# Patient Record
Sex: Male | Born: 1950
Health system: Southern US, Community
[De-identification: ages and names within clinical notes are randomized; demographics above are authoritative.]

## PROBLEM LIST (undated history)

## (undated) DIAGNOSIS — F32A Depression, unspecified: Secondary | ICD-10-CM

## (undated) DIAGNOSIS — I714 Abdominal aortic aneurysm, without rupture, unspecified: Secondary | ICD-10-CM

## (undated) DIAGNOSIS — M25519 Pain in unspecified shoulder: Secondary | ICD-10-CM

## (undated) DIAGNOSIS — K222 Esophageal obstruction: Secondary | ICD-10-CM

## (undated) DIAGNOSIS — I10 Essential (primary) hypertension: Secondary | ICD-10-CM

## (undated) DIAGNOSIS — R413 Other amnesia: Secondary | ICD-10-CM

## (undated) DIAGNOSIS — I712 Thoracic aortic aneurysm, without rupture, unspecified: Secondary | ICD-10-CM

## (undated) DIAGNOSIS — F4310 Post-traumatic stress disorder, unspecified: Secondary | ICD-10-CM

## (undated) DIAGNOSIS — R42 Dizziness and giddiness: Secondary | ICD-10-CM

## (undated) DIAGNOSIS — F482 Pseudobulbar affect: Secondary | ICD-10-CM

## (undated) DIAGNOSIS — G8929 Other chronic pain: Secondary | ICD-10-CM

## (undated) DIAGNOSIS — F329 Major depressive disorder, single episode, unspecified: Secondary | ICD-10-CM

## (undated) DIAGNOSIS — R0602 Shortness of breath: Secondary | ICD-10-CM

## (undated) DIAGNOSIS — E041 Nontoxic single thyroid nodule: Secondary | ICD-10-CM

## (undated) DIAGNOSIS — R2689 Other abnormalities of gait and mobility: Secondary | ICD-10-CM

## (undated) DIAGNOSIS — G5600 Carpal tunnel syndrome, unspecified upper limb: Secondary | ICD-10-CM

## (undated) DIAGNOSIS — Z8679 Personal history of other diseases of the circulatory system: Secondary | ICD-10-CM

## (undated) DIAGNOSIS — F419 Anxiety disorder, unspecified: Secondary | ICD-10-CM

## (undated) DIAGNOSIS — R51 Headache: Secondary | ICD-10-CM

## (undated) DIAGNOSIS — K922 Gastrointestinal hemorrhage, unspecified: Secondary | ICD-10-CM

## (undated) DIAGNOSIS — G4730 Sleep apnea, unspecified: Secondary | ICD-10-CM

## (undated) DIAGNOSIS — F0781 Postconcussional syndrome: Secondary | ICD-10-CM

## (undated) DIAGNOSIS — R200 Anesthesia of skin: Secondary | ICD-10-CM

## (undated) DIAGNOSIS — J189 Pneumonia, unspecified organism: Secondary | ICD-10-CM

## (undated) DIAGNOSIS — M1990 Unspecified osteoarthritis, unspecified site: Secondary | ICD-10-CM

## (undated) DIAGNOSIS — J449 Chronic obstructive pulmonary disease, unspecified: Secondary | ICD-10-CM

## (undated) DIAGNOSIS — Q211 Atrial septal defect: Secondary | ICD-10-CM

## (undated) DIAGNOSIS — R32 Unspecified urinary incontinence: Secondary | ICD-10-CM

## (undated) DIAGNOSIS — I639 Cerebral infarction, unspecified: Secondary | ICD-10-CM

## (undated) DIAGNOSIS — R202 Paresthesia of skin: Secondary | ICD-10-CM

## (undated) HISTORY — PX: CARDIAC SURGERY: SHX584

## (undated) HISTORY — PX: CARDIAC CATHETERIZATION: SHX172

## (undated) HISTORY — PX: OTHER SURGICAL HISTORY: SHX169

## (undated) HISTORY — DX: Thoracic aortic aneurysm, without rupture, unspecified: I71.20

## (undated) HISTORY — DX: Thoracic aortic aneurysm, without rupture: I71.2

## (undated) HISTORY — PX: DG GALL BLADDER: HXRAD326

---

## 1976-02-18 HISTORY — PX: VASECTOMY: SHX75

## 2000-07-14 ENCOUNTER — Emergency Department (HOSPITAL_COMMUNITY): Admission: EM | Admit: 2000-07-14 | Discharge: 2000-07-14 | Payer: Self-pay | Admitting: Emergency Medicine

## 2001-06-11 ENCOUNTER — Ambulatory Visit (HOSPITAL_COMMUNITY): Admission: RE | Admit: 2001-06-11 | Discharge: 2001-06-11 | Payer: Self-pay | Admitting: Internal Medicine

## 2001-06-21 ENCOUNTER — Ambulatory Visit (HOSPITAL_COMMUNITY): Admission: RE | Admit: 2001-06-21 | Discharge: 2001-06-21 | Payer: Self-pay | Admitting: Pulmonary Disease

## 2001-08-05 ENCOUNTER — Emergency Department (HOSPITAL_COMMUNITY): Admission: EM | Admit: 2001-08-05 | Discharge: 2001-08-05 | Payer: Self-pay | Admitting: Emergency Medicine

## 2001-12-28 ENCOUNTER — Ambulatory Visit (HOSPITAL_COMMUNITY): Admission: RE | Admit: 2001-12-28 | Discharge: 2001-12-28 | Payer: Self-pay | Admitting: Pulmonary Disease

## 2002-05-27 ENCOUNTER — Emergency Department (HOSPITAL_COMMUNITY): Admission: EM | Admit: 2002-05-27 | Discharge: 2002-05-27 | Payer: Self-pay | Admitting: Emergency Medicine

## 2002-05-27 ENCOUNTER — Encounter: Payer: Self-pay | Admitting: Emergency Medicine

## 2003-04-04 ENCOUNTER — Ambulatory Visit (HOSPITAL_COMMUNITY): Admission: RE | Admit: 2003-04-04 | Discharge: 2003-04-04 | Payer: Self-pay | Admitting: Pulmonary Disease

## 2003-04-14 ENCOUNTER — Ambulatory Visit (HOSPITAL_COMMUNITY): Admission: RE | Admit: 2003-04-14 | Discharge: 2003-04-14 | Payer: Self-pay | Admitting: *Deleted

## 2003-10-18 ENCOUNTER — Ambulatory Visit (HOSPITAL_COMMUNITY): Admission: RE | Admit: 2003-10-18 | Discharge: 2003-10-18 | Payer: Self-pay | Admitting: Unknown Physician Specialty

## 2004-03-04 ENCOUNTER — Ambulatory Visit (HOSPITAL_COMMUNITY): Admission: RE | Admit: 2004-03-04 | Discharge: 2004-03-04 | Payer: Self-pay | Admitting: Pulmonary Disease

## 2004-10-01 ENCOUNTER — Encounter: Payer: Self-pay | Admitting: Cardiology

## 2004-10-01 ENCOUNTER — Ambulatory Visit: Payer: Self-pay | Admitting: Cardiology

## 2004-10-01 ENCOUNTER — Inpatient Hospital Stay (HOSPITAL_COMMUNITY): Admission: AD | Admit: 2004-10-01 | Discharge: 2004-10-02 | Payer: Self-pay | Admitting: Cardiovascular Disease

## 2005-03-21 ENCOUNTER — Emergency Department (HOSPITAL_COMMUNITY): Admission: EM | Admit: 2005-03-21 | Discharge: 2005-03-21 | Payer: Self-pay | Admitting: Emergency Medicine

## 2005-03-21 ENCOUNTER — Inpatient Hospital Stay (HOSPITAL_COMMUNITY): Admission: RE | Admit: 2005-03-21 | Discharge: 2005-03-25 | Payer: Self-pay | Admitting: Psychiatry

## 2005-03-22 ENCOUNTER — Ambulatory Visit: Payer: Self-pay | Admitting: Psychiatry

## 2005-05-02 ENCOUNTER — Ambulatory Visit (HOSPITAL_COMMUNITY): Payer: Self-pay | Admitting: Psychology

## 2005-05-22 ENCOUNTER — Ambulatory Visit (HOSPITAL_COMMUNITY): Payer: Self-pay | Admitting: Psychology

## 2005-05-29 ENCOUNTER — Ambulatory Visit (HOSPITAL_COMMUNITY): Payer: Self-pay | Admitting: Psychology

## 2005-06-05 ENCOUNTER — Ambulatory Visit (HOSPITAL_COMMUNITY): Payer: Self-pay | Admitting: Psychology

## 2005-06-06 ENCOUNTER — Ambulatory Visit (HOSPITAL_COMMUNITY): Payer: Self-pay | Admitting: Psychiatry

## 2005-06-12 ENCOUNTER — Ambulatory Visit (HOSPITAL_COMMUNITY): Payer: Self-pay | Admitting: Psychology

## 2005-06-26 ENCOUNTER — Ambulatory Visit (HOSPITAL_COMMUNITY): Payer: Self-pay | Admitting: Psychology

## 2005-07-09 ENCOUNTER — Emergency Department (HOSPITAL_COMMUNITY): Admission: EM | Admit: 2005-07-09 | Discharge: 2005-07-09 | Payer: Self-pay | Admitting: Emergency Medicine

## 2005-07-10 ENCOUNTER — Ambulatory Visit (HOSPITAL_COMMUNITY): Payer: Self-pay | Admitting: Psychology

## 2005-07-16 ENCOUNTER — Ambulatory Visit (HOSPITAL_COMMUNITY): Payer: Self-pay | Admitting: Psychology

## 2005-07-21 ENCOUNTER — Emergency Department (HOSPITAL_COMMUNITY): Admission: EM | Admit: 2005-07-21 | Discharge: 2005-07-21 | Payer: Self-pay | Admitting: Emergency Medicine

## 2005-07-22 ENCOUNTER — Ambulatory Visit (HOSPITAL_COMMUNITY): Payer: Self-pay | Admitting: Psychology

## 2005-08-01 ENCOUNTER — Ambulatory Visit (HOSPITAL_COMMUNITY): Payer: Self-pay | Admitting: Psychology

## 2005-08-07 ENCOUNTER — Ambulatory Visit (HOSPITAL_COMMUNITY): Payer: Self-pay | Admitting: Psychology

## 2005-08-29 ENCOUNTER — Ambulatory Visit (HOSPITAL_COMMUNITY): Payer: Self-pay | Admitting: Psychiatry

## 2005-09-03 ENCOUNTER — Ambulatory Visit (HOSPITAL_COMMUNITY): Payer: Self-pay | Admitting: Psychology

## 2005-09-24 ENCOUNTER — Ambulatory Visit (HOSPITAL_COMMUNITY): Payer: Self-pay | Admitting: Psychology

## 2005-10-14 ENCOUNTER — Ambulatory Visit (HOSPITAL_COMMUNITY): Payer: Self-pay | Admitting: Psychology

## 2005-10-23 ENCOUNTER — Ambulatory Visit (HOSPITAL_COMMUNITY): Payer: Self-pay | Admitting: Psychology

## 2005-11-24 ENCOUNTER — Ambulatory Visit (HOSPITAL_COMMUNITY): Payer: Self-pay | Admitting: Psychology

## 2005-12-10 ENCOUNTER — Ambulatory Visit (HOSPITAL_COMMUNITY): Payer: Self-pay | Admitting: Psychology

## 2006-01-06 ENCOUNTER — Ambulatory Visit (HOSPITAL_COMMUNITY): Payer: Self-pay | Admitting: Psychology

## 2006-03-05 ENCOUNTER — Ambulatory Visit (HOSPITAL_COMMUNITY): Payer: Self-pay | Admitting: Psychology

## 2006-03-25 ENCOUNTER — Ambulatory Visit: Payer: Self-pay | Admitting: Orthopedic Surgery

## 2006-03-26 ENCOUNTER — Ambulatory Visit (HOSPITAL_COMMUNITY): Admission: RE | Admit: 2006-03-26 | Discharge: 2006-03-26 | Payer: Self-pay | Admitting: Orthopedic Surgery

## 2006-03-27 ENCOUNTER — Ambulatory Visit (HOSPITAL_COMMUNITY): Payer: Self-pay | Admitting: Psychiatry

## 2006-07-24 ENCOUNTER — Ambulatory Visit: Payer: Self-pay | Admitting: Internal Medicine

## 2006-07-24 ENCOUNTER — Encounter (INDEPENDENT_AMBULATORY_CARE_PROVIDER_SITE_OTHER): Payer: Self-pay | Admitting: Internal Medicine

## 2006-07-24 ENCOUNTER — Ambulatory Visit (HOSPITAL_COMMUNITY): Admission: RE | Admit: 2006-07-24 | Discharge: 2006-07-24 | Payer: Self-pay | Admitting: Internal Medicine

## 2006-07-24 HISTORY — PX: COLONOSCOPY: SHX174

## 2007-01-04 ENCOUNTER — Inpatient Hospital Stay (HOSPITAL_COMMUNITY): Admission: EM | Admit: 2007-01-04 | Discharge: 2007-01-06 | Payer: Self-pay | Admitting: Emergency Medicine

## 2007-01-04 ENCOUNTER — Ambulatory Visit: Payer: Self-pay | Admitting: Cardiology

## 2007-01-05 ENCOUNTER — Encounter (INDEPENDENT_AMBULATORY_CARE_PROVIDER_SITE_OTHER): Payer: Self-pay | Admitting: Pulmonary Disease

## 2007-02-18 DIAGNOSIS — K922 Gastrointestinal hemorrhage, unspecified: Secondary | ICD-10-CM

## 2007-02-18 HISTORY — DX: Gastrointestinal hemorrhage, unspecified: K92.2

## 2007-03-08 ENCOUNTER — Ambulatory Visit (HOSPITAL_COMMUNITY): Admission: RE | Admit: 2007-03-08 | Discharge: 2007-03-08 | Payer: Self-pay | Admitting: Pulmonary Disease

## 2007-03-15 ENCOUNTER — Ambulatory Visit (HOSPITAL_COMMUNITY): Admission: RE | Admit: 2007-03-15 | Discharge: 2007-03-15 | Payer: Self-pay | Admitting: Pulmonary Disease

## 2007-03-15 ENCOUNTER — Encounter (INDEPENDENT_AMBULATORY_CARE_PROVIDER_SITE_OTHER): Payer: Self-pay | Admitting: Diagnostic Radiology

## 2007-05-16 ENCOUNTER — Emergency Department (HOSPITAL_COMMUNITY): Admission: EM | Admit: 2007-05-16 | Discharge: 2007-05-16 | Payer: Self-pay | Admitting: Emergency Medicine

## 2007-08-12 ENCOUNTER — Ambulatory Visit (HOSPITAL_COMMUNITY): Admission: RE | Admit: 2007-08-12 | Discharge: 2007-08-12 | Payer: Self-pay | Admitting: Endocrinology

## 2007-10-15 ENCOUNTER — Encounter: Payer: Self-pay | Admitting: Internal Medicine

## 2007-10-15 ENCOUNTER — Ambulatory Visit: Payer: Self-pay | Admitting: Internal Medicine

## 2007-10-15 ENCOUNTER — Ambulatory Visit (HOSPITAL_COMMUNITY): Admission: RE | Admit: 2007-10-15 | Discharge: 2007-10-15 | Payer: Self-pay | Admitting: Internal Medicine

## 2007-10-15 HISTORY — PX: COLONOSCOPY: SHX174

## 2007-11-09 ENCOUNTER — Ambulatory Visit: Payer: Self-pay | Admitting: Internal Medicine

## 2007-12-15 ENCOUNTER — Ambulatory Visit (HOSPITAL_COMMUNITY): Admission: RE | Admit: 2007-12-15 | Discharge: 2007-12-15 | Payer: Self-pay | Admitting: Orthopedic Surgery

## 2007-12-20 ENCOUNTER — Ambulatory Visit: Payer: Self-pay | Admitting: Orthopedic Surgery

## 2007-12-20 DIAGNOSIS — M4712 Other spondylosis with myelopathy, cervical region: Secondary | ICD-10-CM | POA: Insufficient documentation

## 2007-12-21 ENCOUNTER — Telehealth: Payer: Self-pay | Admitting: Orthopedic Surgery

## 2007-12-22 ENCOUNTER — Telehealth: Payer: Self-pay | Admitting: Orthopedic Surgery

## 2007-12-23 ENCOUNTER — Telehealth: Payer: Self-pay | Admitting: Orthopedic Surgery

## 2007-12-27 ENCOUNTER — Telehealth: Payer: Self-pay | Admitting: Orthopedic Surgery

## 2007-12-29 ENCOUNTER — Telehealth: Payer: Self-pay | Admitting: Orthopedic Surgery

## 2008-01-03 ENCOUNTER — Encounter: Admission: RE | Admit: 2008-01-03 | Discharge: 2008-01-03 | Payer: Self-pay | Admitting: Orthopedic Surgery

## 2008-01-28 ENCOUNTER — Encounter: Admission: RE | Admit: 2008-01-28 | Discharge: 2008-01-28 | Payer: Self-pay | Admitting: Orthopedic Surgery

## 2008-02-07 ENCOUNTER — Ambulatory Visit (HOSPITAL_COMMUNITY): Admission: RE | Admit: 2008-02-07 | Discharge: 2008-02-07 | Payer: Self-pay | Admitting: Endocrinology

## 2008-02-14 ENCOUNTER — Inpatient Hospital Stay (HOSPITAL_COMMUNITY): Admission: EM | Admit: 2008-02-14 | Discharge: 2008-02-16 | Payer: Self-pay | Admitting: Emergency Medicine

## 2008-02-14 ENCOUNTER — Ambulatory Visit: Payer: Self-pay | Admitting: Cardiology

## 2008-02-14 ENCOUNTER — Encounter: Payer: Self-pay | Admitting: Emergency Medicine

## 2008-02-15 ENCOUNTER — Encounter (INDEPENDENT_AMBULATORY_CARE_PROVIDER_SITE_OTHER): Payer: Self-pay | Admitting: Emergency Medicine

## 2008-02-18 DIAGNOSIS — Q211 Atrial septal defect: Secondary | ICD-10-CM

## 2008-02-18 DIAGNOSIS — Q2112 Patent foramen ovale: Secondary | ICD-10-CM

## 2008-02-18 HISTORY — DX: Atrial septal defect: Q21.1

## 2008-02-18 HISTORY — PX: CARDIAC CATHETERIZATION: SHX172

## 2008-02-18 HISTORY — PX: OTHER SURGICAL HISTORY: SHX169

## 2008-02-18 HISTORY — DX: Patent foramen ovale: Q21.12

## 2008-03-16 ENCOUNTER — Encounter (INDEPENDENT_AMBULATORY_CARE_PROVIDER_SITE_OTHER): Payer: Self-pay | Admitting: Pulmonary Disease

## 2008-03-16 ENCOUNTER — Ambulatory Visit: Payer: Self-pay | Admitting: Surgery

## 2008-03-16 ENCOUNTER — Ambulatory Visit: Admission: RE | Admit: 2008-03-16 | Discharge: 2008-03-16 | Payer: Self-pay | Admitting: Pulmonary Disease

## 2008-04-03 ENCOUNTER — Ambulatory Visit: Payer: Self-pay | Admitting: Cardiovascular Disease

## 2008-04-28 ENCOUNTER — Ambulatory Visit (HOSPITAL_COMMUNITY): Admission: RE | Admit: 2008-04-28 | Discharge: 2008-04-28 | Payer: Self-pay | Admitting: Cardiology

## 2008-04-28 ENCOUNTER — Encounter (INDEPENDENT_AMBULATORY_CARE_PROVIDER_SITE_OTHER): Payer: Self-pay | Admitting: Neurology

## 2008-06-07 DIAGNOSIS — G43909 Migraine, unspecified, not intractable, without status migrainosus: Secondary | ICD-10-CM | POA: Insufficient documentation

## 2008-06-07 DIAGNOSIS — F3289 Other specified depressive episodes: Secondary | ICD-10-CM | POA: Insufficient documentation

## 2008-06-07 DIAGNOSIS — F4310 Post-traumatic stress disorder, unspecified: Secondary | ICD-10-CM | POA: Insufficient documentation

## 2008-06-07 DIAGNOSIS — E669 Obesity, unspecified: Secondary | ICD-10-CM | POA: Insufficient documentation

## 2008-06-07 DIAGNOSIS — F329 Major depressive disorder, single episode, unspecified: Secondary | ICD-10-CM | POA: Insufficient documentation

## 2008-06-07 DIAGNOSIS — E785 Hyperlipidemia, unspecified: Secondary | ICD-10-CM | POA: Insufficient documentation

## 2008-06-07 DIAGNOSIS — G5600 Carpal tunnel syndrome, unspecified upper limb: Secondary | ICD-10-CM | POA: Insufficient documentation

## 2008-06-07 DIAGNOSIS — I6350 Cerebral infarction due to unspecified occlusion or stenosis of unspecified cerebral artery: Secondary | ICD-10-CM | POA: Insufficient documentation

## 2008-06-07 DIAGNOSIS — I10 Essential (primary) hypertension: Secondary | ICD-10-CM | POA: Insufficient documentation

## 2008-06-07 DIAGNOSIS — F429 Obsessive-compulsive disorder, unspecified: Secondary | ICD-10-CM | POA: Insufficient documentation

## 2008-06-07 DIAGNOSIS — F528 Other sexual dysfunction not due to a substance or known physiological condition: Secondary | ICD-10-CM | POA: Insufficient documentation

## 2008-06-12 ENCOUNTER — Ambulatory Visit: Payer: Self-pay | Admitting: Cardiovascular Disease

## 2008-06-12 DIAGNOSIS — Q2111 Secundum atrial septal defect: Secondary | ICD-10-CM | POA: Insufficient documentation

## 2008-06-12 DIAGNOSIS — Q211 Atrial septal defect: Secondary | ICD-10-CM | POA: Insufficient documentation

## 2008-06-13 ENCOUNTER — Encounter: Payer: Self-pay | Admitting: Cardiovascular Disease

## 2008-06-19 ENCOUNTER — Encounter: Payer: Self-pay | Admitting: Emergency Medicine

## 2008-06-19 ENCOUNTER — Inpatient Hospital Stay (HOSPITAL_COMMUNITY): Admission: EM | Admit: 2008-06-19 | Discharge: 2008-06-21 | Payer: Self-pay | Admitting: Pediatrics

## 2008-07-11 ENCOUNTER — Encounter: Payer: Self-pay | Admitting: Cardiovascular Disease

## 2008-07-24 ENCOUNTER — Encounter: Payer: Self-pay | Admitting: Cardiovascular Disease

## 2008-08-01 ENCOUNTER — Encounter (HOSPITAL_COMMUNITY): Admission: RE | Admit: 2008-08-01 | Discharge: 2008-08-31 | Payer: Self-pay | Admitting: Neurology

## 2008-08-23 ENCOUNTER — Encounter: Payer: Self-pay | Admitting: Cardiovascular Disease

## 2008-09-05 ENCOUNTER — Encounter (HOSPITAL_COMMUNITY): Admission: RE | Admit: 2008-09-05 | Discharge: 2008-10-05 | Payer: Self-pay | Admitting: Neurology

## 2008-09-12 ENCOUNTER — Encounter: Payer: Self-pay | Admitting: Cardiovascular Disease

## 2008-09-14 ENCOUNTER — Encounter: Payer: Self-pay | Admitting: Cardiovascular Disease

## 2008-10-20 ENCOUNTER — Ambulatory Visit: Admission: RE | Admit: 2008-10-20 | Discharge: 2008-10-20 | Payer: Self-pay | Admitting: Pulmonary Disease

## 2008-11-01 ENCOUNTER — Encounter (HOSPITAL_COMMUNITY): Admission: RE | Admit: 2008-11-01 | Discharge: 2008-11-16 | Payer: Self-pay | Admitting: Pulmonary Disease

## 2008-11-20 ENCOUNTER — Encounter (HOSPITAL_COMMUNITY): Admission: RE | Admit: 2008-11-20 | Discharge: 2008-12-20 | Payer: Self-pay | Admitting: Pulmonary Disease

## 2008-12-22 ENCOUNTER — Ambulatory Visit (HOSPITAL_COMMUNITY): Admission: RE | Admit: 2008-12-22 | Discharge: 2008-12-22 | Payer: Self-pay | Admitting: Endocrinology

## 2009-01-23 ENCOUNTER — Encounter: Payer: Self-pay | Admitting: Cardiovascular Disease

## 2009-01-30 ENCOUNTER — Encounter: Payer: Self-pay | Admitting: Cardiovascular Disease

## 2009-02-05 ENCOUNTER — Encounter: Payer: Self-pay | Admitting: Cardiovascular Disease

## 2009-02-07 ENCOUNTER — Encounter: Payer: Self-pay | Admitting: Cardiovascular Disease

## 2009-03-15 ENCOUNTER — Encounter: Payer: Self-pay | Admitting: Cardiovascular Disease

## 2009-04-23 ENCOUNTER — Emergency Department (HOSPITAL_COMMUNITY): Admission: EM | Admit: 2009-04-23 | Discharge: 2009-04-23 | Payer: Self-pay | Admitting: Emergency Medicine

## 2009-07-17 ENCOUNTER — Ambulatory Visit (HOSPITAL_COMMUNITY): Admission: RE | Admit: 2009-07-17 | Discharge: 2009-07-17 | Payer: Self-pay | Admitting: Endocrinology

## 2009-07-19 ENCOUNTER — Other Ambulatory Visit: Admission: RE | Admit: 2009-07-19 | Discharge: 2009-07-19 | Payer: Self-pay | Admitting: Interventional Radiology

## 2009-07-19 ENCOUNTER — Encounter: Admission: RE | Admit: 2009-07-19 | Discharge: 2009-07-19 | Payer: Self-pay | Admitting: Endocrinology

## 2009-08-21 ENCOUNTER — Encounter: Payer: Self-pay | Admitting: Cardiovascular Disease

## 2009-09-17 ENCOUNTER — Encounter: Payer: Self-pay | Admitting: Cardiovascular Disease

## 2009-10-18 ENCOUNTER — Observation Stay (HOSPITAL_COMMUNITY): Admission: EM | Admit: 2009-10-18 | Discharge: 2009-10-20 | Payer: Self-pay | Admitting: Emergency Medicine

## 2010-02-26 ENCOUNTER — Encounter: Payer: Self-pay | Admitting: Cardiovascular Disease

## 2010-03-10 ENCOUNTER — Encounter: Payer: Self-pay | Admitting: Orthopedic Surgery

## 2010-03-19 NOTE — Letter (Signed)
Summary: [facility] Office Note   [facility] Neurology Office Note   Imported By: Roderic Ovens 09/25/2009 12:48:14  _____________________________________________________________________  External Attachment:    Type:   Image     Comment:   External Document

## 2010-03-19 NOTE — Miscellaneous (Signed)
Summary: Clinic Note Cardiovascular  Clinic Note Cardiovascular   Imported By: Kassie Mends 11/07/2008 10:16:30  _____________________________________________________________________  External Attachment:    Type:   Image     Comment:   External Document

## 2010-03-19 NOTE — Letter (Signed)
Summary: [facility] Clinic Note  [facility] Note   Imported By: Roderic Ovens 04/03/2009 14:01:53  _____________________________________________________________________  External Attachment:    Type:   Image     Comment:   External Document

## 2010-03-19 NOTE — Progress Notes (Signed)
Summary: Neurolgy Interval Note  Neurolgy Interval Note   Imported By: Harlon Flor 01/30/2009 09:39:11  _____________________________________________________________________  External Attachment:    Type:   Image     Comment:   External Document

## 2010-03-19 NOTE — Progress Notes (Signed)
Summary: Patient called with several questions  Phone Note Call from Patient   Caller: Patient Call For: Dr. Romeo Apple Summary of Call: Patient called re: (1) ESI appt, which he cancelled for today, due to illness (flu); states has not rescheduled yet     (2)wanted to relay that he has not yet started taking steroid dose pack, due to illness     (3)asking if Dr Romeo Apple can write a letter excusing him from the H1N1 vaccine for [facility] employee health to consider exemption  DR Areebah Meinders Please advise on above.     (4)His [facility] appointment information which I had left him a msg about - he states he is scheduled to work 11/25/09PM. I will follow up with him on this matter Initial call taken by: Cammie Sickle,  December 27, 2007 10:29 AM  Follow-up for Phone Call        must ask primary care for that   (fyi: we do not handle any medical issues)  Follow-up by: Fuller Canada MD,  December 27, 2007 1:09 PM  Additional Follow-up for Phone Call Additional follow up Details #1::        I lft msg's as follows: Pt's home and pt's work# [phone] [facility] re: resched of pt's appt w/Dr Channing Mutters Additional Follow-up by: Cammie Sickle,  December 27, 2007 4:15 PM    Additional Follow-up for Phone Call Additional follow up Details #2::    Pt returned call. Advised re: fol up w/PCP re: medical issues.    RE:  [facility] neuro appt that pt states needs to re-sched:  Per Jasmine December, she will give to Dr Roy's nurse, who will get back w/us about another appointment.  Patient informed. Follow-up by: Cammie Sickle,  December 28, 2007 10:25 AM

## 2010-03-19 NOTE — Consult Note (Signed)
Summary: [facility] - NEUROLOGY  [facility]   Imported By: Marylou Mccoy 02/28/2009 18:39:09  _____________________________________________________________________  External Attachment:    Type:   Image     Comment:   External Document

## 2010-03-19 NOTE — Progress Notes (Signed)
Summary: question about starting the predisone  Phone Note Call from Patient   Summary of Call: Rocky Link Luft's (2051-01-24) wife, Clydie Braun called and said that Rocky Link started with a sore throat and very congested last night, not running a fever yet.  Rocky Link has not started the predisone yet and did not know if. he should start it now while he is so congested.  Also, schedueled for the injections at [facility] Monday 12/27/07.  Do you want him to start the predisone and do you think he should reschedule the first injection? His # is [phone] Initial call taken by: Jacklynn Ganong,  December 22, 2007 10:51 AM  Follow-up for Phone Call        take the prednisone and doesnt need to reschedule the injection  Follow-up by: Fuller Canada MD,  December 22, 2007 11:52 AM  Additional Follow-up for Phone Call Additional follow up Details #1::        Phone Call Completed Additional Follow-up by: Jacklynn Ganong,  December 22, 2007 2:02 PM

## 2010-03-19 NOTE — Letter (Signed)
Summary: [facility] Interval Note   [facility] Neurology Interval Note   Imported By: Roderic Ovens 08/31/2009 12:58:10  _____________________________________________________________________  External Attachment:    Type:   Image     Comment:   External Document

## 2010-03-19 NOTE — Letter (Signed)
Summary: [facility] Office Visit  [facility] Office Visit   Imported By: Roderic Ovens 03/13/2009 14:33:06  _____________________________________________________________________  External Attachment:    Type:   Image     Comment:   External Document

## 2010-03-19 NOTE — Letter (Signed)
Summary: [facility]  [facility]   Imported By: Marylou Mccoy 02/28/2009 18:41:12  _____________________________________________________________________  External Attachment:    Type:   Image     Comment:   External Document

## 2010-03-19 NOTE — Assessment & Plan Note (Signed)
Summary: pt has had some testing done and wants to go over all testing...   CC:   pt comlians of fatigue dizziness and pt recently had a stroke pt states dr Eden Emms did an cath in his past.  History of Present Illness: [name] is seen today as a second opinion regarding multiple cerebral infarcts and a patent foramen ovale.  It took me quite a while to review records from Dr. Pearlean Brownie he had a right occipital and bilateral cerebellar infarction February of 2009.  He had had a previous history in November of 2000 date of her lobe infarct.  He has hypertension hyperlipidemia.  Transthoracic echo showed a normal EF with no significant valvular heart disease or source of embolus.  Transcranial Doppler study performed 2 2610 was reviewed.  The TCD bubble study was positive with a large intracardiac right-to-left shunt. this was accentuated by Valsalva.  He subsequently was referred for transesophageal echocardiography by Dr. Jacinto Halim.  the patient was quite upset regarding this procedure.  He said there were conflicting views regarding it.  He also said the report had to be redone and that Dr. Karsten Fells actually perform the procedure.  He said that he was told that there was a hole in his heart that period between 6 mm and 1.5 cm.  Actually looked at the transesophageal echocardiogram that was performed on April 28, 2008.  The patient and his wife eat with me.  The patient has a relatively small patent foramen ovale.  It is clearly present by color flow.  The study is positive for right to left shunting.  There is no other cardiac pathology.  There is no other source of embolus.  I could not measure on-screen calipers but it appeared to be only 0.5-0.7 mm.  There is clearly no ASD.  However in the setting of multiple random cerebral infarction TIAs despite being on aspirin and Plavix, I would agree that the patient probably should have his patent foramen closed percutaneously.  The patient does not feel comfortable with Dr.  Jacinto Halim.  my understanding that he is leaving [facility] anyway. in the [employer] we usually referred these people to [facility].  Dr. Earma Reading and Romeo Apple have over 10 years experience with occluder devices.  I don't think this has to be done under a research protocol.  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 1 Tab By Mouth Once Daily 3)  Aggrenox 25-200 Mg Xr12h-Cap (Aspirin-Dipyridamole) .Marland Kitchen.. 1 Tab By Mouth Two Times A Day 4)  Centrum Silver .Marland Kitchen.. 1 Tab By Mouth Once Daily 5)  Xanax .5 Mg .... As Needed  Allergies (verified): No Known Drug Allergies  Past History:  Past Medical History:    Current Problems:     HYPERTENSION (ICD-401.9)    CVA (ICD-434.91)    MIGRAINE, CHRONIC (ICD-346.90)    DEPRESSION (ICD-311)    CARPAL TUNNEL SYNDROME (ICD-354.0)    ERECTILE DYSFUNCTION (ICD-302.72)    OBESITY (ICD-278.00)    DYSLIPIDEMIA (ICD-272.4)    CERVICAL SPONDYLOSIS WITH  MYELOPATHY (ICD-721.1)    PTSD (ICD-309.81)    OBSESSIVE-COMPULSIVE DISORDER (ICD-300.3)    History of previous stroke.     history of CVA with left-sided mild residual symptoms. No deformities. Started having still on Plavix.    Gastrointestinal bleed in summer of 2009     He has had precancerous lesions in his colon.      (06/07/2008)  Past Surgical History:    Neck injections for spondylosis    colonoscopy     Cerebral angiogram performed at [facility]    3 cardiac caths    2   colonoscopies         precancerous polyps removed.  (06/07/2008)  Family History:    Mother died in her 53s of congestive heart failure.      Father died in his 69s of cerebrovascular disease.  Brother died in his      71s of myocardial infarction.  Another brother died in 57s of lung      cancer.  He has two sisters with cardiac disease.        (06/07/2008)  Social History:    He is a Building control surveyor at [employer].     Tobacco Use - No.     Alcohol Use - no     (06/07/2008)  Social History:    He is a Building control surveyor at [employer].     Tobacco Use - No.     Alcohol Use - no    Currently on disability      Review of Systems       Denies fever, malais, weight loss, blurry vision, decreased visual acuity, cough, sputum, SOB, hemoptysis, pleuritic pain, palpitaitons, heartburn, abdominal pain, melena, lower extremity edema, claudication, or rash.   Vital Signs:  Patient profile:   60 year old male Height:      73 inches Weight:      206 pounds Pulse rate:   57 / minute Resp:     14 per minute BP sitting:   128 / 70  (left arm)  Vitals Entered By: Kem Parkinson (June 12, 2008 11:53 AM)  Physical Exam  General:  Affect appropriate Healthy:  appears stated age HEENT: normal Neck supple with no adenopathy JVP normal no bruits no thyromegaly Lungs clear with no wheezing and good diaphragmatic motion Heart:  S1/S2 no murmur,rub, gallop or click PMI normal Abdomen: benighn, BS positve, no tenderness, no AAA no bruit.  No HSM or HJR Distal pulses intact with no bruits No edema Neuro non-focal Skin warm and dry    Impression & Recommendations:  Problem # 1:  PATENT FORAMEN OVALE (ICD-745.5) Reviewed records and TEE with patient.  Personally discussed case with [facility] and Dr. Earma Reading.  Continue ASA and Plavix.  Refer for percutaneous closure.    Problem # 2:  HYPERTENSION (ICD-401.9) Well controlled low sodium diet  Problem # 3:  DEPRESSION (ICD-311) Previous  psych evaluation with suicidal ideation.  Seems improved.  F/U Psychiatry  Problem # 4:  ERECTILE DYSFUNCTION (ICD-302.72) Small vessel disease with depression.  Followed by Dr. Juanetta Gosling.  No contraindication to viagra at this time  Other Orders: Misc. Referral (Misc. Ref)  Patient Instructions: 1)  Refer to [facility] for PFO closure  Appended Document: pt has had some testing done and wants to go over all testing... ECG:  Sinus bradycardia 57  Possible LAE. Borderline ECG

## 2010-04-10 NOTE — Letter (Signed)
Summary: [facility] Interval Note  [facility] - Neurology Interval Note   Imported By: Marylou Mccoy 04/01/2010 12:49:18  _____________________________________________________________________  External Attachment:    Type:   Image     Comment:   External Document

## 2010-05-02 LAB — DIFFERENTIAL
Basophils Absolute: 0 10*3/uL (ref 0.0–0.1)
Basophils Relative: 1 % (ref 0–1)
Eosinophils Absolute: 0.2 10*3/uL (ref 0.0–0.7)
Eosinophils Relative: 3 % (ref 0–5)
Lymphocytes Relative: 23 % (ref 12–46)
Lymphs Abs: 1.8 10*3/uL (ref 0.7–4.0)
Monocytes Absolute: 0.6 10*3/uL (ref 0.1–1.0)
Monocytes Relative: 8 % (ref 3–12)
Neutro Abs: 5.3 10*3/uL (ref 1.7–7.7)
Neutrophils Relative %: 66 % (ref 43–77)

## 2010-05-02 LAB — CBC
HCT: 44.5 % (ref 39.0–52.0)
Hemoglobin: 14.8 g/dL (ref 13.0–17.0)
MCH: 32.4 pg (ref 26.0–34.0)
MCHC: 33.3 g/dL (ref 30.0–36.0)
MCV: 97.4 fL (ref 78.0–100.0)
Platelets: 201 10*3/uL (ref 150–400)
RBC: 4.56 MIL/uL (ref 4.22–5.81)
RDW: 13.3 % (ref 11.5–15.5)
WBC: 8 10*3/uL (ref 4.0–10.5)

## 2010-05-02 LAB — CARDIAC PANEL(CRET KIN+CKTOT+MB+TROPI)
CK, MB: 2.3 ng/mL (ref 0.3–4.0)
CK, MB: 2.8 ng/mL (ref 0.3–4.0)
Relative Index: 2.3 (ref 0.0–2.5)
Relative Index: 2.5 (ref 0.0–2.5)
Total CK: 100 U/L (ref 7–232)
Total CK: 111 U/L (ref 7–232)
Troponin I: 0.03 ng/mL (ref 0.00–0.06)
Troponin I: 0.06 ng/mL (ref 0.00–0.06)

## 2010-05-02 LAB — COMPREHENSIVE METABOLIC PANEL
ALT: 34 U/L (ref 0–53)
AST: 28 U/L (ref 0–37)
Albumin: 4 g/dL (ref 3.5–5.2)
Alkaline Phosphatase: 64 U/L (ref 39–117)
BUN: 18 mg/dL (ref 6–23)
CO2: 27 mEq/L (ref 19–32)
Calcium: 9.6 mg/dL (ref 8.4–10.5)
Chloride: 108 mEq/L (ref 96–112)
Creatinine, Ser: 0.84 mg/dL (ref 0.4–1.5)
GFR calc Af Amer: >60 mL/min (ref >60)
GFR calc non Af Amer: >60 mL/min (ref >60)
Glucose, Bld: 121 mg/dL — ABNORMAL HIGH (ref 70–99)
Potassium: 4 mEq/L (ref 3.5–5.1)
Sodium: 141 mEq/L (ref 135–145)
Total Bilirubin: 0.9 mg/dL (ref 0.3–1.2)
Total Protein: 6.6 g/dL (ref 6.0–8.3)

## 2010-05-02 LAB — POCT CARDIAC MARKERS
CKMB, poc: 1.9 ng/mL (ref 1.0–8.0)
Myoglobin, poc: 91.2 ng/mL (ref 12–200)
Troponin i, poc: <0.05 ng/mL (ref 0.00–0.09)

## 2010-05-10 LAB — BASIC METABOLIC PANEL
BUN: 19 mg/dL (ref 6–23)
CO2: 27 mEq/L (ref 19–32)
Calcium: 9.3 mg/dL (ref 8.4–10.5)
Chloride: 106 mEq/L (ref 96–112)
Creatinine, Ser: 0.79 mg/dL (ref 0.4–1.5)
GFR calc Af Amer: >60 mL/min (ref >60)
GFR calc non Af Amer: >60 mL/min (ref >60)
Glucose, Bld: 111 mg/dL — ABNORMAL HIGH (ref 70–99)
Potassium: 4 mEq/L (ref 3.5–5.1)
Sodium: 140 mEq/L (ref 135–145)

## 2010-05-10 LAB — URINALYSIS, ROUTINE W REFLEX MICROSCOPIC
Bilirubin Urine: NEGATIVE
Glucose, UA: NEGATIVE mg/dL
Hgb urine dipstick: NEGATIVE
Ketones, ur: NEGATIVE mg/dL
Nitrite: NEGATIVE
Protein, ur: NEGATIVE mg/dL
Specific Gravity, Urine: 1.01 (ref 1.005–1.030)
Urobilinogen, UA: 0.2 mg/dL (ref 0.0–1.0)
pH: 6.5 (ref 5.0–8.0)

## 2010-05-10 LAB — DIFFERENTIAL
Basophils Absolute: 0 10*3/uL (ref 0.0–0.1)
Basophils Relative: 1 % (ref 0–1)
Eosinophils Absolute: 0.1 10*3/uL (ref 0.0–0.7)
Eosinophils Relative: 2 % (ref 0–5)
Lymphocytes Relative: 19 % (ref 12–46)
Lymphs Abs: 1.2 10*3/uL (ref 0.7–4.0)
Monocytes Absolute: 0.6 10*3/uL (ref 0.1–1.0)
Monocytes Relative: 10 % (ref 3–12)
Neutro Abs: 4.3 10*3/uL (ref 1.7–7.7)
Neutrophils Relative %: 69 % (ref 43–77)

## 2010-05-10 LAB — CBC
HCT: 42.5 % (ref 39.0–52.0)
Hemoglobin: 14.8 g/dL (ref 13.0–17.0)
MCHC: 34.7 g/dL (ref 30.0–36.0)
MCV: 97.1 fL (ref 78.0–100.0)
Platelets: 176 10*3/uL (ref 150–400)
RBC: 4.38 MIL/uL (ref 4.22–5.81)
RDW: 12.3 % (ref 11.5–15.5)
WBC: 6.2 10*3/uL (ref 4.0–10.5)

## 2010-05-10 LAB — PROTIME-INR
INR: 1.03 (ref 0.00–1.49)
Prothrombin Time: 13.4 seconds (ref 11.6–15.2)

## 2010-05-10 LAB — POCT CARDIAC MARKERS
CKMB, poc: <1 ng/mL — ABNORMAL LOW (ref 1.0–8.0)
Myoglobin, poc: 106 ng/mL (ref 12–200)
Troponin i, poc: <0.05 ng/mL (ref 0.00–0.09)

## 2010-05-28 LAB — CBC
HCT: 43.5 % (ref 39.0–52.0)
HCT: 41.4 % (ref 39.0–52.0)
Hemoglobin: 15.2 g/dL (ref 13.0–17.0)
Hemoglobin: 14.3 g/dL (ref 13.0–17.0)
MCHC: 34.9 g/dL (ref 30.0–36.0)
MCHC: 34.6 g/dL (ref 30.0–36.0)
MCV: 98.1 fL (ref 78.0–100.0)
MCV: 98.3 fL (ref 78.0–100.0)
Platelets: 212 10*3/uL (ref 150–400)
Platelets: 194 10*3/uL (ref 150–400)
RBC: 4.44 MIL/uL (ref 4.22–5.81)
RBC: 4.21 MIL/uL — ABNORMAL LOW (ref 4.22–5.81)
RDW: 12.6 % (ref 11.5–15.5)
RDW: 12.3 % (ref 11.5–15.5)
WBC: 6.5 10*3/uL (ref 4.0–10.5)
WBC: 5.9 10*3/uL (ref 4.0–10.5)

## 2010-05-28 LAB — COMPREHENSIVE METABOLIC PANEL
ALT: 32 U/L (ref 0–53)
ALT: 32 U/L (ref 0–53)
AST: 27 U/L (ref 0–37)
AST: 25 U/L (ref 0–37)
Albumin: 4.3 g/dL (ref 3.5–5.2)
Albumin: 3.8 g/dL (ref 3.5–5.2)
Alkaline Phosphatase: 63 U/L (ref 39–117)
Alkaline Phosphatase: 62 U/L (ref 39–117)
BUN: 21 mg/dL (ref 6–23)
BUN: 19 mg/dL (ref 6–23)
CO2: 27 mEq/L (ref 19–32)
CO2: 28 mEq/L (ref 19–32)
Calcium: 9.3 mg/dL (ref 8.4–10.5)
Calcium: 9.1 mg/dL (ref 8.4–10.5)
Chloride: 102 mEq/L (ref 96–112)
Chloride: 107 mEq/L (ref 96–112)
Creatinine, Ser: 0.79 mg/dL (ref 0.4–1.5)
Creatinine, Ser: 0.83 mg/dL (ref 0.4–1.5)
GFR calc Af Amer: >60 mL/min (ref >60)
GFR calc Af Amer: >60 mL/min (ref >60)
GFR calc non Af Amer: >60 mL/min (ref >60)
GFR calc non Af Amer: >60 mL/min (ref >60)
Glucose, Bld: 89 mg/dL (ref 70–99)
Glucose, Bld: 94 mg/dL (ref 70–99)
Potassium: 4.2 mEq/L (ref 3.5–5.1)
Potassium: 4 mEq/L (ref 3.5–5.1)
Sodium: 138 mEq/L (ref 135–145)
Sodium: 142 mEq/L (ref 135–145)
Total Bilirubin: 1.4 mg/dL — ABNORMAL HIGH (ref 0.3–1.2)
Total Bilirubin: 1.6 mg/dL — ABNORMAL HIGH (ref 0.3–1.2)
Total Protein: 6.8 g/dL (ref 6.0–8.3)
Total Protein: 6.1 g/dL (ref 6.0–8.3)

## 2010-05-28 LAB — LIPID PANEL
Cholesterol: 159 mg/dL (ref 0–200)
HDL: 53 mg/dL (ref >39)
LDL Cholesterol: 87 mg/dL (ref 0–99)
Total CHOL/HDL Ratio: 3 RATIO
Triglycerides: 97 mg/dL (ref <150)
VLDL: 19 mg/dL (ref 0–40)

## 2010-05-28 LAB — URINALYSIS, ROUTINE W REFLEX MICROSCOPIC
Bilirubin Urine: NEGATIVE
Glucose, UA: NEGATIVE mg/dL
Hgb urine dipstick: NEGATIVE
Ketones, ur: 15 mg/dL — AB
Nitrite: NEGATIVE
Protein, ur: NEGATIVE mg/dL
Specific Gravity, Urine: 1.016 (ref 1.005–1.030)
Urobilinogen, UA: 1 mg/dL (ref 0.0–1.0)
pH: 6 (ref 5.0–8.0)

## 2010-05-28 LAB — APTT
aPTT: 26 seconds (ref 24–37)
aPTT: 28 seconds (ref 24–37)

## 2010-05-28 LAB — POCT CARDIAC MARKERS
CKMB, poc: 1.1 ng/mL (ref 1.0–8.0)
Myoglobin, poc: 62.7 ng/mL (ref 12–200)
Troponin i, poc: <0.05 ng/mL (ref 0.00–0.09)

## 2010-05-28 LAB — DIFFERENTIAL
Basophils Absolute: 0 10*3/uL (ref 0.0–0.1)
Basophils Relative: 1 % (ref 0–1)
Eosinophils Absolute: 0.1 10*3/uL (ref 0.0–0.7)
Eosinophils Relative: 2 % (ref 0–5)
Lymphocytes Relative: 25 % (ref 12–46)
Lymphs Abs: 1.6 10*3/uL (ref 0.7–4.0)
Monocytes Absolute: 0.7 10*3/uL (ref 0.1–1.0)
Monocytes Relative: 10 % (ref 3–12)
Neutro Abs: 4 10*3/uL (ref 1.7–7.7)
Neutrophils Relative %: 63 % (ref 43–77)

## 2010-05-28 LAB — CK TOTAL AND CKMB (NOT AT ARMC)
CK, MB: 1.5 ng/mL (ref 0.3–4.0)
Relative Index: 0.8 (ref 0.0–2.5)
Total CK: 178 U/L (ref 7–232)

## 2010-05-28 LAB — PROTIME-INR
INR: 1 (ref 0.00–1.49)
INR: 1.1 (ref 0.00–1.49)
Prothrombin Time: 13.4 seconds (ref 11.6–15.2)
Prothrombin Time: 14.5 seconds (ref 11.6–15.2)

## 2010-05-28 LAB — URINE MICROSCOPIC-ADD ON

## 2010-05-28 LAB — GLUCOSE, CAPILLARY: Glucose-Capillary: 88 mg/dL (ref 70–99)

## 2010-05-28 LAB — TROPONIN I: Troponin I: 0.03 ng/mL (ref 0.00–0.06)

## 2010-07-01 ENCOUNTER — Emergency Department (HOSPITAL_COMMUNITY): Payer: No Typology Code available for payment source

## 2010-07-01 ENCOUNTER — Emergency Department (HOSPITAL_COMMUNITY)
Admission: EM | Admit: 2010-07-01 | Discharge: 2010-07-01 | Disposition: A | Discharge status: Home / Self Care | Payer: No Typology Code available for payment source | Attending: Emergency Medicine | Admitting: Emergency Medicine

## 2010-07-01 DIAGNOSIS — R911 Solitary pulmonary nodule: Secondary | ICD-10-CM | POA: Insufficient documentation

## 2010-07-01 DIAGNOSIS — M25579 Pain in unspecified ankle and joints of unspecified foot: Secondary | ICD-10-CM | POA: Insufficient documentation

## 2010-07-01 DIAGNOSIS — M545 Low back pain, unspecified: Secondary | ICD-10-CM | POA: Insufficient documentation

## 2010-07-01 DIAGNOSIS — R071 Chest pain on breathing: Secondary | ICD-10-CM | POA: Insufficient documentation

## 2010-07-01 DIAGNOSIS — R10816 Epigastric abdominal tenderness: Secondary | ICD-10-CM | POA: Insufficient documentation

## 2010-07-01 DIAGNOSIS — R109 Unspecified abdominal pain: Secondary | ICD-10-CM | POA: Insufficient documentation

## 2010-07-01 DIAGNOSIS — IMO0001 Reserved for inherently not codable concepts without codable children: Secondary | ICD-10-CM | POA: Insufficient documentation

## 2010-07-01 DIAGNOSIS — T07XXXA Unspecified multiple injuries, initial encounter: Secondary | ICD-10-CM | POA: Insufficient documentation

## 2010-07-01 DIAGNOSIS — M79609 Pain in unspecified limb: Secondary | ICD-10-CM | POA: Insufficient documentation

## 2010-07-01 DIAGNOSIS — M542 Cervicalgia: Secondary | ICD-10-CM | POA: Insufficient documentation

## 2010-07-01 LAB — DIFFERENTIAL
Basophils Absolute: 0 10*3/uL (ref 0.0–0.1)
Basophils Relative: 1 % (ref 0–1)
Eosinophils Absolute: 0.1 10*3/uL (ref 0.0–0.7)
Eosinophils Relative: 1 % (ref 0–5)
Lymphocytes Relative: 13 % (ref 12–46)
Lymphs Abs: 1 10*3/uL (ref 0.7–4.0)
Monocytes Absolute: 0.6 10*3/uL (ref 0.1–1.0)
Monocytes Relative: 7 % (ref 3–12)
Neutro Abs: 6.4 10*3/uL (ref 1.7–7.7)
Neutrophils Relative %: 79 % — ABNORMAL HIGH (ref 43–77)

## 2010-07-01 LAB — BASIC METABOLIC PANEL
BUN: 24 mg/dL — ABNORMAL HIGH (ref 6–23)
CO2: 30 mEq/L (ref 19–32)
Calcium: 9.9 mg/dL (ref 8.4–10.5)
Chloride: 104 mEq/L (ref 96–112)
Creatinine, Ser: 0.78 mg/dL (ref 0.4–1.5)
GFR calc Af Amer: >60 mL/min (ref >60)
GFR calc non Af Amer: >60 mL/min (ref >60)
Glucose, Bld: 112 mg/dL — ABNORMAL HIGH (ref 70–99)
Potassium: 4.4 mEq/L (ref 3.5–5.1)
Sodium: 139 mEq/L (ref 135–145)

## 2010-07-01 LAB — CBC
HCT: 42.2 % (ref 39.0–52.0)
Hemoglobin: 14.2 g/dL (ref 13.0–17.0)
MCH: 32.1 pg (ref 26.0–34.0)
MCHC: 33.6 g/dL (ref 30.0–36.0)
MCV: 95.5 fL (ref 78.0–100.0)
Platelets: 162 10*3/uL (ref 150–400)
RBC: 4.42 MIL/uL (ref 4.22–5.81)
RDW: 12.3 % (ref 11.5–15.5)
WBC: 8.1 10*3/uL (ref 4.0–10.5)

## 2010-07-01 MED ORDER — IOHEXOL 300 MG/ML  SOLN
100.0000 mL | Freq: Once | INTRAMUSCULAR | Status: AC | PRN
  Administered 2010-07-01: 100 mL via INTRAVENOUS

## 2010-07-02 NOTE — Group Therapy Note (Signed)
NAME:  RUEL, DIMMICK           ACCOUNT NO.:  [id number]   MEDICAL RECORD NO.:  [id number]          PATIENT TYPE:  INP   LOCATION:  A206                          FACILITY:  [facility]   PHYSICIAN:  Edward L. Juanetta Gosling, M.D.DATE OF BIRTH:  05-Jul-1950   DATE OF PROCEDURE:  DATE OF DISCHARGE:                                 PROGRESS NOTE   PROBLEM:  Stroke.   SUBJECTIVE:  Mr. Palardy has been admitted with left arm weakness and  has had a stroke documented by MRI.  This morning he says he thinks his  strength is a bit better.  He is otherwise about the same.  He still has  decreased grip strength in his left hand.  He does not have any facial  asymmetry.  His speech is fluent.  Dr. Ronal Fear help is noted and  appreciated.  He has speech PT and OT consultations scheduled.  He has  echocardiograms scheduled.  I am going to discuss with radiology whether  he needs a carotid Doppler since he had the previous MRA.      Edward L. Juanetta Gosling, M.D.  Electronically Signed     ELH/MEDQ  D:  01/05/2007  T:  01/05/2007  Job:  [id number]

## 2010-07-02 NOTE — H&P (Signed)
NAME:  [name], [name]           ACCOUNT NO.:  [id number]   MEDICAL RECORD NO.:  [id number]          PATIENT TYPE:  INP   LOCATION:  A206                          FACILITY:  [facility]   PHYSICIAN:  Edward L. Juanetta Gosling, M.D.DATE OF BIRTH:  [birth year]   DATE OF ADMISSION:  01/04/2007  DATE OF DISCHARGE:  LH                              HISTORY & PHYSICAL   REASON FOR ADMISSION:  Stroke.   HISTORY:  [name] is a 60 year old who came to the emergency room  because he had left-sided weakness.  This was mostly in his left arm.  He had a sense of some odd sensation around his face.  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  He has  had left upper extremity weakness.  He has had some problem with his  mouth.  His speech has been okay.  He has not had any swallowing  difficulties, and he has not had any seizures, etc.  He has had some  very minimal left lower extremity weakness.   PAST MEDICAL HISTORY:  1. Cardiac catheterization which was normal in 2006.  2. He has dyslipidemia.  3. Carpal tunnel syndrome.  4. He has had precancerous lesions in his colon.  5. He has depression/obsessive-compulsive disorder.  He has recently      been having more stress and has recently started back on Prozac,      which he had stopped some time ago.   SOCIAL HISTORY:  He lives at home with his wife.  He has three children.  He has about a 60 pack-year smoking history.  He drinks about two  alcoholic beverages every night.  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  He stopped smoking at least 10, maybe 15 years  ago now.   FAMILY HISTORY:  Mother died in her 56s with CHF, father in his 13s with  a CVA.  He had a brother who had  a heart attack in his 66s and died.  Another had lung cancer in his 42s and died.  Two sisters both have  cardiac disease.   PHYSICAL EXAMINATION:  GENERAL:  Showed a well-developed, well-nourished  male, who is in no acute distress now.  VITAL SIGNS:  Blood pressure 120/70, pulse of 76, respirations 16.  HEENT:  He does not have any carotid bruises.  His pupils do react.  He  has no facial asymmetry.  CHEST:  Clear.  HEART:  Regular.  ABDOMEN: Soft.  EXTREMITIES:  Showed no edema.  CENTRAL NERVOUS SYSTEM:  Showed that he has left hand weakness.  His  grip strength is definitely decreased, and much milder left lower  extremity weakness.   MRI shows a stroke.   ASSESSMENT:  He has a stroke.   PLAN:  To have him start Plavix.  He  is going to have PT, OT, and speech  consultation.  Continue with his other treatments, and follow.      Edward L. Juanetta Gosling, M.D.  Electronically Signed     ELH/MEDQ  D:  01/04/2007  T:  01/05/2007  Job:  [id number]

## 2010-07-02 NOTE — Discharge Summary (Signed)
NAME:  [name], [name]           ACCOUNT NO.:  [id number]   MEDICAL RECORD NO.:  [id number]          PATIENT TYPE:  INP   LOCATION:  A206                          FACILITY:  [facility]   PHYSICIAN:  Edward L. Juanetta Gosling, M.D.DATE OF BIRTH:  January 07, 1951   DATE OF ADMISSION:  01/04/2007  DATE OF DISCHARGE:  11/19/2008LH                               DISCHARGE SUMMARY   FINAL DISCHARGE DIAGNOSES:  1. Stroke.  2. Dyslipidemia.  3. History precancerous lesions of the colon.  4. Depression, obsessive-compulsive disorder.   HISTORY:  [name] is a 60 year old who came to the emergency room  because of left-sided weakness.  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 He came to work ready to start his day, noticed that he had  left arm weakness and an odd sensation around his face.  He came to the  emergency room, had a CT done which was negative.  Had an MRI which did  show a stroke, and he had clear evidence of weakness of his left arm.  His speech had been okay.  No swallowing difficulty.  No seizures.   His physical examination showed a well-developed, well-nourished male in  no acute distress.  Blood pressure 120/70, pulse 76, respirations 16.  He did not have carotid bruits.  Pupils were reactive.  No facial  asymmetry. His left hand was weak with decreased grip strength and much  milder left lower extremity weakness.  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   HOSPITAL COURSE:  He was started on Plavix and aspirin, had consultation  with Dr. Gerilyn Pilgrim, and improved.  By the time of discharge, his grip  strength was almost back to normal.  He felt much better.  He was  discharged home in good condition.  He is going to be on:  1. Plavix 75 mg daily.  2. Prozac 20 mg daily.  3. Aspirin 81 mg daily.  4. Xanax 0.5 mg as needed,  5. Centrum Silver daily.  6. Omega-3 supplement daily.  7. Lutin supplement daily.  8. B complex  vitamins daily.  9. Calcium citrate with D daily.  10.He is going to be on Zocor 40 mg daily.   He will follow up in my office in about 2 weeks.  I would like him to  follow up with Dr. Gerilyn Pilgrim as well.      Edward L. Juanetta Gosling, M.D.  Electronically Signed     ELH/MEDQ  D:  01/06/2007  T:  01/06/2007  Job:  [id number]

## 2010-07-02 NOTE — Op Note (Signed)
NAME:  [name], [name]           ACCOUNT NO.:  [id number]   MEDICAL RECORD NO.:  [id number]          PATIENT TYPE:  AMB   LOCATION:  DAY                           FACILITY:  [facility]   PHYSICIAN:  Lionel December, M.D.    DATE OF BIRTH:  23-Nov-1950   DATE OF PROCEDURE:  07/24/2006  DATE OF DISCHARGE:                               OPERATIVE REPORT   PROCEDURE:  Colonoscopy.   INDICATIONS:  The patient is 60 year old Caucasian male who had a  tubular adenoma removed from his colon.  Another polyp was coagulated.  He is returning for surveillance colonoscopy.  Tells me last week he had  tarry stools lasting for day and half. He did not experience any pain or  nausea, vomiting or postural symptoms.  He is on low-dose ASA.  He does  give history of peptic ulcer disease when he was in his 31s.  H pylori  status unknown.  We discussed about EGD but decided to hold off.   Procedure risks were reviewed the patient, informed consent was  obtained.  We did discuss about possibility of doing EGD in addition to  colonoscopy but decided to hold off on EGD.   Procedure risks were reviewed the patient, informed consent was  obtained.   MEDS FOR CONSCIOUS SEDATION:  Demerol 50 mg IV, Versed 6 mg IV.   FINDINGS:  Procedure performed in endoscopy suite.  The patient's vital  signs and O2 sat were monitored during procedure and remained stable.  The patient was placed in left lateral position and rectal examination  performed.  No abnormality noted external or digital exam.  Pentax  videoscope was placed rectum and advanced under vision into sigmoid  colon beyond.  He had lot of liquid stool which had to be suctioned out.  Scope was passed into cecum which was identified by appendiceal orifice  and ileocecal valve.  Pictures taken for the record.  As the scope was  withdrawn colonic mucosa was carefully examined.  There was a 3-4 mL  polyp at descending colon which was ablated via cold biopsy.  This  point  there was poor optics, lens could not be washed.  Therefore scope was  withdrawn.  Lens was cleaned and the scope was passed again into  descending colon.  There was another small polyp at sigmoid colon which  was ablated by cold biopsy.  Rectal mucosa was normal.  Scope was  retroflexed to examine anorectal junction and hemorrhoids noted below  the dentate line.  Endoscope was then withdrawn.  The patient tolerated  the procedure well.   FINAL DIAGNOSIS:  1. Two small polyps ablated via cold biopsy.  One from descending      colon and other one from sigmoid colon.  2. External hemorrhoids.   RECOMMENDATIONS:  Stent instructions given.  Next we will check his H&H  and H pylori serology today.  I have asked him to hold off his ASA for  two more weeks.  His hemoglobin is low, would consider EGD at some  point.      Lionel December, M.D.  Electronically Signed  NR/MEDQ  D:  07/24/2006  T:  07/24/2006  Job:  [id number]   cc:   Ramon Dredge L. Juanetta Gosling, M.D.  Fax: [phone]

## 2010-07-02 NOTE — Consult Note (Signed)
NAME:  [name], [name]           ACCOUNT NO.:  [id number]   MEDICAL RECORD NO.:  [id number]          PATIENT TYPE:  INP   LOCATION:  A206                          FACILITY:  [facility]   PHYSICIAN:  Kofi A. Gerilyn Pilgrim, M.D. DATE OF BIRTH:  10/29/1950   DATE OF CONSULTATION:  01/04/2007  DATE OF DISCHARGE:                                 CONSULTATION   REASON FOR CONSULTATION:  Acute stroke, possible TIA candidate.   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  The patient was at work when he developed the  acute onset of left upper extremity paresthesia and heaviness.  He also  developed similar symptoms involving the left facial region and also the  left leg.  The most symptomatic area, however, was the left upper  extremity.  No dysarthria is reported.  No diplopia.  The patient was  initially reported to have only left upper extremity symptoms, but after  further evaluation, did have symptoms involving the face and left leg.  Initial CT scan was negative, but the MRI confirms an infarct.  The  patient has been on aspirin and was compliant with this.  He apparently  had chest pain about 15 years ago and had a cardiac catheterization,  which shows a 30% stenosis of the LAD.  He apparently had a recent  catheterization done in 2006, which shows normal arteries.  He has been  on lipid-lowering agents before but apparently caused muscle symptoms.  He has had some lifestyle changes with learning of his blood pressure  and reduction of the risk factors.   PAST MEDICAL HISTORY:  1. Carpal tunnel syndrome.  2. Dyslipidemia, controlled with lifestyle changes and diet.  3. Depression/obsessive-compulsive disorder.  4. Precancerous lesion of the colon.   FAMILY HISTORY:  He has had several siblings who have had coronary  disease or other atherosclerotic events in the late 10s and early 69s.  His father died from complications of a stroke at 6  and his mother from  complications of CHF in her 33s.   SOCIAL HISTORY:  He has three children.  He had a 60-pack-year history  of smoking.  Quit smoking about 10 years ago.  The patient drinks  alcoholic beverages at nighttime.   PHYSICAL EXAMINATION:  This is a pleasant gentleman in no acute  distress.  Temperature 97.1, pulse 59, respirations 18, blood pressure  122/64.  HEENT:  Neck is supple.  Head is normocephalic and atraumatic.  ABDOMEN:  Soft.  EXTREMITIES:  No significant edema.  MENTATION:  The patient is awake and alert.  He converses well.  Speech,  language, and cognition are intact.  Cranial nerves evaluation:  Pupils  are equal, round and reactive to light and accommodation.  Visual fields  are intact.  Facial strength is symmetric.  Tongue is midline.  Uvula is  midline.  Shoulder shrugs are normal.  Motor examination shows drift  involving the left upper extremity and drift involving the left leg.  He  has some impairment of fine finger movements involving the left side.  On direct strength testing, distal strength is +4 proximally.  That is  pretty good.  Right lower extremity +4/5.  The left side shows normal  tone, bulk, and strength.  Coordination:  There is no dysmetria or past  pointing.  No tremors are noted.  Sensation shows slightly impaired  sensation to temperature, light touch, and pinprick involving the lower  facial region on the left and the left upper extremity.   CT scan was reviewed in person and shows nothing acute.   MRI was also reviewed in person and shows a small cortical infarct  involving the frontal area at about the prefrontal gyri on the right,  noted on diffusion imaging.   Blood vessels on T2 imaging are normal.   ASSESSMENT:  1. Acute cortical infarct involving branch distribution of the right      middle cerebral artery.  The patient's NIH stroke scale is 3;      therefore, he is milder than the recommended criteria of 4 and       above.  2. Hyperlipidemia.   RECOMMENDATIONS:  1. Add Plavix to his current aspirin therapy.  This combination should      be continued for about 2-3 months, then he may continue with Plavix      monotherapy.  2. Physical therapy and occupational therapy.  3. Carotid and echocardiography along with MRA of the brain.  4. Lipid profile, homocysteine level, and vitamin B12 level.   Thanks for this consultation.      Kofi A. Gerilyn Pilgrim, M.D.  Electronically Signed     KAD/MEDQ  D:  01/05/2007  T:  01/05/2007  Job:  [id number]

## 2010-07-02 NOTE — Discharge Summary (Signed)
NAME:  [name], [name]           ACCOUNT NO.:  [id number]   MEDICAL RECORD NO.:  [id number]          PATIENT TYPE:  INP   LOCATION:  3027                         FACILITY:  [facility]   PHYSICIAN:  Pramod P. Pearlean Brownie, MD    DATE OF BIRTH:  19-May-1950   DATE OF ADMISSION:  02/14/2008  DATE OF DISCHARGE:  02/16/2008                               DISCHARGE SUMMARY   DIAGNOSES AT THE TIME OF DISCHARGE:  1. Small infarct, not seen on MRI, status post two-third of      intravenous tPA.  2. Hypertension.  3. Dyslipidemia.  4. Obesity.  5. History of previous stroke.  6. Neck injections for spondylosis, 2 weeks ago.  7. Gastrointestinal bleed in summer of 2009 with negative colonoscopy      and no source of bleeding.  8. History of stroke in November 2008 in the right frontoparietal      region with resultant numbness in his hand.  9. Erectile dysfunction.  10.Cardiac catheterization in 2006, normal.  11.Carpal tunnel syndrome.  12.Depression.  13.Obsessive-compulsive disorder.  14.Migraines, chronic.   MEDICINES AT THE TIME OF DISCHARGE:  1. Simvastatin 40 mg a day.  2. Aggrenox 1 p.o. nightly x2 weeks, then increase to b.i.d.  3. Fluoxetine 40 mg a day.  4. Alprazolam 0.5 mg t.i.d. p.r.n.  5. Aspirin 81 mg a day x2 weeks, then discontinue.  6. Tylenol 2 tablets 30 minutes before Aggrenox dose x1 week then      discontinue.   STUDIES PERFORMED:  1. CT at [facility] Emergency Room showed no acute abnormalities.  2. Cerebral angiogram performed at [facility] showed no      intraluminal filling defects, dissections, occlusions, or severe      stenosis.  Showed some mild atherosclerotic changes in the anterior      circulation and venous outflow was normal.  3. MRI of the brain shows small area of cortical infarct in the right      occipital lobe, minimal white matter disease and scattered      cerebellar infarcts, which are old, small pars intermedius cyst,      which is  incidental finding.  4. Followup CT 24 hours after tPA shows no acute hemorrhage.  No other      acute abnormality noted.  5. A 2-D echocardiogram shows EF of 60% with no obvious source of      embolus.  Carotid Doppler not performed.  EKG shows normal sinus      rhythm, sinus bradycardia.   LABORATORY STUDIES:  Cholesterol 157, triglycerides 124, HDL 44, LDL 88.  CBC with red blood cells 4.19, otherwise, normal.  Coagulation studies  normal.  Chemistry with glucose 118, total bilirubin 1.3, total protein  5.8, otherwise, normal.  Cardiac enzymes negative.   HISTORY OF PRESENT ILLNESS:  [name] is a 60 year old Caucasian  male who presented to [facility] Emergency Room with acute right facial  and left body weakness that began at 11:45 a.m.  The patient was seen by  Dr. Rubin Payor at [facility], who believed the patient was a code stroke,  and contacted Neurology at [facility].  Of note, the patient  had recent neck injections for spondylosis about 2 weeks ago.  He has a  history of GI bleed earlier in the summer.  Colonoscopy was negative,  although past history suggests he may have had precancerous lesions in  his colon.  There were no polyps seen.  No source of bleeding was seen.  He has not had further bleeding since that time.  Neurology recommended  two-thirds dose tPA with intent to perform cranial angiogram to rule out  vertebral dissection or occlusion.  Two-thirds tPA was given en route to  [facility] via [facility].  He had some minimal neurologic improvement en  route.  Upon arrival, he was sent to angiogram suite where he found no  significant stenosis.  He was admitted to the neuro ICU post tPA for  further evaluation.   HOSPITAL COURSE:  CT 24 hours post tPA was unremarkable.  The patient  was evaluated by PT and OT and felt to have no outpatient needs as he  only has some mild clumsiness in his hand for which occupational therapy  followup can be provided  if the patient desires though he is not  inclined to at this time.  In hospital, he was changed Plavix to  Aggrenox for secondary stroke prevention.  We will consider IRS at the  time of discharge as he is not a diabetic and research will contact him,  and he is to follow up with his primary care physician in 1 month and  follow up with the Neurology in 2-3 months in his hometown.   CONDITION AT DISCHARGE:  The patient is alert and oriented x3.  No  aphasia.  No dysarthria.  All movements are full.  Face is symmetric.  Tongue is midline.  He has no drift in his upper extremities, though he  does have decreased fine motor movement on his left hand.  He has  decreased grip in his left hand, but no lower extremity weakness at all.   DISCHARGE/PLAN:  1. Discharge home with family.  2. Aggrenox for secondary stroke prevention.  3. Follow up with primary physician in 1 month.  4. Follow up with neurologist in 2-3 months.  5. We may arrange outpatient occupational therapy if patient's family      desires after discharge.      Annie Main, N.P.    ______________________________  Sunny Schlein. Pearlean Brownie, MD    SB/MEDQ  D:  02/16/2008  T:  02/16/2008  Job:  [id number]   cc:   Ramon Dredge L. Juanetta Gosling, M.D.  Kofi A. Gerilyn Pilgrim, M.D.

## 2010-07-02 NOTE — Assessment & Plan Note (Signed)
NAMEMarland Kitchen  PAGE, LANCON            CHART#:  [id number]   DATE:  11/09/2007                       DOB:  November 02, 1950   FOLLOWUP:  Rectal bleeding.   The patient has had some painless hematochezia since early August 2009.  He stopped me in the hallway at [facility].  We discussed his symptoms.  He has a tendency towards diarrhea at baseline having 2-5 nonbloody  stools daily, noticed some clots and fresh blood intermittently with his  BM since early August 2009.  Note his H and H from back in May 2009 was  16.1 and 48.7.  From October 15, 2007, his CBC was 16.1, 48.7, MCV 96.9,  platelet count 215,000.  He underwent a colonoscopy on October 15, 2007,  which demonstrated minimal anal canal/internal hemorrhoids, very subtle  submucosa petechiae and granularity in the rectosigmoid junction,  biopsied.  He also has sigmoid diverticula.  The remainder of colonic  mucosa and terminal mucosa appeared entirely normal.  We treated him  with a course of canal suppositories and overall he states his rectal  bleeding has persisted at intermittent low-volume rate.  We have  extended out his canal suppositories to the current time without overall  much improvement in his symptoms.  Again we repeated labs on November 05, 2007, his H and H 15.6, 44.0, sed rate came back 2.  He does feel  that he has some vague bilateral lower abdominal cramps.  He is not  using any nonsteroidals.  He does take Plavix 75 mg daily for PAD.  Path  on the rectosigmoid biopsies demonstrated ischemic colitis, although  endoscopically this did not appear to be anything at all resembling  ischemic colitis.  The patient has a personal history of colonic  adenomas.  Last colonoscopy prior to the most recent was June 2008 by  Dr. Loreta Ave.  He was found to have single hyperplastic polyp and one  adenoma in his left colon.   It is also notable that he is not using any nonsteroidals, but does  drink three to five glasses of wine when  he gets off at work daily and  basically he gets a bottle of wine daily.  He rarely consumes beer.   CURRENT MEDICATIONS:  See updated list, not mentioned above.  He has  been on probiotic in way of Align and more recently assessed __________  without any improvement in symptoms.   ALLERGIES:  Bee stings.   PHYSICAL EXAMINATION:  GENERAL:  Accompanied by his wife.  He appears in  no acute distress.  VITAL SIGNS:  Weight 212, height 6 feet, temperature 97.8, BP 122/80,  and pulse 60.  SKIN:  Warm and dry.  CHEST:  Lungs are clear to auscultation.  CARDIAC:  Regular rate and rhythm without murmur, gallop, or rub.  ABDOMEN:  Nondistended.  Positive bowel sounds.  Very minimal left lower  quadrant pain to deep palpation.  No appreciable mass or  hepatosplenomegaly.  RECTAL:  No external lesions.  Digital exam nontender.  No mass  appreciated in the vault.  There is no stool in rectal vault.   Mucus is recovered which is Hemoccult negative.   IMPRESSION:  Stuttering intermittent low volume hematochezia.   He had very subtle inflammatory changes and his rectosigmoid biopsies  consistent with ischemic colitis, minimal anal canal/internal  hemorrhoids.  He has had a repeatedly normal H and H.  He is Hemoccult  negative today.  He is quite concerned by his persisting symptoms and  vague lower abdominal pain he has been experiencing.   I did reassure he and his wife (who accompanies him today) that findings  of the colonoscopy were subtle, but I would like to get more information  as Dr. Nedra Hai __________  report of ischemia as again endoscopic findings  were atypical.  He does have history of peripheral artery disease.  I  wonder if he is not bleeding from hemorrhoids.   The other possibility would be were just simply on the front and new  onset inflammatory bowel disease and his disease process is simply  worsening.   Topical mesalamine in the way of suppositories really made a  difference  in the symptoms nor has probiotic therapy.   RECOMMENDATIONS:  1. Stop her probiotics.  2. Stop Canasa.  3. A 7-day course of hydrocortisone 100 mg enemas one per rectum at      bedtime.  4. Curtail wine consumption to one glass nightly or better none for      the foreseeable future.  5. We will touch base with him in 1 week and see how he is doing      unless he has dramatically improved.  We will consider a      sigmoidoscopy to further evaluate his ongoing persisting symptoms.  6. I have also put a call in to discuss the previous path with Dr.      Nedra Hai.  She is to come back tomorrow.       Jonathon Bellows, M.D.  Electronically Signed     RMR/MEDQ  D:  11/09/2007  T:  11/10/2007  Job:  [id number]   cc:   Ramon Dredge L. Juanetta Gosling, M.D.

## 2010-07-02 NOTE — H&P (Signed)
NAME:  [name], [name] NO.:  [id number]   MEDICAL RECORD NO.:  [id number]          PATIENT TYPE:  INP   LOCATION:  3111                         FACILITY:  [facility]   PHYSICIAN:  Deanna Artis. Hickling, M.D.DATE OF BIRTH:  Apr 23, 1950   DATE OF ADMISSION:  06/19/2008  DATE OF DISCHARGE:                              HISTORY & PHYSICAL   CHIEF COMPLAINT:  Blurred vision, left eye, left visual field cut, left-  sided weakness.   HISTORY OF PRESENT CONDITION:  The patient had onset of lightheadedness  around 1515 hours.  He had blurred vision in both eyes around 1530, a  dull occipital headache at 1645.  His blurred vision cleared in the  right eye leaving the left eye blurred.  He had a heavy feeling in his  left arm and leg.  NIH stroke scale was initially estimated at 1.  After  calling me and discussing the case, I asked Dr. Colon Branch to check his  visual field and it turned out that he had a left homonymous field cut.  NIH stroke scale was 3.  It was at that time that I recommended  treatment with total dose IV t-PA.   The patient had stroke in November 2008 that was right frontoparietal,  second stroke in December 2009 that was right parietal.  A cluster of  lesions on the right side.  An MRI scan evaluated in March 2009.  He was  placed in the PFO closure study and received randomization of the  medical arm.  His wife is concerned about the frequency of these  episodes, believes that PFO is the reason for it, and I think may want  to pull him from the study and send him to [facility].  I told her that she  would need to discuss this with Dr. Pearlean Brownie who is the principal  investigator for that study.   The patient's current risk factors are hypertension, dyslipidemia, mild  obesity, and prior stroke.   PAST SURGICAL HISTORY:  None.   MEDICATIONS:  1. Aggrenox 25/200 one twice daily.  2. Multivitamin daily.  3. Alprazolam 0.5 mg as needed.  4. Prozac 60 mg daily.  5.  Simvastatin 40 mg in the evening.   ALLERGIES:  BEESTING, otherwise no allergies to drugs.   FAMILY HISTORY:  Mother died in her 47s of congestive heart failure,  father died in his 62s of stroke, brother died in his 64s of myocardial  infarction, and another brother died in his 29s of lung cancer.  He has  2 sisters with cardiac disease.   SOCIAL HISTORY:  The patient has been married.  He has an adopted son.  He is a Building services engineer at [employer].  He has a 60 pack-year  history of smoking, but quit 10-15 years ago.  He drinks 2 mixed drinks  at nighttime.  He does not use drugs.   REVIEW OF SYSTEMS:  Untreated carpal tunnel syndrome, right hand.  CARDIOVASCULAR:  Negative cardiac cath in 2006.  PULMONARY:  Negative GI  bleed in 2009.  GU:  No complaints.  MUSCULOSKELETAL:  Cervical  spondylosis.  ENDOCRINE:  No complaints.  REPRODUCTIVE:  Erectile  dysfunction.  HEMATOLOGIC/LYMPHATIC:  Negative.  ALLERGY/IMMUNOLOGY:  Negative.  NEUROLOGIC:  Migraines.  PSYCHIATRIC:  Periodic mood  disorder, depression, obsessive-compulsive disorder.   PHYSICAL EXAMINATION:  VITAL SIGNS:  Temperature 98.5, blood pressure  142/66, resting pulse 90, respirations 13, oxygen saturation 99%.  EAR, NOSE, AND THROAT:  No infections.  No bruits.  NECK:  Supple.  LUNGS:  Clear.  HEART:  No murmurs.  Pulses normal.  ABDOMEN:  Soft.  Bowel sounds normal.  No hepatosplenomegaly.  SKIN:  Negative.  NEUROLOGIC:  Awake, alert without dysphasia.  Cranial nerves round and  reactive pupils.  No afferent pupillary defect.  Visual fields full to  double simultaneous stimuli.  Symmetric facial strength.  Midline  tongue.  Air conduction greater than bone conduction bilaterally.  Motor  examination, the patient had normal strength in arms and legs with the  exception of mild left leg proximal weakness 4/5.  Fine motor movements  were normal.  There was no pronator drift.  The patient has left  hemihypesthesia  involving the face, arm, and leg with leg probably more  involved than the other 2 regions.  He had good stereoagnosis  bilaterally.   NIH stroke scale at [facility] was 2 at 2110 hours.  Modified  Rankin is 2.  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  I reviewed the CT scan of the head, it was negative.  PT  13.4, INR 1.0, PTT 26.  Sodium 138, potassium 4.2, chloride 102, CO2 of  27, BUN 21, creatinine 0.79, glucose 89.  Liver functions were normal.  Creatinine kinase was normal.   CBC, white count 6500, hemoglobin 14.2, hematocrit 43.5, platelet count  312,000, normal diff.   IMPRESSION:  The patient appears to have had a right anterior  circulation, posterior division middle cerebral artery infarction with  numbness and visual field cut.  It could be a posterior cerebral artery  infarct as well.  This appears to have been a completed event and he has  recovered nicely from it after t-PA.  This has now marked his third  stroke and perhaps his fourth based on MRI criteria.  He has a patent  foramen ovale, which his wife believes is the source of his stroke.  He  has risk factors that have been treated.  His dysphagia is a result of  this current stroke.(434.11)   The patient will need a modified barium swallow study tomorrow.  He will  be n.p.o. tonight for all medications.  I am not certain that he needs  thorough workup that he had back in December 2009 and I will leave that  to Dr. Pearlean Brownie to pursue.   I appreciate the opportunity to participate in his care.  He will be  placed on our Stroke Service and will be seen more by Dr. Pearlean Brownie.      Deanna Artis. Sharene Skeans, M.D.  Electronically Signed     WHH/MEDQ  D:  06/19/2008  T:  06/20/2008  Job:  [id number]   cc:   Ramon Dredge L. Juanetta Gosling, M.D.  Pramod P. Pearlean Brownie, MD

## 2010-07-02 NOTE — Op Note (Signed)
NAME:  [name], [name]           ACCOUNT NO.:  [id number]   MEDICAL RECORD NO.:  [id number]          PATIENT TYPE:  AMB   LOCATION:  DAY                           FACILITY:  [facility]   PHYSICIAN:  R. Roetta Sessions, M.D. DATE OF BIRTH:  06-Mar-1950   DATE OF PROCEDURE:  10/15/2007  DATE OF DISCHARGE:                               OPERATIVE REPORT   PROCEDURE:  Ileocolonoscopy with biopsy.   INDICATION FOR PROCEDURE:  A 60 year old gentleman with a several-week  history of intermittent painless low-volume hematochezia.  He has a  tendency towards diarrhea, has typically a couple of bowel movements  daily; however, has been passing a small amount of blood clot per  rectum.  He has history of colonic adenomas.  He last had a colonoscopy  in June 2008 by Dr. Karilyn Cota, found a single hyperplastic and 1 adenoma in  his left colon.  He has not had any abdominal pain.  No upper GI tract  symptoms such as odynophagia, dysphagia, acid reflux symptoms, nausea or  vomiting.  A CBC of Jul 12, 2007, demonstrated a white count of 7.98,  H&H 16.1 and 48.7, MCV 96.9, and platelet count 215.  Colonoscopy is now  being done.  Risks, benefits, alternatives, and limitations have been  reviewed, questions answered.  Please see documentation in the medical  record.   PROCEDURE NOTE:  O2 saturation, blood pressure, pulse, and respirations  monitored throughout the entire procedure.   CONSCIOUS SEDATION:  Versed 6 mg IV and Demerol 125 mg IV in divided  doses.  Phenergan 12.5 mg diluted slow IV push to augment conscious  sedation.   INSTRUMENTATION:  Pentax video chip system.   FINDINGS:  Digital rectal exam revealed no abnormalities.  Endoscopic  findings:  The prep was good colon.  Colon:  Colonic mucosa was surveyed  from the rectosigmoid junction through the left transverse, right colon,  appendiceal orifice, ileocecal valve, and cecum.  These structures were  well seen and photographed for the record.   Terminal ileum was intubated  to good 15 cm from this level and scope was slowly cautiously withdrawn.  All previously mentioned mucosal surfaces were again seen.  The patient  had scattered sigmoid diverticula.  There are a couple of fine  submucosal petechiae in the rectosigmoid junction, and normal  granularity in the segment.  There was no erosion, ulcer crater, polyp,  or evidence of neoplasm.  The terminal ileal mucosa appeared normal.  The scope was pulled down the rectum with examination of the rectal  mucosa including retroflex view of the anal verge.  En face view of the  anal canal demonstrated minimal internal canal/anal canal hemorrhoids.  The patient tolerated the procedure very well and was reacted to  endoscopy.   IMPRESSION:  1. Minimal anal canal/internal hemorrhoids, otherwise normal rectum.      Subtle submucosal petechiae and granularity in the rectosigmoid      junction and nonspecific finding, status post biopsy (not mentioned      above).  2. Scattered sigmoid diverticulum.  Colonic mucosa, and terminal ileal      mucosa appeared  normal.   No evidence of polyp or neoplasm.   RECOMMENDATIONS:  1. Diverticulosis hemorrhoid literature provided to Mr. Uhde.  2. A 14-day course of mesalamine suppositories, i.e. Canasa 1 g      suppositories one per rectum at bedtime.  Mr. Fidalgo should go      to the office for free samples.  Also, I would like to put him on a      probiotic in a way of 1 capsule daily  3. Followup on path.  4. Further recommendations to follow.      Jonathon Bellows, M.D.  Electronically Signed     RMR/MEDQ  D:  10/15/2007  T:  10/15/2007  Job:  [id number]   cc:   Ramon Dredge L. Juanetta Gosling, M.D.  Fax: [phone]

## 2010-07-02 NOTE — H&P (Signed)
NAME:  [name], [name] NO.:  [id number]   MEDICAL RECORD NO.:  [id number]          PATIENT TYPE:  INP   LOCATION:  3104                         FACILITY:  [facility]   PHYSICIAN:  Deanna Artis. Hickling, M.D.DATE OF BIRTH:  [birth year]   DATE OF ADMISSION:  02/14/2008  DATE OF DISCHARGE:                              HISTORY & PHYSICAL   CHIEF COMPLAINT:  Headache followed by right facial and left body  weakness beginning at 11:45 a.m..  The patient presented to the [facility] Emergency Room with these symptoms and was seen by Dr. Rubin Payor  who contacted me.  He believed the patient had a code stroke and the  symptoms suggested that this is a brain stem infarction.   The patient has had recent neck injections for spondylosis last 2 weeks  ago.  He had a GI bleed earlier this summer.  Colonoscopy was negative  although past history suggests that he may have had precancerous  lesions in his colon.  Note, no polyps were seen.  No source of  bleeding was seen and the patient had diverticulosis.  He has not had  bleeding since that time.   I recommended the administration of two-thirds t-PA with the intent of  performing a cranial angiogram to rule out a vertebrobasilar dissection  or occlusion.   The patient had cleared to some degree en route after been given a bolus  and was still receiving two-thirds of t-PA drip (0.6 mg/kg).   Past medical history is remarkable for a stroke that took place in  November 2008.  This involved a small lesion in the right frontoparietal  area and left him with some numbness in his hands.  He had some numbness  and weakness on presentation at that time.   The patient has erectile dysfunction.  He had a cardiac catheterization  in 2006 that was normal.  He has carpal tunnel syndrome that has not  been treated.  He has a history of depression, obsessive compulsive  disorder and migraines.   He had an MRI scan for other suspected stroke was  associated with  headache in March 2009 that showed multiple subcentimeter lesions within  the cerebellar hemispheres and small lesion in the right frontoparietal  area, small vessel white matter disease.   His risk factors for stroke include hypertension, dyslipidemia, obesity  and prior stroke.   CURRENT MEDICATIONS:  1. Simvastatin 40 mg daily.  2. Plavix 75 mg daily.  3. Fluoxetine 40 mg daily.  4. Alprazolam 0.5 mg three times daily as needed.   ALLERGIES TO MEDICINES:  None.  He has allergies to BEE STING.   FAMILY HISTORY:  Mother died in her 2s of congestive heart failure.  Father died in his 72s of cerebrovascular disease.  Brother died in his  69s of myocardial infarction.  Another brother died in 61s of lung  cancer.  He has two sisters with cardiac disease.   SOCIAL HISTORY:  The patient is married.  He is a Building control surveyor at  [employer].  He has a 60 pack-year history of smoking and quit 10-15  years ago.  He drinks two mixed drinks at night.  He does not use drugs.   REVIEW OF SYSTEMS:  His 12 system review is remarkable for a negative  cardiac cath.  He has not had significant lung disease.  He had cervical  spondylosis with some numbness in his right hand and some mild weakness  on the right side that is not apparent at this time.  He has erectile  dysfunction.  12 system review is otherwise negative except as noted  above.   PHYSICAL EXAMINATION:  VITAL SIGNS:  Temperature 97.6, blood pressure  149/90, resting pulse 66, respirations 18, oxygen saturation 97%.  EARS, NOSE AND THROAT:  No infections.  No bruits.  NECK:  Supple.  LUNGS:  Clear.  HEART: No murmurs.  Pulse is normal.  ABDOMEN:  Soft.  Bowel sounds normal.  No hepatosplenomegaly.  SKIN:  No lesions.  NEUROLOGIC:  The patient was awake, alert, has dysarthria without  dysphasia.  Visual fields are full to double simultaneous stimuli.  The  patient shows symmetric facial strength.  I do not see  right facial  weakness at this time.  He is able to protrude his tongue and elevate  his uvula midline.  Motor examination; the patient has definite drift on  the left side which is not present on the right.  He has clumsy fine  motor movements on the left side.  He has 4+/5 strength proximally and  5/5 strength distally despite his clumsiness.  I did not get him up to  have him walk.  There is a decreased sensation over the left face and  arm and he seems to extinguish to double simultaneous stimuli there.  He  has good stereoagnosis bilaterally.  Cerebellar examination; good finger-  to-nose and heel-knee-shin.  Deep tendon reflexes were symmetrically  diminished.  The patient had bilateral flexor plantar responses.   LABORATORY STUDIES:  CT scan of the brain shows no acute infarction.  Prothrombin time 13.0, INR 1.0, PTT 25.  White blood cell count 7600,  hemoglobin 14.9, hematocrit 43.8, platelet count 191,000.  Differential  was normal.  Sodium 139, potassium 4.2, chloride 110, CO2 24, BUN 21,  creatinine 0.76, glucose 113.  His GFR is greater than 60.  His alkaline  phosphatase is 47, ALT 42, total protein low at 5.8, albumin 3.5,  calcium 8.6.  His cardiac screen was negative.   IMPRESSION:  His history suggests a right brain stem stroke.  His  examination at this point could be a small right lacune anywhere within  the subcortical white matter.  This is not a cortical stroke.  The  patient has had prior right frontoparietal lacunar infarction in  November 2008 and hypertension, dyslipidemia and obesity as his risk  factors.   He was given two third t-PA with the expectation that he would get an  angiogram.  He is there at the time of this dictation.  I suspect that  we will not find significant occlusion.  He is going to have an MRI  scan, 2-D echocardiogram and other noninvasive workup.  He will be  observed overnight in the intensive care unit following t-PA to monitor  his  blood pressure and monitor for change in his neuro status and/or  bleeding.  I spent an hour of face-to-face time with him providing  critical care, making a decision to give t-PA monitoring when he arrived  at  [facility] and steering him through the evaluation to  catheterization.  Also spoke at length with the patient and his wife talking about  prognosis.  I did not have time to speak with them about risk factors  for stroke other than dimension those that he had.      Deanna Artis. Sharene Skeans, M.D.  Electronically Signed     WHH/MEDQ  D:  02/14/2008  T:  02/15/2008  Job:  [id number]   cc:   Ramon Dredge L. Juanetta Gosling, M.D.  Kofi A. Gerilyn Pilgrim, M.D.

## 2010-07-02 NOTE — Procedures (Signed)
NAME:  JACQUEZ, SHEETZ NO.:  [id number]   MEDICAL RECORD NO.:  [id number]          PATIENT TYPE:  INP   LOCATION:  A206                          FACILITY:  [facility]   PHYSICIAN:  [name]. Dietrich Pates, MD, FACCDATE OF BIRTH:  04-Sep-1950   DATE OF PROCEDURE:  01/05/2007  DATE OF DISCHARGE:  01/06/2007                                ECHOCARDIOGRAM   CLINICAL DATA:  A 60 year old gentleman with CVA.   M-MODE:  Aorta 3.9, left atrium 3.1, septum 1.2, posterior wall 1.1, LV  diastole 5.1, LV systole 3.7.   1. Technically adequate echocardiographic study.  2. Normal left atrium, right atrium and right ventricle.  3. Trileaflet aortic valve with mild sclerosis.  Normal proximal      ascending aorta except for mild calcification of the wall and      annulus.  4. Normal tricuspid and mitral valve; physiologic tricuspid      regurgitation.  5. Normal proximal pulmonary artery; pulmonic valve suboptimally      imaged, but appears normal.  6. Normal left ventricular size; very mild concentric hypertrophy;      normal regional and global function.  7. IVC poorly imaged - no abnormalities detected.      [name]. Dietrich Pates, MD, [facility]  Electronically Signed     RMR/MEDQ  D:  01/07/2007  T:  01/07/2007  Job:  [id number]

## 2010-07-02 NOTE — Group Therapy Note (Signed)
NAME:  [name], [name]           ACCOUNT NO.:  [id number]   MEDICAL RECORD NO.:  [id number]          PATIENT TYPE:  INP   LOCATION:  A206                          [facility]AC[facility]   PHYSICIAN:  Edward L. Juanetta Gosling, M.D.DATE OF BIRTH:  [birth year]   DATE OF PROCEDURE:  DATE OF DISCHARGE:  01/06/2007                                 PROGRESS NOTE   PROBLEM:  Stroke.   SUBJECTIVE:  [name] is much improved.  He has almost normal grip  strength in his left and right hand now.  He has less weakness of his  left arm.  He has less weakness of his left leg.   Otherwise his temperature is 97.2, pulse 75, respirations 20, blood  pressure 126/77, O2 sats 96% on room air.  And he says he feels well.   His lipid profile showed that his LDL cholesterol was greater than 100,  so I think he is going to need to be on something for his cholesterol,  also for stroke prevention.  His carotid study did not show any  significant abnormality.   ASSESSMENT:  He is better.   PLAN:  Discharge home.  Please see discharge summary for details.      Edward L. Juanetta Gosling, M.D.  Electronically Signed     ELH/MEDQ  D:  01/06/2007  T:  01/06/2007  Job:  [id number]

## 2010-07-05 NOTE — Procedures (Signed)
NAME:  TEOFIL, MANIACI NO.:  [id number]   MEDICAL RECORD NO.:  [id number]                   PATIENT TYPE:  OUT   LOCATION:  RAD                                  FACILITY:  [facility]   PHYSICIAN:  Pricilla Riffle, M.D.                 DATE OF BIRTH:  12-Jun-1950   DATE OF PROCEDURE:  DATE OF DISCHARGE:                                    STRESS TEST   PROCEDURE:  Exercise Cardiolite.   INDICATIONS:  Mr. Chandley is a 60 year old male with a past medical  history significant for nonobstructive coronary disease by catheterization  in 2000, now with progressive dyspnea on exertion and occasional chest  discomfort.   BASELINE DATA:  EKG reveals sinus bradycardia at 54 beats per minute  nonspecific ST abnormalities.  Blood pressure is 138/82.   The patient exercised for a total of 9 minutes and 12 seconds to Bruce  protocol stage IV and 10.1 METS.  The patient complained of chest pressure  which radiated to his jaw and shortness of breath with exercise.  These  symptoms resolved in recovery.  Maximum heart rate was 144 beats per minute  which is 87% of predicted maximum.  Maximum blood pressure is 190/92.  EKG  revealed no arrhythmias and no ischemic changes.  The test was stopped  secondary to fatigue.   Final images and results are pending M.D. review.     ________________________________________  ___________________________________________  Jae Dire, P.A. [facility]                      Pricilla Riffle, M.D.   AB/MEDQ  D:  04/14/2003  T:  04/15/2003  Job:  [id number]

## 2010-07-05 NOTE — Procedures (Signed)
NAME:  [name], [name]                     ACCOUNT NO.:  [id number]   MEDICAL RECORD NO.:  [id number]                   PATIENT TYPE:  OUT   LOCATION:  RAD                                  FACILITY:  [facility]   PHYSICIAN:  Edward L. Juanetta Gosling, M.D.             DATE OF BIRTH:  18-Sep-1950   DATE OF PROCEDURE:  DATE OF DISCHARGE:                              PULMONARY FUNCTION TEST   FINDINGS:  1. Spirometry shows airflow obstruction of the area of the small airways,     but is otherwise normal.  2. Lung volumes are normal.  3. DLCO is normal.  4. Blood gas is normal.      ___________________________________________                                            Oneal Deputy. Juanetta Gosling, M.D.   ELH/MEDQ  D:  04/04/2003  T:  04/04/2003  Job:  [id number]
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
## 2010-07-05 NOTE — Cardiovascular Report (Signed)
NAME:  [name], [name] NO.:  [id number]   MEDICAL RECORD NO.:  [id number]          PATIENT TYPE:  INP   LOCATION:  2930                         FACILITY:  [facility]   PHYSICIAN:  Charlton Haws, M.D.     DATE OF BIRTH:  1950/10/31   DATE OF PROCEDURE:  10/01/2004  DATE OF DISCHARGE:                              CARDIAC CATHETERIZATION   INDICATIONS:  Significant chest pain, coronary risk factors.   Catheterization is done from the right femoral artery using 6-French  catheters.   Left main coronary was normal.   Left anterior descending artery was normal.   First and second diagonal branches normal.   Circumflex coronary artery was nondominant and normal.   Right coronary artery was dominant and normal.   Because of the patient's significant chest pain and no coronary disease, LAO  ascending aortography was performed. There was no evidence of dissection or  aneurysm.   Distal abdominal aortography revealed widely patent renal arteries with no  abdominal aortic aneurysm.   Aortic pressure was 100/65, LV pressure was 100/8.   IMPRESSION:  The patient's pain would appear to be noncardiac in etiology.   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 I think it is reasonable to keep him overnight and do a CT  scan of his chest tomorrow to check on other noncardiac etiologies to his  pain.   He tolerated the procedure well; 4 milligrams of Versed was given during the  case for relaxation.           ______________________________  Charlton Haws, M.D.     PN/MEDQ  D:  10/01/2004  T:  10/01/2004  Job:  [id number]   cc:   Vida Roller, M.D.  Fax: [phone]   Oneal Deputy. Juanetta Gosling, M.D.  [address]  [place]  [place] [address]  Fax: [phone]

## 2010-07-05 NOTE — Discharge Summary (Signed)
NAME:  [name], [name] NO.:  [id number]   MEDICAL RECORD NO.:  [id number]          PATIENT TYPE:  INP   LOCATION:  3111                         FACILITY:  [facility]   PHYSICIAN:  Levert Feinstein, MD          DATE OF BIRTH:  1950-04-25   DATE OF ADMISSION:  06/19/2008  DATE OF DISCHARGE:  06/21/2008                               DISCHARGE SUMMARY   DISCHARGE DIAGNOSES:  1. TIA, no acute lesions by MRI, status post t-PA.  2. Previous stroke in November 2008, at right parietal lobe.  3. History of patent foramen ovale.  4. Hypertension.  5. Hyperlipidemia.  6. Obesity.   DISCHARGE MEDICATIONS:  1. Aggrenox 25/200 twice a day.  2. Multivitamin.  3. Xanax 0.5 as needed.  4. Prozac 60 mg every day.  5. Zocor 40 mg in the morning.   CLINICAL HISTORY:  The patient is a 60 year old Caucasian male, had  acute onset of lightheadedness at Jun 19, 2008, 1515, he began to  experience blurry vision in both eye at1530, dull occipital area  headache at 1645, his blurry vision cleared in the right eye, leaving  the left eye continue blurred.  Heavy feeling in her left arm and leg.  He was reported to have left homonymous field cut upon initial  presentation at [facility], with NIH stroke scale of 3.   He has received IV t-PA treatment subsequently, this is the second time  receiving IV t-PA, the first was on December 2009, presenting with right  face, and body weakness.   The patient's hospital course went uneventfully, and he stayed in the  ICU for 24 hours, MRI of the brain: there was no evidence of acute  infarction, MRA of the head was negative.   During the hospital stay, he also received a transesophageal  echocardiogram, which has demonstrated PFO, tunnel measured 1.5 cm, with  a diameter of 1.5-2 mm.  There was 15-20 bubbles crossing the PFO from  right to left at rest, greater than 20 bubbles with Valsalva strain.  Septum was borderline, aneurysmal, with movement  into the left atrium by  10 mm.  The patient continued to complain some low back pain, left leg  difficulty, was evaluated by PT/OT, recommended outpatient PT/OT.   Upon discharge, he is awake, alert, oriented.  I do not see any visual  field deficit, there was no lateralize motor or sensory deficit, he  complained of low back pain, mild left leg clumsiness, does have mild  gait antalgic gait, but fairly steady, was able to perform tip-toe, and  heel walking.   He was discharged home with outpatient PT/OT, follow up with Dr. Pearlean Brownie  in 1-2 months.  He is going to follow up with Dr. Jacinto Halim for further  discussion about potential PFO closure.      Levert Feinstein, MD  Electronically Signed     YY/MEDQ  D:  06/22/2008  T:  06/23/2008  Job:  [id number]

## 2010-07-08 ENCOUNTER — Ambulatory Visit (HOSPITAL_COMMUNITY)
Admission: RE | Admit: 2010-07-08 | Discharge: 2010-07-08 | Disposition: A | Discharge status: Home / Self Care | Payer: 59 | Source: Ambulatory Visit | Attending: Pulmonary Disease | Admitting: Pulmonary Disease

## 2010-07-08 ENCOUNTER — Other Ambulatory Visit (HOSPITAL_COMMUNITY): Payer: Self-pay | Admitting: Pulmonary Disease

## 2010-07-08 DIAGNOSIS — R52 Pain, unspecified: Secondary | ICD-10-CM

## 2010-07-08 DIAGNOSIS — M533 Sacrococcygeal disorders, not elsewhere classified: Secondary | ICD-10-CM | POA: Insufficient documentation

## 2010-10-09 ENCOUNTER — Other Ambulatory Visit (HOSPITAL_COMMUNITY): Payer: Self-pay | Admitting: Pulmonary Disease

## 2010-10-09 DIAGNOSIS — R911 Solitary pulmonary nodule: Secondary | ICD-10-CM

## 2010-10-10 ENCOUNTER — Other Ambulatory Visit (HOSPITAL_COMMUNITY): Payer: No Typology Code available for payment source

## 2010-10-10 ENCOUNTER — Ambulatory Visit (HOSPITAL_COMMUNITY)
Admission: RE | Admit: 2010-10-10 | Discharge: 2010-10-10 | Disposition: A | Discharge status: Home / Self Care | Payer: Medicare Other | Source: Ambulatory Visit | Attending: Pulmonary Disease | Admitting: Pulmonary Disease

## 2010-10-10 DIAGNOSIS — Z09 Encounter for follow-up examination after completed treatment for conditions other than malignant neoplasm: Secondary | ICD-10-CM | POA: Insufficient documentation

## 2010-10-10 DIAGNOSIS — J984 Other disorders of lung: Secondary | ICD-10-CM | POA: Insufficient documentation

## 2010-10-10 DIAGNOSIS — R911 Solitary pulmonary nodule: Secondary | ICD-10-CM

## 2010-11-11 LAB — BASIC METABOLIC PANEL
BUN: 17
CO2: 26
Calcium: 9.1
Chloride: 107
Creatinine, Ser: 0.85
GFR calc Af Amer: >60
GFR calc non Af Amer: >60
Glucose, Bld: 125 — ABNORMAL HIGH
Potassium: 4.1
Sodium: 138

## 2010-11-11 LAB — POCT CARDIAC MARKERS
CKMB, poc: <1 — ABNORMAL LOW
Myoglobin, poc: 71
Operator id: 270681
Troponin i, poc: <0.05

## 2010-11-11 LAB — DIFFERENTIAL
Basophils Absolute: 0.1
Basophils Relative: 1
Eosinophils Absolute: 0.2
Eosinophils Relative: 3
Lymphocytes Relative: 22
Lymphs Abs: 1.3
Monocytes Absolute: 0.6
Monocytes Relative: 10
Neutro Abs: 3.7
Neutrophils Relative %: 64

## 2010-11-11 LAB — PROTIME-INR
INR: 1
Prothrombin Time: 13.4

## 2010-11-11 LAB — T4, FREE: Free T4: 1.26

## 2010-11-11 LAB — CBC
HCT: 43.9
Hemoglobin: 15.3
MCHC: 34.8
MCV: 94.7
Platelets: 204
RBC: 4.64
RDW: 12.2
WBC: 5.7

## 2010-11-11 LAB — TSH: TSH: 0.682

## 2010-11-11 LAB — APTT: aPTT: 25

## 2010-11-21 LAB — PROTIME-INR
INR: 1 (ref 0.00–1.49)
Prothrombin Time: 13 seconds (ref 11.6–15.2)

## 2010-11-21 LAB — CBC
HCT: 43.8 % (ref 39.0–52.0)
Hemoglobin: 14.9 g/dL (ref 13.0–17.0)
MCHC: 34 g/dL (ref 30.0–36.0)
MCV: 96.8 fL (ref 78.0–100.0)
Platelets: 191 10*3/uL (ref 150–400)
RBC: 4.52 MIL/uL (ref 4.22–5.81)
RDW: 13.1 % (ref 11.5–15.5)
WBC: 7.6 10*3/uL (ref 4.0–10.5)

## 2010-11-21 LAB — COMPREHENSIVE METABOLIC PANEL
ALT: 42 U/L (ref 0–53)
AST: 24 U/L (ref 0–37)
Albumin: 3.5 g/dL (ref 3.5–5.2)
Alkaline Phosphatase: 47 U/L (ref 39–117)
BUN: 21 mg/dL (ref 6–23)
CO2: 24 mEq/L (ref 19–32)
Calcium: 8.6 mg/dL (ref 8.4–10.5)
Chloride: 110 mEq/L (ref 96–112)
Creatinine, Ser: 0.76 mg/dL (ref 0.4–1.5)
GFR calc Af Amer: >60 mL/min (ref >60)
GFR calc non Af Amer: >60 mL/min (ref >60)
Glucose, Bld: 113 mg/dL — ABNORMAL HIGH (ref 70–99)
Potassium: 4.2 mEq/L (ref 3.5–5.1)
Sodium: 139 mEq/L (ref 135–145)
Total Bilirubin: 0.9 mg/dL (ref 0.3–1.2)
Total Protein: 5.8 g/dL — ABNORMAL LOW (ref 6.0–8.3)

## 2010-11-21 LAB — DIFFERENTIAL
Basophils Absolute: 0 10*3/uL (ref 0.0–0.1)
Basophils Relative: 0 % (ref 0–1)
Eosinophils Absolute: 0.1 10*3/uL (ref 0.0–0.7)
Eosinophils Relative: 1 % (ref 0–5)
Lymphocytes Relative: 13 % (ref 12–46)
Lymphs Abs: 1 10*3/uL (ref 0.7–4.0)
Monocytes Absolute: 0.7 10*3/uL (ref 0.1–1.0)
Monocytes Relative: 10 % (ref 3–12)
Neutro Abs: 5.8 10*3/uL (ref 1.7–7.7)
Neutrophils Relative %: 77 % (ref 43–77)

## 2010-11-21 LAB — POCT CARDIAC MARKERS
CKMB, poc: <1 ng/mL — ABNORMAL LOW (ref 1.0–8.0)
Myoglobin, poc: 49.2 ng/mL (ref 12–200)
Troponin i, poc: <0.05 ng/mL (ref 0.00–0.09)

## 2010-11-21 LAB — CK TOTAL AND CKMB (NOT AT ARMC)
CK, MB: 1 ng/mL (ref 0.3–4.0)
Relative Index: INVALID (ref 0.0–2.5)
Total CK: 42 U/L (ref 7–232)

## 2010-11-21 LAB — APTT: aPTT: 25 seconds (ref 24–37)

## 2010-11-21 LAB — TROPONIN I: Troponin I: 0.02 ng/mL (ref 0.00–0.06)

## 2010-11-22 LAB — CBC
HCT: 40.8 % (ref 39.0–52.0)
HCT: 45.9 % (ref 39.0–52.0)
Hemoglobin: 13.8 g/dL (ref 13.0–17.0)
Hemoglobin: 15.3 g/dL (ref 13.0–17.0)
MCHC: 33.3 g/dL (ref 30.0–36.0)
MCHC: 33.9 g/dL (ref 30.0–36.0)
MCV: 97.5 fL (ref 78.0–100.0)
MCV: 98.3 fL (ref 78.0–100.0)
Platelets: 183 10*3/uL (ref 150–400)
Platelets: 200 10*3/uL (ref 150–400)
RBC: 4.19 MIL/uL — ABNORMAL LOW (ref 4.22–5.81)
RBC: 4.67 MIL/uL (ref 4.22–5.81)
RDW: 13.1 % (ref 11.5–15.5)
RDW: 13.4 % (ref 11.5–15.5)
WBC: 10 10*3/uL (ref 4.0–10.5)
WBC: 6.4 10*3/uL (ref 4.0–10.5)

## 2010-11-22 LAB — CK TOTAL AND CKMB (NOT AT ARMC)
CK, MB: 1 ng/mL (ref 0.3–4.0)
Relative Index: INVALID (ref 0.0–2.5)
Total CK: 49 U/L (ref 7–232)

## 2010-11-22 LAB — COMPREHENSIVE METABOLIC PANEL
ALT: 45 U/L (ref 0–53)
AST: 22 U/L (ref 0–37)
Albumin: 3.6 g/dL (ref 3.5–5.2)
Alkaline Phosphatase: 48 U/L (ref 39–117)
BUN: 20 mg/dL (ref 6–23)
CO2: 23 mEq/L (ref 19–32)
Calcium: 8.5 mg/dL (ref 8.4–10.5)
Chloride: 107 mEq/L (ref 96–112)
Creatinine, Ser: 0.75 mg/dL (ref 0.4–1.5)
GFR calc Af Amer: >60 mL/min (ref >60)
GFR calc non Af Amer: >60 mL/min (ref >60)
Glucose, Bld: 118 mg/dL — ABNORMAL HIGH (ref 70–99)
Potassium: 4.4 mEq/L (ref 3.5–5.1)
Sodium: 137 mEq/L (ref 135–145)
Total Bilirubin: 1.3 mg/dL — ABNORMAL HIGH (ref 0.3–1.2)
Total Protein: 5.8 g/dL — ABNORMAL LOW (ref 6.0–8.3)

## 2010-11-22 LAB — LIPID PANEL
Cholesterol: 157 mg/dL (ref 0–200)
HDL: 44 mg/dL (ref >39)
LDL Cholesterol: 88 mg/dL (ref 0–99)
Total CHOL/HDL Ratio: 3.6 RATIO
Triglycerides: 124 mg/dL (ref <150)
VLDL: 25 mg/dL (ref 0–40)

## 2010-11-22 LAB — DIFFERENTIAL
Basophils Absolute: 0 10*3/uL (ref 0.0–0.1)
Basophils Absolute: 0 10*3/uL (ref 0.0–0.1)
Basophils Relative: 0 % (ref 0–1)
Basophils Relative: 0 % (ref 0–1)
Eosinophils Absolute: 0.1 10*3/uL (ref 0.0–0.7)
Eosinophils Absolute: 0.1 10*3/uL (ref 0.0–0.7)
Eosinophils Relative: 1 % (ref 0–5)
Eosinophils Relative: 1 % (ref 0–5)
Lymphocytes Relative: 11 % — ABNORMAL LOW (ref 12–46)
Lymphocytes Relative: 19 % (ref 12–46)
Lymphs Abs: 1.1 10*3/uL (ref 0.7–4.0)
Lymphs Abs: 1.2 10*3/uL (ref 0.7–4.0)
Monocytes Absolute: 0.6 10*3/uL (ref 0.1–1.0)
Monocytes Absolute: 0.6 10*3/uL (ref 0.1–1.0)
Monocytes Relative: 10 % (ref 3–12)
Monocytes Relative: 6 % (ref 3–12)
Neutro Abs: 4.5 10*3/uL (ref 1.7–7.7)
Neutro Abs: 8.2 10*3/uL — ABNORMAL HIGH (ref 1.7–7.7)
Neutrophils Relative %: 70 % (ref 43–77)
Neutrophils Relative %: 82 % — ABNORMAL HIGH (ref 43–77)

## 2010-11-22 LAB — TROPONIN I: Troponin I: 0.01 ng/mL (ref 0.00–0.06)

## 2010-11-22 LAB — APTT
aPTT: 24 seconds (ref 24–37)
aPTT: 25 seconds (ref 24–37)

## 2010-11-22 LAB — PROTIME-INR
INR: 1 (ref 0.00–1.49)
INR: 1.1 (ref 0.00–1.49)
Prothrombin Time: 13.3 seconds (ref 11.6–15.2)
Prothrombin Time: 14 seconds (ref 11.6–15.2)

## 2010-11-22 LAB — HEMOGLOBIN A1C
Hgb A1c MFr Bld: 5.6 % (ref 4.6–6.1)
Mean Plasma Glucose: 114 mg/dL

## 2010-11-22 LAB — HOMOCYSTEINE: Homocysteine: 6.5 umol/L (ref 4.0–15.4)

## 2010-11-26 LAB — DIFFERENTIAL
Basophils Absolute: 0
Basophils Relative: 1
Eosinophils Absolute: 0.1 — ABNORMAL LOW
Eosinophils Relative: 2
Lymphocytes Relative: 19
Lymphs Abs: 1.2
Monocytes Absolute: 0.6
Monocytes Relative: 10
Neutro Abs: 4.1
Neutrophils Relative %: 68

## 2010-11-26 LAB — POCT CARDIAC MARKERS
CKMB, poc: <1 — ABNORMAL LOW
Myoglobin, poc: 60.9
Operator id: 230251
Troponin i, poc: <0.05

## 2010-11-26 LAB — LIPID PANEL
Cholesterol: 177
HDL: 46
LDL Cholesterol: 115 — ABNORMAL HIGH
Total CHOL/HDL Ratio: 3.8
Triglycerides: 82
VLDL: 16

## 2010-11-26 LAB — CBC
HCT: 44
Hemoglobin: 15.2
MCHC: 34.5
MCV: 94.6
Platelets: 225
RBC: 4.65
RDW: 12.8
WBC: 6

## 2010-11-26 LAB — RPR: RPR Ser Ql: NONREACTIVE

## 2010-11-26 LAB — BASIC METABOLIC PANEL
BUN: 18
CO2: 24
Calcium: 8.9
Chloride: 106
Creatinine, Ser: 0.72
GFR calc Af Amer: >60
GFR calc non Af Amer: >60
Glucose, Bld: 147 — ABNORMAL HIGH
Potassium: 4
Sodium: 138

## 2010-11-26 LAB — VITAMIN B12: Vitamin B-12: 367 (ref 211–911)

## 2010-11-26 LAB — HOMOCYSTEINE: Homocysteine: 10.1

## 2010-12-05 LAB — H. PYLORI ANTIBODY, IGG: H Pylori IgG: 0.5

## 2010-12-05 LAB — HEMOGLOBIN AND HEMATOCRIT, BLOOD
HCT: 43.4
Hemoglobin: 15.4

## 2011-05-07 ENCOUNTER — Encounter (HOSPITAL_COMMUNITY): Payer: Self-pay

## 2011-05-07 ENCOUNTER — Emergency Department (HOSPITAL_COMMUNITY)
Admission: EM | Admit: 2011-05-07 | Discharge: 2011-05-07 | Disposition: A | Discharge status: Home / Self Care | Payer: Medicare Other | Attending: Emergency Medicine | Admitting: Emergency Medicine

## 2011-05-07 ENCOUNTER — Emergency Department (HOSPITAL_COMMUNITY): Payer: Medicare Other

## 2011-05-07 DIAGNOSIS — Z7982 Long term (current) use of aspirin: Secondary | ICD-10-CM | POA: Insufficient documentation

## 2011-05-07 DIAGNOSIS — W19XXXA Unspecified fall, initial encounter: Secondary | ICD-10-CM | POA: Insufficient documentation

## 2011-05-07 DIAGNOSIS — Z79899 Other long term (current) drug therapy: Secondary | ICD-10-CM | POA: Insufficient documentation

## 2011-05-07 DIAGNOSIS — R110 Nausea: Secondary | ICD-10-CM | POA: Insufficient documentation

## 2011-05-07 DIAGNOSIS — Z8673 Personal history of transient ischemic attack (TIA), and cerebral infarction without residual deficits: Secondary | ICD-10-CM | POA: Insufficient documentation

## 2011-05-07 DIAGNOSIS — S0990XA Unspecified injury of head, initial encounter: Secondary | ICD-10-CM

## 2011-05-07 DIAGNOSIS — S060X9A Concussion with loss of consciousness of unspecified duration, initial encounter: Secondary | ICD-10-CM | POA: Insufficient documentation

## 2011-05-07 DIAGNOSIS — H538 Other visual disturbances: Secondary | ICD-10-CM | POA: Insufficient documentation

## 2011-05-07 DIAGNOSIS — R51 Headache: Secondary | ICD-10-CM | POA: Insufficient documentation

## 2011-05-07 HISTORY — DX: Cerebral infarction, unspecified: I63.9
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
## 2011-05-07 NOTE — Discharge Instructions (Signed)
Concussion and Brain Injury A blow or jolt to the head can disrupt the normal function of the brain. This type of brain injury is often called a "concussion" or a "closed head injury." Concussions are usually not life-threatening. Even so, the effects of a concussion can be serious.  CAUSES  A concussion is caused by a blunt blow to the head. The blow might be direct or indirect as described below.  Direct blow (running into another player during a soccer game, being hit in a fight, or hitting your head on a hard surface).   Indirect blow (when your head moves rapidly and violently back and forth like in a car crash).  SYMPTOMS  The brain is very complex. Every head injury is different. Some symptoms may appear right away. Other symptoms may not show up for days or weeks after the concussion. The signs of concussion can be hard to notice. Early on, problems may be missed by patients, family members, and caregivers. You may look fine even though you are acting or feeling differently.  These symptoms are usually temporary, but may last for days, weeks, or even longer. Symptoms include:  Mild headaches that will not go away.   Having more trouble than usual with:   Remembering things.   Paying attention or concentrating.   Organizing daily tasks.   Making decisions and solving problems.   Slowness in thinking, acting, speaking, or reading.   Getting lost or easily confused.   Feeling tired all the time or lacking energy (fatigue).   Feeling drowsy.   Sleep disturbances.   Sleeping more than usual.   Sleeping less than usual.   Trouble falling asleep.   Trouble sleeping (insomnia).   Loss of balance or feeling lightheaded or dizzy.   Nausea or vomiting.   Numbness or tingling.   Increased sensitivity to:   Sounds.   Lights.   Distractions.  Other symptoms might include:  Vision problems or eyes that tire easily.   Diminished sense of taste or smell.   Ringing  in the ears.   Mood changes such as feeling sad, anxious, or listless.   Becoming easily irritated or angry for little or no reason.   Lack of motivation.  DIAGNOSIS  Your caregiver can usually diagnose a concussion or mild brain injury based on your description of your injury and your symptoms.  Your evaluation might include:  A brain scan to look for signs of injury to the brain. Even if the test shows no injury, you may still have a concussion.   Blood tests to be sure other problems are not present.  TREATMENT   People with a concussion need to be examined and evaluated. Most people with concussions are treated in an emergency department, urgent care, or clinic. Some people must stay in the hospital overnight for further treatment.   Your caregiver will send you home with important instructions to follow. Be sure to carefully follow them.   Tell your caregiver if you are already taking any medicines (prescription, over-the-counter, or natural remedies), or if you are drinking alcohol or taking illegal drugs. Also, talk with your caregiver if you are taking blood thinners (anticoagulants) or aspirin. These drugs may increase your chances of complications. All of this is important information that may affect treatment.   Only take over-the-counter or prescription medicines for pain, discomfort, or fever as directed by your caregiver.  PROGNOSIS  How fast people recover from brain injury varies from person to person.   Although most people have a good recovery, how quickly they improve depends on many factors. These factors include how severe their concussion was, what part of the brain was injured, their age, and how healthy they were before the concussion.  Because all head injuries are different, so is recovery. Most people with mild injuries recover fully. Recovery can take time. In general, recovery is slower in older persons. Also, persons who have had a concussion in the past or have  other medical problems may find that it takes longer to recover from their current injury. Anxiety and depression may also make it harder to adjust to the symptoms of brain injury. HOME CARE INSTRUCTIONS  Return to your normal activities slowly, not all at once. You must give your body and brain enough time for recovery.  Get plenty of sleep at night, and rest during the day. Rest helps the brain to heal.   Avoid staying up late at night.   Keep the same bedtime hours on weekends and weekdays.   Take daytime naps or rest breaks when you feel tired.   Limit activities that require a lot of thought or concentration (brain or cognitive rest). This includes:   Homework or job-related work.   Watching TV.   Computer work.   Avoid activities that could lead to a second brain injury, such as contact or recreational sports, until your caregiver says it is okay. Even after your brain injury has healed, you should protect yourself from having another concussion.   Ask your caregiver when you can return to your normal activities such as driving, bicycling, or operating heavy equipment. Your ability to react may be slower after a brain injury.   Talk with your caregiver about when you can return to work or school.   Inform your teachers, school nurse, school counselor, coach, Product/process development scientist, or work Freight forwarder about your injury, symptoms, and restrictions. They should be instructed to report:   Increased problems with attention or concentration.   Increased problems remembering or learning new information.   Increased time needed to complete tasks or assignments.   Increased irritability or decreased ability to cope with stress.   Increased symptoms.   Take only those medicines that your caregiver has approved.   Do not drink alcohol until your caregiver says you are well enough to do so. Alcohol and certain other drugs may slow your recovery and can put you at risk of further injury.    If it is harder than usual to remember things, write them down.   If you are easily distracted, try to do one thing at a time. For example, do not try to watch TV while fixing dinner.   Talk with family members or close friends when making important decisions.   Keep all follow-up appointments. Repeated evaluation of your symptoms is recommended for your recovery.  PREVENTION  Protect your head from future injury. It is very important to avoid another head or brain injury before you have recovered. In rare cases, another injury has lead to permanent brain damage, brain swelling, or death. Avoid injuries by using:  Seatbelts when riding in a car.   Alcohol only in moderation.   A helmet when biking, skiing, skateboarding, skating, or doing similar activities.   Safety measures in your home.   Remove clutter and tripping hazards from floors and stairways.   Use grab bars in bathrooms and handrails by stairs.   Place non-slip mats on floors and in bathtubs.  Improve lighting in dim areas.  SEEK MEDICAL CARE IF:  A head injury can cause lingering symptoms. You should seek medical care if you have any of the following symptoms for more than 3 weeks after your injury or are planning to return to sports:  Chronic headaches.   Dizziness or balance problems.   Nausea.   Vision problems.   Increased sensitivity to noise or light.   Depression or mood swings.   Anxiety or irritability.   Memory problems.   Difficulty concentrating or paying attention.   Sleep problems.   Feeling tired all the time.  SEEK IMMEDIATE MEDICAL CARE IF:  You have had a blow or jolt to the head and you (or your family or friends) notice:  Severe or worsening headaches.   Weakness (even if only in one hand or one leg or one part of the face), numbness, or decreased coordination.   Repeated vomiting.   Increased sleepiness or passing out.   One black center of the eye (pupil) is larger  than the other.   Convulsions (seizures).   Slurred speech.   Increasing confusion, restlessness, agitation, or irritability.   Lack of ability to recognize people or places.   Neck pain.   Difficulty being awakened.   Unusual behavior changes.   Loss of consciousness.  Older adults with a brain injury may have a higher risk of serious complications such as a blood clot on the brain. Headaches that get worse or an increase in confusion are signs of this complication. If these signs occur, see a caregiver right away. MAKE SURE YOU:   Understand these instructions.   Will watch your condition.   Will get help right away if you are not doing well or get worse.  FOR MORE INFORMATION  Several groups help people with brain injury and their families. They provide information and put people in touch with local resources. These include support groups, rehabilitation services, and a variety of health care professionals. Among these groups, the Brain Injury Association (BIA, www.biausa.org) has a Secretary/administrator that gathers scientific and educational information and works on a national level to help people with brain injury.  Document Released: 04/26/2003 Document Revised: 01/23/2011 Document Reviewed: 09/22/2007 [facility] Patient Information 2012 [place], [place].Head Injury, Adult A head injury happens when the head is hit really hard. A head injury may cause sleepiness, headache, throwing up (vomiting), and problems seeing. If the head injury is really bad, you may need to stay in the hospital. HOME CARE  Have someone with you for the first 24 hours. This person should wake you up every 1 hour to check on your condition.   Only drink water or clear fluid for the rest of the day. Then, go back to your regular diet.   Only take medicines as told by your doctor. Do not take aspirin.   Do not drink alcohol for 2 days.   Do not take medicines that help your relax (sedatives) for 2 days.   Side effects may happen for up to 7 to 10 days. Watch for new problems. GET HELP RIGHT AWAY IF:   You are confused or sleepy.   You cannot be woken up.   You feel sick to your stomach (nauseous) or keep throwing up.   Your dizziness or unsteadiness is get worse, or your cannot walk.   You start to shake (convulse) or pass out (faint).   You have very bad, lasting headaches that are not helped by medicine.  You cannot use your arms or legs like normal.   You have clear or bloody fluid coming from your nose or ears.  MAKE SURE YOU:   Understand these instructions.   Will watch your condition.   Will get help right away if you are not doing well or get worse.  Document Released: 01/17/2008 Document Revised: 01/23/2011 Document Reviewed: 12/20/2008 [facility] Patient Information 2012 [place], [place].

## 2011-07-18 ENCOUNTER — Encounter (HOSPITAL_COMMUNITY): Payer: Self-pay | Admitting: *Deleted

## 2011-07-18 ENCOUNTER — Emergency Department (HOSPITAL_COMMUNITY): Payer: Medicare Other

## 2011-07-18 ENCOUNTER — Inpatient Hospital Stay (HOSPITAL_COMMUNITY)
Admission: EM | Admit: 2011-07-18 | Discharge: 2011-07-23 | DRG: 948 | Disposition: A | Discharge status: Home / Self Care | Payer: Medicare Other | Attending: Pulmonary Disease | Admitting: Pulmonary Disease

## 2011-07-18 DIAGNOSIS — Z9181 History of falling: Secondary | ICD-10-CM

## 2011-07-18 DIAGNOSIS — F3289 Other specified depressive episodes: Secondary | ICD-10-CM | POA: Diagnosis present

## 2011-07-18 DIAGNOSIS — R29898 Other symptoms and signs involving the musculoskeletal system: Secondary | ICD-10-CM | POA: Diagnosis present

## 2011-07-18 DIAGNOSIS — R4182 Altered mental status, unspecified: Principal | ICD-10-CM | POA: Diagnosis present

## 2011-07-18 DIAGNOSIS — R32 Unspecified urinary incontinence: Secondary | ICD-10-CM | POA: Diagnosis present

## 2011-07-18 DIAGNOSIS — Z87891 Personal history of nicotine dependence: Secondary | ICD-10-CM

## 2011-07-18 DIAGNOSIS — R209 Unspecified disturbances of skin sensation: Secondary | ICD-10-CM | POA: Diagnosis present

## 2011-07-18 DIAGNOSIS — F0781 Postconcussional syndrome: Secondary | ICD-10-CM | POA: Diagnosis present

## 2011-07-18 DIAGNOSIS — R51 Headache: Secondary | ICD-10-CM | POA: Diagnosis present

## 2011-07-18 DIAGNOSIS — R200 Anesthesia of skin: Secondary | ICD-10-CM

## 2011-07-18 DIAGNOSIS — I10 Essential (primary) hypertension: Secondary | ICD-10-CM | POA: Diagnosis present

## 2011-07-18 DIAGNOSIS — I6350 Cerebral infarction due to unspecified occlusion or stenosis of unspecified cerebral artery: Secondary | ICD-10-CM | POA: Diagnosis present

## 2011-07-18 DIAGNOSIS — F4310 Post-traumatic stress disorder, unspecified: Secondary | ICD-10-CM | POA: Diagnosis present

## 2011-07-18 DIAGNOSIS — R42 Dizziness and giddiness: Secondary | ICD-10-CM

## 2011-07-18 DIAGNOSIS — I69959 Hemiplegia and hemiparesis following unspecified cerebrovascular disease affecting unspecified side: Secondary | ICD-10-CM

## 2011-07-18 DIAGNOSIS — E785 Hyperlipidemia, unspecified: Secondary | ICD-10-CM | POA: Diagnosis present

## 2011-07-18 DIAGNOSIS — F329 Major depressive disorder, single episode, unspecified: Secondary | ICD-10-CM | POA: Diagnosis present

## 2011-07-18 DIAGNOSIS — R413 Other amnesia: Secondary | ICD-10-CM | POA: Diagnosis present

## 2011-07-18 HISTORY — DX: Anxiety disorder, unspecified: F41.9

## 2011-07-18 HISTORY — DX: Headache: R51

## 2011-07-18 LAB — DIFFERENTIAL
Basophils Absolute: 0 10*3/uL (ref 0.0–0.1)
Basophils Relative: 1 % (ref 0–1)
Eosinophils Absolute: 0.2 10*3/uL (ref 0.0–0.7)
Eosinophils Relative: 3 % (ref 0–5)
Lymphocytes Relative: 26 % (ref 12–46)
Lymphs Abs: 1.5 10*3/uL (ref 0.7–4.0)
Monocytes Absolute: 0.6 10*3/uL (ref 0.1–1.0)
Monocytes Relative: 10 % (ref 3–12)
Neutro Abs: 3.7 10*3/uL (ref 1.7–7.7)
Neutrophils Relative %: 61 % (ref 43–77)

## 2011-07-18 LAB — URINALYSIS, ROUTINE W REFLEX MICROSCOPIC
Bilirubin Urine: NEGATIVE
Glucose, UA: NEGATIVE mg/dL
Hgb urine dipstick: NEGATIVE
Ketones, ur: NEGATIVE mg/dL
Leukocytes, UA: NEGATIVE
Nitrite: NEGATIVE
Protein, ur: NEGATIVE mg/dL
Specific Gravity, Urine: >1.03 — ABNORMAL HIGH (ref 1.005–1.030)
Urobilinogen, UA: 0.2 mg/dL (ref 0.0–1.0)
pH: 5.5 (ref 5.0–8.0)

## 2011-07-18 LAB — COMPREHENSIVE METABOLIC PANEL
ALT: 38 U/L (ref 0–53)
AST: 25 U/L (ref 0–37)
Albumin: 4.1 g/dL (ref 3.5–5.2)
Alkaline Phosphatase: 64 U/L (ref 39–117)
BUN: 29 mg/dL — ABNORMAL HIGH (ref 6–23)
CO2: 26 mEq/L (ref 19–32)
Calcium: 9.9 mg/dL (ref 8.4–10.5)
Chloride: 103 mEq/L (ref 96–112)
Creatinine, Ser: 0.89 mg/dL (ref 0.50–1.35)
GFR calc Af Amer: >90 mL/min (ref >90)
GFR calc non Af Amer: 89 mL/min — ABNORMAL LOW (ref >90)
Glucose, Bld: 93 mg/dL (ref 70–99)
Potassium: 4.2 mEq/L (ref 3.5–5.1)
Sodium: 137 mEq/L (ref 135–145)
Total Bilirubin: 0.9 mg/dL (ref 0.3–1.2)
Total Protein: 6.9 g/dL (ref 6.0–8.3)

## 2011-07-18 LAB — CBC
HCT: 41.2 % (ref 39.0–52.0)
Hemoglobin: 14.3 g/dL (ref 13.0–17.0)
MCH: 32.6 pg (ref 26.0–34.0)
MCHC: 34.7 g/dL (ref 30.0–36.0)
MCV: 94.1 fL (ref 78.0–100.0)
Platelets: 196 10*3/uL (ref 150–400)
RBC: 4.38 MIL/uL (ref 4.22–5.81)
RDW: 12.1 % (ref 11.5–15.5)
WBC: 6 10*3/uL (ref 4.0–10.5)

## 2011-07-18 LAB — APTT: aPTT: 26 seconds (ref 24–37)

## 2011-07-18 LAB — PROTIME-INR
INR: 1.05 (ref 0.00–1.49)
Prothrombin Time: 13.9 seconds (ref 11.6–15.2)

## 2011-07-18 MED ORDER — SODIUM CHLORIDE 0.9 % IV SOLN
INTRAVENOUS | Status: DC
  Administered 2011-07-18: 20:00:00 via INTRAVENOUS
  Administered 2011-07-20: 20 mL/h via INTRAVENOUS
  Administered 2011-07-20: 06:00:00 via INTRAVENOUS

## 2011-07-18 MED ORDER — SODIUM CHLORIDE 0.9 % IV SOLN
INTRAVENOUS | Status: DC
  Administered 2011-07-19 (×2): via INTRAVENOUS

## 2011-07-18 NOTE — ED Notes (Signed)
Onset 11 am, dizzy , fell when outside.,  Memory loss, Does not remember talking with son.  Headache.numb/pain lt side of body,  Fall again inside.
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
## 2011-07-18 NOTE — ED Notes (Signed)
Tele physch computer taken to room
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
## 2011-07-19 ENCOUNTER — Encounter (HOSPITAL_COMMUNITY): Payer: Self-pay | Admitting: *Deleted

## 2011-07-19 DIAGNOSIS — R4182 Altered mental status, unspecified: Principal | ICD-10-CM | POA: Diagnosis present

## 2011-07-19 LAB — CBC
HCT: 41.8 % (ref 39.0–52.0)
Hemoglobin: 14.2 g/dL (ref 13.0–17.0)
MCH: 32.3 pg (ref 26.0–34.0)
MCHC: 34 g/dL (ref 30.0–36.0)
MCV: 95.2 fL (ref 78.0–100.0)
Platelets: 184 10*3/uL (ref 150–400)
RBC: 4.39 MIL/uL (ref 4.22–5.81)
RDW: 12.2 % (ref 11.5–15.5)
WBC: 5 10*3/uL (ref 4.0–10.5)

## 2011-07-19 LAB — BASIC METABOLIC PANEL
BUN: 26 mg/dL — ABNORMAL HIGH (ref 6–23)
CO2: 28 mEq/L (ref 19–32)
Calcium: 9.2 mg/dL (ref 8.4–10.5)
Chloride: 105 mEq/L (ref 96–112)
Creatinine, Ser: 0.8 mg/dL (ref 0.50–1.35)
GFR calc Af Amer: >90 mL/min (ref >90)
GFR calc non Af Amer: >90 mL/min (ref >90)
Glucose, Bld: 97 mg/dL (ref 70–99)
Potassium: 3.8 mEq/L (ref 3.5–5.1)
Sodium: 138 mEq/L (ref 135–145)

## 2011-07-19 MED ORDER — SODIUM CHLORIDE 0.9 % IJ SOLN
3.0000 mL | Freq: Two times a day (BID) | INTRAMUSCULAR | Status: DC
  Administered 2011-07-19 – 2011-07-21 (×2): 3 mL via INTRAVENOUS
  Filled 2011-07-19 (×2): qty 3

## 2011-07-19 MED ORDER — SODIUM CHLORIDE 0.9 % IJ SOLN
INTRAMUSCULAR | Status: AC
  Filled 2011-07-19: qty 3

## 2011-07-19 MED ORDER — SERTRALINE HCL 50 MG PO TABS
100.0000 mg | ORAL_TABLET | Freq: Every day | ORAL | Status: AC
  Administered 2011-07-19 – 2011-07-22 (×4): 100 mg via ORAL
  Filled 2011-07-19 (×4): qty 2

## 2011-07-19 MED ORDER — FLUOXETINE HCL 20 MG PO CAPS
20.0000 mg | ORAL_CAPSULE | Freq: Every day | ORAL | Status: AC
  Administered 2011-07-19 – 2011-07-22 (×4): 20 mg via ORAL
  Filled 2011-07-19 (×4): qty 1

## 2011-07-19 MED ORDER — ATORVASTATIN CALCIUM 40 MG PO TABS
40.0000 mg | ORAL_TABLET | Freq: Every day | ORAL | Status: DC
  Filled 2011-07-19: qty 1

## 2011-07-19 MED ORDER — SODIUM CHLORIDE 0.9 % IJ SOLN: 3.0000 mL | INTRAMUSCULAR | Status: DC | PRN

## 2011-07-19 MED ORDER — ENOXAPARIN SODIUM 40 MG/0.4ML ~~LOC~~ SOLN
40.0000 mg | SUBCUTANEOUS | Status: DC
  Administered 2011-07-19 – 2011-07-23 (×5): 40 mg via SUBCUTANEOUS
  Filled 2011-07-19 (×5): qty 0.4

## 2011-07-19 MED ORDER — ASPIRIN EC 325 MG PO TBEC
325.0000 mg | DELAYED_RELEASE_TABLET | Freq: Every day | ORAL | Status: DC
  Administered 2011-07-19 – 2011-07-23 (×5): 325 mg via ORAL
  Filled 2011-07-19 (×5): qty 1

## 2011-07-19 MED ORDER — ALUM & MAG HYDROXIDE-SIMETH 200-200-20 MG/5ML PO SUSP: 30.0000 mL | Freq: Four times a day (QID) | ORAL | Status: DC | PRN

## 2011-07-19 MED ORDER — ONDANSETRON HCL 4 MG PO TABS: 4.0000 mg | ORAL_TABLET | Freq: Four times a day (QID) | ORAL | Status: DC | PRN

## 2011-07-19 MED ORDER — SODIUM CHLORIDE 0.9 % IV SOLN: 250.0000 mL | INTRAVENOUS | Status: DC | PRN

## 2011-07-19 MED ORDER — ONDANSETRON HCL 4 MG/2ML IJ SOLN: 4.0000 mg | Freq: Four times a day (QID) | INTRAMUSCULAR | Status: DC | PRN

## 2011-07-19 MED ORDER — ADULT MULTIVITAMIN W/MINERALS CH
1.0000 | ORAL_TABLET | Freq: Every day | ORAL | Status: DC
  Administered 2011-07-19 – 2011-07-20 (×2): 1 via ORAL
  Administered 2011-07-21: 10:00:00 via ORAL
  Administered 2011-07-22 – 2011-07-23 (×2): 1 via ORAL

## 2011-07-19 MED ORDER — CLONAZEPAM 0.5 MG PO TABS
1.0000 mg | ORAL_TABLET | Freq: Three times a day (TID) | ORAL | Status: DC | PRN
  Administered 2011-07-19: 1 mg via ORAL
  Filled 2011-07-19 (×2): qty 1

## 2011-07-19 MED ORDER — ROSUVASTATIN CALCIUM 20 MG PO TABS
20.0000 mg | ORAL_TABLET | Freq: Every day | ORAL | Status: DC
  Administered 2011-07-19 – 2011-07-22 (×4): 20 mg via ORAL
  Filled 2011-07-19 (×4): qty 1

## 2011-07-19 MED ORDER — OMEGA-3-ACID ETHYL ESTERS 1 G PO CAPS
1.0000 g | ORAL_CAPSULE | Freq: Every day | ORAL | Status: DC
  Administered 2011-07-19: 1 g via ORAL
  Filled 2011-07-19: qty 1

## 2011-07-19 MED ORDER — VITAMIN B-1 100 MG PO TABS
100.0000 mg | ORAL_TABLET | Freq: Every day | ORAL | Status: DC
  Administered 2011-07-19 – 2011-07-23 (×5): 100 mg via ORAL
  Filled 2011-07-19 (×5): qty 1

## 2011-07-19 MED ORDER — OMEGA-3-ACID ETHYL ESTERS 1 G PO CAPS
1.0000 g | ORAL_CAPSULE | Freq: Two times a day (BID) | ORAL | Status: DC
  Administered 2011-07-19 – 2011-07-23 (×8): 1 g via ORAL
  Filled 2011-07-19 (×8): qty 1

## 2011-07-19 MED ORDER — ZOLPIDEM TARTRATE 5 MG PO TABS: 5.0000 mg | ORAL_TABLET | Freq: Every evening | ORAL | Status: DC | PRN

## 2011-07-19 MED ORDER — HYDROCODONE-ACETAMINOPHEN 5-325 MG PO TABS
1.0000 | ORAL_TABLET | ORAL | Status: DC | PRN
  Administered 2011-07-19 (×2): 1 via ORAL
  Administered 2011-07-19 – 2011-07-21 (×6): 2 via ORAL
  Administered 2011-07-22 (×2): 1 via ORAL
  Administered 2011-07-22 – 2011-07-23 (×2): 2 via ORAL
  Filled 2011-07-19 (×2): qty 2
  Filled 2011-07-19: qty 1
  Filled 2011-07-19: qty 2
  Filled 2011-07-19: qty 1
  Filled 2011-07-19: qty 2
  Filled 2011-07-19: qty 1
  Filled 2011-07-19 (×2): qty 2
  Filled 2011-07-19: qty 1
  Filled 2011-07-19 (×2): qty 2

## 2011-07-19 MED ORDER — ACETAMINOPHEN 500 MG PO TABS: 1000.0000 mg | ORAL_TABLET | Freq: Four times a day (QID) | ORAL | Status: DC | PRN

## 2011-07-19 NOTE — Progress Notes (Signed)
Subjective: He was admitted last night with altered mental status. He had amnesia. He had patella neurology consultation it was felt that this might be some sort of a complex partial seizure. He is doing okay this morning. He will need further workup including EEG and MRI.  Objective: Vital signs in last 24 hours: Temp:  [97.5 F (36.4 C)-97.9 F (36.6 C)] 97.5 F (36.4 C) (06/01 0500) Pulse Rate:  [44-56] 44  (06/01 0500) Resp:  [13-21] 20  (06/01 0500) BP: (99-137)/(59-82) 137/78 mmHg (06/01 0500) SpO2:  [93 %-98 %] 98 % (06/01 0500) Weight:  [94.2 kg (207 lb 10.8 oz)-99.9 kg (220 lb 3.8 oz)] 99.9 kg (220 lb 3.8 oz) (06/01 0500) Weight change:     Intake/Output from previous day: 05/31 0701 - 06/01 0700 In: 240 [P.O.:240] Out: -   PHYSICAL EXAM General appearance: alert, cooperative and no distress Resp: clear to auscultation bilaterally Cardio: regular rate and rhythm, S1, S2 normal, no murmur, click, rub or gallop GI: soft, non-tender; bowel sounds normal; no masses,  no organomegaly Extremities: extremities normal, atraumatic, no cyanosis or edema  Lab Results:    Basic Metabolic Panel:  Basename 07/19/11 0630 07/18/11 1942  NA 138 137  K 3.8 4.2  CL 105 103  CO2 28 26  GLUCOSE 97 93  BUN 26* 29*  CREATININE 0.80 0.89  CALCIUM 9.2 9.9  MG -- --  PHOS -- --   Liver Function Tests:  [facility] 07/18/11 1942  AST 25  ALT 38  ALKPHOS 64  BILITOT 0.9  PROT 6.9  ALBUMIN 4.1   No results found for this basename: LIPASE:2,AMYLASE:2 in the last 72 hours No results found for this basename: AMMONIA:2 in the last 72 hours CBC:  Basename 07/19/11 0630 07/18/11 1942  WBC 5.0 6.0  NEUTROABS -- 3.7  HGB 14.2 14.3  HCT 41.8 41.2  MCV 95.2 94.1  PLT 184 196   Cardiac Enzymes: No results found for this basename: CKTOTAL:3,CKMB:3,CKMBINDEX:3,TROPONINI:3 in the last 72 hours BNP: No results found for this basename: PROBNP:3 in the last 72 hours D-Dimer: No  results found for this basename: DDIMER:2 in the last 72 hours CBG: No results found for this basename: GLUCAP:6 in the last 72 hours Hemoglobin A1C: No results found for this basename: HGBA1C in the last 72 hours Fasting Lipid Panel: No results found for this basename: CHOL,HDL,LDLCALC,TRIG,CHOLHDL,LDLDIRECT in the last 72 hours Thyroid Function Tests: No results found for this basename: TSH,T4TOTAL,FREET4,T3FREE,THYROIDAB in the last 72 hours Anemia Panel: No results found for this basename: VITAMINB12,FOLATE,FERRITIN,TIBC,IRON,RETICCTPCT in the last 72 hours Coagulation:  Basename 07/18/11 1942  LABPROT 13.9  INR 1.05   Urine Drug Screen: Drugs of Abuse  No results found for this basename: labopia, cocainscrnur, labbenz, amphetmu, thcu, labbarb    Alcohol Level: No results found for this basename: ETH:2 in the last 72 hours Urinalysis:  Basename 07/18/11 1948  COLORURINE YELLOW  LABSPEC >1.030*  PHURINE 5.5  GLUCOSEU NEGATIVE  HGBUR NEGATIVE  BILIRUBINUR NEGATIVE  KETONESUR NEGATIVE  PROTEINUR NEGATIVE  UROBILINOGEN 0.2  NITRITE NEGATIVE  LEUKOCYTESUR NEGATIVE   Misc. Labs:  ABGS No results found for this basename: PHART,PCO2,PO2ART,TCO2,HCO3 in the last 72 hours CULTURES No results found for this or any previous visit (from the past 240 hour(s)). Studies/Results: Ct Head Wo Contrast  07/18/2011  *RADIOLOGY REPORT*  Clinical Data: Left-sided weakness.  Altered mental status.  CT HEAD WITHOUT CONTRAST  Technique:  Contiguous axial images were obtained from the base of the  skull through the vertex without contrast.  Comparison: 05/07/2011.  Findings: The ventricles and subarachnoid spaces remain mildly prominent.  No intracranial hemorrhage, mass lesion or CT evidence of acute infarction.  Unremarkable bones and included paranasal sinuses.  IMPRESSION:  1.  No acute abnormality. 2.  Stable mild atrophy.  Original Report Authenticated By: Darrol Angel, M.D.     Medications:  Prior to Admission:  Prescriptions prior to admission  Medication Sig Dispense Refill  . acetaminophen (TYLENOL) 500 MG tablet Take 1,000 mg by mouth every 6 (six) hours as needed. For pain      . aspirin EC 325 MG tablet Take 325 mg by mouth daily.      . clonazePAM (KLONOPIN) 1 MG tablet Take 1 mg by mouth 3 (three) times daily as needed. For anxiety      . FLUoxetine (PROZAC) 20 MG capsule Take 20 mg by mouth daily.       . Multiple Vitamins-Minerals (CENTRUM SILVER ULTRA MENS PO) Take 1 tablet by mouth daily.      . Omega-3 Fatty Acids (FISH OIL) 1200 MG CAPS Take 1 capsule by mouth 2 (two) times daily.      . rosuvastatin (CRESTOR) 20 MG tablet Take 20 mg by mouth at bedtime.      . sertraline (ZOLOFT) 100 MG tablet Take 100 mg by mouth daily.      Marland Kitchen thiamine (VITAMIN B-1) 100 MG tablet Take 100 mg by mouth daily.       Scheduled:   . aspirin EC  325 mg Oral Daily  . atorvastatin  40 mg Oral q1800  . enoxaparin  40 mg Subcutaneous Q24H  . FLUoxetine  20 mg Oral Daily  . mulitivitamin with minerals  1 tablet Oral Daily  . omega-3 acid ethyl esters  1 g Oral Daily  . sertraline  100 mg Oral Daily  . sodium chloride  3 mL Intravenous Q12H  . thiamine  100 mg Oral Daily    Assesment: He had change in mental status. He has had a previous stroke. He has posttraumatic stress disorder. Active Problems:  * No active hospital problems. *     Plan: Continue with current fluids observation et Karie Soda. I did not start on medications for seizures yet. I don't think it's safe for him to go home    LOS: 1 day   Rayshard Schirtzinger L 07/19/2011, 10:18 AM

## 2011-07-20 LAB — LIPID PANEL
Cholesterol: 107 mg/dL (ref 0–200)
HDL: 44 mg/dL (ref >39)
LDL Cholesterol: 35 mg/dL (ref 0–99)
Total CHOL/HDL Ratio: 2.4 RATIO
Triglycerides: 142 mg/dL (ref <150)
VLDL: 28 mg/dL (ref 0–40)

## 2011-07-20 NOTE — Progress Notes (Signed)
Subjective: He has no new complaints except he didn't sleep well last night. He still has intermittent headaches. He has numbness of his left hand. He still has amnesia of some of the events of 2 days ago. He has not had any seizures.  Objective: Vital signs in last 24 hours: Temp:  [97 F (36.1 C)-98 F (36.7 C)] 97 F (36.1 C) (06/02 0500) Pulse Rate:  [52-67] 53  (06/02 0500) Resp:  [20] 20  (06/02 0500) BP: (110-180)/(45-74) 180/45 mmHg (06/02 0500) SpO2:  [93 %-97 %] 97 % (06/02 0500) Weight:  [102.9 kg (226 lb 13.7 oz)] 102.9 kg (226 lb 13.7 oz) (06/02 0500) Weight change: 5.377 kg (11 lb 13.7 oz) Last BM Date: 07/18/11  Intake/Output from previous day: 06/01 0701 - 06/02 0700 In: 5062.5 [P.O.:775; I.V.:4287.5] Out: 400 [Urine:400]  PHYSICAL EXAM General appearance: alert, cooperative, no distress and moderately obese Resp: clear to auscultation bilaterally Cardio: regular rate and rhythm, S1, S2 normal, no murmur, click, rub or gallop GI: soft, non-tender; bowel sounds normal; no masses,  no organomegaly Extremities: extremities normal, atraumatic, no cyanosis or edema  Lab Results:    Basic Metabolic Panel:  Basename 07/19/11 0630 07/18/11 1942  NA 138 137  K 3.8 4.2  CL 105 103  CO2 28 26  GLUCOSE 97 93  BUN 26* 29*  CREATININE 0.80 0.89  CALCIUM 9.2 9.9  MG -- --  PHOS -- --   Liver Function Tests:  [facility] 07/18/11 1942  AST 25  ALT 38  ALKPHOS 64  BILITOT 0.9  PROT 6.9  ALBUMIN 4.1   No results found for this basename: LIPASE:2,AMYLASE:2 in the last 72 hours No results found for this basename: AMMONIA:2 in the last 72 hours CBC:  Basename 07/19/11 0630 07/18/11 1942  WBC 5.0 6.0  NEUTROABS -- 3.7  HGB 14.2 14.3  HCT 41.8 41.2  MCV 95.2 94.1  PLT 184 196   Cardiac Enzymes: No results found for this basename: CKTOTAL:3,CKMB:3,CKMBINDEX:3,TROPONINI:3 in the last 72 hours BNP: No results found for this basename: PROBNP:3 in the last 72  hours D-Dimer: No results found for this basename: DDIMER:2 in the last 72 hours CBG: No results found for this basename: GLUCAP:6 in the last 72 hours Hemoglobin A1C: No results found for this basename: HGBA1C in the last 72 hours Fasting Lipid Panel:  Basename 07/20/11 0630  CHOL 107  HDL 44  LDLCALC 35  TRIG 142  CHOLHDL 2.4  LDLDIRECT --   Thyroid Function Tests: No results found for this basename: TSH,T4TOTAL,FREET4,T3FREE,THYROIDAB in the last 72 hours Anemia Panel: No results found for this basename: VITAMINB12,FOLATE,FERRITIN,TIBC,IRON,RETICCTPCT in the last 72 hours Coagulation:  Basename 07/18/11 1942  LABPROT 13.9  INR 1.05   Urine Drug Screen: Drugs of Abuse  No results found for this basename: labopia, cocainscrnur, labbenz, amphetmu, thcu, labbarb    Alcohol Level: No results found for this basename: ETH:2 in the last 72 hours Urinalysis:  Basename 07/18/11 1948  COLORURINE YELLOW  LABSPEC >1.030*  PHURINE 5.5  GLUCOSEU NEGATIVE  HGBUR NEGATIVE  BILIRUBINUR NEGATIVE  KETONESUR NEGATIVE  PROTEINUR NEGATIVE  UROBILINOGEN 0.2  NITRITE NEGATIVE  LEUKOCYTESUR NEGATIVE   Misc. Labs:  ABGS No results found for this basename: PHART,PCO2,PO2ART,TCO2,HCO3 in the last 72 hours CULTURES No results found for this or any previous visit (from the past 240 hour(s)). Studies/Results: Ct Head Wo Contrast  07/18/2011  *RADIOLOGY REPORT*  Clinical Data: Left-sided weakness.  Altered mental status.  CT HEAD WITHOUT  CONTRAST  Technique:  Contiguous axial images were obtained from the base of the skull through the vertex without contrast.  Comparison: 05/07/2011.  Findings: The ventricles and subarachnoid spaces remain mildly prominent.  No intracranial hemorrhage, mass lesion or CT evidence of acute infarction.  Unremarkable bones and included paranasal sinuses.  IMPRESSION:  1.  No acute abnormality. 2.  Stable mild atrophy.  Original Report Authenticated By: Darrol Angel, M.D.    Medications:  Prior to Admission:  Prescriptions prior to admission  Medication Sig Dispense Refill  . acetaminophen (TYLENOL) 500 MG tablet Take 1,000 mg by mouth every 6 (six) hours as needed. For pain      . aspirin EC 325 MG tablet Take 325 mg by mouth daily.      . clonazePAM (KLONOPIN) 1 MG tablet Take 1 mg by mouth 3 (three) times daily as needed. For anxiety      . FLUoxetine (PROZAC) 20 MG capsule Take 20 mg by mouth daily.       . Multiple Vitamins-Minerals (CENTRUM SILVER ULTRA MENS PO) Take 1 tablet by mouth daily.      . Omega-3 Fatty Acids (FISH OIL) 1200 MG CAPS Take 1 capsule by mouth 2 (two) times daily.      . rosuvastatin (CRESTOR) 20 MG tablet Take 20 mg by mouth at bedtime.      . sertraline (ZOLOFT) 100 MG tablet Take 100 mg by mouth daily.      Marland Kitchen thiamine (VITAMIN B-1) 100 MG tablet Take 100 mg by mouth daily.       Scheduled:   . aspirin EC  325 mg Oral Daily  . enoxaparin  40 mg Subcutaneous Q24H  . FLUoxetine  20 mg Oral Daily  . mulitivitamin with minerals  1 tablet Oral Daily  . omega-3 acid ethyl esters  1 g Oral BID  . rosuvastatin  20 mg Oral q1800  . sertraline  100 mg Oral Daily  . sodium chloride  3 mL Intravenous Q12H  . sodium chloride      . thiamine  100 mg Oral Daily  . DISCONTD: atorvastatin  40 mg Oral q1800  . DISCONTD: omega-3 acid ethyl esters  1 g Oral Daily   Continuous:   . sodium chloride 125 mL/hr at 07/20/11 0605  . DISCONTD: sodium chloride 160 mL/hr at 07/19/11 1342   ZOX:WRUEAV chloride, acetaminophen, alum & mag hydroxide-simeth, clonazePAM, HYDROcodone-acetaminophen, ondansetron (ZOFRAN) IV, ondansetron, sodium chloride, zolpidem  Assesment: He was admitted with altered mental status. He has hypertension and hyperlipidemia. He has posttraumatic stress disorder and pretty severe problems with anxiety and depression. He had a stroke and has residual effects from that. Principal Problem:  *Altered mental  status Active Problems:  PTSD  DEPRESSION  HYPERTENSION  CVA    Plan: He'll have MRI tomorrow EEG tomorrow neurology consultation tomorrow    LOS: 2 days   Sheena Simonis L 07/20/2011, 10:09 AM

## 2011-07-21 ENCOUNTER — Inpatient Hospital Stay (HOSPITAL_COMMUNITY)
Admit: 2011-07-21 | Discharge: 2011-07-21 | Disposition: A | Discharge status: Home / Self Care | Payer: Medicare Other | Attending: Pulmonary Disease | Admitting: Pulmonary Disease

## 2011-07-21 ENCOUNTER — Inpatient Hospital Stay (HOSPITAL_COMMUNITY): Payer: Medicare Other

## 2011-07-21 LAB — VITAMIN B12: Vitamin B-12: 459 pg/mL (ref 211–911)

## 2011-07-21 LAB — TSH: TSH: 0.831 u[IU]/mL (ref 0.350–4.500)

## 2011-07-21 MED ORDER — SERTRALINE HCL 50 MG PO TABS
150.0000 mg | ORAL_TABLET | Freq: Every day | ORAL | Status: DC
  Administered 2011-07-23: 150 mg via ORAL
  Filled 2011-07-21: qty 3

## 2011-07-21 MED ORDER — SODIUM CHLORIDE 0.9 % IJ SOLN
INTRAMUSCULAR | Status: AC
  Administered 2011-07-21: 3 mL
  Filled 2011-07-21: qty 3

## 2011-07-21 MED ORDER — GADOBENATE DIMEGLUMINE 529 MG/ML IV SOLN
20.0000 mL | Freq: Once | INTRAVENOUS | Status: AC | PRN
  Administered 2011-07-21: 20 mL via INTRAVENOUS

## 2011-07-21 NOTE — Progress Notes (Signed)
Portable EEG completed at [facility]. 

## 2011-07-21 NOTE — Progress Notes (Signed)
PHARMACIST - PHYSICIAN COMMUNICATION DR:   Juanetta Gosling CONCERNING:  Zoloft/Prozac orders  DESCRIPTION: Patient has been started titrating up Zoloft dose while tapering off Prozac.   He has communicated that on Wednesday, June 6th he is to  titrate up to Zoloft 150 mg po daily and stop taking the Prozac.  Orders have been updated to include this plan.  Junita Push, PharmD, BCPS
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
## 2011-07-21 NOTE — Progress Notes (Signed)
Subjective: He says he feels fairly well except he still has headaches. He has no new complaints. He is still having episodes of incontinence.  Objective: Vital signs in last 24 hours: Temp:  [97.8 F (36.6 C)-98.2 F (36.8 C)] 98 F (36.7 C) (06/03 0547) Pulse Rate:  [51-68] 51  (06/03 0547) Resp:  [18] 18  (06/03 0547) BP: (103-113)/(55-66) 113/66 mmHg (06/03 0547) SpO2:  [94 %-98 %] 96 % (06/03 0547) Weight:  [101.8 kg (224 lb 6.9 oz)-102 kg (224 lb 13.9 oz)] 101.8 kg (224 lb 6.9 oz) (06/03 0547) Weight change: -0.9 kg (-1 lb 15.7 oz) Last BM Date: 07/20/11  Intake/Output from previous day: 06/02 0701 - 06/03 0700 In: 1440 [P.O.:1440] Out: 2850 [Urine:2850]  PHYSICAL EXAM General appearance: alert, cooperative and no distress Resp: clear to auscultation bilaterally Cardio: regular rate and rhythm, S1, S2 normal, no murmur, click, rub or gallop GI: soft, non-tender; bowel sounds normal; no masses,  no organomegaly Extremities: extremities normal, atraumatic, no cyanosis or edema  Lab Results:    Basic Metabolic Panel:  Basename 07/19/11 0630 07/18/11 1942  NA 138 137  K 3.8 4.2  CL 105 103  CO2 28 26  GLUCOSE 97 93  BUN 26* 29*  CREATININE 0.80 0.89  CALCIUM 9.2 9.9  MG -- --  PHOS -- --   Liver Function Tests:  [facility] 07/18/11 1942  AST 25  ALT 38  ALKPHOS 64  BILITOT 0.9  PROT 6.9  ALBUMIN 4.1   No results found for this basename: LIPASE:2,AMYLASE:2 in the last 72 hours No results found for this basename: AMMONIA:2 in the last 72 hours CBC:  Basename 07/19/11 0630 07/18/11 1942  WBC 5.0 6.0  NEUTROABS -- 3.7  HGB 14.2 14.3  HCT 41.8 41.2  MCV 95.2 94.1  PLT 184 196   Cardiac Enzymes: No results found for this basename: CKTOTAL:3,CKMB:3,CKMBINDEX:3,TROPONINI:3 in the last 72 hours BNP: No results found for this basename: PROBNP:3 in the last 72 hours D-Dimer: No results found for this basename: DDIMER:2 in the last 72 hours CBG: No  results found for this basename: GLUCAP:6 in the last 72 hours Hemoglobin A1C: No results found for this basename: HGBA1C in the last 72 hours Fasting Lipid Panel:  Basename 07/20/11 0630  CHOL 107  HDL 44  LDLCALC 35  TRIG 142  CHOLHDL 2.4  LDLDIRECT --   Thyroid Function Tests: No results found for this basename: TSH,T4TOTAL,FREET4,T3FREE,THYROIDAB in the last 72 hours Anemia Panel: No results found for this basename: VITAMINB12,FOLATE,FERRITIN,TIBC,IRON,RETICCTPCT in the last 72 hours Coagulation:  Basename 07/18/11 1942  LABPROT 13.9  INR 1.05   Urine Drug Screen: Drugs of Abuse  No results found for this basename: labopia, cocainscrnur, labbenz, amphetmu, thcu, labbarb    Alcohol Level: No results found for this basename: ETH:2 in the last 72 hours Urinalysis:  Basename 07/18/11 1948  COLORURINE YELLOW  LABSPEC >1.030*  PHURINE 5.5  GLUCOSEU NEGATIVE  HGBUR NEGATIVE  BILIRUBINUR NEGATIVE  KETONESUR NEGATIVE  PROTEINUR NEGATIVE  UROBILINOGEN 0.2  NITRITE NEGATIVE  LEUKOCYTESUR NEGATIVE   Misc. Labs:  ABGS No results found for this basename: PHART,PCO2,PO2ART,TCO2,HCO3 in the last 72 hours CULTURES No results found for this or any previous visit (from the past 240 hour(s)). Studies/Results: No results found.  Medications:  Prior to Admission:  Prescriptions prior to admission  Medication Sig Dispense Refill  . acetaminophen (TYLENOL) 500 MG tablet Take 1,000 mg by mouth every 6 (six) hours as needed. For pain      .  aspirin EC 325 MG tablet Take 325 mg by mouth daily.      . clonazePAM (KLONOPIN) 1 MG tablet Take 1 mg by mouth 3 (three) times daily as needed. For anxiety      . FLUoxetine (PROZAC) 20 MG capsule Take 20 mg by mouth daily.       . Multiple Vitamins-Minerals (CENTRUM SILVER ULTRA MENS PO) Take 1 tablet by mouth daily.      . Omega-3 Fatty Acids (FISH OIL) 1200 MG CAPS Take 1 capsule by mouth 2 (two) times daily.      . rosuvastatin  (CRESTOR) 20 MG tablet Take 20 mg by mouth at bedtime.      . sertraline (ZOLOFT) 100 MG tablet Take 100 mg by mouth daily.      Marland Kitchen thiamine (VITAMIN B-1) 100 MG tablet Take 100 mg by mouth daily.       Scheduled:   . aspirin EC  325 mg Oral Daily  . enoxaparin  40 mg Subcutaneous Q24H  . FLUoxetine  20 mg Oral Daily  . mulitivitamin with minerals  1 tablet Oral Daily  . omega-3 acid ethyl esters  1 g Oral BID  . rosuvastatin  20 mg Oral q1800  . sertraline  100 mg Oral Daily  . sodium chloride  3 mL Intravenous Q12H  . sodium chloride      . sodium chloride      . thiamine  100 mg Oral Daily   Continuous:   . sodium chloride 20 mL/hr (07/20/11 1315)  . DISCONTD: sodium chloride 160 mL/hr at 07/19/11 1342   WUJ:WJXBJY chloride, acetaminophen, alum & mag hydroxide-simeth, clonazePAM, HYDROcodone-acetaminophen, ondansetron (ZOFRAN) IV, ondansetron, sodium chloride, zolpidem  Assesment: He came in with altered mental status perhaps some sort of complicated seizure disorder TIA etc. He is being worked up for this. He continues to have headache and incontinence Principal Problem:  *Altered mental status Active Problems:  PTSD  DEPRESSION  HYPERTENSION  CVA    Plan: He's had MRI of the brain today. He will have neurology consultation today.    LOS: 3 days   Briteny Fulghum L 07/21/2011, 8:35 AM
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
## 2011-07-21 NOTE — Care Management Note (Signed)
    Page 1 of 1   07/23/2011     1:38:45 PM   CARE MANAGEMENT NOTE 07/23/2011  Patient:  [facility]   Account Number:  [id number]  Date Initiated:  07/21/2011  Documentation initiated by:  Sharrie Rothman  Subjective/Objective Assessment:   Pt admitted from home with possible TIA. Pt lives with wife and is fairly indpendent with ADL's. Pt uses quad cane. Pt will return home at discharge.     Action/Plan:   NO CM or HH needs noted at this time.   Anticipated DC Date:  07/25/2011   Anticipated DC Plan:  HOME/SELF CARE      DC Planning Services  CM consult      Choice offered to / List presented to:             Status of service:  Completed, signed off Medicare Important Message given?   (If response is "NO", the following Medicare IM given date fields will be blank) Date Medicare IM given:   Date Additional Medicare IM given:    Discharge Disposition:  HOME/SELF CARE  Per UR Regulation:    If discussed at Long Length of Stay Meetings, dates discussed:    Comments:  07/23/11 1337 Coleman Kalas, RN BSN CM Pt discharged home today. No CM needs noted.  07/21/11 1454 Arlyss Queen, RN BSN CM

## 2011-07-21 NOTE — Progress Notes (Signed)
UR Chart Review Completed  

## 2011-07-22 LAB — RPR: RPR Ser Ql: NONREACTIVE

## 2011-07-22 LAB — HOMOCYSTEINE: Homocysteine: 7.1 umol/L (ref 4.0–15.4)

## 2011-07-22 NOTE — Procedures (Signed)
NAMEMarland Kitchen  MARCH, [name] NO.:  [id number]  MEDICAL RECORD NO.:  [id number]  LOCATION:  A307                          FACILITY:  [facility]  PHYSICIAN:  Tremar Wickens A. Gerilyn Pilgrim, M.D. DATE OF BIRTH:  [birth year]  DATE OF PROCEDURE: DATE OF DISCHARGE:                             EEG INTERPRETATION   INDICATIONS:  This is a 61 year old man who presents with a spell of confusion.  The study is being done to evaluate for possible seizures.  MEDICATIONS:  Aspirin, Lovenox, Crestor, Zoloft.  ANALYSIS:  A 16-channel recording using standard 10/20 measurement is conducted for 21 minutes.  There is a well-formed posterior dominant rhythm of 11.5 Hz which attenuates with eye opening.  Only awake and some drowsy activities are recorded.  Photic stimulation and hyperventilation of above carried out without abnormal changes in the background activity.  There is no focal or lateralized slowing.  There is no epileptiform activity observed.  IMPRESSION:  Normal recording of awake and drowsy states.     [name] A. Gerilyn Pilgrim, M.D.     KAD/MEDQ  D:  07/22/2011  T:  07/22/2011  Job:  [id number]
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
## 2011-07-22 NOTE — Progress Notes (Signed)
Subjective: Interval History:   Objective: Vital signs in last 24 hours: Temp:  [97 F (36.1 C)-98.1 F (36.7 C)] 98.1 F (36.7 C) (06/04 1500) Pulse Rate:  [40-63] 50  (06/04 1500) Resp:  [16-19] 16  (06/04 1500) BP: (95-134)/(59-76) 117/73 mmHg (06/04 1500) SpO2:  [95 %-97 %] 97 % (06/04 1500) Weight:  [99.2 kg (218 lb 11.1 oz)] 99.2 kg (218 lb 11.1 oz) (06/04 0551)  Intake/Output from previous day: 06/03 0701 - 06/04 0700 In: 1000 [P.O.:840; I.V.:160] Out: 1900 [Urine:1900] Intake/Output this shift:   Nutritional status: Cardiac    Lab Results: No results found for this basename: WBC:2,HGB:2,HCT:2,PLT:2,NA:2,K:2,CL:2,CO2:2,GLUCOSE:2,BUN:2,CREATININE:2,CALCIUM:2,LABA1C in the last 72 hours Lipid Panel  Basename 07/20/11 0630  CHOL 107  TRIG 142  HDL 44  CHOLHDL 2.4  VLDL 28  LDLCALC 35    Studies/Results: Mr Laqueta Jean Wo Contrast  07/21/2011  *RADIOLOGY REPORT*  Clinical Data: Altered mental status.  Hypertension.  PTSD.  MRI HEAD WITHOUT AND WITH CONTRAST  Technique:  Multiplanar, multiecho pulse sequences of the brain and surrounding structures were obtained according to standard protocol without and with intravenous contrast  Contrast: 20mL MULTIHANCE GADOBENATE DIMEGLUMINE 529 MG/ML IV SOLN  Comparison: CT head 07/18/2011.  MRI brain 06/20/2008.  Findings: There is no evidence for acute infarction, intracranial hemorrhage, mass lesion, hydrocephalus, or extra-axial fluid.  Mild atrophy is present.  There is slight chronic microvascular ischemic change primarily the periventricular white matter.  Normal pituitary, pineal, and cerebellar tonsils. Small bilateral remote cerebellar infarcts.  Slight pannus surrounds the odontoid.  No foci of chronic hemorrhage.  No midline shift.  Major intracranial vascular structures patent.  Post contrast, no abnormal intracranial enhancement.  No acute sinus or mastoid disease. Similar appearance to prior MR and CT.  IMPRESSION: Mild  atrophy and chronic microvascular ischemic change.  No acute stroke.  Original Report Authenticated By: Elsie Stain, M.D.    Medications:  Scheduled Meds:   . aspirin EC  325 mg Oral Daily  . enoxaparin  40 mg Subcutaneous Q24H  . FLUoxetine  20 mg Oral Daily  . mulitivitamin with minerals  1 tablet Oral Daily  . omega-3 acid ethyl esters  1 g Oral BID  . rosuvastatin  20 mg Oral q1800  . sertraline  100 mg Oral Daily  . sertraline  150 mg Oral Daily  . sodium chloride  3 mL Intravenous Q12H  . thiamine  100 mg Oral Daily   Continuous Infusions:  PRN Meds:.sodium chloride, acetaminophen, alum & mag hydroxide-simeth, clonazePAM, HYDROcodone-acetaminophen, ondansetron (ZOFRAN) IV, ondansetron, sodium chloride, zolpidem   Assessment/Plan: See dict   LOS: 4 days   [name]

## 2011-07-23 MED ORDER — DONEPEZIL HCL 5 MG PO TABS: 5.0000 mg | ORAL_TABLET | Freq: Every evening | ORAL | Status: DC | PRN

## 2011-07-23 NOTE — Progress Notes (Signed)
NAMEMarland Kitchen  BENTLIE, CATANZARO NO.:  [id number]  MEDICAL RECORD NO.:  [id number]  LOCATION:  A307                          FACILITY:  [facility]  PHYSICIAN:  Nazly Digilio L. Juanetta Gosling, M.D.DATE OF BIRTH:  09/12/1950  DATE OF PROCEDURE:  07/22/2011 DATE OF DISCHARGE:                                PROGRESS NOTE   Ms. Pursifull says he is about the same.  He had MRI scanning and he has had an EEG.  We are waiting for EEG interpretation and I have discussed his situation again with Dr. Gerilyn Pilgrim who will see him later.  He continues to complain of a headache and what he describes as cor pain.  PHYSICAL EXAMINATION:  GENERAL:  He is awake and alert.  He looks relatively comfortable.  He has some left-sided weakness which is chronic. HEART:  Regular. ABDOMEN:  Soft. VITAL SIGNS:  His temperature is 98.1, pulse 63, respirations 20, blood pressure 147/87, O2 sats in the 90s.  ASSESSMENT:  He is improving.  He may be able to be discharged home tomorrow depending on recommendations from Dr. Gerilyn Pilgrim.     Solae Norling L. Juanetta Gosling, M.D.     ELH/MEDQ  D:  07/23/2011  T:  07/23/2011  Job:  [id number]

## 2011-07-23 NOTE — Progress Notes (Signed)
NAMEMarland Kitchen  Jose, Johnson NO.:  [id number]  MEDICAL RECORD NO.:  [id number]  LOCATION:  A307                          FACILITY:  [facility]  PHYSICIAN:  Elmon Shader L. Juanetta Gosling, M.D.DATE OF BIRTH:  [birth year]  DATE OF PROCEDURE: DATE OF DISCHARGE:  07/23/2011                                PROGRESS NOTE   [name] says he feels better and wants to go home.  He has no new complaints.  PHYSICAL EXAMINATION:  VITAL SIGNS:  His temperature is 97.8, pulse 46, respirations 20, blood pressure 127/78, O2 sats 97%. CHEST:  Clear. HEART:  Regular. ABDOMEN:  Soft. NEUROLOGICAL:  He is unchanged.  ASSESSMENT:  Then is that, he is about the same.  He does not apparently have seizures.  He has some post concussion syndrome, but basically things are better, and I am going to plan to discharge him home.  Please see discharge summary for details.     [name] L. Juanetta Gosling, M.D.     ELH/MEDQ  D:  07/23/2011  T:  07/23/2011  Job:  [id number]

## 2011-07-23 NOTE — Progress Notes (Signed)
Patient received discharge instructions along with follow up appointments and prescriptions. Patient verbalized understanding of all instructions. Patient was escorted by staff via wheelchair to vehicle. Patient discharged to home in stable condition. 

## 2011-07-24 NOTE — Discharge Summary (Signed)
Physician Discharge Summary  Patient ID: [name] MRN: [id number] DOB/AGE: Sep 11, 1950 61 y.o. Primary Care Physician:Seymour Pavlak L, MD, MD Admit date: 07/18/2011 Discharge date: 07/24/2011    Discharge Diagnoses:   Principal Problem:  *Altered mental status Active Problems:  PTSD  DEPRESSION  HYPERTENSION  CVA  urinary incontinence Postconcussion syndrome  Medication List  As of 07/24/2011  7:55 AM   TAKE these medications         acetaminophen 500 MG tablet   Commonly known as: TYLENOL   Take 1,000 mg by mouth every 6 (six) hours as needed. For pain      aspirin EC 325 MG tablet   Take 325 mg by mouth daily.      CENTRUM SILVER ULTRA MENS PO   Take 1 tablet by mouth daily.      clonazePAM 1 MG tablet   Commonly known as: KLONOPIN   Take 1 mg by mouth 3 (three) times daily as needed. For anxiety      donepezil 5 MG tablet   Commonly known as: ARICEPT   Take 1 tablet (5 mg total) by mouth at bedtime as needed.      Fish Oil 1200 MG Caps   Take 1 capsule by mouth 2 (two) times daily.      FLUoxetine 20 MG capsule   Commonly known as: PROZAC   Take 20 mg by mouth daily.      rosuvastatin 20 MG tablet   Commonly known as: CRESTOR   Take 20 mg by mouth at bedtime.      sertraline 100 MG tablet   Commonly known as: ZOLOFT   Take 100 mg by mouth daily.      thiamine 100 MG tablet   Commonly known as: VITAMIN B-1   Take 100 mg by mouth daily.            Discharged Condition: Improved    Consults: Neurology  Significant Diagnostic Studies: Ct Head Wo Contrast  07/18/2011  *RADIOLOGY REPORT*  Clinical Data: Left-sided weakness.  Altered mental status.  CT HEAD WITHOUT CONTRAST  Technique:  Contiguous axial images were obtained from the base of the skull through the vertex without contrast.  Comparison: 05/07/2011.  Findings: The ventricles and subarachnoid spaces remain mildly prominent.  No intracranial hemorrhage, mass lesion or CT evidence  of acute infarction.  Unremarkable bones and included paranasal sinuses.  IMPRESSION:  1.  No acute abnormality. 2.  Stable mild atrophy.  Original Report Authenticated By: Darrol Angel, M.D.   Mr Laqueta Jean Wo Contrast  07/21/2011  *RADIOLOGY REPORT*  Clinical Data: Altered mental status.  Hypertension.  PTSD.  MRI HEAD WITHOUT AND WITH CONTRAST  Technique:  Multiplanar, multiecho pulse sequences of the brain and surrounding structures were obtained according to standard protocol without and with intravenous contrast  Contrast: 20mL MULTIHANCE GADOBENATE DIMEGLUMINE 529 MG/ML IV SOLN  Comparison: CT head 07/18/2011.  MRI brain 06/20/2008.  Findings: There is no evidence for acute infarction, intracranial hemorrhage, mass lesion, hydrocephalus, or extra-axial fluid.  Mild atrophy is present.  There is slight chronic microvascular ischemic change primarily the periventricular white matter.  Normal pituitary, pineal, and cerebellar tonsils. Small bilateral remote cerebellar infarcts.  Slight pannus surrounds the odontoid.  No foci of chronic hemorrhage.  No midline shift.  Major intracranial vascular structures patent.  Post contrast, no abnormal intracranial enhancement.  No acute sinus or mastoid disease. Similar appearance to prior MR and CT.  IMPRESSION: Mild atrophy and  chronic microvascular ischemic change.  No acute stroke.  Original Report Authenticated By: Elsie Stain, M.D.    Lab Results: Basic Metabolic Panel: No results found for this basename: NA:2,K:2,CL:2,CO2:2,GLUCOSE:2,BUN:2,CREATININE:2,CALCIUM:2,MG:2,PHOS:2 in the last 72 hours Liver Function Tests: No results found for this basename: AST:2,ALT:2,ALKPHOS:2,BILITOT:2,PROT:2,ALBUMIN:2 in the last 72 hours   CBC: No results found for this basename: WBC:2,NEUTROABS:2,HGB:2,HCT:2,MCV:2,PLT:2 in the last 72 hours  No results found for this or any previous visit (from the past 240 hour(s)).   Hospital Course: He was admitted with altered  mental status. He had 2 falls. He was amnesic for events of the day of admission. His CT did not show any definitive abnormalities. MRI also did not show any new neurological events. He continued to complain of cognitive issues, headache, incontinence and he had neurology consultation. He had an EEG that was normal. He returned approximately to baseline and was ready for discharge home  Discharge Exam: Blood pressure 127/78, pulse 46, temperature 97.8 F (36.6 C), temperature source Oral, resp. rate 20, height 6\' 1"  (1.854 m), weight 101 kg (222 lb 10.6 oz), SpO2 97.00%. He had some weakness in his left arm. His mental status was back to normal. He still had some left-sided weakness which is chronic.  Disposition: Home he will followup in my office and with the neurologist      Signed: Fredirick Maudlin Pager [phone]  07/24/2011, 7:55 AM

## 2011-09-05 ENCOUNTER — Ambulatory Visit (INDEPENDENT_AMBULATORY_CARE_PROVIDER_SITE_OTHER): Payer: No Typology Code available for payment source | Admitting: Urology

## 2011-09-05 DIAGNOSIS — N3942 Incontinence without sensory awareness: Secondary | ICD-10-CM

## 2011-10-17 ENCOUNTER — Ambulatory Visit (INDEPENDENT_AMBULATORY_CARE_PROVIDER_SITE_OTHER): Payer: Medicare Other | Admitting: Urology

## 2011-10-17 DIAGNOSIS — N3942 Incontinence without sensory awareness: Secondary | ICD-10-CM

## 2011-11-10 ENCOUNTER — Ambulatory Visit (HOSPITAL_COMMUNITY): Payer: Medicare Other | Admitting: Physical Therapy

## 2012-01-09 ENCOUNTER — Ambulatory Visit (INDEPENDENT_AMBULATORY_CARE_PROVIDER_SITE_OTHER): Payer: Medicare Other | Admitting: Urology

## 2012-01-09 DIAGNOSIS — N529 Male erectile dysfunction, unspecified: Secondary | ICD-10-CM

## 2012-01-09 DIAGNOSIS — N3942 Incontinence without sensory awareness: Secondary | ICD-10-CM

## 2012-01-09 DIAGNOSIS — R6882 Decreased libido: Secondary | ICD-10-CM

## 2012-03-08 DIAGNOSIS — Z87820 Personal history of traumatic brain injury: Secondary | ICD-10-CM | POA: Insufficient documentation

## 2012-03-26 ENCOUNTER — Ambulatory Visit (INDEPENDENT_AMBULATORY_CARE_PROVIDER_SITE_OTHER): Payer: Medicare Other | Admitting: Urology

## 2012-03-26 DIAGNOSIS — N3942 Incontinence without sensory awareness: Secondary | ICD-10-CM

## 2012-08-26 ENCOUNTER — Inpatient Hospital Stay (HOSPITAL_COMMUNITY)
Admission: EM | Admit: 2012-08-26 | Discharge: 2012-08-28 | DRG: 309 | Disposition: A | Discharge status: Home / Self Care | Payer: Medicare Other | Attending: Pulmonary Disease | Admitting: Pulmonary Disease

## 2012-08-26 ENCOUNTER — Encounter (HOSPITAL_COMMUNITY): Payer: Self-pay

## 2012-08-26 ENCOUNTER — Emergency Department (HOSPITAL_COMMUNITY): Payer: Medicare Other

## 2012-08-26 DIAGNOSIS — Z79899 Other long term (current) drug therapy: Secondary | ICD-10-CM

## 2012-08-26 DIAGNOSIS — I10 Essential (primary) hypertension: Secondary | ICD-10-CM | POA: Diagnosis present

## 2012-08-26 DIAGNOSIS — I959 Hypotension, unspecified: Secondary | ICD-10-CM

## 2012-08-26 DIAGNOSIS — M4712 Other spondylosis with myelopathy, cervical region: Secondary | ICD-10-CM | POA: Diagnosis present

## 2012-08-26 DIAGNOSIS — S4980XA Other specified injuries of shoulder and upper arm, unspecified arm, initial encounter: Secondary | ICD-10-CM | POA: Diagnosis present

## 2012-08-26 DIAGNOSIS — Z9181 History of falling: Secondary | ICD-10-CM

## 2012-08-26 DIAGNOSIS — W19XXXA Unspecified fall, initial encounter: Secondary | ICD-10-CM | POA: Diagnosis present

## 2012-08-26 DIAGNOSIS — S46909A Unspecified injury of unspecified muscle, fascia and tendon at shoulder and upper arm level, unspecified arm, initial encounter: Secondary | ICD-10-CM | POA: Diagnosis present

## 2012-08-26 DIAGNOSIS — F411 Generalized anxiety disorder: Secondary | ICD-10-CM | POA: Diagnosis present

## 2012-08-26 DIAGNOSIS — F4310 Post-traumatic stress disorder, unspecified: Secondary | ICD-10-CM | POA: Diagnosis present

## 2012-08-26 DIAGNOSIS — F3289 Other specified depressive episodes: Secondary | ICD-10-CM | POA: Diagnosis present

## 2012-08-26 DIAGNOSIS — I498 Other specified cardiac arrhythmias: Principal | ICD-10-CM | POA: Diagnosis present

## 2012-08-26 DIAGNOSIS — Z87891 Personal history of nicotine dependence: Secondary | ICD-10-CM

## 2012-08-26 DIAGNOSIS — I951 Orthostatic hypotension: Secondary | ICD-10-CM | POA: Diagnosis present

## 2012-08-26 DIAGNOSIS — R001 Bradycardia, unspecified: Secondary | ICD-10-CM

## 2012-08-26 DIAGNOSIS — G43909 Migraine, unspecified, not intractable, without status migrainosus: Secondary | ICD-10-CM | POA: Diagnosis present

## 2012-08-26 DIAGNOSIS — E785 Hyperlipidemia, unspecified: Secondary | ICD-10-CM | POA: Diagnosis present

## 2012-08-26 DIAGNOSIS — Z8249 Family history of ischemic heart disease and other diseases of the circulatory system: Secondary | ICD-10-CM

## 2012-08-26 DIAGNOSIS — Z8673 Personal history of transient ischemic attack (TIA), and cerebral infarction without residual deficits: Secondary | ICD-10-CM

## 2012-08-26 DIAGNOSIS — Z823 Family history of stroke: Secondary | ICD-10-CM

## 2012-08-26 DIAGNOSIS — F329 Major depressive disorder, single episode, unspecified: Secondary | ICD-10-CM

## 2012-08-26 LAB — CBC WITH DIFFERENTIAL/PLATELET
Basophils Absolute: 0 10*3/uL (ref 0.0–0.1)
Basophils Relative: 0 % (ref 0–1)
Eosinophils Absolute: 0.2 10*3/uL (ref 0.0–0.7)
Eosinophils Relative: 2 % (ref 0–5)
HCT: 37.9 % — ABNORMAL LOW (ref 39.0–52.0)
Hemoglobin: 13.1 g/dL (ref 13.0–17.0)
Lymphocytes Relative: 24 % (ref 12–46)
Lymphs Abs: 2.1 10*3/uL (ref 0.7–4.0)
MCH: 33.2 pg (ref 26.0–34.0)
MCHC: 34.6 g/dL (ref 30.0–36.0)
MCV: 95.9 fL (ref 78.0–100.0)
Monocytes Absolute: 0.9 10*3/uL (ref 0.1–1.0)
Monocytes Relative: 10 % (ref 3–12)
Neutro Abs: 5.6 10*3/uL (ref 1.7–7.7)
Neutrophils Relative %: 64 % (ref 43–77)
Platelets: 202 10*3/uL (ref 150–400)
RBC: 3.95 MIL/uL — ABNORMAL LOW (ref 4.22–5.81)
RDW: 12.8 % (ref 11.5–15.5)
WBC: 8.7 10*3/uL (ref 4.0–10.5)

## 2012-08-26 LAB — BASIC METABOLIC PANEL
BUN: 28 mg/dL — ABNORMAL HIGH (ref 6–23)
CO2: 29 mEq/L (ref 19–32)
Calcium: 9.8 mg/dL (ref 8.4–10.5)
Chloride: 103 mEq/L (ref 96–112)
Creatinine, Ser: 0.97 mg/dL (ref 0.50–1.35)
GFR calc Af Amer: >90 mL/min (ref >90)
GFR calc non Af Amer: 85 mL/min — ABNORMAL LOW (ref >90)
Glucose, Bld: 119 mg/dL — ABNORMAL HIGH (ref 70–99)
Potassium: 4.5 mEq/L (ref 3.5–5.1)
Sodium: 138 mEq/L (ref 135–145)

## 2012-08-26 LAB — MAGNESIUM: Magnesium: 2.1 mg/dL (ref 1.5–2.5)

## 2012-08-26 MED ORDER — POTASSIUM CHLORIDE IN NACL 20-0.9 MEQ/L-% IV SOLN
INTRAVENOUS | Status: DC
  Administered 2012-08-27: 1 mL via INTRAVENOUS
  Administered 2012-08-28 (×3): via INTRAVENOUS

## 2012-08-26 MED ORDER — ASPIRIN EC 325 MG PO TBEC
325.0000 mg | DELAYED_RELEASE_TABLET | Freq: Every day | ORAL | Status: DC
  Administered 2012-08-27 – 2012-08-28 (×2): 325 mg via ORAL
  Filled 2012-08-26 (×2): qty 1

## 2012-08-26 MED ORDER — SODIUM CHLORIDE 0.9 % IV BOLUS (SEPSIS)
2000.0000 mL | Freq: Once | INTRAVENOUS | Status: AC
  Administered 2012-08-26: 1000 mL via INTRAVENOUS

## 2012-08-26 MED ORDER — BISACODYL 5 MG PO TBEC: 5.0000 mg | DELAYED_RELEASE_TABLET | Freq: Every day | ORAL | Status: DC | PRN

## 2012-08-26 MED ORDER — TRAZODONE HCL 50 MG PO TABS
50.0000 mg | ORAL_TABLET | Freq: Every evening | ORAL | Status: DC | PRN
  Administered 2012-08-27 (×2): 50 mg via ORAL
  Filled 2012-08-26 (×2): qty 1

## 2012-08-26 MED ORDER — ACETAMINOPHEN 500 MG PO TABS
1000.0000 mg | ORAL_TABLET | Freq: Four times a day (QID) | ORAL | Status: DC | PRN
  Administered 2012-08-27 – 2012-08-28 (×4): 1000 mg via ORAL
  Filled 2012-08-26 (×5): qty 2

## 2012-08-26 MED ORDER — MEMANTINE HCL ER 28 MG PO CP24
1.0000 | ORAL_CAPSULE | Freq: Every day | ORAL | Status: DC
  Filled 2012-08-26: qty 28

## 2012-08-26 MED ORDER — OMEGA-3-ACID ETHYL ESTERS 1 G PO CAPS
1.0000 g | ORAL_CAPSULE | Freq: Two times a day (BID) | ORAL | Status: DC
  Administered 2012-08-27 – 2012-08-28 (×4): 1 g via ORAL
  Filled 2012-08-26 (×4): qty 1

## 2012-08-26 MED ORDER — IBUPROFEN 600 MG PO TABS: 600.0000 mg | ORAL_TABLET | Freq: Four times a day (QID) | ORAL | Status: DC | PRN

## 2012-08-26 MED ORDER — POLYETHYLENE GLYCOL 3350 17 G PO PACK: 17.0000 g | PACK | Freq: Every day | ORAL | Status: DC | PRN

## 2012-08-26 MED ORDER — SODIUM CHLORIDE 0.9 % IV SOLN
INTRAVENOUS | Status: AC
  Administered 2012-08-26: 1 mL via INTRAVENOUS

## 2012-08-26 MED ORDER — ATORVASTATIN CALCIUM 40 MG PO TABS
40.0000 mg | ORAL_TABLET | Freq: Every day | ORAL | Status: DC
  Administered 2012-08-27: 40 mg via ORAL
  Filled 2012-08-26: qty 1

## 2012-08-26 MED ORDER — SERTRALINE HCL 50 MG PO TABS
200.0000 mg | ORAL_TABLET | Freq: Every day | ORAL | Status: DC
  Administered 2012-08-27 – 2012-08-28 (×2): 200 mg via ORAL
  Filled 2012-08-26 (×2): qty 4

## 2012-08-26 MED ORDER — FLEET ENEMA 7-19 GM/118ML RE ENEM: 1.0000 | ENEMA | Freq: Once | RECTAL | Status: AC | PRN

## 2012-08-26 MED ORDER — ENOXAPARIN SODIUM 40 MG/0.4ML ~~LOC~~ SOLN
40.0000 mg | SUBCUTANEOUS | Status: DC
  Administered 2012-08-27 – 2012-08-28 (×2): 40 mg via SUBCUTANEOUS
  Filled 2012-08-26 (×2): qty 0.4

## 2012-08-26 NOTE — ED Notes (Signed)
Called to give report, nurse unavailable.  
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
## 2012-08-26 NOTE — ED Notes (Signed)
Pts HR dropped to 38 for a few seconds, pt states "wavy visual sensation" but denies dizziness. EDP made aware and stated, "OK" No new orders.

## 2012-08-26 NOTE — ED Notes (Signed)
Pt states he has been falling around. States he fell yesterday and twice last night. Complain of pain in right shoulder

## 2012-08-27 ENCOUNTER — Encounter (HOSPITAL_COMMUNITY): Payer: Self-pay | Admitting: Adult Health

## 2012-08-27 DIAGNOSIS — I959 Hypotension, unspecified: Secondary | ICD-10-CM

## 2012-08-27 DIAGNOSIS — I498 Other specified cardiac arrhythmias: Principal | ICD-10-CM

## 2012-08-27 LAB — COMPREHENSIVE METABOLIC PANEL
ALT: 23 U/L (ref 0–53)
AST: 17 U/L (ref 0–37)
Albumin: 3.2 g/dL — ABNORMAL LOW (ref 3.5–5.2)
Alkaline Phosphatase: 71 U/L (ref 39–117)
BUN: 16 mg/dL (ref 6–23)
CO2: 30 mEq/L (ref 19–32)
Calcium: 8.5 mg/dL (ref 8.4–10.5)
Chloride: 108 mEq/L (ref 96–112)
Creatinine, Ser: 0.74 mg/dL (ref 0.50–1.35)
GFR calc Af Amer: >90 mL/min (ref >90)
GFR calc non Af Amer: >90 mL/min (ref >90)
Glucose, Bld: 90 mg/dL (ref 70–99)
Potassium: 4.1 mEq/L (ref 3.5–5.1)
Sodium: 142 mEq/L (ref 135–145)
Total Bilirubin: 0.6 mg/dL (ref 0.3–1.2)
Total Protein: 5.6 g/dL — ABNORMAL LOW (ref 6.0–8.3)

## 2012-08-27 LAB — HEMOGLOBIN A1C
Hgb A1c MFr Bld: 5.6 % (ref <5.7)
Mean Plasma Glucose: 114 mg/dL (ref <117)

## 2012-08-27 LAB — CBC
HCT: 38.6 % — ABNORMAL LOW (ref 39.0–52.0)
Hemoglobin: 13.2 g/dL (ref 13.0–17.0)
MCH: 32.9 pg (ref 26.0–34.0)
MCHC: 34.2 g/dL (ref 30.0–36.0)
MCV: 96.3 fL (ref 78.0–100.0)
Platelets: 184 10*3/uL (ref 150–400)
RBC: 4.01 MIL/uL — ABNORMAL LOW (ref 4.22–5.81)
RDW: 12.7 % (ref 11.5–15.5)
WBC: 5.7 10*3/uL (ref 4.0–10.5)

## 2012-08-27 LAB — TSH: TSH: 1.984 u[IU]/mL (ref 0.350–4.500)

## 2012-08-27 LAB — CORTISOL: Cortisol, Plasma: 5.5 ug/dL

## 2012-08-27 MED ORDER — HYDROCODONE-ACETAMINOPHEN 5-325 MG PO TABS
1.0000 | ORAL_TABLET | Freq: Four times a day (QID) | ORAL | Status: DC | PRN
  Administered 2012-08-27 – 2012-08-28 (×5): 1 via ORAL
  Filled 2012-08-27 (×5): qty 1

## 2012-08-27 NOTE — Plan of Care (Signed)
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  Therefore, he knows how to operated that bed alarm and continues to turn off alarm to get out of bed by himself.  Pt has been educated several times by PCT and RN staff - but pt remains no-compliant.  Since pt is high fall risk (due to recent fall at home) staff has been informed and alarms remain active.

## 2012-08-27 NOTE — Plan of Care (Signed)
Problem: Consults Goal: General Medical Patient Education See Patient Education Module for specific education.  Explained to pt different consults asked for and why
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
## 2012-08-27 NOTE — Consult Note (Signed)
NAME:  [name], [name]           ACCOUNT NO.:  [id number]  MEDICAL RECORD NO.:  [id number]  LOCATION:  A321                          FACILITY:  [facility]  PHYSICIAN:  Jahdai Padovano A. Gerilyn Pilgrim, M.D. DATE OF BIRTH:  June 13, 1950  DATE OF CONSULTATION: DATE OF DISCHARGE:                                CONSULTATION   IMPRESSION: 1. Recurrent episodes of bradycardia, hypertension, and     syncope/presyncope of unclear etiology.  The patient has had these     spells before and medication effect seem to be an issue along with     dehydration.  The patient has had these spells before with     antispasmodic medication use to treat urine continence.  He has     recently been placed on Aricept probably closely a year ago and     responded very well to this medication.  Personally have not had a     syncopal/presyncopal episode with this medication in prescribing     for 20 years, but epocrates does list this as a potential side     effect along with bradycardia.  It is unclear where to go from here     with this medication given that he responded so well with the     medication.  We may consider lowering the dose to 5 mg. 2. Mild cognitive impairment, likely due to vascular events. 3. Chronic pain problems. 4. Chronic headaches.  HISTORY:  This is a 62 year old man who is well known to our service in an outpatient setting.  He has had some chronic neuropathic pain on the left side and chronic headaches.  The chronic left-sided pain is neuropathic related to his old stroke.  The patient has had multiple events of gait impairment and falling.  These has typically been related to various things, particularly medication usage to treat urinary incontinence and also related to dehydration.  The patient was started on Aricept several months ago for mild memory impairment/mild cognitive impairment.  He has responded well to this medication.  The patient has had some gait impairment, which we think is  multifactorial including probably effects from his old stroke.  He fell a few days ago, and did not lose consciousness.  He sustained injuries to the right shoulder and fell again and was sent to the emergency room where he was noted to be hypotensive and bradycardic.  These persisted despite vigorous hydration.  The patient therefore is admitted.  He is awake and alert.  He is lucid and coherent.  Speech, language, and cognition are intact.  Cranial evaluation for the pupils were equal, round, reactive to light and accommodation.  Extraocular movements are full.  Facial muscle strength symmetric.  Tongue is midline.  Uvula is midline.  Shoulder shrug, reduced on the right side.  Motor examination shows typical pronator drift on the left side, which is from his old stroke.  He also has mild hand grip weakness on the left. Otherwise, he has good strength throughout.  Reflexes are preserved. Sensation normal to light touch and temperature.     Avri Paiva A. Gerilyn Pilgrim, M.D.     KAD/MEDQ  D:  08/27/2012  T:  08/27/2012  Job:  [id number]
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
## 2012-08-27 NOTE — Progress Notes (Signed)
The patient's chart was reviewed, and I agree with the assessment and plan as documented above. The HR is now 65 bpm and BP 152/76 mmHg. Aricept has been known to cause bradycardia, and I agree with its discontinuation. Additionally, Memantine (particularly the long-acting form) is known to cause both hypotension and syncope. For this reason, I would consider discontinuing this as well.

## 2012-08-27 NOTE — Progress Notes (Signed)
Subjective: He came in with hypotension and bradycardia. His heart rate is been somewhat low in the past. His blood pressure has not been low like this. He has had multiple falls previously without hypotension. He says he is having more trouble swallowing. He has pain in his shoulder.  Objective: Vital signs in last 24 hours: Temp:  [97.8 F (36.6 C)-97.9 F (36.6 C)] 97.8 F (36.6 C) (07/11 0622) Pulse Rate:  [41-67] 50 (07/11 0622) Resp:  [16-24] 20 (07/11 0211) BP: (75-133)/(48-84) 122/72 mmHg (07/11 0630) SpO2:  [95 %-100 %] 96 % (07/11 0622) Weight:  [94.076 kg (207 lb 6.4 oz)-97.6 kg (215 lb 2.7 oz)] 96.2 kg (212 lb 1.3 oz) (07/11 0630) Weight change:  Last BM Date: 08/26/12  Intake/Output from previous day:    PHYSICAL EXAM General appearance: alert, cooperative and no distress Resp: clear to auscultation bilaterally Cardio: regular rate and rhythm, S1, S2 normal, no murmur, click, rub or gallop GI: soft, non-tender; bowel sounds normal; no masses,  no organomegaly Extremities: extremities normal, atraumatic, no cyanosis or edema  Lab Results:    Basic Metabolic Panel:  Recent Labs  62/95/28 1409 08/26/12 2314 08/27/12 0526  NA 138  --  142  K 4.5  --  4.1  CL 103  --  108  CO2 29  --  30  GLUCOSE 119*  --  90  BUN 28*  --  16  CREATININE 0.97  --  0.74  CALCIUM 9.8  --  8.5  MG  --  2.1  --    Liver Function Tests:  Recent Labs  08/27/12 0526  AST 17  ALT 23  ALKPHOS 71  BILITOT 0.6  PROT 5.6*  ALBUMIN 3.2*   No results found for this basename: LIPASE, AMYLASE,  in the last 72 hours No results found for this basename: AMMONIA,  in the last 72 hours CBC:  Recent Labs  08/26/12 1409 08/27/12 0526  WBC 8.7 5.7  NEUTROABS 5.6  --   HGB 13.1 13.2  HCT 37.9* 38.6*  MCV 95.9 96.3  PLT 202 184   Cardiac Enzymes: No results found for this basename: CKTOTAL, CKMB, CKMBINDEX, TROPONINI,  in the last 72 hours BNP: No results found for this  basename: PROBNP,  in the last 72 hours D-Dimer: No results found for this basename: DDIMER,  in the last 72 hours CBG: No results found for this basename: GLUCAP,  in the last 72 hours Hemoglobin A1C: No results found for this basename: HGBA1C,  in the last 72 hours Fasting Lipid Panel: No results found for this basename: CHOL, HDL, LDLCALC, TRIG, CHOLHDL, LDLDIRECT,  in the last 72 hours Thyroid Function Tests: No results found for this basename: TSH, T4TOTAL, FREET4, T3FREE, THYROIDAB,  in the last 72 hours Anemia Panel: No results found for this basename: VITAMINB12, FOLATE, FERRITIN, TIBC, IRON, RETICCTPCT,  in the last 72 hours Coagulation: No results found for this basename: LABPROT, INR,  in the last 72 hours Urine Drug Screen: Drugs of Abuse  No results found for this basename: labopia, cocainscrnur, labbenz, amphetmu, thcu, labbarb    Alcohol Level: No results found for this basename: ETH,  in the last 72 hours Urinalysis: No results found for this basename: COLORURINE, APPERANCEUR, LABSPEC, PHURINE, GLUCOSEU, HGBUR, BILIRUBINUR, KETONESUR, PROTEINUR, UROBILINOGEN, NITRITE, LEUKOCYTESUR,  in the last 72 hours Misc. Labs:  ABGS No results found for this basename: PHART, PCO2, PO2ART, TCO2, HCO3,  in the last 72 hours CULTURES No results  found for this or any previous visit (from the past 240 hour(s)). Studies/Results: Dg Shoulder Right  08/26/2012   *RADIOLOGY REPORT*  Clinical Data: Right shoulder pain after fall.  RIGHT SHOULDER - 2+ VIEW  Comparison: None.  Findings: Negative for acute fracture, glenohumeral dislocation, or definite AC joint separation ( oblique frontal limits measurement of the joint distance, but the narrowest point is likely less than 6mm).  AC joint degenerative changes with inferior spurring.  IMPRESSION:  1.  Negative for fracture or glenohumeral dislocation. 2.  Prominent AC joint distance, without definite separation.   Original Report Authenticated  By: Tiburcio Pea    Medications:  Prior to Admission:  Prescriptions prior to admission  Medication Sig Dispense Refill  . acetaminophen (TYLENOL) 500 MG tablet Take 1,000 mg by mouth every 6 (six) hours as needed. For pain      . aspirin EC 325 MG tablet Take 325 mg by mouth daily.      Marland Kitchen aspirin-acetaminophen-caffeine (EXCEDRIN MIGRAINE) 250-250-65 MG per tablet Take 2 tablets by mouth every 6 (six) hours as needed for pain.      . calcium citrate-vitamin D (CITRACAL+D) 315-200 MG-UNIT per tablet Take 2 tablets by mouth daily.      Marland Kitchen donepezil (ARICEPT) 10 MG tablet Take 10 mg by mouth daily.      . Memantine HCl ER (NAMENDA XR) 28 MG CP24 Take 1 capsule by mouth daily.      . Multiple Vitamins-Minerals (CENTRUM SILVER ULTRA MENS PO) Take 1 tablet by mouth daily.      . Omega-3 Fatty Acids (FISH OIL) 1200 MG CAPS Take 1 capsule by mouth 2 (two) times daily.      . rosuvastatin (CRESTOR) 20 MG tablet Take 20 mg by mouth at bedtime.      . sertraline (ZOLOFT) 100 MG tablet Take 200 mg by mouth daily.       Marland Kitchen thiamine (VITAMIN B-1) 100 MG tablet Take 100 mg by mouth daily.      . traMADol (ULTRAM) 50 MG tablet Take 50 mg by mouth every 6 (six) hours as needed for pain.       Scheduled: . sodium chloride   Intravenous STAT  . aspirin EC  325 mg Oral Daily  . atorvastatin  40 mg Oral q1800  . enoxaparin (LOVENOX) injection  40 mg Subcutaneous Q24H  . Memantine HCl ER  1 capsule Oral Daily  . omega-3 acid ethyl esters  1 g Oral BID  . sertraline  200 mg Oral Daily   Continuous: . 0.9 % NaCl with KCl 20 mEq / L 1 mL (08/27/12 0006)   WUJ:[id number], bisacodyl, HYDROcodone-acetaminophen, ibuprofen, polyethylene glycol, traZODone  Assesment: He has come in after having had several falls. His blood pressure and pulse are low. I believe that Aricept has been associated with bradycardia but he has been on this for a long period of time. He was started on Aricept because of cognitive  abnormalities associated with his previous stroke and head trauma. He complains of swallowing problems and says that he had trouble previously and did not pass any previous swallowing evaluation. He has a history of hypertension. He has postherpetic stress disorder. He has had a previous stroke. He has had episodes of transient global amnesia. He has shoulder pain after the fall. Active Problems:   DYSLIPIDEMIA   PTSD   HYPERTENSION   Cervical spondylosis with myelopathy   Hypotension   Bradycardia    Plan: I will asked  for cardiology consult. I will ask for neurology to see him as well. He'll have orthopedic consultation for his shoulder. He needs another swallowing evaluation. He's going to have cortisol levels done. He will continue IV fluids. He will have orthostatics measured every shift    LOS: 1 day   Zacariah Belue L 08/27/2012, 8:53 AM
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
## 2012-08-27 NOTE — Progress Notes (Signed)
Utilization Review Complete  

## 2012-08-28 LAB — CORTISOL-AM, BLOOD: Cortisol - AM: 12.8 ug/dL (ref 4.3–22.4)

## 2012-08-28 LAB — CORTISOL-PM, BLOOD: Cortisol - PM: 3.5 ug/dL (ref 3.1–16.7)

## 2012-08-28 LAB — LIPID PANEL
Cholesterol: 125 mg/dL (ref 0–200)
HDL: 46 mg/dL (ref >39)
LDL Cholesterol: 44 mg/dL (ref 0–99)
Total CHOL/HDL Ratio: 2.7 RATIO
Triglycerides: 175 mg/dL — ABNORMAL HIGH (ref <150)
VLDL: 35 mg/dL (ref 0–40)

## 2012-08-28 NOTE — Plan of Care (Signed)
Pt is extremely angry and not happy with the care he's getting from the Drs.  He said only a NP from Cardio has come to see him and Ortho has yet to come.  I called to check on status of Ortho and they have no coverage for this weekend. Pt said he will get the "right" care" or he's leaving.  Dr. Juanetta Gosling was on floor and informed.

## 2012-08-28 NOTE — Plan of Care (Signed)
Problem: Consults Goal: Nutrition Consult-if indicated Talked with pt about his current diet and if it's classified as Cardiac health.

## 2012-08-28 NOTE — Discharge Summary (Signed)
Pt stated he was leaving and Dr. Juanetta Gosling agreed to write the orders.  Pt's said his pain was under control.  His IV and tele were removed prior to DC and he's currently waiting on his ride to arrive.  Pt was also given info on CVA's at discharge and asked to verbalize known s/sx.  Pt will be wheeled to car by PCT and family

## 2012-08-28 NOTE — Progress Notes (Signed)
NAME:  [name], [name] NO.:  [id number]  MEDICAL RECORD NO.:  [id number]  LOCATION:  A321                          FACILITY:  [facility]  PHYSICIAN:  Williamson Cavanah L. Juanetta Gosling, M.D.DATE OF BIRTH:  June 16, 1950  DATE OF PROCEDURE: DATE OF DISCHARGE:                                PROGRESS NOTE   [name] was admitted with syncope, bradycardia, and hypotension. He had significant orthostasis, which has improved.  His blood pressure has returned to baseline in the 130s.  His heart rate is in the 60s.  He says his shoulder pain is controlled.  We discussed the fact that it is felt that his problem may be related to his medications for his cognitive impairment related to his previous strokes and he understands that.  Initially, I had planned on him staying another 24 hours for monitoring of blood pressure, etc., but he became very upset and agitated because he was not seen by Orthopedics and now there is no orthopedic coverage for the weekend, and he was not seen by the Cardiology attending.  He said that he did not see any reason to stay in the hospital, since he was admitted to the hospital basically for Orthopedic and Cardiology consultation.  I told him that his situation has been reviewed by the attending cardiologist and note was written, but he is quite frustrated and wants to be discharged.  He does have his blood pressure back to baseline and his heart rate is in the 60s, and he says his pain in his shoulder is controlled, so I think it is although not ideal safe for him to be discharged.  I will plan to discharge him home.     [name] L. Juanetta Gosling, M.D.     ELH/MEDQ  D:  08/28/2012  T:  08/28/2012  Job:  [id number]

## 2012-08-31 NOTE — Discharge Summary (Signed)
Physician Discharge Summary  Patient ID: [name] MRN: [id number] DOB/AGE: 1950/06/05 62 y.o. Primary Care Physician:Bryttany Tortorelli L, MD Admit date: 08/26/2012 Discharge date: 08/31/2012    Discharge Diagnoses:  Syncope Active Problems:   DYSLIPIDEMIA   PTSD   HYPERTENSION   Cervical spondylosis with myelopathy   Hypotension   Bradycardia     Medication List    STOP taking these medications       donepezil 10 MG tablet  Commonly known as:  ARICEPT     NAMENDA XR 28 MG Cp24  Generic drug:  Memantine HCl ER      TAKE these medications       acetaminophen 500 MG tablet  Commonly known as:  TYLENOL  Take 1,000 mg by mouth every 6 (six) hours as needed. For pain     aspirin EC 325 MG tablet  Take 325 mg by mouth daily.     aspirin-acetaminophen-caffeine 250-250-65 MG per tablet  Commonly known as:  EXCEDRIN MIGRAINE  Take 2 tablets by mouth every 6 (six) hours as needed for pain.     calcium citrate-vitamin D 315-200 MG-UNIT per tablet  Commonly known as:  CITRACAL+D  Take 2 tablets by mouth daily.     CENTRUM SILVER ULTRA MENS PO  Take 1 tablet by mouth daily.     Fish Oil 1200 MG Caps  Take 1 capsule by mouth 2 (two) times daily.     rosuvastatin 20 MG tablet  Commonly known as:  CRESTOR  Take 20 mg by mouth at bedtime.     sertraline 100 MG tablet  Commonly known as:  ZOLOFT  Take 200 mg by mouth daily.     thiamine 100 MG tablet  Commonly known as:  VITAMIN B-1  Take 100 mg by mouth daily.     traMADol 50 MG tablet  Commonly known as:  ULTRAM  Take 50 mg by mouth every 6 (six) hours as needed for pain.        Discharged Condition: Improved    Consults: Neurology and cardiology  Significant Diagnostic Studies: Dg Shoulder Right  08/26/2012   *RADIOLOGY REPORT*  Clinical Data: Right shoulder pain after fall.  RIGHT SHOULDER - 2+ VIEW  Comparison: None.  Findings: Negative for acute fracture, glenohumeral dislocation, or definite  AC joint separation ( oblique frontal limits measurement of the joint distance, but the narrowest point is likely less than 6mm).  AC joint degenerative changes with inferior spurring.  IMPRESSION:  1.  Negative for fracture or glenohumeral dislocation. 2.  Prominent AC joint distance, without definite separation.   Original Report Authenticated By: Tiburcio Pea    Lab Results: Basic Metabolic Panel: No results found for this basename: NA, K, CL, CO2, GLUCOSE, BUN, CREATININE, CALCIUM, MG, PHOS,  in the last 72 hours Liver Function Tests: No results found for this basename: AST, ALT, ALKPHOS, BILITOT, PROT, ALBUMIN,  in the last 72 hours   CBC: No results found for this basename: WBC, NEUTROABS, HGB, HCT, MCV, PLT,  in the last 72 hours  No results found for this or any previous visit (from the past 240 hour(s)).   Hospital Course: This is a 62 year old who has had multiple episodes of syncope in the past. He had 3 episodes on the day of admission and came to the emergency department. There is was found to be bradycardic and hypotensive. He was given IV fluids but did not recover. He was admitted for IV fluids. He also was found  to have a probable shoulder separation and was complaining of pain in his shoulder. He was given IV fluids his Aricept was held he had cardiology consult and it was felt that Aricept and Namenda could potentially cause his problem so these were discontinued. His heart rate was in the 60s blood pressure back up into the 120s. Orthopedic consultation was requested orthopedics was not available here at the time that he was in the hospital. He also had cardiology consultation with cardiology was not available over the weekend. He became very upset and felt that he should go home since he would not have orthopedic cardiology evaluations available. Since his blood pressure was back up and his heart rate was better and he said his pain was under control I would go  home.  Discharge Exam: Blood pressure 138/81, pulse 60, temperature 98.1 F (36.7 C), temperature source Oral, resp. rate 22, height 6\' 1"  (1.854 m), weight 96.1 kg (211 lb 13.8 oz), SpO2 97.00%. He's awake and alert. His chest is clear. His heart is regular.  Disposition: Home      Signed: Kendell Sagraves Elbert Ewings Pager [phone]  08/31/2012, 8:04 AM

## 2012-09-06 ENCOUNTER — Other Ambulatory Visit (HOSPITAL_COMMUNITY): Payer: Self-pay | Admitting: Pulmonary Disease

## 2012-09-06 DIAGNOSIS — R1310 Dysphagia, unspecified: Secondary | ICD-10-CM

## 2012-09-10 ENCOUNTER — Other Ambulatory Visit (HOSPITAL_COMMUNITY): Payer: Medicare Other

## 2012-09-13 ENCOUNTER — Other Ambulatory Visit (HOSPITAL_COMMUNITY): Payer: Self-pay | Admitting: Pulmonary Disease

## 2012-09-13 ENCOUNTER — Ambulatory Visit (HOSPITAL_COMMUNITY)
Admission: RE | Admit: 2012-09-13 | Discharge: 2012-09-13 | Disposition: A | Discharge status: Home / Self Care | Payer: Medicare Other | Source: Ambulatory Visit | Attending: Pulmonary Disease | Admitting: Pulmonary Disease

## 2012-09-13 DIAGNOSIS — R1310 Dysphagia, unspecified: Secondary | ICD-10-CM

## 2012-09-13 DIAGNOSIS — R1313 Dysphagia, pharyngeal phase: Secondary | ICD-10-CM | POA: Insufficient documentation

## 2012-09-13 DIAGNOSIS — IMO0001 Reserved for inherently not codable concepts without codable children: Secondary | ICD-10-CM | POA: Insufficient documentation
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
## 2012-09-20 ENCOUNTER — Encounter: Payer: Self-pay | Admitting: *Deleted

## 2012-09-20 DIAGNOSIS — F411 Generalized anxiety disorder: Secondary | ICD-10-CM

## 2012-09-20 DIAGNOSIS — J984 Other disorders of lung: Secondary | ICD-10-CM | POA: Insufficient documentation

## 2012-09-20 DIAGNOSIS — I6789 Other cerebrovascular disease: Secondary | ICD-10-CM | POA: Insufficient documentation

## 2012-09-20 DIAGNOSIS — G459 Transient cerebral ischemic attack, unspecified: Secondary | ICD-10-CM | POA: Insufficient documentation

## 2012-09-21 ENCOUNTER — Ambulatory Visit (INDEPENDENT_AMBULATORY_CARE_PROVIDER_SITE_OTHER): Payer: Medicare Other | Admitting: Cardiovascular Disease

## 2012-09-21 ENCOUNTER — Encounter: Payer: Self-pay | Admitting: Cardiovascular Disease

## 2012-09-21 VITALS — BP 141/82 | HR 67 | Ht 73.0 in | Wt 208.2 lb

## 2012-09-21 DIAGNOSIS — Q211 Atrial septal defect: Secondary | ICD-10-CM

## 2012-09-21 NOTE — Assessment & Plan Note (Signed)
Continue ASA  F/U neuro

## 2012-09-21 NOTE — Progress Notes (Signed)
Patient ID: [name], male   DOB: 10-31-1950, 62 y.o.   MRN: [id number] 62 yo last seen by me in 2010 History of TIAls and unusual neuro signs with CVA.  Sent to [facility] for occluder device for PFO.  Had normal cath in 2006  Recently admitted to [facility] with bradycardia and hypotension.  Thought due to aricept and namenda.  No history of intrinsic conduction disease. Feels much better off statins and aricept with no bradycardia and improved memory.  No recurrent episodes of dizzyness.  Home HR and BP reading ok with HR 65-80 at rest.  No chest pain dyspnea or syncope.    ROS: Denies fever, malais, weight loss, blurry vision, decreased visual acuity, cough, sputum, SOB, hemoptysis, pleuritic pain, palpitaitons, heartburn, abdominal pain, melena, lower extremity edema, claudication, or rash.  All other systems reviewed and negative  General: Affect appropriate Healthy:  appears stated age HEENT: normal Neck supple with no adenopathy JVP normal no bruits no thyromegaly Lungs clear with no wheezing and good diaphragmatic motion Heart:  S1/S2 no murmur, no rub, gallop or click PMI normal Abdomen: benighn, BS positve, no tenderness, no AAA no bruit.  No HSM or HJR Distal pulses intact with no bruits No edema Neuro non-focal Skin warm and dry No muscular weakness   Current Outpatient Prescriptions  Medication Sig Dispense Refill  . acetaminophen (TYLENOL) 500 MG tablet Take 1,000 mg by mouth every 6 (six) hours as needed. For pain      . aspirin EC 325 MG tablet Take 325 mg by mouth daily.      Marland Kitchen aspirin-acetaminophen-caffeine (EXCEDRIN MIGRAINE) 250-250-65 MG per tablet Take 2 tablets by mouth every 6 (six) hours as needed for pain.      . calcium citrate-vitamin D (CITRACAL+D) 315-200 MG-UNIT per tablet Take 2 tablets by mouth daily.      . Multiple Vitamins-Minerals (CENTRUM SILVER ULTRA MENS PO) Take 1 tablet by mouth daily.      . Omega-3 Fatty Acids (FISH OIL) 1200 MG CAPS Take 1  capsule by mouth 2 (two) times daily.      . sertraline (ZOLOFT) 100 MG tablet Take 150 mg by mouth daily.       Marland Kitchen thiamine (VITAMIN B-1) 100 MG tablet Take 100 mg by mouth daily.       No current facility-administered medications for this visit.    Allergies  Aricept; Bee venom; Namenda; Quinine derivatives; Divalproex sodium; and Statins  Electrocardiogram:  08/27/12  SB rate 46 PAC no conduction disease  Assessment and Plan

## 2012-09-21 NOTE — Patient Instructions (Addendum)
Your physician recommends that you schedule a follow-up appointment in:6 MONTHS  Your physician has requested that you have an echocardiogram. Echocardiography is a painless test that uses sound waves to create images of your heart. It provides your doctor with information about the size and shape of your heart and how well your heart's chambers and valves are working. This procedure takes approximately one hour. There are no restrictions for this procedure.

## 2012-09-21 NOTE — Assessment & Plan Note (Signed)
S/P occluder device  F/U echo to ensure no patency

## 2012-09-21 NOTE — Assessment & Plan Note (Signed)
Resolved off aricept.  Normla ECG and pulse now  He will monitor pulse with exercise to make sure there is no chronotropic incompetence in leu of ETT

## 2012-09-28 ENCOUNTER — Ambulatory Visit (HOSPITAL_COMMUNITY)
Admission: RE | Admit: 2012-09-28 | Discharge: 2012-09-28 | Disposition: A | Discharge status: Home / Self Care | Payer: Medicare Other | Source: Ambulatory Visit | Attending: Cardiovascular Disease | Admitting: Cardiovascular Disease

## 2012-09-28 DIAGNOSIS — Q211 Atrial septal defect: Secondary | ICD-10-CM | POA: Insufficient documentation

## 2012-09-28 DIAGNOSIS — Q2111 Secundum atrial septal defect: Secondary | ICD-10-CM | POA: Insufficient documentation

## 2012-09-28 DIAGNOSIS — I517 Cardiomegaly: Secondary | ICD-10-CM

## 2012-09-28 DIAGNOSIS — I10 Essential (primary) hypertension: Secondary | ICD-10-CM | POA: Insufficient documentation

## 2012-09-28 DIAGNOSIS — E785 Hyperlipidemia, unspecified: Secondary | ICD-10-CM | POA: Insufficient documentation

## 2012-09-28 DIAGNOSIS — R55 Syncope and collapse: Secondary | ICD-10-CM | POA: Insufficient documentation

## 2012-09-28 NOTE — Progress Notes (Signed)
*  PRELIMINARY RESULTS* Echocardiogram 2D Echocardiogram has been performed.  [name] 09/28/2012, 2:29 PM

## 2012-10-01 ENCOUNTER — Telehealth: Payer: Self-pay | Admitting: Cardiovascular Disease

## 2012-10-01 NOTE — Telephone Encounter (Signed)
Follow up  Pt returing cal

## 2012-10-01 NOTE — Telephone Encounter (Signed)
Follow up  Pt is returning your call regarding his test results.

## 2012-10-01 NOTE — Telephone Encounter (Signed)
PT AWARE OF ECHO RESULTS./CY 

## 2012-12-28 ENCOUNTER — Other Ambulatory Visit: Payer: Self-pay | Admitting: Urology

## 2013-01-14 ENCOUNTER — Encounter (HOSPITAL_COMMUNITY): Payer: Self-pay | Admitting: Pharmacy Technician

## 2013-01-18 ENCOUNTER — Encounter (HOSPITAL_COMMUNITY): Payer: Self-pay

## 2013-01-18 ENCOUNTER — Encounter (INDEPENDENT_AMBULATORY_CARE_PROVIDER_SITE_OTHER): Payer: Self-pay

## 2013-01-18 ENCOUNTER — Ambulatory Visit (HOSPITAL_COMMUNITY)
Admission: RE | Admit: 2013-01-18 | Discharge: 2013-01-18 | Disposition: A | Discharge status: Home / Self Care | Payer: Medicare Other | Source: Ambulatory Visit | Attending: Urology | Admitting: Urology

## 2013-01-18 ENCOUNTER — Encounter (HOSPITAL_COMMUNITY)
Admission: RE | Admit: 2013-01-18 | Discharge: 2013-01-18 | Disposition: A | Discharge status: Home / Self Care | Payer: Medicare Other | Source: Ambulatory Visit | Attending: Urology | Admitting: Urology

## 2013-01-18 DIAGNOSIS — Z01812 Encounter for preprocedural laboratory examination: Secondary | ICD-10-CM | POA: Insufficient documentation

## 2013-01-18 DIAGNOSIS — Z01818 Encounter for other preprocedural examination: Secondary | ICD-10-CM | POA: Insufficient documentation

## 2013-01-18 DIAGNOSIS — N529 Male erectile dysfunction, unspecified: Secondary | ICD-10-CM | POA: Insufficient documentation

## 2013-01-18 HISTORY — DX: Postconcussional syndrome: F07.81

## 2013-01-18 HISTORY — DX: Other abnormalities of gait and mobility: R26.89

## 2013-01-18 HISTORY — DX: Dizziness and giddiness: R42

## 2013-01-18 HISTORY — DX: Gastrointestinal hemorrhage, unspecified: K92.2

## 2013-01-18 HISTORY — DX: Anesthesia of skin: R20.2

## 2013-01-18 HISTORY — DX: Other amnesia: R41.3

## 2013-01-18 HISTORY — DX: Unspecified osteoarthritis, unspecified site: M19.90

## 2013-01-18 HISTORY — DX: Shortness of breath: R06.02

## 2013-01-18 HISTORY — DX: Nontoxic single thyroid nodule: E04.1

## 2013-01-18 HISTORY — DX: Depression, unspecified: F32.A

## 2013-01-18 HISTORY — DX: Anesthesia of skin: R20.0

## 2013-01-18 HISTORY — DX: Unspecified urinary incontinence: R32

## 2013-01-18 HISTORY — DX: Esophageal obstruction: K22.2

## 2013-01-18 HISTORY — DX: Personal history of other diseases of the circulatory system: Z86.79

## 2013-01-18 HISTORY — DX: Post-traumatic stress disorder, unspecified: F43.10

## 2013-01-18 HISTORY — DX: Atrial septal defect: Q21.1

## 2013-01-18 HISTORY — DX: Pneumonia, unspecified organism: J18.9

## 2013-01-18 HISTORY — DX: Sleep apnea, unspecified: G47.30

## 2013-01-18 HISTORY — DX: Major depressive disorder, single episode, unspecified: F32.9

## 2013-01-18 LAB — CBC
HCT: 46.3 % (ref 39.0–52.0)
Hemoglobin: 15.5 g/dL (ref 13.0–17.0)
MCH: 32 pg (ref 26.0–34.0)
MCHC: 33.5 g/dL (ref 30.0–36.0)
MCV: 95.7 fL (ref 78.0–100.0)
Platelets: 214 10*3/uL (ref 150–400)
RBC: 4.84 MIL/uL (ref 4.22–5.81)
RDW: 12.5 % (ref 11.5–15.5)
WBC: 6.2 10*3/uL (ref 4.0–10.5)

## 2013-01-18 LAB — BASIC METABOLIC PANEL
BUN: 27 mg/dL — ABNORMAL HIGH (ref 6–23)
CO2: 28 mEq/L (ref 19–32)
Calcium: 10.2 mg/dL (ref 8.4–10.5)
Chloride: 103 mEq/L (ref 96–112)
Creatinine, Ser: 0.81 mg/dL (ref 0.50–1.35)
GFR calc Af Amer: >90 mL/min (ref >90)
GFR calc non Af Amer: >90 mL/min (ref >90)
Glucose, Bld: 98 mg/dL (ref 70–99)
Potassium: 5.2 mEq/L — ABNORMAL HIGH (ref 3.5–5.1)
Sodium: 140 mEq/L (ref 135–145)

## 2013-01-18 NOTE — Patient Instructions (Signed)
20 FREDDRICK GLADSON  01/18/2013   Your procedure is scheduled on: 01/24/13  Report to [facility] at 6:30 AM.  Call this number if you have problems the morning of surgery [phone]: [phone]   Remember:   Do not eat food or drink liquids After Midnight.     Take these medicines the morning of surgery with A SIP OF WATER: sertraline   Do not wear jewelry, make-up or nail polish.  Do not wear lotions, powders, or perfumes. You may wear deodorant.  Do not shave 48 hours prior to surgery. Men may shave face and neck.  Do not bring valuables to the hospital.  Contacts, dentures or bridgework may not be worn into surgery.  Leave suitcase in the car. After surgery it may be brought to your room.  For patients admitted to the hospital, checkout time is 11:00 AM the day of discharge.    Birdie Sons, RN  pre op nurse call if needed [phone]    FAILURE TO FOLLOW THESE INSTRUCTIONS MAY RESULT IN CANCELLATION OF YOUR SURGERY   Patient Signature: ___________________________________________

## 2013-01-18 NOTE — Progress Notes (Addendum)
EKG 08/27/12 on EPIC, LOV note 06/30/12 Dr. Gerilyn Pilgrim on chart

## 2013-01-23 MED ORDER — GENTAMICIN SULFATE 40 MG/ML IJ SOLN
440.0000 mg | INTRAVENOUS | Status: AC
  Administered 2013-01-24: 440 mg via INTRAVENOUS
  Filled 2013-01-23: qty 11

## 2013-01-24 ENCOUNTER — Encounter (HOSPITAL_COMMUNITY): Payer: Self-pay | Admitting: *Deleted

## 2013-01-24 ENCOUNTER — Ambulatory Visit (HOSPITAL_COMMUNITY): Payer: Medicare Other | Admitting: Anesthesiology

## 2013-01-24 ENCOUNTER — Encounter (HOSPITAL_COMMUNITY): Payer: Medicare Other | Admitting: Anesthesiology

## 2013-01-24 ENCOUNTER — Ambulatory Visit (HOSPITAL_COMMUNITY)
Admission: RE | Admit: 2013-01-24 | Discharge: 2013-01-25 | Disposition: A | Discharge status: Home / Self Care | Payer: Medicare Other | Source: Ambulatory Visit | Attending: Urology | Admitting: Urology

## 2013-01-24 ENCOUNTER — Encounter (HOSPITAL_COMMUNITY)
Admission: RE | Disposition: A | Discharge status: Home / Self Care | Payer: Self-pay | Source: Ambulatory Visit | Attending: Urology

## 2013-01-24 DIAGNOSIS — L723 Sebaceous cyst: Secondary | ICD-10-CM | POA: Insufficient documentation

## 2013-01-24 DIAGNOSIS — E291 Testicular hypofunction: Secondary | ICD-10-CM | POA: Diagnosis not present

## 2013-01-24 DIAGNOSIS — Z9852 Vasectomy status: Secondary | ICD-10-CM | POA: Insufficient documentation

## 2013-01-24 DIAGNOSIS — Z79899 Other long term (current) drug therapy: Secondary | ICD-10-CM | POA: Insufficient documentation

## 2013-01-24 DIAGNOSIS — N3942 Incontinence without sensory awareness: Secondary | ICD-10-CM | POA: Insufficient documentation

## 2013-01-24 DIAGNOSIS — Z7982 Long term (current) use of aspirin: Secondary | ICD-10-CM | POA: Insufficient documentation

## 2013-01-24 DIAGNOSIS — N529 Male erectile dysfunction, unspecified: Secondary | ICD-10-CM | POA: Diagnosis not present

## 2013-01-24 DIAGNOSIS — G4730 Sleep apnea, unspecified: Secondary | ICD-10-CM | POA: Insufficient documentation

## 2013-01-24 HISTORY — PX: PENILE PROSTHESIS IMPLANT: SHX240

## 2013-01-24 SURGERY — INSERTION, PENILE PROSTHESIS, INFLATABLE
Anesthesia: General

## 2013-01-24 MED ORDER — CEFAZOLIN SODIUM-DEXTROSE 2-3 GM-% IV SOLR
2.0000 g | INTRAVENOUS | Status: AC
  Administered 2013-01-24: 2 g via INTRAVENOUS

## 2013-01-24 MED ORDER — LIDOCAINE HCL (CARDIAC) 20 MG/ML IV SOLN
INTRAVENOUS | Status: AC
  Filled 2013-01-24: qty 5

## 2013-01-24 MED ORDER — ZOLPIDEM TARTRATE 5 MG PO TABS
5.0000 mg | ORAL_TABLET | Freq: Every evening | ORAL | Status: DC | PRN
  Administered 2013-01-24: 5 mg via ORAL
  Filled 2013-01-24: qty 1

## 2013-01-24 MED ORDER — NEOSTIGMINE METHYLSULFATE 1 MG/ML IJ SOLN
INTRAMUSCULAR | Status: DC | PRN
  Administered 2013-01-24: 4 mg via INTRAVENOUS

## 2013-01-24 MED ORDER — OXYCODONE-ACETAMINOPHEN 5-325 MG PO TABS
1.0000 | ORAL_TABLET | ORAL | Status: DC | PRN
  Administered 2013-01-25 (×2): 2 via ORAL
  Filled 2013-01-24 (×2): qty 2

## 2013-01-24 MED ORDER — GLYCOPYRROLATE 0.2 MG/ML IJ SOLN
INTRAMUSCULAR | Status: AC
  Filled 2013-01-24: qty 3

## 2013-01-24 MED ORDER — SERTRALINE HCL 50 MG PO TABS
175.0000 mg | ORAL_TABLET | Freq: Every day | ORAL | Status: DC
  Administered 2013-01-25: 08:00:00 175 mg via ORAL
  Filled 2013-01-24 (×2): qty 1

## 2013-01-24 MED ORDER — STERILE WATER FOR IRRIGATION IR SOLN
Status: DC | PRN
  Administered 2013-01-24: 500 mL

## 2013-01-24 MED ORDER — BACITRACIN-NEOMYCIN-POLYMYXIN 400-5-5000 EX OINT
TOPICAL_OINTMENT | CUTANEOUS | Status: AC
  Filled 2013-01-24: qty 1

## 2013-01-24 MED ORDER — PROPOFOL 10 MG/ML IV BOLUS
INTRAVENOUS | Status: DC | PRN
  Administered 2013-01-24: 200 mg via INTRAVENOUS

## 2013-01-24 MED ORDER — MIDAZOLAM HCL 2 MG/2ML IJ SOLN
INTRAMUSCULAR | Status: AC
  Filled 2013-01-24: qty 2

## 2013-01-24 MED ORDER — KETOROLAC TROMETHAMINE 30 MG/ML IJ SOLN
INTRAMUSCULAR | Status: AC
  Filled 2013-01-24: qty 1

## 2013-01-24 MED ORDER — BUPIVACAINE LIPOSOME 1.3 % IJ SUSP
20.0000 mL | Freq: Once | INTRAMUSCULAR | Status: AC
  Administered 2013-01-24: 20 mL
  Filled 2013-01-24: qty 20

## 2013-01-24 MED ORDER — OXYCODONE-ACETAMINOPHEN 5-325 MG PO TABS: 1.0000 | ORAL_TABLET | ORAL | Status: DC | PRN

## 2013-01-24 MED ORDER — ROCURONIUM BROMIDE 100 MG/10ML IV SOLN
INTRAVENOUS | Status: AC
  Filled 2013-01-24: qty 1

## 2013-01-24 MED ORDER — CEFAZOLIN SODIUM-DEXTROSE 2-3 GM-% IV SOLR
INTRAVENOUS | Status: AC
  Filled 2013-01-24: qty 50

## 2013-01-24 MED ORDER — SENNA 8.6 MG PO TABS
1.0000 | ORAL_TABLET | Freq: Two times a day (BID) | ORAL | Status: DC
  Administered 2013-01-24 – 2013-01-25 (×2): 8.6 mg via ORAL
  Filled 2013-01-24 (×2): qty 1

## 2013-01-24 MED ORDER — KETOROLAC TROMETHAMINE 15 MG/ML IJ SOLN
15.0000 mg | Freq: Four times a day (QID) | INTRAMUSCULAR | Status: DC
  Administered 2013-01-24 – 2013-01-25 (×4): 15 mg via INTRAVENOUS
  Filled 2013-01-24 (×6): qty 1

## 2013-01-24 MED ORDER — FENTANYL CITRATE 0.05 MG/ML IJ SOLN
INTRAMUSCULAR | Status: AC
  Filled 2013-01-24: qty 5

## 2013-01-24 MED ORDER — 0.9 % SODIUM CHLORIDE (POUR BTL) OPTIME
TOPICAL | Status: DC | PRN
  Administered 2013-01-24: 1000 mL

## 2013-01-24 MED ORDER — GLYCOPYRROLATE 0.2 MG/ML IJ SOLN
INTRAMUSCULAR | Status: DC | PRN
  Administered 2013-01-24: 0.6 mg via INTRAVENOUS

## 2013-01-24 MED ORDER — SODIUM CHLORIDE 0.9 % IJ SOLN
INTRAMUSCULAR | Status: AC
  Filled 2013-01-24: qty 10

## 2013-01-24 MED ORDER — MIDAZOLAM HCL 5 MG/5ML IJ SOLN
INTRAMUSCULAR | Status: DC | PRN
  Administered 2013-01-24: 2 mg via INTRAVENOUS

## 2013-01-24 MED ORDER — ONDANSETRON HCL 4 MG/2ML IJ SOLN: 4.0000 mg | INTRAMUSCULAR | Status: DC | PRN

## 2013-01-24 MED ORDER — PROPOFOL 10 MG/ML IV BOLUS
INTRAVENOUS | Status: AC
  Filled 2013-01-24: qty 20

## 2013-01-24 MED ORDER — ACETAMINOPHEN 500 MG PO TABS: 1000.0000 mg | ORAL_TABLET | Freq: Four times a day (QID) | ORAL | Status: DC | PRN

## 2013-01-24 MED ORDER — BISACODYL 10 MG RE SUPP: 10.0000 mg | Freq: Every day | RECTAL | Status: DC | PRN

## 2013-01-24 MED ORDER — ONDANSETRON HCL 4 MG/2ML IJ SOLN
INTRAMUSCULAR | Status: AC
  Filled 2013-01-24: qty 2

## 2013-01-24 MED ORDER — PROMETHAZINE HCL 25 MG/ML IJ SOLN: 6.2500 mg | INTRAMUSCULAR | Status: DC | PRN

## 2013-01-24 MED ORDER — KETOROLAC TROMETHAMINE 15 MG/ML IJ SOLN
INTRAMUSCULAR | Status: DC | PRN
  Administered 2013-01-24: 15 mg via INTRAVENOUS

## 2013-01-24 MED ORDER — LIDOCAINE HCL (PF) 2 % IJ SOLN
INTRAMUSCULAR | Status: DC | PRN
  Administered 2013-01-24: 75 mg via INTRADERMAL

## 2013-01-24 MED ORDER — FENTANYL CITRATE 0.05 MG/ML IJ SOLN
INTRAMUSCULAR | Status: AC
  Filled 2013-01-24: qty 2

## 2013-01-24 MED ORDER — SODIUM CHLORIDE 0.45 % IV SOLN
INTRAVENOUS | Status: DC
  Administered 2013-01-24 – 2013-01-25 (×2): via INTRAVENOUS

## 2013-01-24 MED ORDER — SODIUM CHLORIDE 0.9 % IR SOLN
Status: DC | PRN
  Administered 2013-01-24 (×2)

## 2013-01-24 MED ORDER — ROCURONIUM BROMIDE 100 MG/10ML IV SOLN
INTRAVENOUS | Status: DC | PRN
  Administered 2013-01-24: 20 mg via INTRAVENOUS
  Administered 2013-01-24: 10 mg via INTRAVENOUS
  Administered 2013-01-24: 40 mg via INTRAVENOUS
  Administered 2013-01-24 (×2): 20 mg via INTRAVENOUS
  Administered 2013-01-24: 10 mg via INTRAVENOUS

## 2013-01-24 MED ORDER — ONDANSETRON HCL 4 MG/2ML IJ SOLN
INTRAMUSCULAR | Status: DC | PRN
  Administered 2013-01-24: 4 mg via INTRAVENOUS

## 2013-01-24 MED ORDER — CIPROFLOXACIN HCL 500 MG PO TABS: 500.0000 mg | ORAL_TABLET | Freq: Two times a day (BID) | ORAL | Status: DC

## 2013-01-24 MED ORDER — FENTANYL CITRATE 0.05 MG/ML IJ SOLN
INTRAMUSCULAR | Status: DC | PRN
  Administered 2013-01-24: 100 ug via INTRAVENOUS
  Administered 2013-01-24 (×2): 50 ug via INTRAVENOUS
  Administered 2013-01-24: 100 ug via INTRAVENOUS
  Administered 2013-01-24: 50 ug via INTRAVENOUS

## 2013-01-24 MED ORDER — LACTATED RINGERS IV SOLN
INTRAVENOUS | Status: DC
  Administered 2013-01-24: 11:00:00 via INTRAVENOUS
  Administered 2013-01-24: 1000 mL via INTRAVENOUS

## 2013-01-24 MED ORDER — NEOSTIGMINE METHYLSULFATE 1 MG/ML IJ SOLN
INTRAMUSCULAR | Status: AC
  Filled 2013-01-24: qty 10

## 2013-01-24 MED ORDER — BACITRACIN-NEOMYCIN-POLYMYXIN 400-5-5000 EX OINT: 1.0000 "application " | TOPICAL_OINTMENT | Freq: Three times a day (TID) | CUTANEOUS | Status: DC | PRN

## 2013-01-24 MED ORDER — HYDROMORPHONE HCL PF 1 MG/ML IJ SOLN
0.5000 mg | INTRAMUSCULAR | Status: DC | PRN
  Administered 2013-01-24 – 2013-01-25 (×3): 1 mg via INTRAVENOUS
  Filled 2013-01-24 (×3): qty 1

## 2013-01-24 MED ORDER — CIPROFLOXACIN HCL 500 MG PO TABS
500.0000 mg | ORAL_TABLET | Freq: Two times a day (BID) | ORAL | Status: DC
Start: 2013-01-24 — End: 2013-01-25
  Administered 2013-01-24 – 2013-01-25 (×2): 500 mg via ORAL
  Filled 2013-01-24 (×4): qty 1

## 2013-01-24 MED ORDER — FENTANYL CITRATE 0.05 MG/ML IJ SOLN: 25.0000 ug | INTRAMUSCULAR | Status: DC | PRN

## 2013-01-24 SURGICAL SUPPLY — 52 items
ADH SKN CLS APL DERMABOND .7 (GAUZE/BANDAGES/DRESSINGS) ×1
APL SKNCLS STERI-STRIP NONHPOA (GAUZE/BANDAGES/DRESSINGS)
BAG URINE DRAINAGE (UROLOGICAL SUPPLIES) ×2 IMPLANT
BANDAGE COBAN STERILE 2 (GAUZE/BANDAGES/DRESSINGS) IMPLANT
BENZOIN TINCTURE PRP APPL 2/3 (GAUZE/BANDAGES/DRESSINGS) ×1 IMPLANT
BLADE HEX COATED 2.75 (ELECTRODE) ×2 IMPLANT
BNDG GAUZE ELAST 4 BULKY (GAUZE/BANDAGES/DRESSINGS) ×1 IMPLANT
BRIEF STRETCH FOR OB PAD LRG (UNDERPADS AND DIAPERS) ×2 IMPLANT
CANISTER SUCTION 2500CC (MISCELLANEOUS) ×1 IMPLANT
CATH FOLEY 2WAY SLVR  5CC 16FR (CATHETERS) ×1
CATH FOLEY 2WAY SLVR 5CC 16FR (CATHETERS) ×1 IMPLANT
COVER MAYO STAND STRL (DRAPES) ×2 IMPLANT
DERMABOND ADVANCED (GAUZE/BANDAGES/DRESSINGS) ×1
DERMABOND ADVANCED .7 DNX12 (GAUZE/BANDAGES/DRESSINGS) IMPLANT
DISSECTOR ROUND CHERRY 3/8 STR (MISCELLANEOUS) ×2 IMPLANT
DRAPE LAPAROTOMY T 102X78X121 (DRAPES) ×2 IMPLANT
DRAPE UTILITY 15X26 (DRAPE) ×2 IMPLANT
DRSG TEGADERM 4X4.75 (GAUZE/BANDAGES/DRESSINGS) ×1 IMPLANT
GLOVE BIOGEL M STRL SZ7.5 (GLOVE) ×2 IMPLANT
GOWN STRL REIN XL XLG (GOWN DISPOSABLE) ×2 IMPLANT
KIT ASSEMBLY MENTOR (KITS) ×1 IMPLANT
KIT BASIN OR (CUSTOM PROCEDURE TRAY) ×2 IMPLANT
LUBRICANT JELLY ST 5GR 8946 (MISCELLANEOUS) ×5 IMPLANT
NDL HYPO 25X1 1.5 SAFETY (NEEDLE) IMPLANT
NEEDLE HYPO 25X1 1.5 SAFETY (NEEDLE) ×2 IMPLANT
NS IRRIG 1000ML POUR BTL (IV SOLUTION) ×1 IMPLANT
PACK GENERAL/GYN (CUSTOM PROCEDURE TRAY) ×2 IMPLANT
PLUG CATH AND CAP STER (CATHETERS) ×2 IMPLANT
PROS TITAN SCROT 0 ANG 16CM (ERECTILE RESTORATION) ×2
PROSTHESIS TTN SCRO 0 ANG 16CM (ERECTILE RESTORATION) IMPLANT
RESERVOIR 75CC LOCKOUT BIOFLEX (Erectile Restoration) ×1 IMPLANT
RETRACTOR WILSON SYSTEM (INSTRUMENTS) ×1 IMPLANT
SCRUB PCMX 4 OZ (MISCELLANEOUS) ×1 IMPLANT
SOL PREP POV-IOD 16OZ 10% (MISCELLANEOUS) ×2 IMPLANT
SOL PREP PROV IODINE SCRUB 4OZ (MISCELLANEOUS) ×1 IMPLANT
SPONGE GAUZE 4X4 12PLY (GAUZE/BANDAGES/DRESSINGS) IMPLANT
SPONGE LAP 4X18 X RAY DECT (DISPOSABLE) IMPLANT
STAPLER VISISTAT 35W (STAPLE) ×1 IMPLANT
STRIP CLOSURE SKIN 1/2X4 (GAUZE/BANDAGES/DRESSINGS) ×1 IMPLANT
SUT VIC AB 2-0 UR6 27 (SUTURE) ×11 IMPLANT
SUT VIC AB 3-0 54XBRD REEL (SUTURE) ×1 IMPLANT
SUT VIC AB 3-0 BRD 54 (SUTURE)
SUT VIC AB 3-0 PS2 18 (SUTURE) ×2
SUT VIC AB 3-0 PS2 18XBRD (SUTURE) IMPLANT
SUT VIC AB 3-0 SH 27 (SUTURE) ×4
SUT VIC AB 3-0 SH 27XBRD (SUTURE) IMPLANT
SYR 20CC LL (SYRINGE) ×1 IMPLANT
SYR 50ML LL SCALE MARK (SYRINGE) ×4 IMPLANT
SYR CONTROL 10ML LL (SYRINGE) ×1 IMPLANT
TOWEL OR 17X26 10 PK STRL BLUE (TOWEL DISPOSABLE) ×2 IMPLANT
TOWEL OR NON WOVEN STRL DISP B (DISPOSABLE) ×2 IMPLANT
WATER STERILE IRR 1500ML POUR (IV SOLUTION) ×1 IMPLANT

## 2013-01-24 NOTE — H&P (Signed)
cc: Dr. Bjorn Pippin cc: Dr. Shaune Pollack, [place]   Reason For Visit  Consult to discuss IPP   Active Problems Problems  1. Hypogonadism 257.2 2. Libido, Decreased 799.81 3. Organic Impotence 607.84 4. Urinary Incontinence Without Sensory Awareness 788.34  History of Present Illness            62 yo male referred by Dr. Annabell Howells for consult to discuss penile prosthesis. He is a retired Advice worker, who was initially evaluated by Dr. Earlene Plater in 2009 for ED, and treated with PD5 inhibitors. After failing medication, he was successfully treated wothprostaglandin injection throughout 2010, until this Rx failed. Duplex doppler evaluation showed good arterial response to prostaglandin with 47cc/sec peak systolic flow in  the Right corpora, and 31cc/sec. peak systolic flow in the Left corpora cavernosa. However, he was noted to havew Venous Leak Syndrome, with elevated end-diastolic flow. This was treated with a penile ring, and he has been successful until the last year when he has noted that, although he is using the VED successfully, it  causes pain and ejaculatory dysfunction Note his PMH of CVAs, hypogonadism, and ischemic heart and bowel disease.       His libido is down,  and he thought he had a normal testosterone on a recent hospital admission at [facility], but it appears he had a TSH and not a testosterone.  He did have hypogonadism in the past,  and was treated with Androgel by Dr. Talmage Nap with good response.      He has a hx of urge incontinence secondary to multiple strokes.  He was treated with Myrbetriq but it didn't help much and wasn't on his formulary.  He was also treated with Vesicare 10mg  daily, but, although his  symptoms  improved, he was treated by a neurologist for a syncopal episode, thought 2ndary Vesicare, and the medication was stopped.  He has had minimal nocturia.  He has had no recent accidents.  He is emptying well with minimal PVR.     He has had one prior stone.   He has had some intermittant moderate flank pain that has been greater on the left than the right.   He doesn't have that currently.  He has had no hematuria.   Past Medical History Problems  1. History of  Adult Sleep Apnea 780.57 2. History of  Anxiety (Symptom) 300.00 3. History of  Atherosclerosis 440.9 4. History of  Depression 311 5. History of  Head Injury 959.01 6. History of  Hypogonadism 257.2 7. History of  Stroke Syndrome 436  Surgical History Problems  1. History of  Patent Foramen Ovale Closure 2. History of  Surgery Of Male Genitalia Vasectomy V25.2  Current Meds 1. Aspirin 325 MG Oral Tablet; Therapy: (Recorded:19Jul2013) to 2. Centrum Silver TABS; Therapy: (Recorded:19Jul2013) to 3. CloNIDine HCl 0.1 MG Oral Tablet; Therapy: (Recorded:30Aug2013) to 4. Crestor 20 MG Oral Tablet; Therapy: (Recorded:19Jul2013) to 5. Donepezil HCl 10 MG Oral Tablet; Therapy: (Recorded:19Jul2013) to 6. Fish Oil CAPS; Therapy: (Recorded:19Jul2013) to 7. Sertraline HCl TABS; Therapy: (Recorded:19Jul2013) to 8. VESIcare 10 MG Oral Tablet; Take 1 tablet daily; Therapy: 20Aug2013 to  (Evaluate:04Oct2014)  Requested for: 10Oct2013; Last Rx:09Oct2013 9. Vitamin B-1 TABS; Therapy: (Recorded:19Jul2013) to  Allergies Medication  1. Divalproex Sodium TBEC 2. Nuedexta CAPS 3. OXcarbazepine TABS Non-Medication  4. Bee sting  Family History Problems  1. Maternal history of  Anxiety (Symptom) 2. Maternal history of  Congestive Heart Failure 3. Family history of  Death In The  Family Father 4. Family history of  Death In The Family Mother 5. Maternal history of  Depression 6. Family history of  Family Health Status Number Of Children 7. Maternal history of  Heart Disease V17.49 8. Family history of  Peripheral Vascular Disease 9. Paternal history of  Prostate Cancer V16.42 10. Family history of  Renal Failure 11. Paternal history of  Stroke Syndrome V17.1 12. Maternal history of  Stroke  Syndrome V17.1  Social History Problems    Alcohol Use   Caffeine Use   Former Smoker V15.82   Marital History - Currently Married   Occupation: Retired  Review of Systems Genitourinary, constitutional, skin, eye, otolaryngeal, hematologic/lymphatic, cardiovascular, pulmonary, endocrine, musculoskeletal, gastrointestinal, neurological and psychiatric system(s) were reviewed and pertinent findings if present are noted.  Genitourinary: nocturia, incontinence, decreased libido and erectile dysfunction.    Vitals Vital Signs [Data Includes: Last 1 Day]  15Jan2014 04:43PM  Blood Pressure: 149 / 80 Temperature: 97.5 F Heart Rate: 57  Physical Exam Constitutional: Well nourished and well developed . No acute distress.  ENT:. The ears and nose are normal in appearance.  Neck: The appearance of the neck is normal and no neck mass is present.  Pulmonary: No respiratory distress and normal respiratory rhythm and effort.  Cardiovascular:. No peripheral edema.  Abdomen: The abdomen is soft and nontender. No masses are palpated. No CVA tenderness. No hernias are palpable. No hepatosplenomegaly noted.  Genitourinary: Examination of the penis demonstrates no discharge, no masses, no lesions and a normal meatus. The penis is circumcised. The scrotum is normal in appearance and without lesions. Examination of the right scrotum demonstrates no hydrocele and no mass. Examination of the left scrotum demostrates no hydrocele and no mass. The right epididymis is palpably normal and non-tender. The left epididymis is palpably normal and non-tender. The right testis is palpably normal, non-tender and without masses. The left testis is normal, non-tender and without masses.  Lymphatics: The femoral and inguinal nodes are not enlarged or tender.  Skin: Normal skin turgor, no visible rash and no visible skin lesions.  Neuro/Psych:. Mood and affect are appropriate.    Results/Data Selected Results   TESTOSTERONE PANEL 05Dec2013 09:58AM Bjorn Pippin  CHART: [id number]   Test Name Result Flag Reference  TESTOSTERONE, TOTAL 326.42 ng/dL  [id number]  Tanner Stage       Male              Male               I              < 30 ng/dL        < 10 ng/dL               II             < 150 ng/dL       < 30 ng/dL               III            100-320 ng/dL     < 35 ng/dL               IV             200-970 ng/dL     24-40 ng/dL               V/Adult        300-890 ng/dL     10-27 ng/dL  SEX HORMONE BINDING GLOBULIN 48 nmol/L  13-71  TESTOSTERONE, FREE 50.8 pg/mL  47.0-244.0  The concentration of free testosterone is derived from a mathematical expression based on constants for the binding of testosterone to sex hormone-binding globulin and albumin.  TESTOSTERONE, %FREE 1.6 %  1.6-2.9   Assessment Assessed  1. Hypogonadism 257.2 2. Libido, Decreased 799.81 3. Organic Impotence 607.84 4. Urinary Incontinence Without Sensory Awareness 788.34    1. Organic sexual dysfunction: The patient, his wife, and I have had a 70 minute discussion with regard to his sexual dysfunction, as well as his urge incontinence. We discussed all lower treatments for his sexual dysfunction, including a history of medications, and surgery for his problem. Surgical therapy history includes history of semirigid penile prostheses, as well as inflatable penile prostheses. We've discussed the evolution of each of the prostheses, up in the patient information to read about both prostheses. I recommend he consider having implantation of 3 pieces that will penile prosthesis. We discussed approaches were inflatable prostheses, and noted that he could have the penoscrotal approach, or the infrapubic approach. Because of the patient's urinary continence, his wife is very concerned with the possibility of urinary incontinence causing infection. For this reason, we will plan infrapubic approach. Because of the patient's history of altered  neurologic status, I would advise surgical therapy was slow possible, with monitored overnight stay. We discussed how to avoid infection, and he has done research online with regard to infection the prostheses, and is quite interested in surgical technique of a physician at [facility] in [place]. We discussed surgical technique, changing of gloves during surgery, use of Betadine impregnated plastic drapes during surgery, and using the "no touch" technique. These are all standard technique. I have reassured the patient, and we'll cover him with antibiotics pre-and postoperatively. The patient we used Hibiclens the night before in the morning of surgery to washes belly button, and his pubis, scrotum, and penis.  Postoperative, he understands that infection is undergoing concern. He will not use ice on the scrotum, because I do not want the pump to contract in the scrotum. In addition, he understands that his recovery will take 6+ weeks. We will use long-acting local anesthetic(expariel), in order to avoid as much narcotic postoperatively as possible.  I have discussed a partial history and experienced the penile procedures with the patient. I believe he is comfortable with potential complications of infection. We have discussed problems with prostheses, with the silicone impregnated prostheses, with the different openings that make prostheses, and why I would choose Coloplast prostheses at this time for him. He is comfortable with our conversation, and will consider his options, he may call to schedule his prosthesis. 2. Urge incontinence: We have discussed his urge incontinence. I believe his urgency and urge incontinence probably secondary to his CVA. We discussed the physiologic principles with regard to urge and urge incontinence. He is not a good candidate for anticholinergic medication. He may do well with physical therapy, or possibly PT and is. However, he needs to have video urodynamics, and  then we will refer to Dr. Wilson Singer for further evaluation. Ambien, the patient could be a candidate for Botox therapy. We discussed the levels of treatment for the patient, and he would be interested in physical therapy evaluation, as well as PTNS ( wife very interested).   Plan   1. Schedule Coloplast IPP with new smaller scrotal pump. Expariel. 2. Schedule videourodynamics for urge incontinence. Reref to Dr. Annabell Howells for consideration  of PT, PTNS, and Botox.   Signatures  Pt notes resolution of his urinary incontience, and is now ready for his IPP. He desires scrotal approach.

## 2013-01-24 NOTE — Interval H&P Note (Signed)
History and Physical Interval Note:  01/24/2013 9:10 AM  [name]  has presented today for surgery, with the diagnosis of Organic Sexual Dysfunction  The various methods of treatment have been discussed with the patient and family. After consideration of risks, benefits and other options for treatment, the patient has consented to  Procedure(s): IMPLANTATION OF A COLOPLAST 3 PIECE PENILE PROTHESIS INFLATABLE/REMOVAL OF SCROTAL SEBACEOUS CYST (N/A) as a surgical intervention .  The patient's history has been reviewed, patient examined, no change in status, stable for surgery.  I have reviewed the patient's chart and labs.  Questions were answered to the patient's satisfaction.     Jethro Bolus I

## 2013-01-24 NOTE — Op Note (Signed)
Pre-operative diagnosis : Organic sexual dysfunction, multiple scrotal sebaceous cysts.   Postoperative diagnosis:   Same  Operation:  Implantation of Coloplast Titan 3-piece  Inflatable penile prosthesis, ( 16cm x 4cm RTE); excision of multiple scrotal sebaceous cysts.   Surgeon:  Kathie Rhodes. Patsi Sears, MD  First assistant:  none  Anesthesia:  General ET  Preparation:  After appropriate pre-anesthesia, the patient was brought to the operating room and placed upon the OR table in the dorsal supine position, where GET anesthesia was introduced. The history was reviewed, and the armband had been double-checked. The scrotum and the pubis were washed for 10 minutes with antibiotic soap scrub, then the operative area was prepped with Betadine and draped in the usual position.   Review history:  Hypogonadism 257.2  2. Libido, Decreased 799.81  3. 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 Urinary Incontinence Without Sensory Awareness 788.34  History of Present Illness  62 yo male referred by Dr. Annabell Howells for consult to discuss penile prosthesis. He is a retired Advice worker, who was initially evaluated by Dr. Earlene Plater in 2009 for ED, and treated with PD5 inhibitors. After failing medication, he was successfully treated wothprostaglandin injection throughout 2010, until this Rx failed. Duplex doppler evaluation showed good arterial response to prostaglandin with 47cc/sec peak systolic flow in the Right corpora, and 31cc/sec. peak systolic flow in the Left corpora cavernosa. However, he was noted to havew Venous Leak Syndrome, with elevated end-diastolic flow. This was treated with a penile ring, and he has been successful until the last year when he has noted that, although he is using the VED successfully, it causes pain and ejaculatory dysfunction Note his PMH of CVAs, hypogonadism, and ischemic heart and bowel disease.  His libido is down, and he thought he had a normal testosterone on a recent hospital admission at  [facility], but it appears he had a TSH and not a testosterone. He did have hypogonadism in the past, and was treated with Androgel by Dr. Talmage Nap with good response.    Statement of  Likelihood of Success: Excellent. TIME-OUT observed.:  Procedure:  Foley catheter was placed atraumatically, with 10cc in the balloon. The [facility] Figure of Eight retractor was then placed, and a 6 cm penoscrotal incision was made. The edges were cauterized, and retracted. The corpora cavernosa were identified bilaterally, and 2 separate 2-0 vicryl sutures were placed in the corpora bilaterally-medially and laterally-with care taken to avoid injury to the urethra. Once the corpora were defined, bilateral corporotomies were made,  And the corpora were dilated to 13 cm bilaterally, both proximally and distalward. Bleeding was noted. Measurements were taken, and were equal bilaterally, measuring 10cm distal, and proximally. A 16cm prosthesis was selected, with 4 cm of RTC ( 1cm + 3cm), and these were soaked in antibiotic solution. Trans pubic, inguinal dissection through the tranversalis fascia, and the prepared 60cc resevoir was placed. The resevoir was tested, and allowed to seat itself. The fascia was closed with 2  Separate 2-0 vicryl sutures.   Attention was paid to placement of the cylinders, and the cylinders were placed bolaterally with the use of the Furlough inserter. The cylinders were tested, and excellent equal bilateral inflation of the cylinders was noted. The corpora cavernosa were closed with running 2-0 vicryl sutures.    The pump was then placed in a scrotal location, prepared in the right scrotum. The pump was isolated in the scrotum, and then tested in vivo prior to connecting  to the resevoir. This was  accomplished in standard fashion, without tension. Antibiotic irrigation was accomplished, and  The wound was closed with 2-0 and 3-0 vicryl sutures.    Attention was then directed to the multiple scrotal  sebaceous cysts. 1 moderate cyst and 5 small cysts werwe located on the L hemiscrotum, and these were removed with a 15 blade, and sent to pathology. The wound was closed with 3-0 vicryl sutures, and then Neosporin was placed on the smallest excision sites, that didn't require suturing. 3 more small sebaceous cysts were identified on the L side, and removed. . Several more areas of potential cysts were also identified, but not removed.  Exparel was injected in to the wounds, and sterile dressing was applied, and the patient was awakened and taken to the PAR in good position.

## 2013-01-24 NOTE — Anesthesia Postprocedure Evaluation (Signed)
  Anesthesia Post-op Note  Patient: [name]  Procedure(s) Performed: Procedure(s) (LRB): IMPLANTATION OF A COLOPLAST 3 PIECE PENILE PROTHESIS INFLATABLE/REMOVAL OF SCROTAL SEBACEOUS CYST (N/A)  Patient Location: PACU  Anesthesia Type: General  Level of Consciousness: awake and alert   Airway and Oxygen Therapy: Patient Spontanous Breathing  Post-op Pain: mild  Post-op Assessment: Post-op Vital signs reviewed, Patient's Cardiovascular Status Stable, Respiratory Function Stable, Patent Airway and No signs of Nausea or vomiting  Last Vitals:  Filed Vitals:   01/24/13 1230  BP: 132/70  Pulse: 71  Temp:   Resp: 18    Post-op Vital Signs: stable   Complications: No apparent anesthesia complications

## 2013-01-24 NOTE — Transfer of Care (Signed)
Immediate Anesthesia Transfer of Care Note  Patient: [name]  Procedure(s) Performed: Procedure(s): IMPLANTATION OF A COLOPLAST 3 PIECE PENILE PROTHESIS INFLATABLE/REMOVAL OF SCROTAL SEBACEOUS CYST (N/A)  Patient Location: PACU  Anesthesia Type:General  Level of Consciousness: awake, alert  and oriented  Airway & Oxygen Therapy: Patient Spontanous Breathing and Patient connected to face mask oxygen  Post-op Assessment: Report given to PACU RN, Post -op Vital signs reviewed and stable and Patient moving all extremities X 4  Post vital signs: Reviewed and stable  Complications: No apparent anesthesia complications

## 2013-01-24 NOTE — Anesthesia Preprocedure Evaluation (Addendum)
Anesthesia Evaluation  Patient identified by MRN, date of birth, ID band Patient awake    Reviewed: Allergy & Precautions, H&P , NPO status , Patient's Chart, lab work & pertinent test results  Airway Mallampati: II TM Distance: >3 FB Neck ROM: Full    Dental no notable dental hx.    Pulmonary sleep apnea , former smoker,  breath sounds clear to auscultation  Pulmonary exam normal       Cardiovascular hypertension, Pt. on medications Rhythm:Regular Rate:Normal     Neuro/Psych CVA, Residual Symptoms negative psych ROS   GI/Hepatic negative GI ROS, Neg liver ROS,   Endo/Other  negative endocrine ROS  Renal/GU negative Renal ROS  negative genitourinary   Musculoskeletal negative musculoskeletal ROS (+)   Abdominal   Peds negative pediatric ROS (+)  Hematology negative hematology ROS (+)   Anesthesia Other Findings   Reproductive/Obstetrics negative OB ROS                          Anesthesia Physical Anesthesia Plan  ASA: III  Anesthesia Plan: General   Post-op Pain Management:    Induction: Intravenous  Airway Management Planned: Oral ETT  Additional Equipment:   Intra-op Plan:   Post-operative Plan: Extubation in OR  Informed Consent: I have reviewed the patients History and Physical, chart, labs and discussed the procedure including the risks, benefits and alternatives for the proposed anesthesia with the patient or authorized representative who has indicated his/her understanding and acceptance.   Dental advisory given  Plan Discussed with: CRNA and Surgeon  Anesthesia Plan Comments:        Anesthesia Quick Evaluation

## 2013-01-24 NOTE — Progress Notes (Signed)
Urology Progress Note  Day of Surgery   Subjective: Pt walking. C/i R inguina discomfort: ( location of surgery to place the reservoir.     No acute urologic events overnight. Ambulation:   positive Flatus:    positive Bowel movement  negative  Pain: some relief: Dilauded x 1.   Objective:  Blood pressure 112/53, pulse 82, temperature 98.6 F (37 C), temperature source Oral, resp. rate 20, height 6\' 1"  (1.854 m), weight 98.884 kg (218 lb), SpO2 95.00%.  Physical Exam:  General:  No acute distress, awake Extremities: extremities normal, atraumatic, no cyanosis or edema Genitourinary:   Penile bruising: mild. Tenderness: R groin > penile shaft. Wounds post sebacious cyst removal look good.  Foley:yes, until 6 AM.        No results found for this basename: HGB, WBC, PLT,  in the last 72 hours  No results found for this basename: NA, K, CL, CO2, BUN, CREATININE, CALCIUM, MAGNESIUM, GFRNONAA, GFRAA,  in the last 72 hours   No results found for this basename: PT, INR, APTT,  in the last 72 hours   No components found with this basename: ABG,   Assessment/Plan:  Catheter not removed. Foley out at 6 am.  Continue any current medications. Add toradol 15mg  q 6h to try to decrease need for pain med ( sleep apnea, but does not use machine).  Wound care discussed. Follow up in AM. D/c planning for AM.

## 2013-01-25 ENCOUNTER — Encounter (HOSPITAL_COMMUNITY): Payer: Self-pay | Admitting: Urology

## 2013-01-25 NOTE — Progress Notes (Signed)
1 Day Post-Op Subjective: Patient reports feeling well. Foley out. + void. Minimal pain. Minimal bleeding from scrotal surgery sites.   Objective: Vital signs in last 24 hours: Temp:  [97.4 F (36.3 C)-99.8 F (37.7 C)] 99 F (37.2 C) (12/09 0538) Pulse Rate:  [62-82] 62 (12/09 0538) Resp:  [16-20] 18 (12/09 0538) BP: (103-145)/(53-89) 112/55 mmHg (12/09 0538) SpO2:  [93 %-100 %] 96 % (12/09 0538)  Intake/Output from previous day: 12/08 0701 - 12/09 0700 In: 3668.8 [P.O.:560; I.V.:3108.8] Out: 1600 [Urine:1550; Blood:50] Intake/Output this shift: Total I/O In: 360 [P.O.:360] Out: 50 [Urine:50]  Physical Exam:  General:alert and cooperative GI: not done Male genitalia: not done no bladder distension noted Penis: circumcised and shaft bruising. Urethral Meatus: normal Testicles: normal, no masses Scrotum: No erythema. No swelling. Minimal pain following sebaceous cyst removal ( > 6 removed) Extremities: extremities normal, atraumatic, no cyanosis or edema  Lab Results: No results found for this basename: HGB, HCT,  in the last 72 hours BMET No results found for this basename: NA, K, CL, CO2, GLUCOSE, BUN, CREATININE, CALCIUM,  in the last 72 hours No results found for this basename: LABPT, INR,  in the last 72 hours No results found for this basename: LABURIN,  in the last 72 hours No results found for this or any previous visit.  Studies/Results: No results found.  Assessment/Plan: Continue ABX therapy due to Prophylaxis for patient with diagnosis inflatable prosthesis.  Will d/c on cipro and percocet.  Heat to scrotum. Avoid ice to avoid contraction of scrotal pump.  RTC per appointment.    LOS: 1 day   Jasiya Markie I 01/25/2013, 8:48 AM

## 2013-01-25 NOTE — Discharge Summary (Signed)
  Physician Discharge Summary  Patient ID: [name] MRN: [id number] DOB/AGE: Aug 11, 1950 62 y.o.  Admit date: 01/24/2013 Discharge date: 01/25/2013  Admission Diagnoses: Organic Sexual Dysfunction  Discharge Diagnoses:  Active Problems:   Impotence   Discharged Condition:  Stable  Hospital Course:   Surgery  Consults: none  Significant Diagnostic Studies: No results found.  Treatments: none  Discharge Exam: Blood pressure 112/55, pulse 62, temperature 99 F (37.2 C), temperature source Oral, resp. rate 18, height 6\' 1"  (1.854 m), weight 98.884 kg (218 lb), SpO2 96.00%. General appearance: alert and cooperative Male genitalia: normal, abnormal findings: see PE above  Disposition: 01-Home or Self Care  Discharge Orders   Future Orders Complete By Expires   Change dressing  As directed    Scheduling Instructions:     Clean mesh pants for d/c Replace scrotal dressing, each day after showering.   Discharge patient  As directed    Discontinue IV  As directed        Medication List    STOP taking these medications       aspirin EC 325 MG tablet      TAKE these medications       acetaminophen 500 MG tablet  Commonly known as:  TYLENOL  Take 1,000 mg by mouth every 6 (six) hours as needed for mild pain or moderate pain. For pain     calcium citrate-vitamin D 315-200 MG-UNIT per tablet  Commonly known as:  CITRACAL+D  Take 2 tablets by mouth daily.     CENTRUM SILVER ULTRA MENS PO  Take 1 tablet by mouth daily.     ciprofloxacin 500 MG tablet  Commonly known as:  CIPRO  Take 1 tablet (500 mg total) by mouth 2 (two) times daily.     Fish Oil 1200 MG Caps  Take 1 capsule by mouth 2 (two) times daily.     oxyCODONE-acetaminophen 5-325 MG per tablet  Commonly known as:  ROXICET  Take 1 tablet by mouth every 4 (four) hours as needed for severe pain.     sertraline 100 MG tablet  Commonly known as:  ZOLOFT  Take 175 mg by mouth daily with breakfast.      thiamine 100 MG tablet  Commonly known as:  VITAMIN B-1  Take 100 mg by mouth daily.           Follow-up Information   Follow up with Kathi Ludwig, MD.   Specialty:  Urology   Contact information:   [address] [place] [address] [phone]       Follow up with Jethro Bolus I, MD. (per appointment)    Specialty:  Urology   Contact information:   [address] [place] [address] [phone]      D/C meds: Cipro, percocet Heat to scrotum. Elevate scrotum Avoid intercourse until cleared.  RTC for post-op check.  SignedJethro Bolus I 01/25/2013, 8:56 AM

## 2013-03-02 ENCOUNTER — Telehealth: Payer: Self-pay | Admitting: *Deleted

## 2013-03-02 NOTE — Telephone Encounter (Signed)
Reminder appt made 

## 2013-03-02 NOTE — Telephone Encounter (Signed)
Pt called stating he spoke with someone in our office 2 weeks ago about a procedure Dr. Gala Romney done on him back in 2009 pt states Dr. Gala Romney did not tell him if he would be due again in 5 years or 10, pt stated he is a Dr. Gala Romney pt. Pt states whoever he spoke with 2 weeks ago told him they would send Dr. Gala Romney a email and he has not heard anything, pt is upset, pt asked me if Dr. Gala Romney got his email I made pt aware I can not look in Dr. Roseanne Kaufman email. I told pt he is on a recall for Dr. Laural Golden for a colonoscopy it was a 7 year recall, pt states Dr. Laural Golden is past due for calling him for that I made pt aware that he will be notified 07/18/2013 that is what the recall says. 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 Gala Romney is in the office and he is seeing pts today, pt gave me his number to give to Dr. Gala Romney [phone], pt had me repeat his number back to him, pt stated to also dial [phone]). Pt stated to write it down and put it on [place] desk, and send him a email. Pt hung up the phone on me.

## 2013-03-02 NOTE — Telephone Encounter (Signed)
I called patient and spoke with him today. I apologized for any breakdown in communication with [place] although his reported telephone call 2 weeks ago is not documented here anywhere. He wants to have another colonoscopy by me when it is time. Dr. Laural Golden removed a single subcentimeter adenoma from his colon in 2008. So, he is in the window (5-10 years since his last examination) for a followup colonoscopy. He reports he is doing well and doesn't want to have one any earlier than he really needs to. Therefore, I recommended we make an appointment for him to be seen by me in January or February of 2016 to set up a surveillance colonoscopy. If he develops any interim problems, he is to let me know.

## 2013-03-02 NOTE — Telephone Encounter (Signed)
I have not spoken with the patient. I called and spoke with Tammy at [facility] office. She said she seems to remember hearing something about him recently and would check with Lelon Frohlich when she gets back from her meeting.

## 2013-03-23 DIAGNOSIS — N529 Male erectile dysfunction, unspecified: Secondary | ICD-10-CM | POA: Diagnosis not present

## 2013-04-01 ENCOUNTER — Ambulatory Visit (INDEPENDENT_AMBULATORY_CARE_PROVIDER_SITE_OTHER): Payer: Medicare Other | Admitting: Cardiovascular Disease

## 2013-04-01 ENCOUNTER — Encounter: Payer: Self-pay | Admitting: Cardiovascular Disease

## 2013-04-01 VITALS — BP 138/87 | HR 68 | Ht 73.0 in | Wt 229.0 lb

## 2013-04-01 DIAGNOSIS — Q211 Atrial septal defect: Secondary | ICD-10-CM | POA: Diagnosis not present

## 2013-04-01 DIAGNOSIS — I498 Other specified cardiac arrhythmias: Secondary | ICD-10-CM

## 2013-04-01 DIAGNOSIS — Q2111 Secundum atrial septal defect: Secondary | ICD-10-CM

## 2013-04-01 DIAGNOSIS — I10 Essential (primary) hypertension: Secondary | ICD-10-CM

## 2013-04-01 DIAGNOSIS — F528 Other sexual dysfunction not due to a substance or known physiological condition: Secondary | ICD-10-CM | POA: Diagnosis not present

## 2013-04-01 DIAGNOSIS — R001 Bradycardia, unspecified: Secondary | ICD-10-CM

## 2013-04-01 NOTE — Assessment & Plan Note (Signed)
Well controlled.  Continue current medications and low sodium Dash type diet.    

## 2013-04-01 NOTE — Progress Notes (Signed)
Patient ID: [name], male   DOB: 06/24/1950, 63 y.o.   MRN: [id number] 63 yo last seen by me in 2010 History of TIAls and unusual neuro signs with CVA. Sent to [facility] for occluder device for PFO. Had normal cath in 2006 Recently admitted to [facility] with bradycardia and hypotension. Thought due to aricept and namenda. No history of intrinsic conduction disease. Feels much better off statins and aricept with no bradycardia and improved memory. No recurrent episodes of dizzyness. Home HR and BP reading ok with HR 65-80 at rest. No chest pain dyspnea or syncope.    Echo 09/28/12 normal with no flow across occluder and normal EF   Had penile implant surgery and is recovering nicely.  Sees [facility]   ROS: Denies fever, malais, weight loss, blurry vision, decreased visual acuity, cough, sputum, SOB, hemoptysis, pleuritic pain, palpitaitons, heartburn, abdominal pain, melena, lower extremity edema, claudication, or rash.  All other systems reviewed and negative  General: Affect appropriate Healthy:  appears stated age 63: normal  Rosacea Neck supple with no adenopathy JVP normal no bruits no thyromegaly Lungs clear with no wheezing and good diaphragmatic motion Heart:  S1/S2 no murmur, no rub, gallop or click PMI normal Abdomen: benighn, BS positve, no tenderness, no AAA no bruit.  No HSM or HJR Distal pulses intact with no bruits No edema Neuro non-focal Skin warm and dry No muscular weakness   Current Outpatient Prescriptions  Medication Sig Dispense Refill  . acetaminophen (TYLENOL) 500 MG tablet Take 1,000 mg by mouth every 6 (six) hours as needed for mild pain or moderate pain. For pain      . aspirin 325 MG EC tablet Take 325 mg by mouth daily.      . calcium citrate-vitamin D (CITRACAL+D) 315-200 MG-UNIT per tablet Take 2 tablets by mouth daily.      . Multiple Vitamins-Minerals (CENTRUM SILVER ULTRA MENS PO) Take 1 tablet by mouth daily.      . Omega-3 Fatty Acids (FISH OIL)  1200 MG CAPS Take 1 capsule by mouth 2 (two) times daily.      . sertraline (ZOLOFT) 100 MG tablet Take 150 mg by mouth daily with breakfast.       . thiamine (VITAMIN B-1) 100 MG tablet Take 100 mg by mouth daily.       No current facility-administered medications for this visit.    Allergies  Aricept; Bee venom; Namenda; Quinine derivatives; Divalproex sodium; and Statins  Electrocardiogram:  7/11 SR rate 46  PAC otherwise normal   Assessment and Plan

## 2013-04-01 NOTE — Assessment & Plan Note (Signed)
S/P penile implant Will be able to use in 2 weeks F/U Urology

## 2013-04-01 NOTE — Assessment & Plan Note (Signed)
S/P occluder device No recurrent TIAls  Echo normal 8/14  SBE prophylaxis

## 2013-04-01 NOTE — Assessment & Plan Note (Signed)
Resolved No AV block  Avoid namenda and aricept

## 2013-04-01 NOTE — Patient Instructions (Addendum)
Your physician wants you to follow-up in: 1 year  You will receive a reminder letter in the mail two months in advance. If you don't receive a letter, please call our office to schedule the follow-up appointment.  Your physician recommends that you continue on your current medications as directed. Please refer to the Current Medication list given to you today.  

## 2013-04-22 DIAGNOSIS — F330 Major depressive disorder, recurrent, mild: Secondary | ICD-10-CM | POA: Diagnosis not present

## 2013-05-05 DIAGNOSIS — R21 Rash and other nonspecific skin eruption: Secondary | ICD-10-CM | POA: Diagnosis not present

## 2013-05-05 DIAGNOSIS — F3289 Other specified depressive episodes: Secondary | ICD-10-CM | POA: Diagnosis not present

## 2013-05-05 DIAGNOSIS — I6789 Other cerebrovascular disease: Secondary | ICD-10-CM | POA: Diagnosis not present

## 2013-05-05 DIAGNOSIS — R51 Headache: Secondary | ICD-10-CM | POA: Diagnosis not present

## 2013-05-05 DIAGNOSIS — F329 Major depressive disorder, single episode, unspecified: Secondary | ICD-10-CM | POA: Diagnosis not present

## 2013-06-13 ENCOUNTER — Emergency Department (HOSPITAL_COMMUNITY): Payer: Medicare Other

## 2013-06-13 ENCOUNTER — Emergency Department (HOSPITAL_COMMUNITY)
Admission: EM | Admit: 2013-06-13 | Discharge: 2013-06-13 | Disposition: A | Discharge status: Home / Self Care | Payer: Medicare Other | Attending: Emergency Medicine | Admitting: Emergency Medicine

## 2013-06-13 ENCOUNTER — Encounter (HOSPITAL_COMMUNITY): Payer: Self-pay | Admitting: Emergency Medicine

## 2013-06-13 DIAGNOSIS — Z862 Personal history of diseases of the blood and blood-forming organs and certain disorders involving the immune mechanism: Secondary | ICD-10-CM | POA: Diagnosis not present

## 2013-06-13 DIAGNOSIS — Q211 Atrial septal defect: Secondary | ICD-10-CM | POA: Insufficient documentation

## 2013-06-13 DIAGNOSIS — F411 Generalized anxiety disorder: Secondary | ICD-10-CM | POA: Diagnosis not present

## 2013-06-13 DIAGNOSIS — Z9889 Other specified postprocedural states: Secondary | ICD-10-CM | POA: Diagnosis not present

## 2013-06-13 DIAGNOSIS — Z8679 Personal history of other diseases of the circulatory system: Secondary | ICD-10-CM | POA: Insufficient documentation

## 2013-06-13 DIAGNOSIS — G8911 Acute pain due to trauma: Secondary | ICD-10-CM | POA: Insufficient documentation

## 2013-06-13 DIAGNOSIS — F329 Major depressive disorder, single episode, unspecified: Secondary | ICD-10-CM | POA: Insufficient documentation

## 2013-06-13 DIAGNOSIS — M25569 Pain in unspecified knee: Secondary | ICD-10-CM | POA: Diagnosis not present

## 2013-06-13 DIAGNOSIS — M129 Arthropathy, unspecified: Secondary | ICD-10-CM | POA: Diagnosis not present

## 2013-06-13 DIAGNOSIS — Z7982 Long term (current) use of aspirin: Secondary | ICD-10-CM | POA: Insufficient documentation

## 2013-06-13 DIAGNOSIS — Q2111 Secundum atrial septal defect: Secondary | ICD-10-CM | POA: Diagnosis not present

## 2013-06-13 DIAGNOSIS — Z8719 Personal history of other diseases of the digestive system: Secondary | ICD-10-CM | POA: Insufficient documentation

## 2013-06-13 DIAGNOSIS — Z8673 Personal history of transient ischemic attack (TIA), and cerebral infarction without residual deficits: Secondary | ICD-10-CM | POA: Diagnosis not present

## 2013-06-13 DIAGNOSIS — M25562 Pain in left knee: Secondary | ICD-10-CM

## 2013-06-13 DIAGNOSIS — S99929A Unspecified injury of unspecified foot, initial encounter: Secondary | ICD-10-CM | POA: Diagnosis not present

## 2013-06-13 DIAGNOSIS — Z8701 Personal history of pneumonia (recurrent): Secondary | ICD-10-CM | POA: Insufficient documentation

## 2013-06-13 DIAGNOSIS — F4310 Post-traumatic stress disorder, unspecified: Secondary | ICD-10-CM | POA: Insufficient documentation

## 2013-06-13 DIAGNOSIS — Z8639 Personal history of other endocrine, nutritional and metabolic disease: Secondary | ICD-10-CM | POA: Insufficient documentation

## 2013-06-13 DIAGNOSIS — F3289 Other specified depressive episodes: Secondary | ICD-10-CM | POA: Insufficient documentation

## 2013-06-13 DIAGNOSIS — Z87891 Personal history of nicotine dependence: Secondary | ICD-10-CM | POA: Insufficient documentation

## 2013-06-13 DIAGNOSIS — Z79899 Other long term (current) drug therapy: Secondary | ICD-10-CM | POA: Diagnosis not present

## 2013-06-13 DIAGNOSIS — S8990XA Unspecified injury of unspecified lower leg, initial encounter: Secondary | ICD-10-CM | POA: Diagnosis not present

## 2013-06-13 MED ORDER — IBUPROFEN 800 MG PO TABS: 800.0000 mg | ORAL_TABLET | Freq: Three times a day (TID) | ORAL | Status: DC

## 2013-06-13 NOTE — ED Notes (Signed)
Pain lt knee after fall on 4/17, and again last night. No swelling. Pt walks with a cane.  Hx of stroke.  Alert,Increased pain with wt bearing and movement.

## 2013-06-13 NOTE — ED Notes (Signed)
Pt c/o left knee pain since falling 4/15. Pt states he feel again yesterday onto left knee, has appointment with ortho on Thursday.

## 2013-06-13 NOTE — Discharge Instructions (Signed)
Knee Pain Knee pain can be a result of an injury or other medical conditions. Treatment will depend on the cause of your pain. HOME CARE  Only take medicine as told by your doctor.  Keep a healthy weight. Being overweight can make the knee hurt more.  Stretch before exercising or playing sports.  If there is constant knee pain, change the way you exercise. Ask your doctor for advice.  Make sure shoes fit well. Choose the right shoe for the sport or activity.  Protect your knees. Wear kneepads if needed.  Rest when you are tired. GET HELP RIGHT AWAY IF:   Your knee pain does not stop.  Your knee pain does not get better.  Your knee joint feels hot to the touch.  You have a fever. MAKE SURE YOU:   Understand these instructions.  Will watch this condition.  Will get help right away if you are not doing well or get worse. Document Released: 05/02/2008 Document Revised: 04/28/2011 Document Reviewed: 05/02/2008 [employer] Patient Information 2014 [employer].  
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
## 2013-06-15 NOTE — ED Provider Notes (Signed)
Medical screening examination/treatment/procedure(s) were performed by non-physician practitioner and as supervising physician I was immediately available for consultation/collaboration.   EKG Interpretation None        Mervin Kung, MD 06/15/13 [phone]

## 2013-06-16 DIAGNOSIS — M25569 Pain in unspecified knee: Secondary | ICD-10-CM | POA: Diagnosis not present

## 2013-06-16 DIAGNOSIS — I10 Essential (primary) hypertension: Secondary | ICD-10-CM | POA: Diagnosis not present

## 2013-06-23 ENCOUNTER — Other Ambulatory Visit (HOSPITAL_COMMUNITY): Payer: Self-pay | Admitting: Orthopaedic Surgery

## 2013-06-23 DIAGNOSIS — M25569 Pain in unspecified knee: Secondary | ICD-10-CM | POA: Diagnosis not present

## 2013-06-23 DIAGNOSIS — I10 Essential (primary) hypertension: Secondary | ICD-10-CM | POA: Diagnosis not present

## 2013-06-23 DIAGNOSIS — M25562 Pain in left knee: Secondary | ICD-10-CM

## 2013-06-27 ENCOUNTER — Ambulatory Visit (HOSPITAL_COMMUNITY)
Admission: RE | Admit: 2013-06-27 | Discharge: 2013-06-27 | Disposition: A | Discharge status: Home / Self Care | Payer: Medicare Other | Source: Ambulatory Visit | Attending: Orthopaedic Surgery | Admitting: Orthopaedic Surgery

## 2013-06-27 DIAGNOSIS — M76899 Other specified enthesopathies of unspecified lower limb, excluding foot: Secondary | ICD-10-CM | POA: Insufficient documentation

## 2013-06-27 DIAGNOSIS — M7120 Synovial cyst of popliteal space [Baker], unspecified knee: Secondary | ICD-10-CM | POA: Insufficient documentation

## 2013-06-27 DIAGNOSIS — IMO0002 Reserved for concepts with insufficient information to code with codable children: Secondary | ICD-10-CM | POA: Diagnosis not present

## 2013-06-27 DIAGNOSIS — M25562 Pain in left knee: Secondary | ICD-10-CM

## 2013-06-27 DIAGNOSIS — W19XXXA Unspecified fall, initial encounter: Secondary | ICD-10-CM | POA: Insufficient documentation

## 2013-06-27 DIAGNOSIS — M259 Joint disorder, unspecified: Secondary | ICD-10-CM | POA: Diagnosis not present

## 2013-06-27 DIAGNOSIS — Y929 Unspecified place or not applicable: Secondary | ICD-10-CM | POA: Insufficient documentation

## 2013-07-05 DIAGNOSIS — I10 Essential (primary) hypertension: Secondary | ICD-10-CM | POA: Diagnosis not present

## 2013-07-05 DIAGNOSIS — M25569 Pain in unspecified knee: Secondary | ICD-10-CM | POA: Diagnosis not present

## 2013-07-21 ENCOUNTER — Encounter (INDEPENDENT_AMBULATORY_CARE_PROVIDER_SITE_OTHER): Payer: Self-pay | Admitting: *Deleted

## 2013-07-26 DIAGNOSIS — F329 Major depressive disorder, single episode, unspecified: Secondary | ICD-10-CM | POA: Diagnosis not present

## 2013-07-26 DIAGNOSIS — R413 Other amnesia: Secondary | ICD-10-CM | POA: Diagnosis not present

## 2013-07-26 DIAGNOSIS — R7989 Other specified abnormal findings of blood chemistry: Secondary | ICD-10-CM | POA: Diagnosis not present

## 2013-07-26 DIAGNOSIS — F3289 Other specified depressive episodes: Secondary | ICD-10-CM | POA: Diagnosis not present

## 2013-07-26 DIAGNOSIS — I6789 Other cerebrovascular disease: Secondary | ICD-10-CM | POA: Diagnosis not present

## 2013-07-26 DIAGNOSIS — Z125 Encounter for screening for malignant neoplasm of prostate: Secondary | ICD-10-CM | POA: Diagnosis not present

## 2013-07-26 DIAGNOSIS — R51 Headache: Secondary | ICD-10-CM | POA: Diagnosis not present

## 2013-08-03 DIAGNOSIS — M1990 Unspecified osteoarthritis, unspecified site: Secondary | ICD-10-CM | POA: Diagnosis not present

## 2013-08-03 DIAGNOSIS — F411 Generalized anxiety disorder: Secondary | ICD-10-CM | POA: Diagnosis not present

## 2013-08-03 DIAGNOSIS — F4310 Post-traumatic stress disorder, unspecified: Secondary | ICD-10-CM | POA: Diagnosis not present

## 2013-08-03 DIAGNOSIS — R7989 Other specified abnormal findings of blood chemistry: Secondary | ICD-10-CM | POA: Diagnosis not present

## 2013-09-10 ENCOUNTER — Emergency Department (HOSPITAL_COMMUNITY): Payer: Medicare Other

## 2013-09-10 ENCOUNTER — Encounter (HOSPITAL_COMMUNITY): Payer: Self-pay | Admitting: Emergency Medicine

## 2013-09-10 ENCOUNTER — Emergency Department (HOSPITAL_COMMUNITY)
Admission: EM | Admit: 2013-09-10 | Discharge: 2013-09-10 | Disposition: A | Discharge status: Home / Self Care | Payer: Medicare Other | Attending: Emergency Medicine | Admitting: Emergency Medicine

## 2013-09-10 DIAGNOSIS — W1809XA Striking against other object with subsequent fall, initial encounter: Secondary | ICD-10-CM | POA: Diagnosis not present

## 2013-09-10 DIAGNOSIS — Y929 Unspecified place or not applicable: Secondary | ICD-10-CM | POA: Insufficient documentation

## 2013-09-10 DIAGNOSIS — Z8701 Personal history of pneumonia (recurrent): Secondary | ICD-10-CM | POA: Insufficient documentation

## 2013-09-10 DIAGNOSIS — F4310 Post-traumatic stress disorder, unspecified: Secondary | ICD-10-CM | POA: Insufficient documentation

## 2013-09-10 DIAGNOSIS — R5383 Other fatigue: Secondary | ICD-10-CM

## 2013-09-10 DIAGNOSIS — Z043 Encounter for examination and observation following other accident: Secondary | ICD-10-CM | POA: Diagnosis not present

## 2013-09-10 DIAGNOSIS — G8929 Other chronic pain: Secondary | ICD-10-CM | POA: Insufficient documentation

## 2013-09-10 DIAGNOSIS — Z87828 Personal history of other (healed) physical injury and trauma: Secondary | ICD-10-CM | POA: Insufficient documentation

## 2013-09-10 DIAGNOSIS — R5381 Other malaise: Secondary | ICD-10-CM | POA: Insufficient documentation

## 2013-09-10 DIAGNOSIS — F3289 Other specified depressive episodes: Secondary | ICD-10-CM | POA: Diagnosis not present

## 2013-09-10 DIAGNOSIS — F411 Generalized anxiety disorder: Secondary | ICD-10-CM | POA: Diagnosis not present

## 2013-09-10 DIAGNOSIS — Z8679 Personal history of other diseases of the circulatory system: Secondary | ICD-10-CM | POA: Diagnosis not present

## 2013-09-10 DIAGNOSIS — Z7982 Long term (current) use of aspirin: Secondary | ICD-10-CM | POA: Diagnosis not present

## 2013-09-10 DIAGNOSIS — M6281 Muscle weakness (generalized): Secondary | ICD-10-CM | POA: Diagnosis not present

## 2013-09-10 DIAGNOSIS — Z862 Personal history of diseases of the blood and blood-forming organs and certain disorders involving the immune mechanism: Secondary | ICD-10-CM | POA: Diagnosis not present

## 2013-09-10 DIAGNOSIS — Z8673 Personal history of transient ischemic attack (TIA), and cerebral infarction without residual deficits: Secondary | ICD-10-CM | POA: Diagnosis not present

## 2013-09-10 DIAGNOSIS — F329 Major depressive disorder, single episode, unspecified: Secondary | ICD-10-CM | POA: Diagnosis not present

## 2013-09-10 DIAGNOSIS — W19XXXA Unspecified fall, initial encounter: Secondary | ICD-10-CM

## 2013-09-10 DIAGNOSIS — Z9889 Other specified postprocedural states: Secondary | ICD-10-CM | POA: Diagnosis not present

## 2013-09-10 DIAGNOSIS — M129 Arthropathy, unspecified: Secondary | ICD-10-CM | POA: Insufficient documentation

## 2013-09-10 DIAGNOSIS — Y92009 Unspecified place in unspecified non-institutional (private) residence as the place of occurrence of the external cause: Secondary | ICD-10-CM

## 2013-09-10 DIAGNOSIS — Z8774 Personal history of (corrected) congenital malformations of heart and circulatory system: Secondary | ICD-10-CM | POA: Diagnosis not present

## 2013-09-10 DIAGNOSIS — Z79899 Other long term (current) drug therapy: Secondary | ICD-10-CM | POA: Diagnosis not present

## 2013-09-10 DIAGNOSIS — R55 Syncope and collapse: Secondary | ICD-10-CM | POA: Diagnosis not present

## 2013-09-10 DIAGNOSIS — G43709 Chronic migraine without aura, not intractable, without status migrainosus: Secondary | ICD-10-CM | POA: Insufficient documentation

## 2013-09-10 DIAGNOSIS — Y9389 Activity, other specified: Secondary | ICD-10-CM | POA: Insufficient documentation

## 2013-09-10 DIAGNOSIS — Z8719 Personal history of other diseases of the digestive system: Secondary | ICD-10-CM | POA: Diagnosis not present

## 2013-09-10 DIAGNOSIS — Z87820 Personal history of traumatic brain injury: Secondary | ICD-10-CM | POA: Insufficient documentation

## 2013-09-10 DIAGNOSIS — R51 Headache: Secondary | ICD-10-CM | POA: Diagnosis not present

## 2013-09-10 DIAGNOSIS — Z87891 Personal history of nicotine dependence: Secondary | ICD-10-CM | POA: Diagnosis not present

## 2013-09-10 DIAGNOSIS — Z0389 Encounter for observation for other suspected diseases and conditions ruled out: Secondary | ICD-10-CM | POA: Diagnosis not present

## 2013-09-10 DIAGNOSIS — I6789 Other cerebrovascular disease: Secondary | ICD-10-CM | POA: Diagnosis not present

## 2013-09-10 DIAGNOSIS — Z8639 Personal history of other endocrine, nutritional and metabolic disease: Secondary | ICD-10-CM | POA: Insufficient documentation

## 2013-09-10 HISTORY — DX: Pseudobulbar affect: F48.2

## 2013-09-10 HISTORY — DX: Pain in unspecified shoulder: M25.519

## 2013-09-10 HISTORY — DX: Other chronic pain: G89.29

## 2013-09-10 LAB — CBC WITH DIFFERENTIAL/PLATELET
Basophils Absolute: 0 10*3/uL (ref 0.0–0.1)
Basophils Relative: 1 % (ref 0–1)
Eosinophils Absolute: 0.1 10*3/uL (ref 0.0–0.7)
Eosinophils Relative: 2 % (ref 0–5)
HCT: 45.5 % (ref 39.0–52.0)
Hemoglobin: 16.1 g/dL (ref 13.0–17.0)
Lymphocytes Relative: 20 % (ref 12–46)
Lymphs Abs: 1.3 10*3/uL (ref 0.7–4.0)
MCH: 33.7 pg (ref 26.0–34.0)
MCHC: 35.4 g/dL (ref 30.0–36.0)
MCV: 95.2 fL (ref 78.0–100.0)
Monocytes Absolute: 0.5 10*3/uL (ref 0.1–1.0)
Monocytes Relative: 7 % (ref 3–12)
Neutro Abs: 4.6 10*3/uL (ref 1.7–7.7)
Neutrophils Relative %: 70 % (ref 43–77)
Platelets: 187 10*3/uL (ref 150–400)
RBC: 4.78 MIL/uL (ref 4.22–5.81)
RDW: 12.1 % (ref 11.5–15.5)
WBC: 6.6 10*3/uL (ref 4.0–10.5)

## 2013-09-10 LAB — BASIC METABOLIC PANEL
Anion gap: 11 (ref 5–15)
BUN: 19 mg/dL (ref 6–23)
CO2: 25 mEq/L (ref 19–32)
Calcium: 9.1 mg/dL (ref 8.4–10.5)
Chloride: 103 mEq/L (ref 96–112)
Creatinine, Ser: 0.79 mg/dL (ref 0.50–1.35)
GFR calc Af Amer: >90 mL/min (ref >90)
GFR calc non Af Amer: >90 mL/min (ref >90)
Glucose, Bld: 124 mg/dL — ABNORMAL HIGH (ref 70–99)
Potassium: 4.3 mEq/L (ref 3.7–5.3)
Sodium: 139 mEq/L (ref 137–147)
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
## 2013-09-10 NOTE — ED Notes (Signed)
Stepped in to room to find out if the patient had any needs.  He requested that his IV be removed.  I informed him that I needed to receive an order and further instruction in order to remove it and I would return in a moment.  Stepped in to another room to assist Crystal (RN) with other needs and to ask protocol on removing the IV.  As I was leaving the room I overheard the patient tell his wife he would remove the IV himself.

## 2013-09-10 NOTE — ED Notes (Signed)
Patient was very upset about care and abusive to staff and EDP. Patient refused to sign AMA form and was discharged without signing out to keep patient calm, patient taken to car and asked if I would convey to the EDP he was sorry for his actions.

## 2013-09-10 NOTE — Discharge Instructions (Signed)
°Emergency Department Resource Guide °1) Find a Doctor and Pay Out of Pocket °Although you won't have to find out who is covered by your insurance plan, it is a good idea to ask around and get recommendations. You will then need to call the office and see if the doctor you have chosen will accept you as a new patient and what types of options they offer for patients who are self-pay. Some doctors offer discounts or will set up payment plans for their patients who do not have insurance, but you will need to ask so you aren't surprised when you get to your appointment. ° °2) Contact Your Local Health Department °Not all health departments have doctors that can see patients for sick visits, but many do, so it is worth a call to see if yours does. If you don't know where your local health department is, you can check in your phone book. The CDC also has a tool to help you locate your state's health department, and many state websites also have listings of all of their local health departments. ° °3) Find a Walk-in Clinic °If your illness is not likely to be very severe or complicated, you may want to try a walk in clinic. These are popping up all over the country in pharmacies, drugstores, and shopping centers. They're usually staffed by nurse practitioners or physician assistants that have been trained to treat common illnesses and complaints. They're usually fairly quick and inexpensive. However, if you have serious medical issues or chronic medical problems, these are probably not your best option. ° °No Primary Care Doctor: °- Call [facility] at  832-8000 - they can help you locate a primary care doctor that  accepts your insurance, provides certain services, etc. °- Physician Referral Service- 1-800-533-3463 ° °Chronic Pain Problems: °Organization         Address  Phone   Notes  °[facility]  (336) 297-2271 Patients need to be referred by their primary care doctor.  ° °Medication  Assistance: °Organization         Address  Phone   Notes  °[place] [facility] [address] °[place], [place] [address] (336) 641-8030 --Must be a resident of [place] °-- Must have NO insurance coverage whatsoever (no Medicaid/ Medicare, etc.) °-- The pt. MUST have a primary care doctor that directs their care regularly and follows them in the community °  °[facility]  (866) 331-1348   °[employer]  (888) 892-1162   ° °Agencies that provide inexpensive medical care: °Organization         Address  Phone   Notes  °[facility]  (336) 832-8035   °[facility]    (336) 832-7272   °[facility] [address] °[place], [place] [address] (336) 832-4777   °[facility] [address], °[place] (336) 271-4999   °[facility]    (336) 373-0678   °[facility]    (336) 272-1050   °[facility] ° [address], [place] Phone:  (336) 832-4444, Fax:  (336) 832-4440 Hours of Operation:  9 am - 6 pm, M-F.  Also accepts Medicaid/Medicare and self-pay.  °[facility] ° [address], [place] Phone: (336) 832-3150, Fax: (336) 832-3151. Hours of Operation:  8:30 am - 5:30 pm, M-F.  Also accepts Medicaid and self-pay.  °[facility] [place] [address], [place] Phone: (336) 878-6027   °[facility] [address], [place], [place] (336)723-1848, Ext. 123 Mondays & Thursdays: 7-9 AM.  First 15 patients are seen on a first come, first serve basis. °  ° °Medicaid-accepting [place] Providers: ° °Organization         Address  Phone   Notes  °[facility] [address], [place] (336) 641-2100 Also accepts self-pay patients.  °[facility] [address], [place] ° (336) 856-9996   °[facility] [address], [place]  (336) 288-8857   °[facility] [address] [place] [address], [place] (336) 299-7000   °Veita Bland [address], [place]  ° (336) 373-1557 Only accepts [place] Access Medicaid patients after they have their name applied to their card.  ° °Self-Pay (no insurance) in [place]: ° °Organization         Address  Phone   Notes  °Sickle Cell Patients, [facility] [address], [place] (336) 832-1970   °[facility] [address], [place] (336) 832-4400   °[facility] ° [address], [place] (336) 992-4800   °[facility]/Dr. Osei-Bonsu ° [address] [place] [address], [place] or [address], [place] (336) 841-8500 Phone number for both [place] and [place] locations is the same.  °[facility] [address], [place] (336) 299-0000   °[facility] [address] [place] [address], [place] or [address] (336) 852-7530 °(336) 878-2260   °[facility] [address], [place] (336) 350-1642, phone; (336) 294-5005, fax Sees patients 1st and 3rd Saturday of every month.  Must not qualify for public or private insurance (i.e. Medicaid, Medicare, [place] Health Choice, Veterans' Benefits) • Household income should be no more than 200% of the poverty level •The clinic cannot treat you if you are pregnant or think you are pregnant • Sexually transmitted diseases are not treated at the clinic.  ° ° °Dental Care: °Organization         Address  Phone  Notes  °[place] [facility] [address], [place] (336) 641-6152 Accepts children up to age 21 who are enrolled in Medicaid or [place] Health Choice; pregnant women with a Medicaid card; and children who have applied for Medicaid or [place] Health Choice, but were declined, whose parents can pay a reduced fee at time of service.  °[place]  [facility] [place]  [address], [place] (336) 641-7733 Accepts children up to age 21 who are enrolled in Medicaid or [place] Health Choice; pregnant women with a Medicaid card; and children who have applied for Medicaid or [place] Health Choice, but were declined, whose parents can pay a reduced fee at time of service.  °[facility] ° [address], [place] (336) 641-4533 Patients are seen by appointment only. Walk-ins are not accepted. [facility] will see patients 18 years of age and older. °Monday - Tuesday (8am-5pm) °Most Wednesdays (8:30-5pm) °$30 per visit, cash only  °[facility] ° [address], [place] (336) 641-4533 Patients are seen by appointment only. Walk-ins are not accepted. [facility] will see patients 18 years of age and older. °One   Wednesday Evening (Monthly: Volunteer Based).  $30 per visit, cash only  °[facility]  (919) 537-3737 for adults; Children under age 4, call Graduate Pediatric Dentistry at (919) [phone]. Children aged 4-14, please call (919) [phone] to request a pediatric application. ° Dental services are provided in all areas of dental care including fillings, crowns and bridges, complete and partial dentures, implants, gum treatment, root canals, and extractions. Preventive care is also provided. Treatment is provided to both adults and children. °Patients are selected via a lottery and there is often a waiting list. °  °[facility] [address], °[place] ° (336) 763-8833 [email or web address] °  °[facility] [address], [place], [place] (336)723-1848, Ext. 123 Second and Fourth Thursday of each month, opens at 6:30 AM; Clinic ends at 9 AM.  Patients are seen on a first-come first-served basis, and a limited number are seen during each clinic.  ° °[facility] ° [address], [place], [place] (336) 723-7904    Eligibility Requirements °You must have lived in [place], [place], or [place] counties for at least the last three months. °  You cannot be eligible for state or federal sponsored healthcare insurance, including Veterans Administration, Medicaid, or Medicare. °  You generally cannot be eligible for healthcare insurance through your employer.  °  How to apply: °Eligibility screenings are held every Tuesday and Wednesday afternoon from 1:00 pm until 4:00 pm. You do not need an appointment for the interview!  °[facility] [address], [place], [place] 336-631-2330   °[facility]  336-342-8273   °[facility]  336-703-3100   °[facility]  336-570-6415   ° °Behavioral Health Resources in the Community: °Intensive Outpatient Programs °Organization         Address  Phone  Notes  °[facility] [address], [place], [place] 336-878-6098   °[facility] [address], [place], [place] 336-832-9800   °[facility] [address], [place], [place] ° 336-882-2125   °[facility] [address],  °[place], [place] 1-800-853-5163 or 336-641-4981   °Substance Abuse Resources °Organization         Address  Phone  Notes  °[facility]  336-882-2125   °[facility]  336-784-9470   °[facility]  336-285-9073   °[facility]  336-845-3988   °Residential & Outpatient Substance Abuse Program  1-800-659-3381   °Psychological Services °Organization         Address  Phone  Notes  ° Health  336- 832-9600   °[facility]  336- 378-7881   °[facility] [address], [place] 1-800-853-5163 or 336-641-4981   ° °Mobile Crisis Teams °Organization         Address  Phone  Notes  °[facility], Mobile Crisis Care Unit  1-877-626-1772   °Assertive °[facility] ° [address]  [place], [place] 336-834-9664   °Sharon DeEsch [address] °[facility] 336-554-5454   ° °Self-Help/Support Groups °Organization         Address  Phone             Notes  °[facility]  - variety of support groups  336- 373-1402 Call for more information  °Narcotics Anonymous (NA), [facility] [address], °[place]  2 meetings at this location  ° °  Residential Treatment Programs °Organization         Address  Phone  Notes  °[facility] [address],    °[place]  1-866-801-8205   °[facility] ° [address], [place], [place] 704-293-8524   °[facility] [address], [place] 336-845-3988 Admissions: 8am-3pm M-F  °[facility] [address]    °[place], [place] 336-841-1104   °[facility] [address], [place], [place] 336-379-7146   °[facility] [address]  °[place], [place] 336-285-9073   °[facility] Intensive Outpatient [address], [place], [place] 336-852-3033   °[facility] [address]  °[place], [place] 1-877-615-2722 or 336-784-9470   °[facility] [address] [place], [place] 336-227-7417 Accepts Medicaid  °[facility] [address]  ° [place] 1-800-659-3381 Substance Abuse/Addiction Treatment  ° °[place] Behavioral Health Resources °Organization         Address  Phone  Notes  °[facility]  (888) 581-9988   °Julie Brannon, PhD [address] [place], [place]   (336) 349-5553 or (336) 951-0000   °[facility]   [address] °[place], [place] (336) 349-4454   °[facility] [address], [place], [place] (336) 342-8316 Insurance/Medicaid/sponsorship through [facility]  °[facility] [address]                                    [place], [place] (336) 342-8316 Therapy/tele-psych/case    °[facility] [address]  ° [place], [place] (336) 349-2233    °Dr. Arfeen  (336) 349-4544   °[facility] [place]  [employer] [place] [facility] 1) [address], [place] °2) [address], [place] °3)  [address], [place] (336) 349-3220 °(336) 342-7768 ° °(336) 342-8140   °[place] [facility] (336) 342-1394 or (336) 342-3537 (After Hours)    ° ° °Take your usual prescriptions as previously directed.  Call your regular medical doctor on Monday to schedule a follow up appointment within the next 2 days.  Return to the Emergency Department immediately sooner if worsening.  ° °

## 2013-09-10 NOTE — ED Notes (Signed)
Patient ready to go home but refusing to sign out AMA. Patient refusing troponin and chest x-ray. Per patient doctor did not inform him that she was going to order theses test and he felt that the test were irrelevant to his symptoms. Patient states "My heart is fine. I just got cleared by the cardiologist, who told me I had the heart of a 62 year old and I have just had blood work at Dr Luan Pulling and he told me everything looked fine."  Patient states that he would have MRI if she ordered it. Dr Thurnell Garbe made aware, going to room to talk to patient.

## 2013-09-10 NOTE — ED Notes (Signed)
Pt states he woke up at 0600 with headache to right side along with nausea and sensitivity to light. Pt states he layed back down and when he got back up at 1030, he noticed increased weakness on his left side and trouble keeping left eye open, along with slurred speech. Pt states he believes he may have hit his head on the dresser at the time he fell.

## 2013-09-14 ENCOUNTER — Ambulatory Visit (HOSPITAL_COMMUNITY)
Admission: RE | Admit: 2013-09-14 | Discharge: 2013-09-14 | Disposition: A | Discharge status: Home / Self Care | Payer: Medicare Other | Source: Ambulatory Visit | Attending: Pulmonary Disease | Admitting: Pulmonary Disease

## 2013-09-14 ENCOUNTER — Other Ambulatory Visit (HOSPITAL_COMMUNITY): Payer: Self-pay | Admitting: Pulmonary Disease

## 2013-09-14 DIAGNOSIS — I6789 Other cerebrovascular disease: Secondary | ICD-10-CM | POA: Diagnosis not present

## 2013-09-14 DIAGNOSIS — F411 Generalized anxiety disorder: Secondary | ICD-10-CM | POA: Diagnosis not present

## 2013-09-14 DIAGNOSIS — R29818 Other symptoms and signs involving the nervous system: Secondary | ICD-10-CM | POA: Diagnosis not present

## 2013-09-14 DIAGNOSIS — I6350 Cerebral infarction due to unspecified occlusion or stenosis of unspecified cerebral artery: Secondary | ICD-10-CM | POA: Insufficient documentation

## 2013-09-14 DIAGNOSIS — I639 Cerebral infarction, unspecified: Secondary | ICD-10-CM

## 2013-10-31 DIAGNOSIS — F4310 Post-traumatic stress disorder, unspecified: Secondary | ICD-10-CM | POA: Diagnosis not present

## 2013-10-31 DIAGNOSIS — F3289 Other specified depressive episodes: Secondary | ICD-10-CM | POA: Diagnosis not present

## 2013-10-31 DIAGNOSIS — F411 Generalized anxiety disorder: Secondary | ICD-10-CM | POA: Diagnosis not present

## 2013-10-31 DIAGNOSIS — G459 Transient cerebral ischemic attack, unspecified: Secondary | ICD-10-CM | POA: Diagnosis not present

## 2013-10-31 DIAGNOSIS — F329 Major depressive disorder, single episode, unspecified: Secondary | ICD-10-CM | POA: Diagnosis not present

## 2013-10-31 DIAGNOSIS — Z23 Encounter for immunization: Secondary | ICD-10-CM | POA: Diagnosis not present

## 2013-11-07 DIAGNOSIS — F330 Major depressive disorder, recurrent, mild: Secondary | ICD-10-CM | POA: Diagnosis not present

## 2013-12-02 ENCOUNTER — Other Ambulatory Visit: Payer: Self-pay

## 2013-12-02 DIAGNOSIS — H25813 Combined forms of age-related cataract, bilateral: Secondary | ICD-10-CM | POA: Diagnosis not present

## 2013-12-02 DIAGNOSIS — H40013 Open angle with borderline findings, low risk, bilateral: Secondary | ICD-10-CM | POA: Diagnosis not present

## 2013-12-02 DIAGNOSIS — H43813 Vitreous degeneration, bilateral: Secondary | ICD-10-CM | POA: Diagnosis not present

## 2013-12-08 DIAGNOSIS — L723 Sebaceous cyst: Secondary | ICD-10-CM | POA: Diagnosis not present

## 2014-01-03 ENCOUNTER — Other Ambulatory Visit (HOSPITAL_COMMUNITY): Payer: Self-pay | Admitting: Pulmonary Disease

## 2014-01-03 DIAGNOSIS — E041 Nontoxic single thyroid nodule: Secondary | ICD-10-CM

## 2014-01-03 DIAGNOSIS — I6781 Acute cerebrovascular insufficiency: Secondary | ICD-10-CM | POA: Diagnosis not present

## 2014-01-03 DIAGNOSIS — F482 Pseudobulbar affect: Secondary | ICD-10-CM | POA: Diagnosis not present

## 2014-01-03 DIAGNOSIS — F4312 Post-traumatic stress disorder, chronic: Secondary | ICD-10-CM | POA: Diagnosis not present

## 2014-01-03 DIAGNOSIS — F419 Anxiety disorder, unspecified: Secondary | ICD-10-CM | POA: Diagnosis not present

## 2014-01-05 ENCOUNTER — Ambulatory Visit (HOSPITAL_COMMUNITY)
Admission: RE | Admit: 2014-01-05 | Discharge: 2014-01-05 | Disposition: A | Discharge status: Home / Self Care | Payer: Medicare Other | Source: Ambulatory Visit | Attending: Pulmonary Disease | Admitting: Pulmonary Disease

## 2014-01-05 DIAGNOSIS — E041 Nontoxic single thyroid nodule: Secondary | ICD-10-CM | POA: Diagnosis not present

## 2014-01-05 DIAGNOSIS — F419 Anxiety disorder, unspecified: Secondary | ICD-10-CM | POA: Diagnosis not present

## 2014-01-05 DIAGNOSIS — F4312 Post-traumatic stress disorder, chronic: Secondary | ICD-10-CM | POA: Diagnosis not present

## 2014-01-05 DIAGNOSIS — E049 Nontoxic goiter, unspecified: Secondary | ICD-10-CM | POA: Diagnosis not present

## 2014-01-05 DIAGNOSIS — F482 Pseudobulbar affect: Secondary | ICD-10-CM | POA: Diagnosis not present

## 2014-02-14 ENCOUNTER — Encounter: Payer: Self-pay | Admitting: Internal Medicine

## 2014-03-06 DIAGNOSIS — F4312 Post-traumatic stress disorder, chronic: Secondary | ICD-10-CM | POA: Diagnosis not present

## 2014-03-06 DIAGNOSIS — I6781 Acute cerebrovascular insufficiency: Secondary | ICD-10-CM | POA: Diagnosis not present

## 2014-03-06 DIAGNOSIS — F482 Pseudobulbar affect: Secondary | ICD-10-CM | POA: Diagnosis not present

## 2014-03-06 DIAGNOSIS — F329 Major depressive disorder, single episode, unspecified: Secondary | ICD-10-CM | POA: Diagnosis not present

## 2014-06-05 DIAGNOSIS — F482 Pseudobulbar affect: Secondary | ICD-10-CM | POA: Diagnosis not present

## 2014-06-05 DIAGNOSIS — I639 Cerebral infarction, unspecified: Secondary | ICD-10-CM | POA: Diagnosis not present

## 2014-06-05 DIAGNOSIS — F4312 Post-traumatic stress disorder, chronic: Secondary | ICD-10-CM | POA: Diagnosis not present

## 2014-06-05 DIAGNOSIS — R079 Chest pain, unspecified: Secondary | ICD-10-CM | POA: Diagnosis not present

## 2014-06-05 DIAGNOSIS — G4730 Sleep apnea, unspecified: Secondary | ICD-10-CM | POA: Diagnosis not present

## 2014-08-14 ENCOUNTER — Other Ambulatory Visit: Payer: Self-pay

## 2014-08-24 ENCOUNTER — Other Ambulatory Visit (HOSPITAL_COMMUNITY): Payer: Self-pay | Admitting: Pulmonary Disease

## 2014-08-24 DIAGNOSIS — F4312 Post-traumatic stress disorder, chronic: Secondary | ICD-10-CM | POA: Diagnosis not present

## 2014-08-24 DIAGNOSIS — F482 Pseudobulbar affect: Secondary | ICD-10-CM | POA: Diagnosis not present

## 2014-08-24 DIAGNOSIS — G459 Transient cerebral ischemic attack, unspecified: Secondary | ICD-10-CM | POA: Diagnosis not present

## 2014-08-24 DIAGNOSIS — F09 Unspecified mental disorder due to known physiological condition: Secondary | ICD-10-CM | POA: Diagnosis not present

## 2014-08-30 DIAGNOSIS — Z125 Encounter for screening for malignant neoplasm of prostate: Secondary | ICD-10-CM | POA: Diagnosis not present

## 2014-08-30 DIAGNOSIS — R431 Parosmia: Secondary | ICD-10-CM | POA: Diagnosis not present

## 2014-08-30 DIAGNOSIS — R4182 Altered mental status, unspecified: Secondary | ICD-10-CM | POA: Diagnosis not present

## 2014-08-30 DIAGNOSIS — G459 Transient cerebral ischemic attack, unspecified: Secondary | ICD-10-CM | POA: Diagnosis not present

## 2014-08-30 DIAGNOSIS — F4312 Post-traumatic stress disorder, chronic: Secondary | ICD-10-CM | POA: Diagnosis not present

## 2014-08-30 DIAGNOSIS — F482 Pseudobulbar affect: Secondary | ICD-10-CM | POA: Diagnosis not present

## 2014-08-30 DIAGNOSIS — F09 Unspecified mental disorder due to known physiological condition: Secondary | ICD-10-CM | POA: Diagnosis not present

## 2014-09-06 ENCOUNTER — Ambulatory Visit (HOSPITAL_COMMUNITY)
Admission: RE | Admit: 2014-09-06 | Discharge: 2014-09-06 | Disposition: A | Discharge status: Home / Self Care | Payer: Medicare Other | Source: Ambulatory Visit | Attending: Pulmonary Disease | Admitting: Pulmonary Disease

## 2014-09-06 DIAGNOSIS — Z8673 Personal history of transient ischemic attack (TIA), and cerebral infarction without residual deficits: Secondary | ICD-10-CM | POA: Insufficient documentation

## 2014-09-06 DIAGNOSIS — R413 Other amnesia: Secondary | ICD-10-CM | POA: Insufficient documentation

## 2014-09-06 DIAGNOSIS — R531 Weakness: Secondary | ICD-10-CM | POA: Diagnosis not present

## 2014-09-06 DIAGNOSIS — G459 Transient cerebral ischemic attack, unspecified: Secondary | ICD-10-CM

## 2014-10-09 DIAGNOSIS — F482 Pseudobulbar affect: Secondary | ICD-10-CM | POA: Diagnosis not present

## 2014-10-09 DIAGNOSIS — F4312 Post-traumatic stress disorder, chronic: Secondary | ICD-10-CM | POA: Diagnosis not present

## 2014-10-09 DIAGNOSIS — I6781 Acute cerebrovascular insufficiency: Secondary | ICD-10-CM | POA: Diagnosis not present

## 2014-10-09 DIAGNOSIS — F09 Unspecified mental disorder due to known physiological condition: Secondary | ICD-10-CM | POA: Diagnosis not present

## 2014-11-29 DIAGNOSIS — F4310 Post-traumatic stress disorder, unspecified: Secondary | ICD-10-CM | POA: Diagnosis not present

## 2014-11-30 ENCOUNTER — Telehealth: Payer: Self-pay | Admitting: Gastroenterology

## 2014-11-30 NOTE — Telephone Encounter (Signed)
A user error has taken place.

## 2014-12-11 ENCOUNTER — Other Ambulatory Visit (HOSPITAL_COMMUNITY): Payer: Self-pay | Admitting: Pulmonary Disease

## 2014-12-11 DIAGNOSIS — Z23 Encounter for immunization: Secondary | ICD-10-CM | POA: Diagnosis not present

## 2014-12-11 DIAGNOSIS — I6781 Acute cerebrovascular insufficiency: Secondary | ICD-10-CM | POA: Diagnosis not present

## 2014-12-11 DIAGNOSIS — F482 Pseudobulbar affect: Secondary | ICD-10-CM | POA: Diagnosis not present

## 2014-12-11 DIAGNOSIS — F4312 Post-traumatic stress disorder, chronic: Secondary | ICD-10-CM | POA: Diagnosis not present

## 2014-12-11 DIAGNOSIS — G8929 Other chronic pain: Secondary | ICD-10-CM | POA: Diagnosis not present

## 2014-12-11 DIAGNOSIS — E041 Nontoxic single thyroid nodule: Secondary | ICD-10-CM

## 2014-12-14 ENCOUNTER — Ambulatory Visit (HOSPITAL_COMMUNITY)
Admission: RE | Admit: 2014-12-14 | Discharge: 2014-12-14 | Disposition: A | Discharge status: Home / Self Care | Payer: Medicare Other | Source: Ambulatory Visit | Attending: Pulmonary Disease | Admitting: Pulmonary Disease

## 2014-12-14 DIAGNOSIS — E042 Nontoxic multinodular goiter: Secondary | ICD-10-CM | POA: Diagnosis not present

## 2014-12-14 DIAGNOSIS — G459 Transient cerebral ischemic attack, unspecified: Secondary | ICD-10-CM | POA: Diagnosis not present

## 2014-12-14 DIAGNOSIS — G441 Vascular headache, not elsewhere classified: Secondary | ICD-10-CM | POA: Diagnosis not present

## 2014-12-14 DIAGNOSIS — E041 Nontoxic single thyroid nodule: Secondary | ICD-10-CM

## 2014-12-17 ENCOUNTER — Observation Stay (HOSPITAL_COMMUNITY)
Admission: EM | Admit: 2014-12-17 | Discharge: 2014-12-20 | Disposition: A | Discharge status: Home / Self Care | Payer: Medicare Other | Attending: Pulmonary Disease | Admitting: Pulmonary Disease

## 2014-12-17 ENCOUNTER — Emergency Department (HOSPITAL_COMMUNITY): Payer: Medicare Other

## 2014-12-17 ENCOUNTER — Encounter (HOSPITAL_COMMUNITY): Payer: Self-pay | Admitting: Emergency Medicine

## 2014-12-17 DIAGNOSIS — G4730 Sleep apnea, unspecified: Secondary | ICD-10-CM | POA: Insufficient documentation

## 2014-12-17 DIAGNOSIS — Z79899 Other long term (current) drug therapy: Secondary | ICD-10-CM | POA: Diagnosis not present

## 2014-12-17 DIAGNOSIS — F482 Pseudobulbar affect: Secondary | ICD-10-CM | POA: Insufficient documentation

## 2014-12-17 DIAGNOSIS — M1990 Unspecified osteoarthritis, unspecified site: Secondary | ICD-10-CM | POA: Insufficient documentation

## 2014-12-17 DIAGNOSIS — R001 Bradycardia, unspecified: Secondary | ICD-10-CM | POA: Insufficient documentation

## 2014-12-17 DIAGNOSIS — Z8701 Personal history of pneumonia (recurrent): Secondary | ICD-10-CM | POA: Insufficient documentation

## 2014-12-17 DIAGNOSIS — R4182 Altered mental status, unspecified: Secondary | ICD-10-CM | POA: Diagnosis present

## 2014-12-17 DIAGNOSIS — K922 Gastrointestinal hemorrhage, unspecified: Secondary | ICD-10-CM | POA: Insufficient documentation

## 2014-12-17 DIAGNOSIS — Z7982 Long term (current) use of aspirin: Secondary | ICD-10-CM | POA: Insufficient documentation

## 2014-12-17 DIAGNOSIS — F419 Anxiety disorder, unspecified: Secondary | ICD-10-CM | POA: Diagnosis not present

## 2014-12-17 DIAGNOSIS — Z9981 Dependence on supplemental oxygen: Secondary | ICD-10-CM | POA: Insufficient documentation

## 2014-12-17 DIAGNOSIS — F4310 Post-traumatic stress disorder, unspecified: Secondary | ICD-10-CM | POA: Diagnosis not present

## 2014-12-17 DIAGNOSIS — Z87891 Personal history of nicotine dependence: Secondary | ICD-10-CM | POA: Diagnosis not present

## 2014-12-17 DIAGNOSIS — E041 Nontoxic single thyroid nodule: Secondary | ICD-10-CM | POA: Insufficient documentation

## 2014-12-17 DIAGNOSIS — F411 Generalized anxiety disorder: Secondary | ICD-10-CM | POA: Diagnosis not present

## 2014-12-17 DIAGNOSIS — F329 Major depressive disorder, single episode, unspecified: Secondary | ICD-10-CM | POA: Insufficient documentation

## 2014-12-17 DIAGNOSIS — R531 Weakness: Secondary | ICD-10-CM | POA: Diagnosis not present

## 2014-12-17 DIAGNOSIS — I6789 Other cerebrovascular disease: Secondary | ICD-10-CM

## 2014-12-17 DIAGNOSIS — R29898 Other symptoms and signs involving the musculoskeletal system: Secondary | ICD-10-CM

## 2014-12-17 DIAGNOSIS — G8929 Other chronic pain: Secondary | ICD-10-CM | POA: Diagnosis not present

## 2014-12-17 DIAGNOSIS — Q211 Atrial septal defect: Secondary | ICD-10-CM | POA: Diagnosis not present

## 2014-12-17 DIAGNOSIS — I63019 Cerebral infarction due to thrombosis of unspecified vertebral artery: Secondary | ICD-10-CM

## 2014-12-17 DIAGNOSIS — Z8673 Personal history of transient ischemic attack (TIA), and cerebral infarction without residual deficits: Secondary | ICD-10-CM | POA: Diagnosis not present

## 2014-12-17 DIAGNOSIS — I639 Cerebral infarction, unspecified: Secondary | ICD-10-CM | POA: Diagnosis present

## 2014-12-17 DIAGNOSIS — I10 Essential (primary) hypertension: Secondary | ICD-10-CM | POA: Diagnosis present

## 2014-12-17 DIAGNOSIS — I509 Heart failure, unspecified: Secondary | ICD-10-CM | POA: Diagnosis not present

## 2014-12-17 DIAGNOSIS — Q2111 Secundum atrial septal defect: Secondary | ICD-10-CM

## 2014-12-17 LAB — DIFFERENTIAL
Basophils Absolute: 0 10*3/uL (ref 0.0–0.1)
Basophils Relative: 0 %
Eosinophils Absolute: 0.2 10*3/uL (ref 0.0–0.7)
Eosinophils Relative: 2 %
Lymphocytes Relative: 25 %
Lymphs Abs: 2 10*3/uL (ref 0.7–4.0)
Monocytes Absolute: 0.7 10*3/uL (ref 0.1–1.0)
Monocytes Relative: 8 %
Neutro Abs: 5.3 10*3/uL (ref 1.7–7.7)
Neutrophils Relative %: 64 %

## 2014-12-17 LAB — COMPREHENSIVE METABOLIC PANEL
ALT: 26 U/L (ref 17–63)
AST: 22 U/L (ref 15–41)
Albumin: 4.5 g/dL (ref 3.5–5.0)
Alkaline Phosphatase: 66 U/L (ref 38–126)
Anion gap: 8 (ref 5–15)
BUN: 17 mg/dL (ref 6–20)
CO2: 25 mmol/L (ref 22–32)
Calcium: 9.6 mg/dL (ref 8.9–10.3)
Chloride: 105 mmol/L (ref 101–111)
Creatinine, Ser: 0.73 mg/dL (ref 0.61–1.24)
GFR calc Af Amer: >60 mL/min (ref >60)
GFR calc non Af Amer: >60 mL/min (ref >60)
Glucose, Bld: 108 mg/dL — ABNORMAL HIGH (ref 65–99)
Potassium: 3.8 mmol/L (ref 3.5–5.1)
Sodium: 138 mmol/L (ref 135–145)
Total Bilirubin: 1.3 mg/dL — ABNORMAL HIGH (ref 0.3–1.2)
Total Protein: 7.3 g/dL (ref 6.5–8.1)

## 2014-12-17 LAB — I-STAT CHEM 8, ED
BUN: 23 mg/dL — ABNORMAL HIGH (ref 6–20)
Calcium, Ion: 1.1 mmol/L — ABNORMAL LOW (ref 1.13–1.30)
Chloride: 103 mmol/L (ref 101–111)
Creatinine, Ser: 0.9 mg/dL (ref 0.61–1.24)
Glucose, Bld: 110 mg/dL — ABNORMAL HIGH (ref 65–99)
HCT: 49 % (ref 39.0–52.0)
Hemoglobin: 16.7 g/dL (ref 13.0–17.0)
Potassium: 6.6 mmol/L (ref 3.5–5.1)
Sodium: 136 mmol/L (ref 135–145)
TCO2: 26 mmol/L (ref 0–100)

## 2014-12-17 LAB — CBC
HCT: 46.1 % (ref 39.0–52.0)
Hemoglobin: 16.3 g/dL (ref 13.0–17.0)
MCH: 33.2 pg (ref 26.0–34.0)
MCHC: 35.4 g/dL (ref 30.0–36.0)
MCV: 93.9 fL (ref 78.0–100.0)
Platelets: 218 10*3/uL (ref 150–400)
RBC: 4.91 MIL/uL (ref 4.22–5.81)
RDW: 11.9 % (ref 11.5–15.5)
WBC: 8.2 10*3/uL (ref 4.0–10.5)

## 2014-12-17 LAB — CBG MONITORING, ED
Glucose-Capillary: 104 mg/dL — ABNORMAL HIGH (ref 65–99)
Glucose-Capillary: 111 mg/dL — ABNORMAL HIGH (ref 65–99)

## 2014-12-17 LAB — PROTIME-INR
INR: 1.08 (ref 0.00–1.49)
Prothrombin Time: 14.2 seconds (ref 11.6–15.2)

## 2014-12-17 LAB — I-STAT TROPONIN, ED: Troponin i, poc: 0 ng/mL (ref 0.00–0.08)

## 2014-12-17 LAB — APTT: aPTT: 26 seconds (ref 24–37)

## 2014-12-17 MED ORDER — VENLAFAXINE HCL ER 37.5 MG PO CP24
37.5000 mg | ORAL_CAPSULE | Freq: Every day | ORAL | Status: DC
  Administered 2014-12-18 – 2014-12-20 (×3): 37.5 mg via ORAL
  Filled 2014-12-17 (×7): qty 1

## 2014-12-17 MED ORDER — ALPRAZOLAM 0.5 MG PO TABS
0.5000 mg | ORAL_TABLET | Freq: Every day | ORAL | Status: DC | PRN
  Administered 2014-12-20: 0.5 mg via ORAL
  Filled 2014-12-17: qty 1

## 2014-12-17 MED ORDER — ENOXAPARIN SODIUM 40 MG/0.4ML ~~LOC~~ SOLN
40.0000 mg | SUBCUTANEOUS | Status: DC
  Administered 2014-12-17 – 2014-12-19 (×3): 40 mg via SUBCUTANEOUS
  Filled 2014-12-17 (×3): qty 0.4

## 2014-12-17 MED ORDER — CLOPIDOGREL BISULFATE 75 MG PO TABS
75.0000 mg | ORAL_TABLET | Freq: Every day | ORAL | Status: DC
  Administered 2014-12-18 – 2014-12-20 (×3): 75 mg via ORAL
  Filled 2014-12-17 (×3): qty 1

## 2014-12-17 MED ORDER — VITAMIN B-1 100 MG PO TABS
100.0000 mg | ORAL_TABLET | Freq: Every day | ORAL | Status: DC
  Administered 2014-12-18 – 2014-12-20 (×3): 100 mg via ORAL
  Filled 2014-12-17 (×3): qty 1

## 2014-12-17 MED ORDER — ACETAMINOPHEN 500 MG PO TABS
1000.0000 mg | ORAL_TABLET | Freq: Four times a day (QID) | ORAL | Status: DC | PRN
  Administered 2014-12-19 – 2014-12-20 (×4): 1000 mg via ORAL
  Filled 2014-12-17 (×4): qty 2

## 2014-12-17 MED ORDER — OMEGA-3-ACID ETHYL ESTERS 1 G PO CAPS
1.0000 g | ORAL_CAPSULE | Freq: Two times a day (BID) | ORAL | Status: DC
  Administered 2014-12-18 – 2014-12-20 (×5): 1 g via ORAL
  Filled 2014-12-17 (×6): qty 1

## 2014-12-17 MED ORDER — CALCIUM CARBONATE-VITAMIN D 500-200 MG-UNIT PO TABS
2.0000 | ORAL_TABLET | Freq: Every day | ORAL | Status: DC
  Administered 2014-12-18 – 2014-12-20 (×3): 2 via ORAL
  Filled 2014-12-17: qty 2
  Filled 2014-12-17: qty 1
  Filled 2014-12-17: qty 2
  Filled 2014-12-17: qty 1

## 2014-12-17 MED ORDER — SENNOSIDES-DOCUSATE SODIUM 8.6-50 MG PO TABS: 1.0000 | ORAL_TABLET | Freq: Every evening | ORAL | Status: DC | PRN

## 2014-12-17 MED ORDER — STROKE: EARLY STAGES OF RECOVERY BOOK
Freq: Once | Status: AC
  Administered 2014-12-19: 11:00:00
  Filled 2014-12-17: qty 1

## 2014-12-17 MED ORDER — ASPIRIN EC 325 MG PO TBEC
325.0000 mg | DELAYED_RELEASE_TABLET | Freq: Every day | ORAL | Status: DC
  Administered 2014-12-18 – 2014-12-20 (×3): 325 mg via ORAL
  Filled 2014-12-17 (×3): qty 1

## 2014-12-17 NOTE — ED Notes (Signed)
Patient arrived via POV with complaints of left sided weakness, headache and "not feeling right". Patient has history of multiple CVAs and TIAs, patient has received TPA twice in the past. Patient does have residual left sided weakness from prior CVAs, but states this weakness is worse. Patient stated symptoms began at 1200, he laid down and took a nap hoping symptoms would subsided. At 1400 patient woke with still feeling poorly and was bought to ED by his wife.   Patient arrived at 56.Was taken straight back to ED room 12. Peripheral IV and labs were drawn, Dr. Elnora Morrison in to assess patient at 1620. Code Stroke called at 1625. Myself and Jerene Pitch, ED tech took patient to radiology. We arrived at CT at 1630, Ct was completed at 1633 and we were back in ED room 12 at 1635. Teleneuro was called and alerted at 1630. A second peripheral IV was started and a 12 lead obtained.CBG at 1654 111. At 1643 Teleneuro exam began; was completed at 1704. Labs were redrawn at 1705 due to previous labs becoming hemolysised. At completion of Teleneuro exam, it was decided that patient was not a TPA candidate due to not being sure if symptoms were from new stroke or worsening residual from old stroke. Neuroligst spoke with Dr. Reather Converse decision to admit patient here made and discussed with patient and his wife.

## 2014-12-17 NOTE — ED Notes (Signed)
MD at bedside. 
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
## 2014-12-17 NOTE — ED Notes (Signed)
Report given to [place] all questions answered.

## 2014-12-17 NOTE — ED Notes (Signed)
PT last known well at 1200 today. At 1400 today he notified family of headache to right frontal lobe and left sided weakness and decreased vision to left eye. Hx of stroke per pt and family.

## 2014-12-17 NOTE — Progress Notes (Signed)
CODE STROKE  Called at 16:25, 16:31 beeper code stroke, 1635 pt on table, 16:39 pt returning to ED, 16:41 call to [facility] Radiology talked to Rhonda./bbj

## 2014-12-17 NOTE — ED Notes (Signed)
PT went to CT at this time with Nurse [facility]).

## 2014-12-17 NOTE — ED Notes (Signed)
edp aware that blood was hemolyzed.  Instructed to d/c istats and have lab redraw for other labwork.

## 2014-12-17 NOTE — ED Notes (Signed)
CRITICAL VALUE ALERT  Critical value received:  potssium 6.6 on ISTAT  Date of notification:  12/17/2014  Time of notification:  1640  Critical value read back:Yes.    Nurse who received alert:  [facility] RN   MD notified (1st page):  Dr. Reather Converse  Time of first page:  1640  MD notified (2nd page):  Time of second page:   Responding MD:  Dr. Reather Converse  Time MD responded:  1640- will compare to the lab's potssium.

## 2014-12-17 NOTE — ED Notes (Signed)
Per lab, Blood drawn initially was hemolyzed.  Lab will redraw for blood work.
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
## 2014-12-18 ENCOUNTER — Observation Stay (HOSPITAL_COMMUNITY): Payer: Medicare Other

## 2014-12-18 ENCOUNTER — Observation Stay (HOSPITAL_BASED_OUTPATIENT_CLINIC_OR_DEPARTMENT_OTHER): Payer: Medicare Other

## 2014-12-18 DIAGNOSIS — F4312 Post-traumatic stress disorder, chronic: Secondary | ICD-10-CM | POA: Diagnosis not present

## 2014-12-18 DIAGNOSIS — I6789 Other cerebrovascular disease: Secondary | ICD-10-CM | POA: Diagnosis not present

## 2014-12-18 DIAGNOSIS — E785 Hyperlipidemia, unspecified: Secondary | ICD-10-CM | POA: Diagnosis not present

## 2014-12-18 DIAGNOSIS — R51 Headache: Secondary | ICD-10-CM | POA: Diagnosis not present

## 2014-12-18 DIAGNOSIS — R569 Unspecified convulsions: Secondary | ICD-10-CM | POA: Diagnosis not present

## 2014-12-18 DIAGNOSIS — R270 Ataxia, unspecified: Secondary | ICD-10-CM | POA: Diagnosis not present

## 2014-12-18 DIAGNOSIS — F329 Major depressive disorder, single episode, unspecified: Secondary | ICD-10-CM | POA: Diagnosis not present

## 2014-12-18 DIAGNOSIS — R531 Weakness: Secondary | ICD-10-CM | POA: Diagnosis not present

## 2014-12-18 DIAGNOSIS — G9389 Other specified disorders of brain: Secondary | ICD-10-CM | POA: Diagnosis not present

## 2014-12-18 DIAGNOSIS — R4182 Altered mental status, unspecified: Secondary | ICD-10-CM | POA: Diagnosis not present

## 2014-12-18 DIAGNOSIS — I6990 Unspecified sequelae of unspecified cerebrovascular disease: Secondary | ICD-10-CM | POA: Diagnosis not present

## 2014-12-18 LAB — LIPID PANEL
Cholesterol: 233 mg/dL — ABNORMAL HIGH (ref 0–200)
HDL: 43 mg/dL (ref >40)
LDL Cholesterol: 150 mg/dL — ABNORMAL HIGH (ref 0–99)
Total CHOL/HDL Ratio: 5.4 RATIO
Triglycerides: 202 mg/dL — ABNORMAL HIGH (ref <150)
VLDL: 40 mg/dL (ref 0–40)

## 2014-12-18 LAB — URINALYSIS, ROUTINE W REFLEX MICROSCOPIC
Bilirubin Urine: NEGATIVE
Glucose, UA: NEGATIVE mg/dL
Hgb urine dipstick: NEGATIVE
Leukocytes, UA: NEGATIVE
Nitrite: NEGATIVE
Protein, ur: NEGATIVE mg/dL
Specific Gravity, Urine: >1.03 — ABNORMAL HIGH (ref 1.005–1.030)
Urobilinogen, UA: 0.2 mg/dL (ref 0.0–1.0)
pH: 5 (ref 5.0–8.0)

## 2014-12-18 LAB — ANTITHROMBIN III: AntiThromb III Func: 106 % (ref 75–120)

## 2014-12-18 MED ORDER — GADOBENATE DIMEGLUMINE 529 MG/ML IV SOLN
20.0000 mL | Freq: Once | INTRAVENOUS | Status: AC | PRN
  Administered 2014-12-18: 20 mL via INTRAVENOUS

## 2014-12-18 NOTE — Progress Notes (Signed)
Subjective: He was admitted yesterday with what appears to be an acute stroke. He failed bedside swallowing evaluation but is now being seen by Dr. Merlene Laughter. He says he still has significant weakness on the left his speech appears to be at normal  Objective: Vital signs in last 24 hours: Temp:  [97.5 F (36.4 C)-98.3 F (36.8 C)] 97.5 F (36.4 C) (10/31 0615) Pulse Rate:  [49-64] 51 (10/31 0615) Resp:  [12-20] 17 (10/31 0615) BP: (117-166)/(78-128) 134/83 mmHg (10/31 0615) SpO2:  [95 %-99 %] 98 % (10/31 0615) Weight:  [104.327 kg (230 lb)-105.325 kg (232 lb 3.2 oz)] 105.325 kg (232 lb 3.2 oz) (10/30 2013) Weight change:  Last BM Date: 12/17/14  Intake/Output from previous day: 10/30 0701 - 10/31 0700 In: -  Out: 250 [Urine:250]  PHYSICAL EXAM General appearance: alert, cooperative and mild distress Resp: clear to auscultation bilaterally Cardio: regular rate and rhythm, S1, S2 normal, no murmur, click, rub or gallop GI: soft, non-tender; bowel sounds normal; no masses,  no organomegaly Extremities: extremities normal, atraumatic, no cyanosis or edema  Lab Results:  Results for orders placed or performed during the hospital encounter of 12/17/14 (from the past 48 hour(s))  CBG monitoring, ED     Status: Abnormal   Collection Time: 12/17/14  4:22 PM  Result Value Ref Range   Glucose-Capillary 104 (H) 65 - 99 mg/dL  Protime-INR     Status: None   Collection Time: 12/17/14  4:24 PM  Result Value Ref Range   Prothrombin Time 14.2 11.6 - 15.2 seconds   INR 1.08 0.00 - 1.49  APTT     Status: None   Collection Time: 12/17/14  4:24 PM  Result Value Ref Range   aPTT 26 24 - 37 seconds  CBC     Status: None   Collection Time: 12/17/14  4:24 PM  Result Value Ref Range   WBC 8.2 4.0 - 10.5 K/uL   RBC 4.91 4.22 - 5.81 MIL/uL   Hemoglobin 16.3 13.0 - 17.0 g/dL   HCT 46.1 39.0 - 52.0 %   MCV 93.9 78.0 - 100.0 fL   MCH 33.2 26.0 - 34.0 pg   MCHC 35.4 30.0 - 36.0 g/dL   RDW 11.9  11.5 - 15.5 %   Platelets 218 150 - 400 K/uL  Differential     Status: None   Collection Time: 12/17/14  4:24 PM  Result Value Ref Range   Neutrophils Relative % 64 %   Neutro Abs 5.3 1.7 - 7.7 K/uL   Lymphocytes Relative 25 %   Lymphs Abs 2.0 0.7 - 4.0 K/uL   Monocytes Relative 8 %   Monocytes Absolute 0.7 0.1 - 1.0 K/uL   Eosinophils Relative 2 %   Eosinophils Absolute 0.2 0.0 - 0.7 K/uL   Basophils Relative 0 %   Basophils Absolute 0.0 0.0 - 0.1 K/uL  I-Stat Chem 8, ED  (not at [facility], [facility])     Status: Abnormal   Collection Time: 12/17/14  4:33 PM  Result Value Ref Range   Sodium 136 135 - 145 mmol/L   Potassium 6.6 (HH) 3.5 - 5.1 mmol/L   Chloride 103 101 - 111 mmol/L   BUN 23 (H) 6 - 20 mg/dL   Creatinine, Ser 0.90 0.61 - 1.24 mg/dL   Glucose, Bld 110 (H) 65 - 99 mg/dL   Calcium, Ion 1.10 (L) 1.13 - 1.30 mmol/L   TCO2 26 0 - 100 mmol/L   Hemoglobin 16.7 13.0 -  17.0 g/dL   HCT 31.1 15.1 - 63.1 %   Comment NOTIFIED PHYSICIAN   I-stat troponin, ED (not at [facility], [facility])     Status: None   Collection Time: 12/17/14  4:36 PM  Result Value Ref Range   Troponin i, poc 0.00 0.00 - 0.08 ng/mL   Comment 3            Comment: Due to the release kinetics of cTnI, a negative result within the first hours of the onset of symptoms does not rule out myocardial infarction with certainty. If myocardial infarction is still suspected, repeat the test at appropriate intervals.   CBG monitoring, ED     Status: Abnormal   Collection Time: 12/17/14  4:54 PM  Result Value Ref Range   Glucose-Capillary 111 (H) 65 - 99 mg/dL  Comprehensive metabolic panel     Status: Abnormal   Collection Time: 12/17/14  5:02 PM  Result Value Ref Range   Sodium 138 135 - 145 mmol/L   Potassium 3.8 3.5 - 5.1 mmol/L    Comment: DELTA CHECK NOTED   Chloride 105 101 - 111 mmol/L   CO2 25 22 - 32 mmol/L   Glucose, Bld 108 (H) 65 - 99 mg/dL   BUN 17 6 - 20 mg/dL   Creatinine, Ser 1.89 0.61 - 1.24 mg/dL    Calcium 9.6 8.9 - 19.3 mg/dL   Total Protein 7.3 6.5 - 8.1 g/dL   Albumin 4.5 3.5 - 5.0 g/dL   AST 22 15 - 41 U/L   ALT 26 17 - 63 U/L   Alkaline Phosphatase 66 38 - 126 U/L   Total Bilirubin 1.3 (H) 0.3 - 1.2 mg/dL   GFR calc non Af Amer >60 >60 mL/min   GFR calc Af Amer >60 >60 mL/min    Comment: (NOTE) The eGFR has been calculated using the CKD EPI equation. This calculation has not been validated in all clinical situations. eGFR's persistently <60 mL/min signify possible Chronic Kidney Disease.    Anion gap 8 5 - 15  Lipid panel     Status: Abnormal   Collection Time: 12/18/14  7:06 AM  Result Value Ref Range   Cholesterol 233 (H) 0 - 200 mg/dL   Triglycerides 560 (H) <150 mg/dL   HDL 43 >90 mg/dL   Total CHOL/HDL Ratio 5.4 RATIO   VLDL 40 0 - 40 mg/dL   LDL Cholesterol 127 (H) 0 - 99 mg/dL    Comment:        Total Cholesterol/HDL:CHD Risk Coronary Heart Disease Risk Table                     Men   Women  1/2 Average Risk   3.4   3.3  Average Risk       5.0   4.4  2 X Average Risk   9.6   7.1  3 X Average Risk  23.4   11.0        Use the calculated Patient Ratio above and the CHD Risk Table to determine the patient's CHD Risk.        ATP III CLASSIFICATION (LDL):  <100     mg/dL   Optimal  980-077  mg/dL   Near or Above                    Optimal  130-159  mg/dL   Borderline  618-636  mg/dL   High  >350  mg/dL   Very High     ABGS  Recent Labs  12/17/14 1633  TCO2 26   CULTURES No results found for this or any previous visit (from the past 240 hour(s)). Studies/Results: Ct Head Wo Contrast  12/17/2014  CLINICAL DATA:  Worsening left side weakness EXAM: CT HEAD WITHOUT CONTRAST TECHNIQUE: Contiguous axial images were obtained from the base of the skull through the vertex without intravenous contrast. COMPARISON:  09/10/13 FINDINGS: Calvarium is intact. No significant inflammatory change in the visualized portions of the paranasal sinuses. Mild  age-related atrophy. No evidence of cortical infarct or mass. No hydrocephalus. No hemorrhage or extra-axial fluid. IMPRESSION: Negative head CT. Critical Value/emergent results were called by telephone at the time of interpretation on 12/17/2014 at 4:47 pm to Dr. Elnora Morrison , who verbally acknowledged these results. Electronically Signed   By: Skipper Cliche M.D.   On: 12/17/2014 16:47    Medications:  Prior to Admission:  Prescriptions prior to admission  Medication Sig Dispense Refill Last Dose  . acetaminophen (TYLENOL) 500 MG tablet Take 1,000 mg by mouth every 6 (six) hours as needed for mild pain or moderate pain. For pain   12/17/2014 at Unknown time  . ALPRAZolam (XANAX) 0.5 MG tablet Take 0.5 mg by mouth daily as needed for anxiety or sleep.    unknown  . aspirin 325 MG EC tablet Take 325 mg by mouth daily.   12/17/2014 at Unknown time  . calcium citrate-vitamin D (CITRACAL+D) 315-200 MG-UNIT per tablet Take 2 tablets by mouth daily.   12/17/2014 at Unknown time  . clopidogrel (PLAVIX) 75 MG tablet Take 75 mg by mouth daily.    12/17/2014 at Unknown time  . Cyanocobalamin (VITAMIN B 12 PO) Take 1 tablet by mouth daily.   12/17/2014 at Unknown time  . Multiple Vitamins-Minerals (CENTRUM SILVER ULTRA MENS PO) Take 1 tablet by mouth daily.   12/17/2014 at Unknown time  . Omega-3 Fatty Acids (FISH OIL) 1200 MG CAPS Take 1 capsule by mouth daily.    12/17/2014 at Unknown time  . thiamine (VITAMIN B-1) 100 MG tablet Take 100 mg by mouth daily.   12/17/2014 at Unknown time  . venlafaxine XR (EFFEXOR-XR) 37.5 MG 24 hr capsule Take 37.5 mg by mouth daily with breakfast.    12/17/2014 at Unknown time   Scheduled: .  stroke: mapping our early stages of recovery book   Does not apply Once  . aspirin  325 mg Oral Daily  . calcium-vitamin D  2 tablet Oral Daily  . clopidogrel  75 mg Oral Daily  . enoxaparin (LOVENOX) injection  40 mg Subcutaneous Q24H  . omega-3 acid ethyl esters  1 g Oral  BID  . thiamine  100 mg Oral Daily  . venlafaxine XR  37.5 mg Oral Q breakfast   Continuous:  SPQ:[id number], ALPRAZolam, senna-docusate  Assesment: He has acute stroke. He has had several episodes of neurological problems. His situation is complicated by posttraumatic stress disorder and anxiety Principal Problem:   Acute ischemic stroke [facility]) Active Problems:   PTSD   Essential hypertension   PATENT FORAMEN OVALE   Altered mental status   Anxiety state   CVA (cerebral infarction)    Plan: Neurology consultation is underway. Swallowing evaluation will be done. He had MRI which is pending    LOS: 1 day   Marsena Taff L 12/18/2014, 9:36 AM
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
## 2014-12-18 NOTE — Progress Notes (Signed)
ST screened for cognitive linguistic changes/needs. However pt and daughter deny changes from baseline functioning. Both confirm that neurologic symptoms once reported (slurred speech and altered mental status) have resolved. CT and MRI also negative for acute intracranial abnormalities.  No cognitive linguistic intervention indicated at this time.        Arvil Chaco MA, [place] Acute Care Speech Language Pathologist

## 2014-12-19 ENCOUNTER — Observation Stay (HOSPITAL_COMMUNITY)
Admit: 2014-12-19 | Discharge: 2014-12-19 | Disposition: A | Discharge status: Home / Self Care | Payer: Medicare Other | Attending: Neurology | Admitting: Neurology

## 2014-12-19 DIAGNOSIS — I6990 Unspecified sequelae of unspecified cerebrovascular disease: Secondary | ICD-10-CM | POA: Diagnosis not present

## 2014-12-19 DIAGNOSIS — F329 Major depressive disorder, single episode, unspecified: Secondary | ICD-10-CM | POA: Diagnosis not present

## 2014-12-19 DIAGNOSIS — E785 Hyperlipidemia, unspecified: Secondary | ICD-10-CM | POA: Diagnosis not present

## 2014-12-19 DIAGNOSIS — I6789 Other cerebrovascular disease: Secondary | ICD-10-CM | POA: Diagnosis not present

## 2014-12-19 DIAGNOSIS — R569 Unspecified convulsions: Secondary | ICD-10-CM | POA: Diagnosis not present

## 2014-12-19 DIAGNOSIS — R4182 Altered mental status, unspecified: Secondary | ICD-10-CM | POA: Diagnosis not present

## 2014-12-19 DIAGNOSIS — R51 Headache: Secondary | ICD-10-CM | POA: Diagnosis not present

## 2014-12-19 DIAGNOSIS — F4312 Post-traumatic stress disorder, chronic: Secondary | ICD-10-CM | POA: Diagnosis not present

## 2014-12-19 DIAGNOSIS — R270 Ataxia, unspecified: Secondary | ICD-10-CM | POA: Diagnosis not present

## 2014-12-19 LAB — HOMOCYSTEINE: Homocysteine: 6.4 umol/L (ref 0.0–15.0)

## 2014-12-19 LAB — CARDIOLIPIN ANTIBODIES, IGG, IGM, IGA
Anticardiolipin IgA: <9 APL U/mL (ref 0–11)
Anticardiolipin IgG: <9 GPL U/mL (ref 0–14)
Anticardiolipin IgM: 11 MPL U/mL (ref 0–12)

## 2014-12-19 LAB — BETA-2-GLYCOPROTEIN I ABS, IGG/M/A
Beta-2 Glyco I IgG: <9 GPI IgG units (ref 0–20)
Beta-2-Glycoprotein I IgA: <9 GPI IgA units (ref 0–25)
Beta-2-Glycoprotein I IgM: 10 GPI IgM units (ref 0–32)

## 2014-12-19 LAB — HEMOGLOBIN A1C
Hgb A1c MFr Bld: 5.8 % — ABNORMAL HIGH (ref 4.8–5.6)
Mean Plasma Glucose: 120 mg/dL

## 2014-12-19 MED ORDER — ONDANSETRON HCL 4 MG/2ML IJ SOLN
4.0000 mg | Freq: Four times a day (QID) | INTRAMUSCULAR | Status: DC | PRN
  Administered 2014-12-19: 4 mg via INTRAVENOUS
  Filled 2014-12-19: qty 2

## 2014-12-19 NOTE — Progress Notes (Signed)
EEG Completed; Results Pending  

## 2014-12-19 NOTE — Care Management Note (Signed)
Case Management Note  Patient Details  Name: [name] MRN: [id number] Date of Birth: 05-17-1950  Subjective/Objective:                  Pt admitted from home with CVA. Pt lives with his wife and will return home at discharge. Pt has a quad cane for home use. Pt is fairly independent with ADL's.  Action/Plan: No Cm needs noted. Anticipate discharge within 48 hours.  Expected Discharge Date:                  Expected Discharge Plan:  Home/Self Care  In-House Referral:  NA  Discharge planning Services  CM Consult  Post Acute Care Choice:  NA Choice offered to:  NA  DME Arranged:    DME Agency:     HH Arranged:    HH Agency:     Status of Service:  Completed, signed off  Medicare Important Message Given:    Date Medicare IM Given:    Medicare IM give by:    Date Additional Medicare IM Given:    Additional Medicare Important Message give by:     If discussed at [place] of Stay Meetings, dates discussed:    Additional Comments:  Joylene Draft, RN 12/19/2014, 3:48 PM

## 2014-12-19 NOTE — Progress Notes (Signed)
Subjective: He says he feels a little better. Thus far we don't have a definite cause of his acute change in his neurological situation. Although he has been worked up for seizure before he does have episodes of being "out of it" so I'm going to go ahead and order another EEG. Dr. Freddie Apley note is seen and appreciated  Objective: Vital signs in last 24 hours: Temp:  [97.5 F (36.4 C)-98.9 F (37.2 C)] 97.5 F (36.4 C) (11/01 0615) Pulse Rate:  [51-86] 59 (11/01 0615) Resp:  [16-20] 20 (11/01 0615) BP: (115-136)/(66-86) 133/85 mmHg (11/01 0615) SpO2:  [94 %-97 %] 97 % (11/01 0615) Weight change:  Last BM Date: 12/17/14  Intake/Output from previous day: 10/31 0701 - 11/01 0700 In: 240 [P.O.:240] Out: 250 [Urine:250]  PHYSICAL EXAM General appearance: alert, cooperative and Unchanged left side weakness Resp: clear to auscultation bilaterally Cardio: regular rate and rhythm, S1, S2 normal, no murmur, click, rub or gallop GI: soft, non-tender; bowel sounds normal; no masses,  no organomegaly Extremities: extremities normal, atraumatic, no cyanosis or edema  Lab Results:  Results for orders placed or performed during the hospital encounter of 12/17/14 (from the past 48 hour(s))  CBG monitoring, ED     Status: Abnormal   Collection Time: 12/17/14  4:22 PM  Result Value Ref Range   Glucose-Capillary 104 (H) 65 - 99 mg/dL  Protime-INR     Status: None   Collection Time: 12/17/14  4:24 PM  Result Value Ref Range   Prothrombin Time 14.2 11.6 - 15.2 seconds   INR 1.08 0.00 - 1.49  APTT     Status: None   Collection Time: 12/17/14  4:24 PM  Result Value Ref Range   aPTT 26 24 - 37 seconds  CBC     Status: None   Collection Time: 12/17/14  4:24 PM  Result Value Ref Range   WBC 8.2 4.0 - 10.5 K/uL   RBC 4.91 4.22 - 5.81 MIL/uL   Hemoglobin 16.3 13.0 - 17.0 g/dL   HCT 46.1 39.0 - 52.0 %   MCV 93.9 78.0 - 100.0 fL   MCH 33.2 26.0 - 34.0 pg   MCHC 35.4 30.0 - 36.0 g/dL   RDW  11.9 11.5 - 15.5 %   Platelets 218 150 - 400 K/uL  Differential     Status: None   Collection Time: 12/17/14  4:24 PM  Result Value Ref Range   Neutrophils Relative % 64 %   Neutro Abs 5.3 1.7 - 7.7 K/uL   Lymphocytes Relative 25 %   Lymphs Abs 2.0 0.7 - 4.0 K/uL   Monocytes Relative 8 %   Monocytes Absolute 0.7 0.1 - 1.0 K/uL   Eosinophils Relative 2 %   Eosinophils Absolute 0.2 0.0 - 0.7 K/uL   Basophils Relative 0 %   Basophils Absolute 0.0 0.0 - 0.1 K/uL  I-Stat Chem 8, ED  (not at [facility], [facility])     Status: Abnormal   Collection Time: 12/17/14  4:33 PM  Result Value Ref Range   Sodium 136 135 - 145 mmol/L   Potassium 6.6 (HH) 3.5 - 5.1 mmol/L   Chloride 103 101 - 111 mmol/L   BUN 23 (H) 6 - 20 mg/dL   Creatinine, Ser 0.90 0.61 - 1.24 mg/dL   Glucose, Bld 110 (H) 65 - 99 mg/dL   Calcium, Ion 1.10 (L) 1.13 - 1.30 mmol/L   TCO2 26 0 - 100 mmol/L   Hemoglobin 16.7 13.0 - 17.0  g/dL   HCT 49.0 39.0 - 52.0 %   Comment NOTIFIED PHYSICIAN   I-stat troponin, ED (not at [facility], [facility])     Status: None   Collection Time: 12/17/14  4:36 PM  Result Value Ref Range   Troponin i, poc 0.00 0.00 - 0.08 ng/mL   Comment 3            Comment: Due to the release kinetics of cTnI, a negative result within the first hours of the onset of symptoms does not rule out myocardial infarction with certainty. If myocardial infarction is still suspected, repeat the test at appropriate intervals.   CBG monitoring, ED     Status: Abnormal   Collection Time: 12/17/14  4:54 PM  Result Value Ref Range   Glucose-Capillary 111 (H) 65 - 99 mg/dL  Comprehensive metabolic panel     Status: Abnormal   Collection Time: 12/17/14  5:02 PM  Result Value Ref Range   Sodium 138 135 - 145 mmol/L   Potassium 3.8 3.5 - 5.1 mmol/L    Comment: DELTA CHECK NOTED   Chloride 105 101 - 111 mmol/L   CO2 25 22 - 32 mmol/L   Glucose, Bld 108 (H) 65 - 99 mg/dL   BUN 17 6 - 20 mg/dL   Creatinine, Ser 0.73 0.61 - 1.24 mg/dL    Calcium 9.6 8.9 - 10.3 mg/dL   Total Protein 7.3 6.5 - 8.1 g/dL   Albumin 4.5 3.5 - 5.0 g/dL   AST 22 15 - 41 U/L   ALT 26 17 - 63 U/L   Alkaline Phosphatase 66 38 - 126 U/L   Total Bilirubin 1.3 (H) 0.3 - 1.2 mg/dL   GFR calc non Af Amer >60 >60 mL/min   GFR calc Af Amer >60 >60 mL/min    Comment: (NOTE) The eGFR has been calculated using the CKD EPI equation. This calculation has not been validated in all clinical situations. eGFR's persistently <60 mL/min signify possible Chronic Kidney Disease.    Anion gap 8 5 - 15  Antithrombin III     Status: None   Collection Time: 12/18/14  7:04 AM  Result Value Ref Range   AntiThromb III Func 106 75 - 120 %    Comment: Performed at [facility]  Hemoglobin A1c     Status: Abnormal   Collection Time: 12/18/14  7:06 AM  Result Value Ref Range   Hgb A1c MFr Bld 5.8 (H) 4.8 - 5.6 %    Comment: (NOTE)         Pre-diabetes: 5.7 - 6.4         Diabetes: >6.4         Glycemic control for adults with diabetes: <7.0    Mean Plasma Glucose 120 mg/dL    Comment: (NOTE) Performed At: [facility] [place], [place] [id number] Lindon Romp MD VO:[id number]   Lipid panel     Status: Abnormal   Collection Time: 12/18/14  7:06 AM  Result Value Ref Range   Cholesterol 233 (H) 0 - 200 mg/dL   Triglycerides 202 (H) <150 mg/dL   HDL 43 >40 mg/dL   Total CHOL/HDL Ratio 5.4 RATIO   VLDL 40 0 - 40 mg/dL   LDL Cholesterol 150 (H) 0 - 99 mg/dL    Comment:        Total Cholesterol/HDL:CHD Risk Coronary Heart Disease Risk Table  Men   Women  1/2 Average Risk   3.4   3.3  Average Risk       5.0   4.4  2 X Average Risk   9.6   7.1  3 X Average Risk  23.4   11.0        Use the calculated Patient Ratio above and the CHD Risk Table to determine the patient's CHD Risk.        ATP III CLASSIFICATION (LDL):  <100     mg/dL   Optimal  100-129  mg/dL   Near or Above                    Optimal   130-159  mg/dL   Borderline  160-189  mg/dL   High  >190     mg/dL   Very High   Urinalysis, Routine w reflex microscopic (not at [facility])     Status: Abnormal   Collection Time: 12/18/14  5:15 PM  Result Value Ref Range   Color, Urine YELLOW YELLOW   APPearance CLEAR CLEAR   Specific Gravity, Urine >1.030 (H) 1.005 - 1.030   pH 5.0 5.0 - 8.0   Glucose, UA NEGATIVE NEGATIVE mg/dL   Hgb urine dipstick NEGATIVE NEGATIVE   Bilirubin Urine NEGATIVE NEGATIVE   Ketones, ur TRACE (A) NEGATIVE mg/dL   Protein, ur NEGATIVE NEGATIVE mg/dL   Urobilinogen, UA 0.2 0.0 - 1.0 mg/dL   Nitrite NEGATIVE NEGATIVE   Leukocytes, UA NEGATIVE NEGATIVE    Comment: MICROSCOPIC NOT DONE ON URINES WITH NEGATIVE PROTEIN, BLOOD, LEUKOCYTES, NITRITE, OR GLUCOSE <1000 mg/dL.    ABGS  Recent Labs  12/17/14 1633  TCO2 26   CULTURES No results found for this or any previous visit (from the past 240 hour(s)). Studies/Results: Ct Head Wo Contrast  12/17/2014  CLINICAL DATA:  Worsening left side weakness EXAM: CT HEAD WITHOUT CONTRAST TECHNIQUE: Contiguous axial images were obtained from the base of the skull through the vertex without intravenous contrast. COMPARISON:  09/10/13 FINDINGS: Calvarium is intact. No significant inflammatory change in the visualized portions of the paranasal sinuses. Mild age-related atrophy. No evidence of cortical infarct or mass. No hydrocephalus. No hemorrhage or extra-axial fluid. IMPRESSION: Negative head CT. Critical Value/emergent results were called by telephone at the time of interpretation on 12/17/2014 at 4:47 pm to Dr. Elnora Morrison , who verbally acknowledged these results. Electronically Signed   By: Skipper Cliche M.D.   On: 12/17/2014 16:47   [name] Wo Contrast  12/18/2014  CLINICAL DATA:  64 year old male with left side weakness for 1 day. Code stroke. Initial encounter. EXAM: MRI HEAD WITHOUT CONTRAST MRA HEAD WITHOUT CONTRAST MRA NECK WITHOUT AND WITH CONTRAST  TECHNIQUE: Multiplanar, multiecho pulse sequences of the brain and surrounding structures were obtained without and with intravenous contrast. Angiographic images of the Circle of Willis were obtained using MRA technique without intravenous contrast. Angiographic images of the neck were obtained using MRA technique without and with intravenous contrast. Carotid stenosis measurements (when applicable) are obtained utilizing NASCET criteria, using the distal internal carotid diameter as the denominator. CONTRAST:  85mL MULTIHANCE GADOBENATE DIMEGLUMINE 529 MG/ML IV SOLN COMPARISON:  Head CT without contrast 12/17/2014. Brain MRI 09/06/2014 and earlier. Intracranial MRA 06/20/2008. FINDINGS: MRI HEAD FINDINGS Cerebral volume is within normal limits for age. No restricted diffusion to suggest acute infarction. No midline shift, mass effect, evidence of mass lesion, ventriculomegaly, extra-axial collection or acute intracranial hemorrhage. Cervicomedullary junction and  pituitary are within normal limits. Major intracranial vascular flow voids are preserved, dominant appearing distal right vertebral artery. Several small bilateral chronic cerebellar infarcts. No chronic cerebral blood products identified. Subtle, Small foci of cortically based encephalomalacia about the right motor strip near it the arm representation (series 6, images 20 and 19). These are chronic. Otherwise normal for age supratentorial gray and white matter signal. Visible internal auditory structures appear normal. Mastoids are clear. Mild paranasal sinus mucosal thickening. Negative scalp and orbits soft tissues. Negative visualized cervical spine. MRA NECK FINDINGS Precontrast time-of-flight images demonstrate antegrade flow in both carotid and vertebral arteries. Carotid bifurcations appear within normal limits. The right vertebral artery is dominant. Postcontrast images reveal a 3 vessel arch configuration. Mildly tortuous CCAs, more so the right.  Widely patent right ICA origin and bulb. Mildly to moderately tortuous cervical right ICA otherwise is negative. Widely patent left ICA origin and bulb.  Negative cervical left ICA. Dominant right vertebral artery. The non dominant left vertebral artery origin is not well visualized. No vertebral artery stenosis identified. MRA HEAD FINDINGS Dominant distal right vertebral artery re- identified. Both distal vertebral arteries are patent without stenosis. The left PICA origin remains patent. Mild basilar artery tortuosity is stable, no basilar stenosis. SCA and PCA origins remain normal. Posterior communicating arteries are diminutive or absent. Bilateral PCA branches are stable and within normal limits. Stable antegrade flow in both ICA siphons. Ophthalmic artery origins are normal. Carotid termini, MCA and ACA origins remain normal. Visualized ACA branches are stable and within normal limits. Bilateral MCA M1 segments and MCA branches are stable and within normal limits. IMPRESSION: 1. No acute intracranial abnormality. Stable noncontrast MRI appearance of the brain with moderate chronic cerebellar small vessel ischemia. Suspect several chronic small vessel cortical infarcts about the right motor strip. 2. Negative neck MRA aside from vessel tortuosity. 3. Stable and negative intracranial MRA. Electronically Signed   By: Genevie Ann M.D.   On: 12/18/2014 09:56   Mr Angiogram Neck W Wo Contrast  12/18/2014  CLINICAL DATA:  64 year old male with left side weakness for 1 day. Code stroke. Initial encounter. EXAM: MRI HEAD WITHOUT CONTRAST MRA HEAD WITHOUT CONTRAST MRA NECK WITHOUT AND WITH CONTRAST TECHNIQUE: Multiplanar, multiecho pulse sequences of the brain and surrounding structures were obtained without and with intravenous contrast. Angiographic images of the Circle of Willis were obtained using MRA technique without intravenous contrast. Angiographic images of the neck were obtained using MRA technique without  and with intravenous contrast. Carotid stenosis measurements (when applicable) are obtained utilizing NASCET criteria, using the distal internal carotid diameter as the denominator. CONTRAST:  65mL MULTIHANCE GADOBENATE DIMEGLUMINE 529 MG/ML IV SOLN COMPARISON:  Head CT without contrast 12/17/2014. Brain MRI 09/06/2014 and earlier. Intracranial MRA 06/20/2008. FINDINGS: MRI HEAD FINDINGS Cerebral volume is within normal limits for age. No restricted diffusion to suggest acute infarction. No midline shift, mass effect, evidence of mass lesion, ventriculomegaly, extra-axial collection or acute intracranial hemorrhage. Cervicomedullary junction and pituitary are within normal limits. Major intracranial vascular flow voids are preserved, dominant appearing distal right vertebral artery. Several small bilateral chronic cerebellar infarcts. No chronic cerebral blood products identified. Subtle, Small foci of cortically based encephalomalacia about the right motor strip near it the arm representation (series 6, images 20 and 19). These are chronic. Otherwise normal for age supratentorial gray and white matter signal. Visible internal auditory structures appear normal. Mastoids are clear. Mild paranasal sinus mucosal thickening. Negative scalp and orbits soft tissues. Negative visualized  cervical spine. MRA NECK FINDINGS Precontrast time-of-flight images demonstrate antegrade flow in both carotid and vertebral arteries. Carotid bifurcations appear within normal limits. The right vertebral artery is dominant. Postcontrast images reveal a 3 vessel arch configuration. Mildly tortuous CCAs, more so the right. Widely patent right ICA origin and bulb. Mildly to moderately tortuous cervical right ICA otherwise is negative. Widely patent left ICA origin and bulb.  Negative cervical left ICA. Dominant right vertebral artery. The non dominant left vertebral artery origin is not well visualized. No vertebral artery stenosis  identified. MRA HEAD FINDINGS Dominant distal right vertebral artery re- identified. Both distal vertebral arteries are patent without stenosis. The left PICA origin remains patent. Mild basilar artery tortuosity is stable, no basilar stenosis. SCA and PCA origins remain normal. Posterior communicating arteries are diminutive or absent. Bilateral PCA branches are stable and within normal limits. Stable antegrade flow in both ICA siphons. Ophthalmic artery origins are normal. Carotid termini, MCA and ACA origins remain normal. Visualized ACA branches are stable and within normal limits. Bilateral MCA M1 segments and MCA branches are stable and within normal limits. IMPRESSION: 1. No acute intracranial abnormality. Stable noncontrast MRI appearance of the brain with moderate chronic cerebellar small vessel ischemia. Suspect several chronic small vessel cortical infarcts about the right motor strip. 2. Negative neck MRA aside from vessel tortuosity. 3. Stable and negative intracranial MRA. Electronically Signed   By: Genevie Ann M.D.   On: 12/18/2014 09:56   Mr Brain Wo Contrast  12/18/2014  CLINICAL DATA:  64 year old male with left side weakness for 1 day. Code stroke. Initial encounter. EXAM: MRI HEAD WITHOUT CONTRAST MRA HEAD WITHOUT CONTRAST MRA NECK WITHOUT AND WITH CONTRAST TECHNIQUE: Multiplanar, multiecho pulse sequences of the brain and surrounding structures were obtained without and with intravenous contrast. Angiographic images of the Circle of Willis were obtained using MRA technique without intravenous contrast. Angiographic images of the neck were obtained using MRA technique without and with intravenous contrast. Carotid stenosis measurements (when applicable) are obtained utilizing NASCET criteria, using the distal internal carotid diameter as the denominator. CONTRAST:  21mL MULTIHANCE GADOBENATE DIMEGLUMINE 529 MG/ML IV SOLN COMPARISON:  Head CT without contrast 12/17/2014. Brain MRI 09/06/2014 and  earlier. Intracranial MRA 06/20/2008. FINDINGS: MRI HEAD FINDINGS Cerebral volume is within normal limits for age. No restricted diffusion to suggest acute infarction. No midline shift, mass effect, evidence of mass lesion, ventriculomegaly, extra-axial collection or acute intracranial hemorrhage. Cervicomedullary junction and pituitary are within normal limits. Major intracranial vascular flow voids are preserved, dominant appearing distal right vertebral artery. Several small bilateral chronic cerebellar infarcts. No chronic cerebral blood products identified. Subtle, Small foci of cortically based encephalomalacia about the right motor strip near it the arm representation (series 6, images 20 and 19). These are chronic. Otherwise normal for age supratentorial gray and white matter signal. Visible internal auditory structures appear normal. Mastoids are clear. Mild paranasal sinus mucosal thickening. Negative scalp and orbits soft tissues. Negative visualized cervical spine. MRA NECK FINDINGS Precontrast time-of-flight images demonstrate antegrade flow in both carotid and vertebral arteries. Carotid bifurcations appear within normal limits. The right vertebral artery is dominant. Postcontrast images reveal a 3 vessel arch configuration. Mildly tortuous CCAs, more so the right. Widely patent right ICA origin and bulb. Mildly to moderately tortuous cervical right ICA otherwise is negative. Widely patent left ICA origin and bulb.  Negative cervical left ICA. Dominant right vertebral artery. The non dominant left vertebral artery origin is not well visualized. No vertebral  artery stenosis identified. MRA HEAD FINDINGS Dominant distal right vertebral artery re- identified. Both distal vertebral arteries are patent without stenosis. The left PICA origin remains patent. Mild basilar artery tortuosity is stable, no basilar stenosis. SCA and PCA origins remain normal. Posterior communicating arteries are diminutive or  absent. Bilateral PCA branches are stable and within normal limits. Stable antegrade flow in both ICA siphons. Ophthalmic artery origins are normal. Carotid termini, MCA and ACA origins remain normal. Visualized ACA branches are stable and within normal limits. Bilateral MCA M1 segments and MCA branches are stable and within normal limits. IMPRESSION: 1. No acute intracranial abnormality. Stable noncontrast MRI appearance of the brain with moderate chronic cerebellar small vessel ischemia. Suspect several chronic small vessel cortical infarcts about the right motor strip. 2. Negative neck MRA aside from vessel tortuosity. 3. Stable and negative intracranial MRA. Electronically Signed   By: Genevie Ann M.D.   On: 12/18/2014 09:56    Medications:  Prior to Admission:  Prescriptions prior to admission  Medication Sig Dispense Refill Last Dose  . acetaminophen (TYLENOL) 500 MG tablet Take 1,000 mg by mouth every 6 (six) hours as needed for mild pain or moderate pain. For pain   12/17/2014 at Unknown time  . ALPRAZolam (XANAX) 0.5 MG tablet Take 0.5 mg by mouth daily as needed for anxiety or sleep.    unknown  . aspirin 325 MG EC tablet Take 325 mg by mouth daily.   12/17/2014 at Unknown time  . calcium citrate-vitamin D (CITRACAL+D) 315-200 MG-UNIT per tablet Take 2 tablets by mouth daily.   12/17/2014 at Unknown time  . clopidogrel (PLAVIX) 75 MG tablet Take 75 mg by mouth daily.    12/17/2014 at Unknown time  . Cyanocobalamin (VITAMIN B 12 PO) Take 1 tablet by mouth daily.   12/17/2014 at Unknown time  . Multiple Vitamins-Minerals (CENTRUM SILVER ULTRA MENS PO) Take 1 tablet by mouth daily.   12/17/2014 at Unknown time  . Omega-3 Fatty Acids (FISH OIL) 1200 MG CAPS Take 1 capsule by mouth daily.    12/17/2014 at Unknown time  . thiamine (VITAMIN B-1) 100 MG tablet Take 100 mg by mouth daily.   12/17/2014 at Unknown time  . venlafaxine XR (EFFEXOR-XR) 37.5 MG 24 hr capsule Take 37.5 mg by mouth daily with  breakfast.    12/17/2014 at Unknown time   Scheduled: .  stroke: mapping our early stages of recovery book   Does not apply Once  . aspirin  325 mg Oral Daily  . calcium-vitamin D  2 tablet Oral Daily  . clopidogrel  75 mg Oral Daily  . enoxaparin (LOVENOX) injection  40 mg Subcutaneous Q24H  . omega-3 acid ethyl esters  1 g Oral BID  . thiamine  100 mg Oral Daily  . venlafaxine XR  37.5 mg Oral Q breakfast   Continuous:  NOB:[id number], ALPRAZolam, senna-docusate  Assesment: He was thought to have acute ischemic stroke but that has not had gout on his MRI. It is not totally clear what happened. He is going to have EEG. He has elevated cholesterol but is intolerant of statins. Principal Problem:   Acute ischemic stroke [facility]) Active Problems:   PTSD   Essential hypertension   PATENT FORAMEN OVALE   Altered mental status   Anxiety state   CVA (cerebral infarction)    Plan: EEG today. Try to get him up and moving around more.    LOS: 2 days   Shaquetta Arcos L 12/19/2014, 8:56  AM   

## 2014-12-19 NOTE — Progress Notes (Signed)
Patient ID: [name], male   DOB: 07-28-1950, 64 y.o.   MRN: [id number]  [place] A. Merlene Laughter, MD     [email or web address]          [name] is an 64 y.o. male.   Assessment/Plan: Worsening left-sided weakness associated with confusion, altered mental status and headaches. Differential diagnosis includes recurrent ischemic infarct on the contralateral side, complex migraine/migraine with aura, seizures and medication effect. Metabolic issues may also be the etiology particularly urine tract infection.  Other episodes of recurrent confusion due to medication effect.  Prior cerebral infarcts.  History of episodic migraines.  Hypertension.  RECOMMENDATION:  EEG. Continue with dual antiplatelet agents.    Patient reports that he is doing about the same and probably improved in terms of his left-sided symptoms. He still has headaches. His headaches are more mild and this seems more consistent with his baseline headaches. There are holocephalic headaches.  GENERAL: He is in no acute distress.  HEENT: Supple. Atraumatic normocephalic.   ABDOMEN: soft  EXTREMITIES: No edema   BACK: Normal.  SKIN: Normal by inspection.   MENTAL STATUS: Alert and oriented. Language and cognition are generally intact. Judgment and insight normal. Speech appears to be mild to moderately dysarthric.  CRANIAL NERVES: Pupils are equal, round and reactive to light and accommodation; extra ocular movements are full, there is no significant nystagmus; visual fields are full; upper and lower facial muscles are normal in strength and symmetric, there is no flattening of the nasolabial folds; tongue is midline; uvula is midline; shoulder elevation is normal.  MOTOR: Normal tone, bulk and strength on the right; no pronator drift. Mild left upper extremity downwards drift.   COORDINATION: Left finger to nose is normal, right finger to nose is normal, No rest tremor;  no intention tremor; no postural tremor; no bradykinesia.  REFLEXES: Deep tendon reflexes are symmetrical and normal. Babinski reflexes are flexor bilaterally.   SENSATION: Impaired sensation to light touch and temperature left upper extremity and left leg.     Objective: Vital signs in last 24 hours: Temp:  [97.5 F (36.4 C)-98.9 F (37.2 C)] 97.5 F (36.4 C) (11/01 0615) Pulse Rate:  [51-86] 59 (11/01 0615) Resp:  [16-20] 20 (11/01 0615) BP: (115-136)/(66-86) 133/85 mmHg (11/01 0615) SpO2:  [94 %-97 %] 97 % (11/01 0615)  Intake/Output from previous day: 10/31 0701 - 11/01 0700 In: 240 [P.O.:240] Out: 250 [Urine:250] Intake/Output this shift: Total I/O In: 240 [P.O.:240] Out: -  Nutritional status: Diet regular Room service appropriate?: Yes; Fluid consistency:: Thin   Lab Results: Results for orders placed or performed during the hospital encounter of 12/17/14 (from the past 48 hour(s))  CBG monitoring, ED     Status: Abnormal   Collection Time: 12/17/14  4:22 PM  Result Value Ref Range   Glucose-Capillary 104 (H) 65 - 99 mg/dL  Protime-INR     Status: None   Collection Time: 12/17/14  4:24 PM  Result Value Ref Range   Prothrombin Time 14.2 11.6 - 15.2 seconds   INR 1.08 0.00 - 1.49  APTT     Status: None   Collection Time: 12/17/14  4:24 PM  Result Value Ref Range   aPTT 26 24 - 37 seconds  CBC     Status: None   Collection Time: 12/17/14  4:24 PM  Result Value Ref Range   WBC 8.2 4.0 - 10.5 K/uL   RBC 4.91 4.22 - 5.81 MIL/uL   Hemoglobin 16.3  13.0 - 17.0 g/dL   HCT 46.1 39.0 - 52.0 %   MCV 93.9 78.0 - 100.0 fL   MCH 33.2 26.0 - 34.0 pg   MCHC 35.4 30.0 - 36.0 g/dL   RDW 11.9 11.5 - 15.5 %   Platelets 218 150 - 400 K/uL  Differential     Status: None   Collection Time: 12/17/14  4:24 PM  Result Value Ref Range   Neutrophils Relative % 64 %   Neutro Abs 5.3 1.7 - 7.7 K/uL   Lymphocytes Relative 25 %   Lymphs Abs 2.0 0.7 - 4.0 K/uL   Monocytes  Relative 8 %   Monocytes Absolute 0.7 0.1 - 1.0 K/uL   Eosinophils Relative 2 %   Eosinophils Absolute 0.2 0.0 - 0.7 K/uL   Basophils Relative 0 %   Basophils Absolute 0.0 0.0 - 0.1 K/uL  I-Stat Chem 8, ED  (not at [facility], [facility])     Status: Abnormal   Collection Time: 12/17/14  4:33 PM  Result Value Ref Range   Sodium 136 135 - 145 mmol/L   Potassium 6.6 (HH) 3.5 - 5.1 mmol/L   Chloride 103 101 - 111 mmol/L   BUN 23 (H) 6 - 20 mg/dL   Creatinine, Ser 0.90 0.61 - 1.24 mg/dL   Glucose, Bld 110 (H) 65 - 99 mg/dL   Calcium, Ion 1.10 (L) 1.13 - 1.30 mmol/L   TCO2 26 0 - 100 mmol/L   Hemoglobin 16.7 13.0 - 17.0 g/dL   HCT 49.0 39.0 - 52.0 %   Comment NOTIFIED PHYSICIAN   I-stat troponin, ED (not at [facility], [facility])     Status: None   Collection Time: 12/17/14  4:36 PM  Result Value Ref Range   Troponin i, poc 0.00 0.00 - 0.08 ng/mL   Comment 3            Comment: Due to the release kinetics of cTnI, a negative result within the first hours of the onset of symptoms does not rule out myocardial infarction with certainty. If myocardial infarction is still suspected, repeat the test at appropriate intervals.   CBG monitoring, ED     Status: Abnormal   Collection Time: 12/17/14  4:54 PM  Result Value Ref Range   Glucose-Capillary 111 (H) 65 - 99 mg/dL  Comprehensive metabolic panel     Status: Abnormal   Collection Time: 12/17/14  5:02 PM  Result Value Ref Range   Sodium 138 135 - 145 mmol/L   Potassium 3.8 3.5 - 5.1 mmol/L    Comment: DELTA CHECK NOTED   Chloride 105 101 - 111 mmol/L   CO2 25 22 - 32 mmol/L   Glucose, Bld 108 (H) 65 - 99 mg/dL   BUN 17 6 - 20 mg/dL   Creatinine, Ser 0.73 0.61 - 1.24 mg/dL   Calcium 9.6 8.9 - 10.3 mg/dL   Total Protein 7.3 6.5 - 8.1 g/dL   Albumin 4.5 3.5 - 5.0 g/dL   AST 22 15 - 41 U/L   ALT 26 17 - 63 U/L   Alkaline Phosphatase 66 38 - 126 U/L   Total Bilirubin 1.3 (H) 0.3 - 1.2 mg/dL   GFR calc non Af Amer >60 >60 mL/min   GFR calc Af Amer >60  >60 mL/min    Comment: (NOTE) The eGFR has been calculated using the CKD EPI equation. This calculation has not been validated in all clinical situations. eGFR's persistently <60 mL/min signify possible Chronic Kidney Disease.  Anion gap 8 5 - 15  Antithrombin III     Status: None   Collection Time: 12/18/14  7:04 AM  Result Value Ref Range   AntiThromb III Func 106 75 - 120 %    Comment: Performed at [facility]  Hemoglobin A1c     Status: Abnormal   Collection Time: 12/18/14  7:06 AM  Result Value Ref Range   Hgb A1c MFr Bld 5.8 (H) 4.8 - 5.6 %    Comment: (NOTE)         Pre-diabetes: 5.7 - 6.4         Diabetes: >6.4         Glycemic control for adults with diabetes: <7.0    Mean Plasma Glucose 120 mg/dL    Comment: (NOTE) Performed At: [facility] [place], [place] [id number] Lindon Romp MD NG:[id number]   Lipid panel     Status: Abnormal   Collection Time: 12/18/14  7:06 AM  Result Value Ref Range   Cholesterol 233 (H) 0 - 200 mg/dL   Triglycerides 202 (H) <150 mg/dL   HDL 43 >40 mg/dL   Total CHOL/HDL Ratio 5.4 RATIO   VLDL 40 0 - 40 mg/dL   LDL Cholesterol 150 (H) 0 - 99 mg/dL    Comment:        Total Cholesterol/HDL:CHD Risk Coronary Heart Disease Risk Table                     Men   Women  1/2 Average Risk   3.4   3.3  Average Risk       5.0   4.4  2 X Average Risk   9.6   7.1  3 X Average Risk  23.4   11.0        Use the calculated Patient Ratio above and the CHD Risk Table to determine the patient's CHD Risk.        ATP III CLASSIFICATION (LDL):  <100     mg/dL   Optimal  100-129  mg/dL   Near or Above                    Optimal  130-159  mg/dL   Borderline  160-189  mg/dL   High  >190     mg/dL   Very High   Urinalysis, Routine w reflex microscopic (not at [facility])     Status: Abnormal   Collection Time: 12/18/14  5:15 PM  Result Value Ref Range   Color, Urine YELLOW YELLOW   APPearance CLEAR CLEAR   Specific  Gravity, Urine >1.030 (H) 1.005 - 1.030   pH 5.0 5.0 - 8.0   Glucose, UA NEGATIVE NEGATIVE mg/dL   Hgb urine dipstick NEGATIVE NEGATIVE   Bilirubin Urine NEGATIVE NEGATIVE   Ketones, ur TRACE (A) NEGATIVE mg/dL   Protein, ur NEGATIVE NEGATIVE mg/dL   Urobilinogen, UA 0.2 0.0 - 1.0 mg/dL   Nitrite NEGATIVE NEGATIVE   Leukocytes, UA NEGATIVE NEGATIVE    Comment: MICROSCOPIC NOT DONE ON URINES WITH NEGATIVE PROTEIN, BLOOD, LEUKOCYTES, NITRITE, OR GLUCOSE <1000 mg/dL.    Lipid Panel  Recent Labs  12/18/14 0706  CHOL 233*  TRIG 202*  HDL 43  CHOLHDL 5.4  VLDL 40  LDLCALC 150*    Studies/Results: BRAIN MRI/MRA NECK MRA 1. No acute intracranial abnormality. Stable noncontrast MRI appearance of the brain with moderate chronic cerebellar small vessel ischemia. Suspect several chronic small  vessel cortical infarcts about the right motor strip. 2. Negative neck MRA aside from vessel tortuosity. 3. Stable and negative intracranial MRA   Medications:  Scheduled Meds: .  stroke: mapping our early stages of recovery book   Does not apply Once  . aspirin  325 mg Oral Daily  . calcium-vitamin D  2 tablet Oral Daily  . clopidogrel  75 mg Oral Daily  . enoxaparin (LOVENOX) injection  40 mg Subcutaneous Q24H  . omega-3 acid ethyl esters  1 g Oral BID  . thiamine  100 mg Oral Daily  . venlafaxine XR  37.5 mg Oral Q breakfast   Continuous Infusions:  PRN Meds:.acetaminophen, ALPRAZolam, senna-docusate     LOS: 2 days    A. Merlene Laughter, M.D.  Diplomate, Tax adviser of Psychiatry and Neurology ( Neurology).

## 2014-12-19 NOTE — Evaluation (Signed)
Occupational Therapy Evaluation Patient Details Name: [name] MRN: [id number] DOB: October 27, 1950 Today's Date: 12/19/2014    History of Present Illness [name] is a right handed 64 y.o. male with hx of prior CVA, with residual left hemiparesis, Hx of PFO, s/p closure at [facility], hx of anxiety, depression, PTSD, hx of post concussive syndrome, presented to the ER with worsening left sided weakness, with ictus about4 hours PTA to the ED. Code stroke was initiated, and his head CT showed no acute process. Teleneurology was performed, but TPA was not recommended, as it was difficult to delineate old from new symptom deficit. Wife at bedsided related that he has some intermittent confusion, notably since started taking low dose Effexor of 37.5mg  per day. Work up in the ER showed unremarkable serology. His EKG showed normal sinus rhythm. He had seen Dr Merlene Laughter in the past, and reportedly had negative EEG. He has also been compliant with his DUAT (dual antiplatelet therapy, ASA full dose and Plavix). Hospitalist was asked to admit him for acute stroke. He had some slurred speech per his wife last week, and he felt that his vision was blurry and he had mild double vision as well.    Clinical Impression   Pt awake, alert, and oriented this am, very cooperative. Pt demonstrates independence in ADL tasks, and reports LUE weakness to be at baseline. He feels his LUE is stronger than when he arrived and the double vision has resolved. Pt has decreased grip/pinch strength and fine motor coordination at baseline, discussed with pt compensatory strategies that he uses at home. No further OT services required at this time at pt is at baseline with ADL and IADL tasks.    Follow Up Recommendations  No OT follow up    Equipment Recommendations  None recommended by OT       Precautions / Restrictions Precautions Precautions: None Restrictions Weight Bearing Restrictions: No       Mobility Bed Mobility Overal bed mobility: Modified Independent                                ADL Overall ADL's : Modified independent Eating/Feeding: Independent                   Lower Body Dressing: Independent                       Vision Vision Assessment?: No apparent visual deficits          Pertinent Vitals/Pain Pain Assessment: No/denies pain     Hand Dominance Right   Extremity/Trunk Assessment Upper Extremity Assessment Upper Extremity Assessment: LUE deficits/detail LUE Deficits / Details: LUE weakness 4/5, at baseline LUE Coordination: decreased fine motor   Lower Extremity Assessment Lower Extremity Assessment: Defer to PT evaluation LLE Deficits / Details: old left hemiparesis with generalized weakness (3/5) which is unchanged from normal LLE Sensation: history of peripheral neuropathy;decreased proprioception   Cervical / Trunk Assessment Cervical / Trunk Assessment: Kyphotic   Communication Communication Communication: No difficulties   Cognition Arousal/Alertness: Awake/alert Behavior During Therapy: WFL for tasks assessed/performed Overall Cognitive Status: Within Functional Limits for tasks assessed                                Home Living Family/patient expects to be discharged to:: Private residence Living Arrangements: Spouse/significant other  Available Help at Discharge: Family;Available 24 hours/day Type of Home: House             Bathroom Shower/Tub: Walk-in Psychologist, prison and probation services: Standard     Home Equipment: Cane - quad;Cane - single point          Prior Functioning/Environment Level of Independence: Independent with assistive device(s)        Comments: Pt uses a quad cane at home and in community for functional mobility      End of Session    Activity Tolerance: Patient tolerated treatment well Patient left: in bed;with call bell/phone within reach   Time:  0815-0832 OT Time Calculation (min): 17 min Charges:  OT General Charges $OT Visit: 1 Procedure OT Evaluation $Initial OT Evaluation Tier I: 1 Procedure G-Codes: OT G-codes **NOT FOR INPATIENT CLASS** Functional Assessment Tool Used: clinical judgement Functional Limitation: Self care Self Care Current Status (S8110): At least 1 percent but less than 20 percent impaired, limited or restricted Self Care Goal Status (R1594): At least 1 percent but less than 20 percent impaired, limited or restricted Self Care Discharge Status [phone]): At least 1 percent but less than 20 percent impaired, limited or restricted   [name], OTR/L  [phone]  12/19/2014, 8:39 AM

## 2014-12-19 NOTE — Progress Notes (Signed)
Speech Language Pathology Treatment:    Patient Details Name: SHAWNDALE KILPATRICK MRN: [id number] DOB: 03-07-1950 Today's Date: 12/19/2014 Time:  - 5:15 PM    Assessment / Plan / Recommendation Clinical Impression  Mr. Hayward reports tolerating diet well, no changes in swallow function from baseline. He acknowledges that he should refrain from use of straws and should take small sips. He consumed 90% of dinner meal. No further SLP services indicated at this time. Pt in agreement.   HPI Other Pertinent Information: Pt is a right handed 64 y.o. male with hx of prior CVA, with residual left hemiparesis, Hx of PFO, s/p closure at [facility], hx of anxiety, depression, PTSD, hx of post concussive syndrome, presented to the ER with worsening left sided weakness, with ictus about 4 hours PTA to the ED. CT of head and MRI of head negative for acute intracranial abnormality with chronic changes. ST to evaluate current swallow function.   Pertinent Vitals  VSS  SLP Plan   D/C from care   Recommendations                     GO Functional Assessment Tool Used: clinical judgement, skilled observation Functional Limitations: Swallowing Swallow Goal Status (F7494): At least 1 percent but less than 20 percent impaired, limited or restricted Swallow Discharge Status [phone]): At least 1 percent but less than 20 percent impaired, limited or restricted   Thank you,  [name], [place]  [place] 12/19/2014, 6:07 PM

## 2014-12-20 DIAGNOSIS — R51 Headache: Secondary | ICD-10-CM | POA: Diagnosis not present

## 2014-12-20 DIAGNOSIS — F329 Major depressive disorder, single episode, unspecified: Secondary | ICD-10-CM | POA: Diagnosis not present

## 2014-12-20 DIAGNOSIS — R569 Unspecified convulsions: Secondary | ICD-10-CM | POA: Diagnosis not present

## 2014-12-20 DIAGNOSIS — F4312 Post-traumatic stress disorder, chronic: Secondary | ICD-10-CM | POA: Diagnosis not present

## 2014-12-20 DIAGNOSIS — R270 Ataxia, unspecified: Secondary | ICD-10-CM | POA: Diagnosis not present

## 2014-12-20 DIAGNOSIS — E785 Hyperlipidemia, unspecified: Secondary | ICD-10-CM | POA: Diagnosis not present

## 2014-12-20 DIAGNOSIS — I6990 Unspecified sequelae of unspecified cerebrovascular disease: Secondary | ICD-10-CM | POA: Diagnosis not present

## 2014-12-20 DIAGNOSIS — R4182 Altered mental status, unspecified: Secondary | ICD-10-CM | POA: Diagnosis not present

## 2014-12-20 DIAGNOSIS — I6789 Other cerebrovascular disease: Secondary | ICD-10-CM | POA: Diagnosis not present

## 2014-12-20 LAB — PROTEIN C, TOTAL: Protein C, Total: 100 % (ref 60–150)

## 2014-12-20 NOTE — Procedures (Signed)
  [place] A. Merlene Laughter, MD     [email or web address]           HISTORY: The patient is a 64 year old male who presents with confusional spells suspicious for complex partial seizures.  MEDICATIONS: Scheduled Meds: . aspirin  325 mg Oral Daily  . calcium-vitamin D  2 tablet Oral Daily  . clopidogrel  75 mg Oral Daily  . enoxaparin (LOVENOX) injection  40 mg Subcutaneous Q24H  . omega-3 acid ethyl esters  1 g Oral BID  . thiamine  100 mg Oral Daily  . venlafaxine XR  37.5 mg Oral Q breakfast   Continuous Infusions:  PRN Meds:.acetaminophen, ALPRAZolam, ondansetron (ZOFRAN) IV, senna-docusate  Prior to Admission medications   Medication Sig Start Date End Date Taking? Authorizing Provider  acetaminophen (TYLENOL) 500 MG tablet Take 1,000 mg by mouth every 6 (six) hours as needed for mild pain or moderate pain. For pain   Yes Historical Provider, MD  ALPRAZolam Duanne Moron) 0.5 MG tablet Take 0.5 mg by mouth daily as needed for anxiety or sleep.  12/02/14  Yes Historical Provider, MD  aspirin 325 MG EC tablet Take 325 mg by mouth daily.   Yes Historical Provider, MD  calcium citrate-vitamin D (CITRACAL+D) 315-200 MG-UNIT per tablet Take 2 tablets by mouth daily.   Yes Historical Provider, MD  clopidogrel (PLAVIX) 75 MG tablet Take 75 mg by mouth daily.  12/02/14  Yes Historical Provider, MD  Cyanocobalamin (VITAMIN B 12 PO) Take 1 tablet by mouth daily.   Yes Historical Provider, MD  Multiple Vitamins-Minerals (CENTRUM SILVER ULTRA MENS PO) Take 1 tablet by mouth daily.   Yes Historical Provider, MD  Omega-3 Fatty Acids (FISH OIL) 1200 MG CAPS Take 1 capsule by mouth daily.    Yes Historical Provider, MD  thiamine (VITAMIN B-1) 100 MG tablet Take 100 mg by mouth daily.   Yes Historical Provider, MD  venlafaxine XR (EFFEXOR-XR) 37.5 MG 24 hr capsule Take 37.5 mg by mouth daily with breakfast.  12/12/14  Yes Historical Provider, MD      ANALYSIS: A 16 channel recording  using standard 10 20 measurements is conducted for 22 minutes. There is a well-formed posterior dominant rhythm of 12 Hz which attenuates with eye opening. There is beta activity observed in the frontal areas. Awake and drowsy activities are observed. Fundus examination is carried out without abnormal changes in the background activity. There is no focal or lateral slowing. There is no epileptiform activity is observed.   IMPRESSION: This is a normal recording of the awake and drowsy states.      Eleni Frank A. Merlene Laughter, M.D.  Diplomate, Tax adviser of Psychiatry and Neurology ( Neurology).

## 2014-12-20 NOTE — Care Management Note (Signed)
Case Management Note  Patient Details  Name: [name] MRN: [id number] Date of Birth: [birth year]  Subjective/Objective:                    Action/Plan:   Expected Discharge Date:                  Expected Discharge Plan:  Home/Self Care  In-House Referral:  NA  Discharge planning Services  CM Consult  Post Acute Care Choice:  NA Choice offered to:  NA  DME Arranged:    DME Agency:     HH Arranged:    HH Agency:     Status of Service:  Completed, signed off  Medicare Important Message Given:    Date Medicare IM Given:    Medicare IM give by:    Date Additional Medicare IM Given:    Additional Medicare Important Message give by:     If discussed at [place] of Stay Meetings, dates discussed:    Additional Comments: Anticipate discharge today once cleared by neurology. No Cm needs noted. Christinia Gully Duncan, RN 12/20/2014, 2:49 PM

## 2014-12-20 NOTE — Progress Notes (Signed)
Subjective: He says he feels okay. He had an episode yesterday during his EEG of being feverish and feeling bad with some nausea. He has not had any more fever and he really doesn't have any other symptoms now. His EEG was normal. I don't think we have a definite explanation for his episode  Objective: Vital signs in last 24 hours: Temp:  [99.7 F (37.6 C)-100.5 F (38.1 C)] 99.9 F (37.7 C) (11/02 0511) Pulse Rate:  [71-95] 71 (11/02 0511) Resp:  [20] 20 (11/02 0511) BP: (99-127)/(56-75) 114/64 mmHg (11/02 0511) SpO2:  [95 %-98 %] 95 % (11/02 0511) Weight change:  Last BM Date: 12/18/14  Intake/Output from previous day: 11/01 0701 - 11/02 0700 In: 720 [P.O.:720] Out: 300 [Urine:300]  PHYSICAL EXAM General appearance: alert, cooperative and no distress Resp: clear to auscultation bilaterally Cardio: regular rate and rhythm, S1, S2 normal, no murmur, click, rub or gallop GI: soft, non-tender; bowel sounds normal; no masses,  no organomegaly Extremities: extremities normal, atraumatic, no cyanosis or edema  Lab Results:  Results for orders placed or performed during the hospital encounter of 12/17/14 (from the past 48 hour(s))  Urinalysis, Routine w reflex microscopic (not at [facility])     Status: Abnormal   Collection Time: 12/18/14  5:15 PM  Result Value Ref Range   Color, Urine YELLOW YELLOW   APPearance CLEAR CLEAR   Specific Gravity, Urine >1.030 (H) 1.005 - 1.030   pH 5.0 5.0 - 8.0   Glucose, UA NEGATIVE NEGATIVE mg/dL   Hgb urine dipstick NEGATIVE NEGATIVE   Bilirubin Urine NEGATIVE NEGATIVE   Ketones, ur TRACE (A) NEGATIVE mg/dL   Protein, ur NEGATIVE NEGATIVE mg/dL   Urobilinogen, UA 0.2 0.0 - 1.0 mg/dL   Nitrite NEGATIVE NEGATIVE   Leukocytes, UA NEGATIVE NEGATIVE    Comment: MICROSCOPIC NOT DONE ON URINES WITH NEGATIVE PROTEIN, BLOOD, LEUKOCYTES, NITRITE, OR GLUCOSE <1000 mg/dL.    ABGS  Recent Labs  12/17/14 1633  TCO2 26   CULTURES No results found for  this or any previous visit (from the past 240 hour(s)). Studies/Results: [name] Wo Contrast  12/18/2014  CLINICAL DATA:  64 year old male with left side weakness for 1 day. Code stroke. Initial encounter. EXAM: MRI HEAD WITHOUT CONTRAST MRA HEAD WITHOUT CONTRAST MRA NECK WITHOUT AND WITH CONTRAST TECHNIQUE: Multiplanar, multiecho pulse sequences of the brain and surrounding structures were obtained without and with intravenous contrast. Angiographic images of the Circle of Willis were obtained using MRA technique without intravenous contrast. Angiographic images of the neck were obtained using MRA technique without and with intravenous contrast. Carotid stenosis measurements (when applicable) are obtained utilizing NASCET criteria, using the distal internal carotid diameter as the denominator. CONTRAST:  22mL MULTIHANCE GADOBENATE DIMEGLUMINE 529 MG/ML IV SOLN COMPARISON:  Head CT without contrast 12/17/2014. Brain MRI 09/06/2014 and earlier. Intracranial MRA 06/20/2008. FINDINGS: MRI HEAD FINDINGS Cerebral volume is within normal limits for age. No restricted diffusion to suggest acute infarction. No midline shift, mass effect, evidence of mass lesion, ventriculomegaly, extra-axial collection or acute intracranial hemorrhage. Cervicomedullary junction and pituitary are within normal limits. Major intracranial vascular flow voids are preserved, dominant appearing distal right vertebral artery. Several small bilateral chronic cerebellar infarcts. No chronic cerebral blood products identified. Subtle, Small foci of cortically based encephalomalacia about the right motor strip near it the arm representation (series 6, images 20 and 19). These are chronic. Otherwise normal for age supratentorial gray and white matter signal. Visible internal auditory structures appear normal.  Mastoids are clear. Mild paranasal sinus mucosal thickening. Negative scalp and orbits soft tissues. Negative visualized cervical spine.  MRA NECK FINDINGS Precontrast time-of-flight images demonstrate antegrade flow in both carotid and vertebral arteries. Carotid bifurcations appear within normal limits. The right vertebral artery is dominant. Postcontrast images reveal a 3 vessel arch configuration. Mildly tortuous CCAs, more so the right. Widely patent right ICA origin and bulb. Mildly to moderately tortuous cervical right ICA otherwise is negative. Widely patent left ICA origin and bulb.  Negative cervical left ICA. Dominant right vertebral artery. The non dominant left vertebral artery origin is not well visualized. No vertebral artery stenosis identified. MRA HEAD FINDINGS Dominant distal right vertebral artery re- identified. Both distal vertebral arteries are patent without stenosis. The left PICA origin remains patent. Mild basilar artery tortuosity is stable, no basilar stenosis. SCA and PCA origins remain normal. Posterior communicating arteries are diminutive or absent. Bilateral PCA branches are stable and within normal limits. Stable antegrade flow in both ICA siphons. Ophthalmic artery origins are normal. Carotid termini, MCA and ACA origins remain normal. Visualized ACA branches are stable and within normal limits. Bilateral MCA M1 segments and MCA branches are stable and within normal limits. IMPRESSION: 1. No acute intracranial abnormality. Stable noncontrast MRI appearance of the brain with moderate chronic cerebellar small vessel ischemia. Suspect several chronic small vessel cortical infarcts about the right motor strip. 2. Negative neck MRA aside from vessel tortuosity. 3. Stable and negative intracranial MRA. Electronically Signed   By: Genevie Ann M.D.   On: 12/18/2014 09:56   Mr Angiogram Neck W Wo Contrast  12/18/2014  CLINICAL DATA:  64 year old male with left side weakness for 1 day. Code stroke. Initial encounter. EXAM: MRI HEAD WITHOUT CONTRAST MRA HEAD WITHOUT CONTRAST MRA NECK WITHOUT AND WITH CONTRAST TECHNIQUE:  Multiplanar, multiecho pulse sequences of the brain and surrounding structures were obtained without and with intravenous contrast. Angiographic images of the Circle of Willis were obtained using MRA technique without intravenous contrast. Angiographic images of the neck were obtained using MRA technique without and with intravenous contrast. Carotid stenosis measurements (when applicable) are obtained utilizing NASCET criteria, using the distal internal carotid diameter as the denominator. CONTRAST:  77mL MULTIHANCE GADOBENATE DIMEGLUMINE 529 MG/ML IV SOLN COMPARISON:  Head CT without contrast 12/17/2014. Brain MRI 09/06/2014 and earlier. Intracranial MRA 06/20/2008. FINDINGS: MRI HEAD FINDINGS Cerebral volume is within normal limits for age. No restricted diffusion to suggest acute infarction. No midline shift, mass effect, evidence of mass lesion, ventriculomegaly, extra-axial collection or acute intracranial hemorrhage. Cervicomedullary junction and pituitary are within normal limits. Major intracranial vascular flow voids are preserved, dominant appearing distal right vertebral artery. Several small bilateral chronic cerebellar infarcts. No chronic cerebral blood products identified. Subtle, Small foci of cortically based encephalomalacia about the right motor strip near it the arm representation (series 6, images 20 and 19). These are chronic. Otherwise normal for age supratentorial gray and white matter signal. Visible internal auditory structures appear normal. Mastoids are clear. Mild paranasal sinus mucosal thickening. Negative scalp and orbits soft tissues. Negative visualized cervical spine. MRA NECK FINDINGS Precontrast time-of-flight images demonstrate antegrade flow in both carotid and vertebral arteries. Carotid bifurcations appear within normal limits. The right vertebral artery is dominant. Postcontrast images reveal a 3 vessel arch configuration. Mildly tortuous CCAs, more so the right. Widely  patent right ICA origin and bulb. Mildly to moderately tortuous cervical right ICA otherwise is negative. Widely patent left ICA origin and bulb.  Negative cervical  left ICA. Dominant right vertebral artery. The non dominant left vertebral artery origin is not well visualized. No vertebral artery stenosis identified. MRA HEAD FINDINGS Dominant distal right vertebral artery re- identified. Both distal vertebral arteries are patent without stenosis. The left PICA origin remains patent. Mild basilar artery tortuosity is stable, no basilar stenosis. SCA and PCA origins remain normal. Posterior communicating arteries are diminutive or absent. Bilateral PCA branches are stable and within normal limits. Stable antegrade flow in both ICA siphons. Ophthalmic artery origins are normal. Carotid termini, MCA and ACA origins remain normal. Visualized ACA branches are stable and within normal limits. Bilateral MCA M1 segments and MCA branches are stable and within normal limits. IMPRESSION: 1. No acute intracranial abnormality. Stable noncontrast MRI appearance of the brain with moderate chronic cerebellar small vessel ischemia. Suspect several chronic small vessel cortical infarcts about the right motor strip. 2. Negative neck MRA aside from vessel tortuosity. 3. Stable and negative intracranial MRA. Electronically Signed   By: Genevie Ann M.D.   On: 12/18/2014 09:56   Mr Brain Wo Contrast  12/18/2014  CLINICAL DATA:  64 year old male with left side weakness for 1 day. Code stroke. Initial encounter. EXAM: MRI HEAD WITHOUT CONTRAST MRA HEAD WITHOUT CONTRAST MRA NECK WITHOUT AND WITH CONTRAST TECHNIQUE: Multiplanar, multiecho pulse sequences of the brain and surrounding structures were obtained without and with intravenous contrast. Angiographic images of the Circle of Willis were obtained using MRA technique without intravenous contrast. Angiographic images of the neck were obtained using MRA technique without and with  intravenous contrast. Carotid stenosis measurements (when applicable) are obtained utilizing NASCET criteria, using the distal internal carotid diameter as the denominator. CONTRAST:  15mL MULTIHANCE GADOBENATE DIMEGLUMINE 529 MG/ML IV SOLN COMPARISON:  Head CT without contrast 12/17/2014. Brain MRI 09/06/2014 and earlier. Intracranial MRA 06/20/2008. FINDINGS: MRI HEAD FINDINGS Cerebral volume is within normal limits for age. No restricted diffusion to suggest acute infarction. No midline shift, mass effect, evidence of mass lesion, ventriculomegaly, extra-axial collection or acute intracranial hemorrhage. Cervicomedullary junction and pituitary are within normal limits. Major intracranial vascular flow voids are preserved, dominant appearing distal right vertebral artery. Several small bilateral chronic cerebellar infarcts. No chronic cerebral blood products identified. Subtle, Small foci of cortically based encephalomalacia about the right motor strip near it the arm representation (series 6, images 20 and 19). These are chronic. Otherwise normal for age supratentorial gray and white matter signal. Visible internal auditory structures appear normal. Mastoids are clear. Mild paranasal sinus mucosal thickening. Negative scalp and orbits soft tissues. Negative visualized cervical spine. MRA NECK FINDINGS Precontrast time-of-flight images demonstrate antegrade flow in both carotid and vertebral arteries. Carotid bifurcations appear within normal limits. The right vertebral artery is dominant. Postcontrast images reveal a 3 vessel arch configuration. Mildly tortuous CCAs, more so the right. Widely patent right ICA origin and bulb. Mildly to moderately tortuous cervical right ICA otherwise is negative. Widely patent left ICA origin and bulb.  Negative cervical left ICA. Dominant right vertebral artery. The non dominant left vertebral artery origin is not well visualized. No vertebral artery stenosis identified. MRA  HEAD FINDINGS Dominant distal right vertebral artery re- identified. Both distal vertebral arteries are patent without stenosis. The left PICA origin remains patent. Mild basilar artery tortuosity is stable, no basilar stenosis. SCA and PCA origins remain normal. Posterior communicating arteries are diminutive or absent. Bilateral PCA branches are stable and within normal limits. Stable antegrade flow in both ICA siphons. Ophthalmic artery origins are normal. Carotid  termini, MCA and ACA origins remain normal. Visualized ACA branches are stable and within normal limits. Bilateral MCA M1 segments and MCA branches are stable and within normal limits. IMPRESSION: 1. No acute intracranial abnormality. Stable noncontrast MRI appearance of the brain with moderate chronic cerebellar small vessel ischemia. Suspect several chronic small vessel cortical infarcts about the right motor strip. 2. Negative neck MRA aside from vessel tortuosity. 3. Stable and negative intracranial MRA. Electronically Signed   By: Genevie Ann M.D.   On: 12/18/2014 09:56    Medications:  Prior to Admission:  Prescriptions prior to admission  Medication Sig Dispense Refill Last Dose  . acetaminophen (TYLENOL) 500 MG tablet Take 1,000 mg by mouth every 6 (six) hours as needed for mild pain or moderate pain. For pain   12/17/2014 at Unknown time  . ALPRAZolam (XANAX) 0.5 MG tablet Take 0.5 mg by mouth daily as needed for anxiety or sleep.    unknown  . aspirin 325 MG EC tablet Take 325 mg by mouth daily.   12/17/2014 at Unknown time  . calcium citrate-vitamin D (CITRACAL+D) 315-200 MG-UNIT per tablet Take 2 tablets by mouth daily.   12/17/2014 at Unknown time  . clopidogrel (PLAVIX) 75 MG tablet Take 75 mg by mouth daily.    12/17/2014 at Unknown time  . Cyanocobalamin (VITAMIN B 12 PO) Take 1 tablet by mouth daily.   12/17/2014 at Unknown time  . Multiple Vitamins-Minerals (CENTRUM SILVER ULTRA MENS PO) Take 1 tablet by mouth daily.    12/17/2014 at Unknown time  . Omega-3 Fatty Acids (FISH OIL) 1200 MG CAPS Take 1 capsule by mouth daily.    12/17/2014 at Unknown time  . thiamine (VITAMIN B-1) 100 MG tablet Take 100 mg by mouth daily.   12/17/2014 at Unknown time  . venlafaxine XR (EFFEXOR-XR) 37.5 MG 24 hr capsule Take 37.5 mg by mouth daily with breakfast.    12/17/2014 at Unknown time   Scheduled: . aspirin  325 mg Oral Daily  . calcium-vitamin D  2 tablet Oral Daily  . clopidogrel  75 mg Oral Daily  . enoxaparin (LOVENOX) injection  40 mg Subcutaneous Q24H  . omega-3 acid ethyl esters  1 g Oral BID  . thiamine  100 mg Oral Daily  . venlafaxine XR  37.5 mg Oral Q breakfast   Continuous:  TDH:[id number], ALPRAZolam, ondansetron (ZOFRAN) IV, senna-docusate  Assesment: He was thought to have an acute ischemic stroke but did not based on his MRI. He has had previous strokes. He has posttraumatic stress disorder which I think is about the same. He has hypertension which is pretty well controlled. He has hyperlipidemia but is not tolerant of statins. Principal Problem:   Acute ischemic stroke [facility]) Active Problems:   PTSD   Essential hypertension   PATENT FORAMEN OVALE   Altered mental status   Anxiety state   CVA (cerebral infarction)    Plan: I will discuss with Dr. Merlene Laughter. Potential discharge later today    LOS: 3 days   Stachia Slutsky L 12/20/2014, 9:25 AM

## 2014-12-20 NOTE — Progress Notes (Signed)
IV site removed, discharge instructions reviewed with patient and wife.  Verbalized understanding.  Left via wheelchair for discharge home in care of wife.  Stable at discharge.

## 2014-12-20 NOTE — Progress Notes (Signed)
Patient ID: [name], male   DOB: 28-Oct-1950, 64 y.o.   MRN: [id number]  Jose A. Merlene Laughter, MD     [email or web address]          [name] is an 64 y.o. male.   Assessment/Plan: Worsening left-sided weakness associated with confusion, altered mental status and headaches. Differential diagnosis complex migraine/migraine with aura versus seizures.  Other episodes of recurrent confusion due to medication effect.  Prior cerebral infarcts.  History of episodic migraines.  Hypertension.  RECOMMENDATION: Continue with dual antiplatelet agents. Trial of low dose topamax     Patient reports that he is doing about the same and probably improved in terms of his left-sided symptoms. He still has headaches. His headaches are more mild and this seems more consistent with his baseline headaches. There are holocephalic headaches.  GENERAL: He is in no acute distress.  HEENT: Supple. Atraumatic normocephalic.   ABDOMEN: soft  EXTREMITIES: No edema   BACK: Normal.  SKIN: Normal by inspection.   MENTAL STATUS: Alert and oriented. Language and cognition are generally intact. Judgment and insight normal. Speech appears to be mild to moderately dysarthric.  CRANIAL NERVES: Pupils are equal, round and reactive to light and accommodation; extra ocular movements are full, there is no significant nystagmus; visual fields are full; upper and lower facial muscles are normal in strength and symmetric, there is no flattening of the nasolabial folds; tongue is midline; uvula is midline; shoulder elevation is normal.  MOTOR: Normal tone, bulk and strength on the right; no pronator drift. Mild left upper extremity downwards drift.   COORDINATION: Left finger to nose is normal, right finger to nose is normal, No rest tremor; no intention tremor; no postural tremor; no bradykinesia.  REFLEXES: Deep tendon reflexes are symmetrical and normal. Babinski reflexes  are flexor bilaterally.   SENSATION: Impaired sensation to light touch and temperature left upper extremity and left leg.   EEG fine.   Objective: Vital signs in last 24 hours: Temp:  [99.7 F (37.6 C)-100.5 F (38.1 C)] 99.9 F (37.7 C) (11/02 0511) Pulse Rate:  [71-95] 71 (11/02 0511) Resp:  [20] 20 (11/02 0511) BP: (99-127)/(56-75) 114/64 mmHg (11/02 0511) SpO2:  [95 %-98 %] 95 % (11/02 0511)  Intake/Output from previous day: 11/01 0701 - 11/02 0700 In: 720 [P.O.:720] Out: 300 [Urine:300] Intake/Output this shift:   Nutritional status: Diet regular Room service appropriate?: Yes; Fluid consistency:: Thin   Lab Results: Results for orders placed or performed during the hospital encounter of 12/17/14 (from the past 48 hour(s))  Urinalysis, Routine w reflex microscopic (not at [facility])     Status: Abnormal   Collection Time: 12/18/14  5:15 PM  Result Value Ref Range   Color, Urine YELLOW YELLOW   APPearance CLEAR CLEAR   Specific Gravity, Urine >1.030 (H) 1.005 - 1.030   pH 5.0 5.0 - 8.0   Glucose, UA NEGATIVE NEGATIVE mg/dL   Hgb urine dipstick NEGATIVE NEGATIVE   Bilirubin Urine NEGATIVE NEGATIVE   Ketones, ur TRACE (A) NEGATIVE mg/dL   Protein, ur NEGATIVE NEGATIVE mg/dL   Urobilinogen, UA 0.2 0.0 - 1.0 mg/dL   Nitrite NEGATIVE NEGATIVE   Leukocytes, UA NEGATIVE NEGATIVE    Comment: MICROSCOPIC NOT DONE ON URINES WITH NEGATIVE PROTEIN, BLOOD, LEUKOCYTES, NITRITE, OR GLUCOSE <1000 mg/dL.    Lipid Panel  Recent Labs  12/18/14 0706  CHOL 233*  TRIG 202*  HDL 43  CHOLHDL 5.4  VLDL 40  LDLCALC 150*  Studies/Results: BRAIN MRI/MRA NECK MRA 1. No acute intracranial abnormality. Stable noncontrast MRI appearance of the brain with moderate chronic cerebellar small vessel ischemia. Suspect several chronic small vessel cortical infarcts about the right motor strip. 2. Negative neck MRA aside from vessel tortuosity. 3. Stable and negative intracranial  MRA   Medications:  Scheduled Meds: . aspirin  325 mg Oral Daily  . calcium-vitamin D  2 tablet Oral Daily  . clopidogrel  75 mg Oral Daily  . enoxaparin (LOVENOX) injection  40 mg Subcutaneous Q24H  . omega-3 acid ethyl esters  1 g Oral BID  . thiamine  100 mg Oral Daily  . venlafaxine XR  37.5 mg Oral Q breakfast   Continuous Infusions:  PRN Meds:.acetaminophen, ALPRAZolam, ondansetron (ZOFRAN) IV, senna-docusate     LOS: 3 days   [name], M.D.  Diplomate, [employer] adviser of Psychiatry and Neurology ( Neurology).

## 2014-12-21 LAB — PROTHROMBIN GENE MUTATION

## 2014-12-21 LAB — FACTOR 5 LEIDEN

## 2014-12-22 LAB — PROTEIN S ACTIVITY: Protein S Activity: 124 % (ref 63–140)

## 2014-12-22 LAB — LUPUS ANTICOAGULANT PANEL
DRVVT: 38.4 s (ref 0.0–44.0)
PTT Lupus Anticoagulant: 37 s (ref 0.0–40.6)

## 2014-12-22 LAB — PROTEIN C ACTIVITY: Protein C Activity: 96 % (ref 73–180)

## 2014-12-22 LAB — PROTEIN S, TOTAL: Protein S Ag, Total: 133 % (ref 60–150)

## 2014-12-24 NOTE — Discharge Summary (Signed)
Physician Discharge Summary  Patient ID: [name] MRN: [id number] DOB/AGE: 1950-12-30 64 y.o. Primary Care Physician:Jose Hasler L, MD Admit date: 12/17/2014 Discharge date: 12/24/2014    Discharge Diagnoses:   Principal Problem:   Acute ischemic stroke [facility]) Active Problems:   PTSD   Essential hypertension   PATENT FORAMEN OVALE   Altered mental status   Anxiety state   CVA (cerebral infarction)  acute ischemic stroke is not a current diagnosis    Medication List    TAKE these medications        acetaminophen 500 MG tablet  Commonly known as:  TYLENOL  Take 1,000 mg by mouth every 6 (six) hours as needed for mild pain or moderate pain. For pain     ALPRAZolam 0.5 MG tablet  Commonly known as:  XANAX  Take 0.5 mg by mouth daily as needed for anxiety or sleep.     aspirin 325 MG EC tablet  Take 325 mg by mouth daily.     calcium citrate-vitamin D 315-200 MG-UNIT tablet  Commonly known as:  CITRACAL+D  Take 2 tablets by mouth daily.     CENTRUM SILVER ULTRA MENS PO  Take 1 tablet by mouth daily.     clopidogrel 75 MG tablet  Commonly known as:  PLAVIX  Take 75 mg by mouth daily.     Fish Oil 1200 MG Caps  Take 1 capsule by mouth daily.     thiamine 100 MG tablet  Commonly known as:  VITAMIN B-1  Take 100 mg by mouth daily.     venlafaxine XR 37.5 MG 24 hr capsule  Commonly known as:  EFFEXOR-XR  Take 37.5 mg by mouth daily with breakfast.     VITAMIN B 12 PO  Take 1 tablet by mouth daily.        Discharged Condition: Improved    Consults: Neurology  Significant Diagnostic Studies: Ct Head Wo Contrast  12/17/2014  CLINICAL DATA:  Worsening left side weakness EXAM: CT HEAD WITHOUT CONTRAST TECHNIQUE: Contiguous axial images were obtained from the base of the skull through the vertex without intravenous contrast. COMPARISON:  09/10/13 FINDINGS: Calvarium is intact. No significant inflammatory change in the visualized portions of the  paranasal sinuses. Mild age-related atrophy. No evidence of cortical infarct or mass. No hydrocephalus. No hemorrhage or extra-axial fluid. IMPRESSION: Negative head CT. Critical Value/emergent results were called by telephone at the time of interpretation on 12/17/2014 at 4:47 pm to Dr. Elnora Morrison , who verbally acknowledged these results. Electronically Signed   By: Skipper Cliche M.D.   On: 12/17/2014 16:47   [name] Wo Contrast  12/18/2014  CLINICAL DATA:  64 year old male with left side weakness for 1 day. Code stroke. Initial encounter. EXAM: MRI HEAD WITHOUT CONTRAST MRA HEAD WITHOUT CONTRAST MRA NECK WITHOUT AND WITH CONTRAST TECHNIQUE: Multiplanar, multiecho pulse sequences of the brain and surrounding structures were obtained without and with intravenous contrast. Angiographic images of the Circle of Willis were obtained using MRA technique without intravenous contrast. Angiographic images of the neck were obtained using MRA technique without and with intravenous contrast. Carotid stenosis measurements (when applicable) are obtained utilizing NASCET criteria, using the distal internal carotid diameter as the denominator. CONTRAST:  44mL MULTIHANCE GADOBENATE DIMEGLUMINE 529 MG/ML IV SOLN COMPARISON:  Head CT without contrast 12/17/2014. Brain MRI 09/06/2014 and earlier. Intracranial MRA 06/20/2008. FINDINGS: MRI HEAD FINDINGS Cerebral volume is within normal limits for age. No restricted diffusion to suggest acute infarction. No midline  shift, mass effect, evidence of mass lesion, ventriculomegaly, extra-axial collection or acute intracranial hemorrhage. Cervicomedullary junction and pituitary are within normal limits. Major intracranial vascular flow voids are preserved, dominant appearing distal right vertebral artery. Several small bilateral chronic cerebellar infarcts. No chronic cerebral blood products identified. Subtle, Small foci of cortically based encephalomalacia about the right  motor strip near it the arm representation (series 6, images 20 and 19). These are chronic. Otherwise normal for age supratentorial gray and white matter signal. Visible internal auditory structures appear normal. Mastoids are clear. Mild paranasal sinus mucosal thickening. Negative scalp and orbits soft tissues. Negative visualized cervical spine. MRA NECK FINDINGS Precontrast time-of-flight images demonstrate antegrade flow in both carotid and vertebral arteries. Carotid bifurcations appear within normal limits. The right vertebral artery is dominant. Postcontrast images reveal a 3 vessel arch configuration. Mildly tortuous CCAs, more so the right. Widely patent right ICA origin and bulb. Mildly to moderately tortuous cervical right ICA otherwise is negative. Widely patent left ICA origin and bulb.  Negative cervical left ICA. Dominant right vertebral artery. The non dominant left vertebral artery origin is not well visualized. No vertebral artery stenosis identified. MRA HEAD FINDINGS Dominant distal right vertebral artery re- identified. Both distal vertebral arteries are patent without stenosis. The left PICA origin remains patent. Mild basilar artery tortuosity is stable, no basilar stenosis. SCA and PCA origins remain normal. Posterior communicating arteries are diminutive or absent. Bilateral PCA branches are stable and within normal limits. Stable antegrade flow in both ICA siphons. Ophthalmic artery origins are normal. Carotid termini, MCA and ACA origins remain normal. Visualized ACA branches are stable and within normal limits. Bilateral MCA M1 segments and MCA branches are stable and within normal limits. IMPRESSION: 1. No acute intracranial abnormality. Stable noncontrast MRI appearance of the brain with moderate chronic cerebellar small vessel ischemia. Suspect several chronic small vessel cortical infarcts about the right motor strip. 2. Negative neck MRA aside from vessel tortuosity. 3. Stable and  negative intracranial MRA. Electronically Signed   By: Genevie Ann M.D.   On: 12/18/2014 09:56   Mr Angiogram Neck W Wo Contrast  12/18/2014  CLINICAL DATA:  64 year old male with left side weakness for 1 day. Code stroke. Initial encounter. EXAM: MRI HEAD WITHOUT CONTRAST MRA HEAD WITHOUT CONTRAST MRA NECK WITHOUT AND WITH CONTRAST TECHNIQUE: Multiplanar, multiecho pulse sequences of the brain and surrounding structures were obtained without and with intravenous contrast. Angiographic images of the Circle of Willis were obtained using MRA technique without intravenous contrast. Angiographic images of the neck were obtained using MRA technique without and with intravenous contrast. Carotid stenosis measurements (when applicable) are obtained utilizing NASCET criteria, using the distal internal carotid diameter as the denominator. CONTRAST:  67mL MULTIHANCE GADOBENATE DIMEGLUMINE 529 MG/ML IV SOLN COMPARISON:  Head CT without contrast 12/17/2014. Brain MRI 09/06/2014 and earlier. Intracranial MRA 06/20/2008. FINDINGS: MRI HEAD FINDINGS Cerebral volume is within normal limits for age. No restricted diffusion to suggest acute infarction. No midline shift, mass effect, evidence of mass lesion, ventriculomegaly, extra-axial collection or acute intracranial hemorrhage. Cervicomedullary junction and pituitary are within normal limits. Major intracranial vascular flow voids are preserved, dominant appearing distal right vertebral artery. Several small bilateral chronic cerebellar infarcts. No chronic cerebral blood products identified. Subtle, Small foci of cortically based encephalomalacia about the right motor strip near it the arm representation (series 6, images 20 and 19). These are chronic. Otherwise normal for age supratentorial gray and white matter signal. Visible internal auditory structures appear  normal. Mastoids are clear. Mild paranasal sinus mucosal thickening. Negative scalp and orbits soft tissues. Negative  visualized cervical spine. MRA NECK FINDINGS Precontrast time-of-flight images demonstrate antegrade flow in both carotid and vertebral arteries. Carotid bifurcations appear within normal limits. The right vertebral artery is dominant. Postcontrast images reveal a 3 vessel arch configuration. Mildly tortuous CCAs, more so the right. Widely patent right ICA origin and bulb. Mildly to moderately tortuous cervical right ICA otherwise is negative. Widely patent left ICA origin and bulb.  Negative cervical left ICA. Dominant right vertebral artery. The non dominant left vertebral artery origin is not well visualized. No vertebral artery stenosis identified. MRA HEAD FINDINGS Dominant distal right vertebral artery re- identified. Both distal vertebral arteries are patent without stenosis. The left PICA origin remains patent. Mild basilar artery tortuosity is stable, no basilar stenosis. SCA and PCA origins remain normal. Posterior communicating arteries are diminutive or absent. Bilateral PCA branches are stable and within normal limits. Stable antegrade flow in both ICA siphons. Ophthalmic artery origins are normal. Carotid termini, MCA and ACA origins remain normal. Visualized ACA branches are stable and within normal limits. Bilateral MCA M1 segments and MCA branches are stable and within normal limits. IMPRESSION: 1. No acute intracranial abnormality. Stable noncontrast MRI appearance of the brain with moderate chronic cerebellar small vessel ischemia. Suspect several chronic small vessel cortical infarcts about the right motor strip. 2. Negative neck MRA aside from vessel tortuosity. 3. Stable and negative intracranial MRA. Electronically Signed   By: Genevie Ann M.D.   On: 12/18/2014 09:56   Mr Brain Wo Contrast  12/18/2014  CLINICAL DATA:  64 year old male with left side weakness for 1 day. Code stroke. Initial encounter. EXAM: MRI HEAD WITHOUT CONTRAST MRA HEAD WITHOUT CONTRAST MRA NECK WITHOUT AND WITH CONTRAST  TECHNIQUE: Multiplanar, multiecho pulse sequences of the brain and surrounding structures were obtained without and with intravenous contrast. Angiographic images of the Circle of Willis were obtained using MRA technique without intravenous contrast. Angiographic images of the neck were obtained using MRA technique without and with intravenous contrast. Carotid stenosis measurements (when applicable) are obtained utilizing NASCET criteria, using the distal internal carotid diameter as the denominator. CONTRAST:  78mL MULTIHANCE GADOBENATE DIMEGLUMINE 529 MG/ML IV SOLN COMPARISON:  Head CT without contrast 12/17/2014. Brain MRI 09/06/2014 and earlier. Intracranial MRA 06/20/2008. FINDINGS: MRI HEAD FINDINGS Cerebral volume is within normal limits for age. No restricted diffusion to suggest acute infarction. No midline shift, mass effect, evidence of mass lesion, ventriculomegaly, extra-axial collection or acute intracranial hemorrhage. Cervicomedullary junction and pituitary are within normal limits. Major intracranial vascular flow voids are preserved, dominant appearing distal right vertebral artery. Several small bilateral chronic cerebellar infarcts. No chronic cerebral blood products identified. Subtle, Small foci of cortically based encephalomalacia about the right motor strip near it the arm representation (series 6, images 20 and 19). These are chronic. Otherwise normal for age supratentorial gray and white matter signal. Visible internal auditory structures appear normal. Mastoids are clear. Mild paranasal sinus mucosal thickening. Negative scalp and orbits soft tissues. Negative visualized cervical spine. MRA NECK FINDINGS Precontrast time-of-flight images demonstrate antegrade flow in both carotid and vertebral arteries. Carotid bifurcations appear within normal limits. The right vertebral artery is dominant. Postcontrast images reveal a 3 vessel arch configuration. Mildly tortuous CCAs, more so the right.  Widely patent right ICA origin and bulb. Mildly to moderately tortuous cervical right ICA otherwise is negative. Widely patent left ICA origin and bulb.  Negative cervical left  ICA. Dominant right vertebral artery. The non dominant left vertebral artery origin is not well visualized. No vertebral artery stenosis identified. MRA HEAD FINDINGS Dominant distal right vertebral artery re- identified. Both distal vertebral arteries are patent without stenosis. The left PICA origin remains patent. Mild basilar artery tortuosity is stable, no basilar stenosis. SCA and PCA origins remain normal. Posterior communicating arteries are diminutive or absent. Bilateral PCA branches are stable and within normal limits. Stable antegrade flow in both ICA siphons. Ophthalmic artery origins are normal. Carotid termini, MCA and ACA origins remain normal. Visualized ACA branches are stable and within normal limits. Bilateral MCA M1 segments and MCA branches are stable and within normal limits. IMPRESSION: 1. No acute intracranial abnormality. Stable noncontrast MRI appearance of the brain with moderate chronic cerebellar small vessel ischemia. Suspect several chronic small vessel cortical infarcts about the right motor strip. 2. Negative neck MRA aside from vessel tortuosity. 3. Stable and negative intracranial MRA. Electronically Signed   By: Genevie Ann M.D.   On: 12/18/2014 09:56   US Soft Tissue Head/neck  12/14/2014  CLINICAL DATA:  Thyroid nodule EXAM: THYROID ULTRASOUND TECHNIQUE: Ultrasound examination of the thyroid gland and adjacent soft tissues was performed. COMPARISON:  01/05/2014 FINDINGS: Right thyroid lobe Measurements: 5.1 x 2.1 x 1.9 cm. Multiple nodules are noted on the right. The largest of these measures 6 mm in dimension. There stable in appearance from the prior exam. Left thyroid lobe Measurements: 4.5 x 1.6 x 1.7 cm. Small less than 5 mm nodule is noted in the midportion of left lobe. Isthmus Thickness: 4 mm.   No nodules visualized. Lymphadenopathy None visualized. IMPRESSION: Bilateral stable thyroid nodules. Findings do not meet current SRU consensus criteria for biopsy. Follow-up by clinical exam is recommended. If patient has known risk factors for thyroid carcinoma, consider follow-up ultrasound in 12 months. If patient is clinically hyperthyroid, consider nuclear medicine thyroid uptake and scan.Reference: Management of Thyroid Nodules Detected at [place]: Society of Radiologists in [place]. Radiology 2005; [id number]. Electronically Signed   By: Inez Catalina M.D.   On: 12/14/2014 16:01    Lab Results: Basic Metabolic Panel: No results for input(s): NA, K, CL, CO2, GLUCOSE, BUN, CREATININE, CALCIUM, MG, PHOS in the last 72 hours. Liver Function Tests: No results for input(s): AST, ALT, ALKPHOS, BILITOT, PROT, ALBUMIN in the last 72 hours.   CBC: No results for input(s): WBC, NEUTROABS, HGB, HCT, MCV, PLT in the last 72 hours.  No results found for this or any previous visit (from the past 240 hour(s)).   Hospital Course: This is a 64 year old who came to the emergency department because of weakness on the left side. There was concern that he had an acute ischemic stroke but this was ruled out with his MRI scan. There was concern that this might have been a seizure but his EEG was normal. He was back to baseline by the time of discharge. I don't think it's entirely clear exactly what happened. Dr. Merlene Laughter the neurologist suggested that we put him on Topamax but when I discussed that with the patient and his wife there were both concerned about adding a medication since he is just starting on a new medication for depression and posttraumatic stress disorder. They want to hold off on that until he goes back to his psychiatrist.  Discharge Exam: Blood pressure 99/60, pulse 65, temperature 98.2 F (36.8 C), temperature source Oral, resp. rate 20, height 6\' 1"  (1.854 m),  weight 105.325  kg (232 lb 3.2 oz), SpO2 96 %. He is awake and alert. He still has some weakness on the left which is chronic  Disposition: Home. He will start Topamax after he gets his medications for depression and posttraumatic stress disorder adjusted      Discharge Instructions    Discharge patient    Complete by:  As directed              Signed: Guenevere Roorda Johnson   12/24/2014, 9:45 AM

## 2015-01-03 DIAGNOSIS — F4310 Post-traumatic stress disorder, unspecified: Secondary | ICD-10-CM | POA: Diagnosis not present

## 2015-01-22 DIAGNOSIS — G8929 Other chronic pain: Secondary | ICD-10-CM | POA: Diagnosis not present

## 2015-01-22 DIAGNOSIS — F419 Anxiety disorder, unspecified: Secondary | ICD-10-CM | POA: Diagnosis not present

## 2015-01-22 DIAGNOSIS — F4312 Post-traumatic stress disorder, chronic: Secondary | ICD-10-CM | POA: Diagnosis not present

## 2015-01-25 DIAGNOSIS — H40013 Open angle with borderline findings, low risk, bilateral: Secondary | ICD-10-CM | POA: Diagnosis not present

## 2015-01-25 DIAGNOSIS — H53453 Other localized visual field defect, bilateral: Secondary | ICD-10-CM | POA: Diagnosis not present

## 2015-01-25 DIAGNOSIS — H25813 Combined forms of age-related cataract, bilateral: Secondary | ICD-10-CM | POA: Diagnosis not present

## 2015-01-30 DIAGNOSIS — I6990 Unspecified sequelae of unspecified cerebrovascular disease: Secondary | ICD-10-CM | POA: Diagnosis not present

## 2015-01-30 DIAGNOSIS — F482 Pseudobulbar affect: Secondary | ICD-10-CM | POA: Diagnosis not present

## 2015-01-30 DIAGNOSIS — E785 Hyperlipidemia, unspecified: Secondary | ICD-10-CM | POA: Diagnosis not present

## 2015-01-30 DIAGNOSIS — F4312 Post-traumatic stress disorder, chronic: Secondary | ICD-10-CM | POA: Diagnosis not present

## 2015-01-30 DIAGNOSIS — F329 Major depressive disorder, single episode, unspecified: Secondary | ICD-10-CM | POA: Diagnosis not present

## 2015-01-30 DIAGNOSIS — G3184 Mild cognitive impairment, so stated: Secondary | ICD-10-CM | POA: Diagnosis not present

## 2015-01-30 DIAGNOSIS — G44201 Tension-type headache, unspecified, intractable: Secondary | ICD-10-CM | POA: Diagnosis not present

## 2015-01-31 DIAGNOSIS — F4310 Post-traumatic stress disorder, unspecified: Secondary | ICD-10-CM | POA: Diagnosis not present

## 2015-03-01 ENCOUNTER — Emergency Department (HOSPITAL_COMMUNITY): Payer: Medicare Other

## 2015-03-01 ENCOUNTER — Encounter (HOSPITAL_COMMUNITY): Payer: Self-pay | Admitting: Emergency Medicine

## 2015-03-01 ENCOUNTER — Observation Stay (HOSPITAL_COMMUNITY)
Admission: EM | Admit: 2015-03-01 | Discharge: 2015-03-02 | Disposition: A | Discharge status: Home / Self Care | Payer: Medicare Other | Attending: Pulmonary Disease | Admitting: Pulmonary Disease

## 2015-03-01 DIAGNOSIS — R4182 Altered mental status, unspecified: Secondary | ICD-10-CM | POA: Diagnosis present

## 2015-03-01 DIAGNOSIS — F1190 Opioid use, unspecified, uncomplicated: Secondary | ICD-10-CM

## 2015-03-01 DIAGNOSIS — F1110 Opioid abuse, uncomplicated: Secondary | ICD-10-CM | POA: Diagnosis not present

## 2015-03-01 DIAGNOSIS — F482 Pseudobulbar affect: Secondary | ICD-10-CM | POA: Insufficient documentation

## 2015-03-01 DIAGNOSIS — R410 Disorientation, unspecified: Secondary | ICD-10-CM | POA: Diagnosis not present

## 2015-03-01 DIAGNOSIS — K922 Gastrointestinal hemorrhage, unspecified: Secondary | ICD-10-CM | POA: Insufficient documentation

## 2015-03-01 DIAGNOSIS — G8929 Other chronic pain: Secondary | ICD-10-CM | POA: Insufficient documentation

## 2015-03-01 DIAGNOSIS — F329 Major depressive disorder, single episode, unspecified: Secondary | ICD-10-CM | POA: Diagnosis not present

## 2015-03-01 DIAGNOSIS — F4310 Post-traumatic stress disorder, unspecified: Secondary | ICD-10-CM | POA: Diagnosis not present

## 2015-03-01 DIAGNOSIS — Z7902 Long term (current) use of antithrombotics/antiplatelets: Secondary | ICD-10-CM | POA: Insufficient documentation

## 2015-03-01 DIAGNOSIS — R296 Repeated falls: Secondary | ICD-10-CM | POA: Diagnosis not present

## 2015-03-01 DIAGNOSIS — F419 Anxiety disorder, unspecified: Secondary | ICD-10-CM | POA: Diagnosis not present

## 2015-03-01 DIAGNOSIS — Z8673 Personal history of transient ischemic attack (TIA), and cerebral infarction without residual deficits: Secondary | ICD-10-CM | POA: Insufficient documentation

## 2015-03-01 DIAGNOSIS — K222 Esophageal obstruction: Secondary | ICD-10-CM | POA: Insufficient documentation

## 2015-03-01 DIAGNOSIS — Z79899 Other long term (current) drug therapy: Secondary | ICD-10-CM

## 2015-03-01 DIAGNOSIS — Z87891 Personal history of nicotine dependence: Secondary | ICD-10-CM | POA: Diagnosis not present

## 2015-03-01 DIAGNOSIS — Q211 Atrial septal defect: Secondary | ICD-10-CM | POA: Insufficient documentation

## 2015-03-01 DIAGNOSIS — G4730 Sleep apnea, unspecified: Secondary | ICD-10-CM | POA: Insufficient documentation

## 2015-03-01 DIAGNOSIS — F1510 Other stimulant abuse, uncomplicated: Secondary | ICD-10-CM | POA: Insufficient documentation

## 2015-03-01 DIAGNOSIS — W19XXXA Unspecified fall, initial encounter: Secondary | ICD-10-CM | POA: Diagnosis present

## 2015-03-01 DIAGNOSIS — M1990 Unspecified osteoarthritis, unspecified site: Secondary | ICD-10-CM | POA: Insufficient documentation

## 2015-03-01 DIAGNOSIS — R531 Weakness: Secondary | ICD-10-CM | POA: Insufficient documentation

## 2015-03-01 DIAGNOSIS — I10 Essential (primary) hypertension: Secondary | ICD-10-CM | POA: Diagnosis present

## 2015-03-01 DIAGNOSIS — I639 Cerebral infarction, unspecified: Secondary | ICD-10-CM

## 2015-03-01 DIAGNOSIS — Z7982 Long term (current) use of aspirin: Secondary | ICD-10-CM | POA: Diagnosis not present

## 2015-03-01 DIAGNOSIS — F1010 Alcohol abuse, uncomplicated: Secondary | ICD-10-CM | POA: Insufficient documentation

## 2015-03-01 LAB — PROTIME-INR
INR: 1.1 (ref 0.00–1.49)
Prothrombin Time: 14.4 seconds (ref 11.6–15.2)

## 2015-03-01 LAB — URINALYSIS, ROUTINE W REFLEX MICROSCOPIC
Bilirubin Urine: NEGATIVE
Glucose, UA: NEGATIVE mg/dL
Hgb urine dipstick: NEGATIVE
Leukocytes, UA: NEGATIVE
Nitrite: NEGATIVE
Protein, ur: NEGATIVE mg/dL
Specific Gravity, Urine: >1.03 — ABNORMAL HIGH (ref 1.005–1.030)
pH: 5.5 (ref 5.0–8.0)

## 2015-03-01 LAB — CBC WITH DIFFERENTIAL/PLATELET
Basophils Absolute: 0.1 10*3/uL (ref 0.0–0.1)
Basophils Relative: 1 %
Eosinophils Absolute: 0.2 10*3/uL (ref 0.0–0.7)
Eosinophils Relative: 4 %
HCT: 40.8 % (ref 39.0–52.0)
Hemoglobin: 13.7 g/dL (ref 13.0–17.0)
Lymphocytes Relative: 31 %
Lymphs Abs: 1.7 10*3/uL (ref 0.7–4.0)
MCH: 31.9 pg (ref 26.0–34.0)
MCHC: 33.6 g/dL (ref 30.0–36.0)
MCV: 94.9 fL (ref 78.0–100.0)
Monocytes Absolute: 0.6 10*3/uL (ref 0.1–1.0)
Monocytes Relative: 11 %
Neutro Abs: 2.9 10*3/uL (ref 1.7–7.7)
Neutrophils Relative %: 53 %
Platelets: 183 10*3/uL (ref 150–400)
RBC: 4.3 MIL/uL (ref 4.22–5.81)
RDW: 12.5 % (ref 11.5–15.5)
WBC: 5.5 10*3/uL (ref 4.0–10.5)

## 2015-03-01 LAB — COMPREHENSIVE METABOLIC PANEL
ALT: 23 U/L (ref 17–63)
AST: 20 U/L (ref 15–41)
Albumin: 4.2 g/dL (ref 3.5–5.0)
Alkaline Phosphatase: 58 U/L (ref 38–126)
Anion gap: 9 (ref 5–15)
BUN: 27 mg/dL — ABNORMAL HIGH (ref 6–20)
CO2: 25 mmol/L (ref 22–32)
Calcium: 8.9 mg/dL (ref 8.9–10.3)
Chloride: 104 mmol/L (ref 101–111)
Creatinine, Ser: 1.03 mg/dL (ref 0.61–1.24)
GFR calc Af Amer: >60 mL/min (ref >60)
GFR calc non Af Amer: >60 mL/min (ref >60)
Glucose, Bld: 95 mg/dL (ref 65–99)
Potassium: 3.8 mmol/L (ref 3.5–5.1)
Sodium: 138 mmol/L (ref 135–145)
Total Bilirubin: 1.2 mg/dL (ref 0.3–1.2)
Total Protein: 6.7 g/dL (ref 6.5–8.1)

## 2015-03-01 LAB — LACTIC ACID, PLASMA: Lactic Acid, Venous: 1.4 mmol/L (ref 0.5–2.0)

## 2015-03-01 LAB — RAPID URINE DRUG SCREEN, HOSP PERFORMED
Amphetamines: NOT DETECTED
Barbiturates: NOT DETECTED
Benzodiazepines: POSITIVE — AB
Cocaine: NOT DETECTED
Opiates: POSITIVE — AB
Tetrahydrocannabinol: NOT DETECTED

## 2015-03-01 LAB — SALICYLATE LEVEL: Salicylate Lvl: <4 mg/dL (ref 2.8–30.0)

## 2015-03-01 LAB — ETHANOL: Alcohol, Ethyl (B): 60 mg/dL — ABNORMAL HIGH (ref <5)

## 2015-03-01 LAB — ACETAMINOPHEN LEVEL: Acetaminophen (Tylenol), Serum: <10 ug/mL — ABNORMAL LOW (ref 10–30)

## 2015-03-01 LAB — TROPONIN I: Troponin I: <0.03 ng/mL (ref <0.031)

## 2015-03-01 MED ORDER — SODIUM CHLORIDE 0.9 % IV SOLN
INTRAVENOUS | Status: DC
  Administered 2015-03-01: 23:00:00 via INTRAVENOUS

## 2015-03-01 NOTE — ED Notes (Signed)
Dr Mcmanus at bedside,  

## 2015-03-01 NOTE — ED Notes (Signed)
Pt states that he is scared of what is going to happen to his spouse due to her medical issues and that is what has been happening lately.

## 2015-03-01 NOTE — ED Notes (Signed)
Pt has had 5 falls in 3 days, pt is has been more confused since falling back hitting head.  Pt has been taking large amounts or xanax and drinking large amounts alcohol. Wife had to call officers last night, pt was angry trying to leave the home, intoxicated. Pt thinks that was tonight the the officers was in his home.  Pt does not remember drinking more than one drink, per wife pt has drank 8oz in last two hours. Pt is depressed, she thinks he is self medication and she is very concerned.  She said when he drinks beer or wine he is fine, but when he drink vodka he become violent .

## 2015-03-01 NOTE — ED Notes (Signed)
Dr Lama at bedside,  

## 2015-03-01 NOTE — ED Notes (Signed)
Pt presents to er with spouse for further evaluation of abuse of alcohol and alprazolam, wife is concerned that pt may has been drinking vodka heavily over the past few days, has had multiple falls, pt reports that the falls are due to his left leg "giving out" due to hx of previous stroke. Wife is also concerned that pt may be taking too many alprazolam, pt has prescription for prn use, wife states that she went to count the number of pills and the count was off for the number pt reported that he had been taking, when she went to look at the bottle again the bottle was missing Pt is able to state that he is at a hospital, unsure of which one, weakness noted to left arm and left leg, pt reports that it started a few weeks ago when asked the first time, when asked again when it started pt states that it has been weak since his previous stroke last year, pt also has decreased sensation on left side, wife states that pt has been having a hard time forming his words but reports that it has progressively gotten worse sine last year.

## 2015-03-01 NOTE — ED Notes (Signed)
Pt has fell face down and straight back hitting head. Pt is very worried about his wife, she has some new dx he is concerned about.

## 2015-03-01 NOTE — ED Notes (Signed)
Pt states that he was straw precautions from his previous stroke, denies having any problems eating or drinking, reports that he eats anything he wants without problems, denies any changes, Dr Thurnell Garbe advised of pt's previous precautions,

## 2015-03-02 ENCOUNTER — Observation Stay (HOSPITAL_COMMUNITY): Payer: Medicare Other

## 2015-03-02 ENCOUNTER — Encounter (HOSPITAL_COMMUNITY): Payer: Self-pay | Admitting: Emergency Medicine

## 2015-03-02 DIAGNOSIS — F4489 Other dissociative and conversion disorders: Secondary | ICD-10-CM | POA: Diagnosis not present

## 2015-03-02 DIAGNOSIS — F1010 Alcohol abuse, uncomplicated: Secondary | ICD-10-CM

## 2015-03-02 DIAGNOSIS — W19XXXA Unspecified fall, initial encounter: Secondary | ICD-10-CM | POA: Diagnosis not present

## 2015-03-02 DIAGNOSIS — I10 Essential (primary) hypertension: Secondary | ICD-10-CM

## 2015-03-02 DIAGNOSIS — R410 Disorientation, unspecified: Secondary | ICD-10-CM | POA: Diagnosis present

## 2015-03-02 DIAGNOSIS — I639 Cerebral infarction, unspecified: Secondary | ICD-10-CM | POA: Diagnosis not present

## 2015-03-02 DIAGNOSIS — R296 Repeated falls: Secondary | ICD-10-CM | POA: Insufficient documentation

## 2015-03-02 DIAGNOSIS — R4182 Altered mental status, unspecified: Secondary | ICD-10-CM | POA: Diagnosis not present

## 2015-03-02 LAB — CBC
HCT: 40.4 % (ref 39.0–52.0)
Hemoglobin: 13.3 g/dL (ref 13.0–17.0)
MCH: 31.8 pg (ref 26.0–34.0)
MCHC: 32.9 g/dL (ref 30.0–36.0)
MCV: 96.7 fL (ref 78.0–100.0)
Platelets: 184 10*3/uL (ref 150–400)
RBC: 4.18 MIL/uL — ABNORMAL LOW (ref 4.22–5.81)
RDW: 12.5 % (ref 11.5–15.5)
WBC: 7.2 10*3/uL (ref 4.0–10.5)

## 2015-03-02 LAB — COMPREHENSIVE METABOLIC PANEL
ALT: 22 U/L (ref 17–63)
AST: 17 U/L (ref 15–41)
Albumin: 3.7 g/dL (ref 3.5–5.0)
Alkaline Phosphatase: 54 U/L (ref 38–126)
Anion gap: 6 (ref 5–15)
BUN: 25 mg/dL — ABNORMAL HIGH (ref 6–20)
CO2: 31 mmol/L (ref 22–32)
Calcium: 8.6 mg/dL — ABNORMAL LOW (ref 8.9–10.3)
Chloride: 106 mmol/L (ref 101–111)
Creatinine, Ser: 0.86 mg/dL (ref 0.61–1.24)
GFR calc Af Amer: >60 mL/min (ref >60)
GFR calc non Af Amer: >60 mL/min (ref >60)
Glucose, Bld: 84 mg/dL (ref 65–99)
Potassium: 4.2 mmol/L (ref 3.5–5.1)
Sodium: 143 mmol/L (ref 135–145)
Total Bilirubin: 1.2 mg/dL (ref 0.3–1.2)
Total Protein: 6 g/dL — ABNORMAL LOW (ref 6.5–8.1)

## 2015-03-02 LAB — LACTIC ACID, PLASMA: Lactic Acid, Venous: 1.3 mmol/L (ref 0.5–2.0)

## 2015-03-02 MED ORDER — ACETAMINOPHEN 500 MG PO TABS: 1000.0000 mg | ORAL_TABLET | Freq: Four times a day (QID) | ORAL | Status: DC | PRN

## 2015-03-02 MED ORDER — ONDANSETRON HCL 4 MG/2ML IJ SOLN: 4.0000 mg | Freq: Four times a day (QID) | INTRAMUSCULAR | Status: DC | PRN

## 2015-03-02 MED ORDER — THIAMINE HCL 100 MG/ML IJ SOLN
100.0000 mg | Freq: Every day | INTRAMUSCULAR | Status: DC
  Filled 2015-03-02: qty 2

## 2015-03-02 MED ORDER — ASPIRIN EC 325 MG PO TBEC
325.0000 mg | DELAYED_RELEASE_TABLET | Freq: Every day | ORAL | Status: DC
  Administered 2015-03-02: 325 mg via ORAL
  Filled 2015-03-02: qty 1

## 2015-03-02 MED ORDER — ADULT MULTIVITAMIN W/MINERALS CH
1.0000 | ORAL_TABLET | Freq: Every day | ORAL | Status: DC
  Administered 2015-03-02: 1 via ORAL
  Filled 2015-03-02: qty 1

## 2015-03-02 MED ORDER — CLOPIDOGREL BISULFATE 75 MG PO TABS
75.0000 mg | ORAL_TABLET | Freq: Every day | ORAL | Status: DC
  Administered 2015-03-02: 75 mg via ORAL
  Filled 2015-03-02: qty 1

## 2015-03-02 MED ORDER — LORAZEPAM 1 MG PO TABS: 0.0000 mg | ORAL_TABLET | Freq: Two times a day (BID) | ORAL | Status: DC

## 2015-03-02 MED ORDER — VITAMIN B-1 100 MG PO TABS
100.0000 mg | ORAL_TABLET | Freq: Every day | ORAL | Status: DC
  Administered 2015-03-02: 100 mg via ORAL
  Filled 2015-03-02: qty 1

## 2015-03-02 MED ORDER — LORAZEPAM 1 MG PO TABS: 0.0000 mg | ORAL_TABLET | Freq: Four times a day (QID) | ORAL | Status: DC

## 2015-03-02 MED ORDER — VITAMIN B-1 100 MG PO TABS
100.0000 mg | ORAL_TABLET | Freq: Every day | ORAL | Status: DC
  Filled 2015-03-02: qty 1

## 2015-03-02 MED ORDER — ENOXAPARIN SODIUM 40 MG/0.4ML ~~LOC~~ SOLN: 40.0000 mg | SUBCUTANEOUS | Status: DC

## 2015-03-02 MED ORDER — FOLIC ACID 1 MG PO TABS
1.0000 mg | ORAL_TABLET | Freq: Every day | ORAL | Status: DC
  Administered 2015-03-02: 1 mg via ORAL
  Filled 2015-03-02: qty 1

## 2015-03-02 MED ORDER — VENLAFAXINE HCL ER 75 MG PO CP24
150.0000 mg | ORAL_CAPSULE | Freq: Every day | ORAL | Status: DC
  Administered 2015-03-02: 150 mg via ORAL
  Filled 2015-03-02: qty 2

## 2015-03-02 MED ORDER — LORAZEPAM 2 MG/ML IJ SOLN: 1.0000 mg | Freq: Four times a day (QID) | INTRAMUSCULAR | Status: DC | PRN

## 2015-03-02 MED ORDER — ONDANSETRON HCL 4 MG PO TABS: 4.0000 mg | ORAL_TABLET | Freq: Four times a day (QID) | ORAL | Status: DC | PRN

## 2015-03-02 MED ORDER — LORAZEPAM 1 MG PO TABS: 1.0000 mg | ORAL_TABLET | Freq: Four times a day (QID) | ORAL | Status: DC | PRN

## 2015-03-02 NOTE — Progress Notes (Signed)
Subjective: Patient came with complaint of recurrent fall and change in mental status. Patient feels better today. He is scheduled for MRI.  Objective: Vital signs in last 24 hours: Temp:  [97.7 F (36.5 C)-99.3 F (37.4 C)] 98 F (36.7 C) (01/13 0513) Pulse Rate:  [55-67] 55 (01/13 0513) Resp:  [11-18] 18 (01/13 0513) BP: (115-134)/(65-87) 131/81 mmHg (01/13 0513) SpO2:  [89 %-97 %] 96 % (01/13 0513) Weight:  [104.327 kg (230 lb)-107.639 kg (237 lb 4.8 oz)] 107.502 kg (237 lb) (01/13 0757) Weight change:  Last BM Date: 03/01/15  Intake/Output from previous day:    PHYSICAL EXAM General appearance: alert and no distress Resp: clear to auscultation bilaterally Cardio: S1, S2 normal GI: soft, non-tender; bowel sounds normal; no masses,  no organomegaly Extremities: extremities normal, atraumatic, no cyanosis or edema  Lab Results:  Results for orders placed or performed during the hospital encounter of 03/01/15 (from the past 48 hour(s))  Acetaminophen level     Status: Abnormal   Collection Time: 03/01/15  9:37 PM  Result Value Ref Range   Acetaminophen (Tylenol), Serum <10 (L) 10 - 30 ug/mL    Comment:        THERAPEUTIC CONCENTRATIONS VARY SIGNIFICANTLY. A RANGE OF 10-30 ug/mL MAY BE AN EFFECTIVE CONCENTRATION FOR MANY PATIENTS. HOWEVER, SOME ARE BEST TREATED AT CONCENTRATIONS OUTSIDE THIS RANGE. ACETAMINOPHEN CONCENTRATIONS >150 ug/mL AT 4 HOURS AFTER INGESTION AND >50 ug/mL AT 12 HOURS AFTER INGESTION ARE OFTEN ASSOCIATED WITH TOXIC REACTIONS.   Comprehensive metabolic panel     Status: Abnormal   Collection Time: 03/01/15  9:37 PM  Result Value Ref Range   Sodium 138 135 - 145 mmol/L   Potassium 3.8 3.5 - 5.1 mmol/L   Chloride 104 101 - 111 mmol/L   CO2 25 22 - 32 mmol/L   Glucose, Bld 95 65 - 99 mg/dL   BUN 27 (H) 6 - 20 mg/dL   Creatinine, Ser 1.03 0.61 - 1.24 mg/dL   Calcium 8.9 8.9 - 10.3 mg/dL   Total Protein 6.7 6.5 - 8.1 g/dL   Albumin 4.2 3.5 -  5.0 g/dL   AST 20 15 - 41 U/L   ALT 23 17 - 63 U/L   Alkaline Phosphatase 58 38 - 126 U/L   Total Bilirubin 1.2 0.3 - 1.2 mg/dL   GFR calc non Af Amer >60 >60 mL/min   GFR calc Af Amer >60 >60 mL/min    Comment: (NOTE) The eGFR has been calculated using the CKD EPI equation. This calculation has not been validated in all clinical situations. eGFR's persistently <60 mL/min signify possible Chronic Kidney Disease.    Anion gap 9 5 - 15  Ethanol     Status: Abnormal   Collection Time: 03/01/15  9:37 PM  Result Value Ref Range   Alcohol, Ethyl (B) 60 (H) <5 mg/dL    Comment:        LOWEST DETECTABLE LIMIT FOR SERUM ALCOHOL IS 5 mg/dL FOR MEDICAL PURPOSES ONLY   Troponin I     Status: None   Collection Time: 03/01/15  9:37 PM  Result Value Ref Range   Troponin I <0.03 <0.031 ng/mL    Comment:        NO INDICATION OF MYOCARDIAL INJURY.   Salicylate level     Status: None   Collection Time: 03/01/15  9:37 PM  Result Value Ref Range   Salicylate Lvl <3.4 2.8 - 30.0 mg/dL  Lactic acid, plasma  Status: None   Collection Time: 03/01/15  9:37 PM  Result Value Ref Range   Lactic Acid, Venous 1.4 0.5 - 2.0 mmol/L  CBC with Differential     Status: None   Collection Time: 03/01/15  9:37 PM  Result Value Ref Range   WBC 5.5 4.0 - 10.5 K/uL   RBC 4.30 4.22 - 5.81 MIL/uL   Hemoglobin 13.7 13.0 - 17.0 g/dL   HCT 40.8 39.0 - 52.0 %   MCV 94.9 78.0 - 100.0 fL   MCH 31.9 26.0 - 34.0 pg   MCHC 33.6 30.0 - 36.0 g/dL   RDW 12.5 11.5 - 15.5 %   Platelets 183 150 - 400 K/uL   Neutrophils Relative % 53 %   Neutro Abs 2.9 1.7 - 7.7 K/uL   Lymphocytes Relative 31 %   Lymphs Abs 1.7 0.7 - 4.0 K/uL   Monocytes Relative 11 %   Monocytes Absolute 0.6 0.1 - 1.0 K/uL   Eosinophils Relative 4 %   Eosinophils Absolute 0.2 0.0 - 0.7 K/uL   Basophils Relative 1 %   Basophils Absolute 0.1 0.0 - 0.1 K/uL  Protime-INR     Status: None   Collection Time: 03/01/15  9:37 PM  Result Value Ref  Range   Prothrombin Time 14.4 11.6 - 15.2 seconds   INR 1.10 0.00 - 1.49  Urinalysis, Routine w reflex microscopic     Status: Abnormal   Collection Time: 03/01/15 10:54 PM  Result Value Ref Range   Color, Urine AMBER (A) YELLOW    Comment: BIOCHEMICALS MAY BE AFFECTED BY COLOR   APPearance CLEAR CLEAR   Specific Gravity, Urine >1.030 (H) 1.005 - 1.030   pH 5.5 5.0 - 8.0   Glucose, UA NEGATIVE NEGATIVE mg/dL   Hgb urine dipstick NEGATIVE NEGATIVE   Bilirubin Urine NEGATIVE NEGATIVE   Ketones, ur TRACE (A) NEGATIVE mg/dL   Protein, ur NEGATIVE NEGATIVE mg/dL   Nitrite NEGATIVE NEGATIVE   Leukocytes, UA NEGATIVE NEGATIVE    Comment: MICROSCOPIC NOT DONE ON URINES WITH NEGATIVE PROTEIN, BLOOD, LEUKOCYTES, NITRITE, OR GLUCOSE <1000 mg/dL.  Urine rapid drug screen (hosp performed)     Status: Abnormal   Collection Time: 03/01/15 10:54 PM  Result Value Ref Range   Opiates POSITIVE (A) NONE DETECTED   Cocaine NONE DETECTED NONE DETECTED   Benzodiazepines POSITIVE (A) NONE DETECTED   Amphetamines NONE DETECTED NONE DETECTED   Tetrahydrocannabinol NONE DETECTED NONE DETECTED   Barbiturates NONE DETECTED NONE DETECTED    Comment:        DRUG SCREEN FOR MEDICAL PURPOSES ONLY.  IF CONFIRMATION IS NEEDED FOR ANY PURPOSE, NOTIFY LAB WITHIN 5 DAYS.        LOWEST DETECTABLE LIMITS FOR URINE DRUG SCREEN Drug Class       Cutoff (ng/mL) Amphetamine      1000 Barbiturate      200 Benzodiazepine   229 Tricyclics       798 Opiates          300 Cocaine          300 THC              50   Lactic acid, plasma     Status: None   Collection Time: 03/02/15 12:40 AM  Result Value Ref Range   Lactic Acid, Venous 1.3 0.5 - 2.0 mmol/L  CBC     Status: Abnormal   Collection Time: 03/02/15  5:30 AM  Result Value Ref Range  WBC 7.2 4.0 - 10.5 K/uL   RBC 4.18 (L) 4.22 - 5.81 MIL/uL   Hemoglobin 13.3 13.0 - 17.0 g/dL   HCT 40.4 39.0 - 52.0 %   MCV 96.7 78.0 - 100.0 fL   MCH 31.8 26.0 - 34.0 pg    MCHC 32.9 30.0 - 36.0 g/dL   RDW 12.5 11.5 - 15.5 %   Platelets 184 150 - 400 K/uL  Comprehensive metabolic panel     Status: Abnormal   Collection Time: 03/02/15  5:30 AM  Result Value Ref Range   Sodium 143 135 - 145 mmol/L   Potassium 4.2 3.5 - 5.1 mmol/L   Chloride 106 101 - 111 mmol/L   CO2 31 22 - 32 mmol/L   Glucose, Bld 84 65 - 99 mg/dL   BUN 25 (H) 6 - 20 mg/dL   Creatinine, Ser 0.86 0.61 - 1.24 mg/dL   Calcium 8.6 (L) 8.9 - 10.3 mg/dL   Total Protein 6.0 (L) 6.5 - 8.1 g/dL   Albumin 3.7 3.5 - 5.0 g/dL   AST 17 15 - 41 U/L   ALT 22 17 - 63 U/L   Alkaline Phosphatase 54 38 - 126 U/L   Total Bilirubin 1.2 0.3 - 1.2 mg/dL   GFR calc non Af Amer >60 >60 mL/min   GFR calc Af Amer >60 >60 mL/min    Comment: (NOTE) The eGFR has been calculated using the CKD EPI equation. This calculation has not been validated in all clinical situations. eGFR's persistently <60 mL/min signify possible Chronic Kidney Disease.    Anion gap 6 5 - 15    ABGS No results for input(s): PHART, PO2ART, TCO2, HCO3 in the last 72 hours.  Invalid input(s): PCO2 CULTURES No results found for this or any previous visit (from the past 240 hour(s)). Studies/Results: Dg Chest 2 View  03/01/2015  CLINICAL DATA:  Altered mental status.  Multiple falls. EXAM: CHEST  2 VIEW COMPARISON:  01/18/2013 FINDINGS: Interval enlargement of cardiac silhouette. There is perivascular perihilar haziness that is new from prior exam. Minimal atelectasis the left lung base. No consolidation to suggest pneumonia. No pleural effusion or pneumothorax. No acute osseous abnormalities are seen. Postsurgical change seen in the mediastinum with small clip like device projecting over the lower heart. IMPRESSION: Mild enlargement of the cardiac silhouette and perivascular haziness, new from prior exams. Recommend correlation for overload. Electronically Signed   By: Jeb Levering M.D.   On: 03/01/2015 22:24   Ct Head Wo  Contrast  03/01/2015  CLINICAL DATA:  65 year old male with multiple falls and altered mental status EXAM: CT HEAD WITHOUT CONTRAST TECHNIQUE: Contiguous axial images were obtained from the base of the skull through the vertex without intravenous contrast. COMPARISON:  Head CT dated 12/17/2014 and MRI dated focal renal 16 FINDINGS: The ventricles and sulci are appropriate in size for patient's age. Minimal Periventricular and deep white matter hypodensities represent chronic microvascular ischemic changes. There is no intracranial hemorrhage. No mass effect or midline shift identified. The visualized paranasal sinuses and mastoid air cells are well aerated. The calvarium is intact. IMPRESSION: No acute intracranial hemorrhage. Minimal chronic microvascular ischemic disease. If symptoms persist and there are no contraindications, MRI may provide better evaluation if clinically indicated Electronically Signed   By: Anner Crete M.D.   On: 03/01/2015 22:17    Medications: I have reviewed the patient's current medications.  Assesment:   Active Problems:   Essential hypertension   Altered mental status  Confusion   Fall    Plan:  Medications reviewed MRI as scheduled Neurology consult Continue regular medications.      Naria Abbey 03/02/2015, 8:06 AM
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
## 2015-03-02 NOTE — Progress Notes (Signed)
At approximately, patient's spouse called hospital and requested that I get a Psych consult for patient and have him involuntarily committed for actions he has exhibited at home before his admission.  She states on 03/01/2015 that he ingested 14oz of Vodka and #7 Xanax 0.5mg  and then became irate, aggressive and mean toward her.  She stated she brought him to the ED 03/01/2015 with the hopes that the MD in the ED would have him involuntarily committed.  This morning, the patient was demanding that he be discharged from the hospital and refused to wait for MD order.  I explained, in depth, that if he leaves AMA, the hospital would not be responsible for any Discharge instructions, follow up appts, and/or any needed prescriptions.  He contacted his spouse to arrange for transport from the hospital, and she refused to pick him up.  Patient called a Field seismologist, arranged for transport and signed [place] paperwork and ambulated to the front lobby.

## 2015-03-02 NOTE — Clinical Social Work Note (Addendum)
CSW spoke with patient's wife, Santiago Glad, and provided outpatient SA treatment contact information to Lanae Boast [facility], [facility]. Ms. Spilde advised that this was enough and if she needed additional resources she would contact CSW.    Ihor Gully, [place] [phone]

## 2015-03-02 NOTE — Evaluation (Signed)
Physical Therapy Evaluation Patient Details Name: [name] MRN: [id number] DOB: 1950/10/28 Today's Date: 03/02/2015   History of Present Illness  Pt is a 65 year old male admitted due to recurrent falls.  He is s/p stroke with left hemiparesis and ambulates with a quad cane.  He lives with his wife and is normally independent in all ADLs.  He has multiple medical problems and was seen in this hospital 2 months ago for increased left sided weakness.  He is a heavy drinker but denies that this is the cause of his problems.  He states that this most recent fall occurred as he was descending steps and his left leg buckled out from underneath him.  He also states that he is under increased stress due to his wife's health problems.  Clinical Impression   Pt was seen for evaluation.  He was somewhat lethargic but oriented and able to follow all directions.  His left sided strength in the LE is significantly weaker than when evaluated during his last admission.  His standing dynamic balance is decreased as well.  He was instructed in gait using a rolling walker and this does provide increased stability for him.  He declines any follow up PT due to many personal issues he dealing with at home.    Follow Up Recommendations No PT follow up    Equipment Recommendations  Rolling walker with 5" wheels    Recommendations for Other Services   none    Precautions / Restrictions Precautions Precautions: Fall Restrictions Weight Bearing Restrictions: No      Mobility  Bed Mobility Overal bed mobility: Modified Independent                Transfers Overall transfer level: Modified independent Equipment used: Quad cane;Rolling walker (2 wheeled)                Ambulation/Gait Ambulation/Gait assistance: Supervision Ambulation Distance (Feet): 150 Feet (also 42' using a walker for instruction) Assistive device: Rolling walker (2 wheeled);Quad cane Gait Pattern/deviations:  Decreased dorsiflexion - left;Decreased weight shift to left Gait velocity: extremely slow and guarded gait with quad cane...with walker gait continues to be very slow and guarded but is more stable Gait velocity interpretation: <1.8 ft/sec, indicative of risk for recurrent falls General Gait Details: minimal left knee flexion during push off phase of gait  Stairs            Wheelchair Mobility    Modified Rankin (Stroke Patients Only)       Balance Overall balance assessment: Needs assistance Sitting-balance support: No upper extremity supported;Feet supported Sitting balance-Leahy Scale: Normal     Standing balance support: Single extremity supported Standing balance-Leahy Scale: Fair Standing balance comment: loses balance to the left...has a loss of left peripheral vision due to old stroke                             Pertinent Vitals/Pain Pain Assessment: No/denies pain    Home Living Family/patient expects to be discharged to:: Private residence Living Arrangements: Spouse/significant other Available Help at Discharge: Family;Available 24 hours/day Type of Home: House Home Access: Stairs to enter Entrance Stairs-Rails: Right Entrance Stairs-Number of Steps: 1 Home Layout: Two level        Prior Function Level of Independence: Independent with assistive device(s)         Comments: Pt uses a quad cane at home and in community for functional mobility  Hand Dominance   Dominant Hand: Right    Extremity/Trunk Assessment               Lower Extremity Assessment: LLE deficits/detail   LLE Deficits / Details: significant weakness at hip and ankle (2+/5) with quad= 3+/5  Cervical / Trunk Assessment: Kyphotic  Communication   Communication: No difficulties  Cognition Arousal/Alertness: Lethargic Behavior During Therapy: WFL for tasks assessed/performed Overall Cognitive Status: Within Functional Limits for tasks assessed        Memory: Decreased short-term memory              General Comments      Exercises        Assessment/Plan    PT Assessment Patent does not need any further PT services (pt declines any HHPT or OP PT)  PT Diagnosis     PT Problem List    PT Treatment Interventions     PT Goals (Current goals can be found in the Care Plan section) Acute Rehab PT Goals PT Goal Formulation: All assessment and education complete, DC therapy    Frequency     Barriers to discharge        Co-evaluation               End of Session Equipment Utilized During Treatment: Gait belt Activity Tolerance: Patient tolerated treatment well Patient left: in bed;with call bell/phone within reach;with bed alarm set      Functional Assessment Tool Used: clinical judgement Functional Limitation: Mobility: Walking and moving around Mobility: Walking and Moving Around Current Status [id number]): At least 1 percent but less than 20 percent impaired, limited or restricted Mobility: Walking and Moving Around Goal Status [phone]): At least 1 percent but less than 20 percent impaired, limited or restricted Mobility: Walking and Moving Around Discharge Status [phone]): At least 1 percent but less than 20 percent impaired, limited or restricted    Time: 0937-1025 PT Time Calculation (min) (ACUTE ONLY): 48 min   Charges:   PT Evaluation $PT Eval Moderate Complexity: 1 Procedure PT Treatments $Gait Training: 8-22 mins   PT G Codes:   PT G-Codes **NOT FOR INPATIENT CLASS** Functional Assessment Tool Used: clinical judgement Functional Limitation: Mobility: Walking and moving around Mobility: Walking and Moving Around Current Status [id number]): At least 1 percent but less than 20 percent impaired, limited or restricted Mobility: Walking and Moving Around Goal Status [phone]): At least 1 percent but less than 20 percent impaired, limited or restricted Mobility: Walking and Moving Around Discharge Status [phone]):  At least 1 percent but less than 20 percent impaired, limited or restricted    Sable Feil  PT 03/02/2015, 10:47 AM [phone]

## 2015-03-03 LAB — URINE CULTURE: Culture: 5000

## 2015-03-05 DIAGNOSIS — G8929 Other chronic pain: Secondary | ICD-10-CM | POA: Diagnosis not present

## 2015-03-05 DIAGNOSIS — F419 Anxiety disorder, unspecified: Secondary | ICD-10-CM | POA: Diagnosis not present

## 2015-03-05 DIAGNOSIS — F4312 Post-traumatic stress disorder, chronic: Secondary | ICD-10-CM | POA: Diagnosis not present

## 2015-03-05 DIAGNOSIS — G459 Transient cerebral ischemic attack, unspecified: Secondary | ICD-10-CM | POA: Diagnosis not present

## 2015-03-14 DIAGNOSIS — F4310 Post-traumatic stress disorder, unspecified: Secondary | ICD-10-CM | POA: Diagnosis not present

## 2015-03-18 NOTE — Discharge Summary (Signed)
Physician Discharge Summary  Patient ID: [name] MRN: [id number] DOB/AGE: December 16, 1950 65 y.o. Primary Care Physician:HAWKINS,EDWARD L, MD Admit date: 03/01/2015 Discharge date: 03/18/2015    Discharge Diagnoses:   Active Problems:   Essential hypertension   Altered mental status   Confusion   Fall     Medication List    ASK your doctor about these medications        acetaminophen 500 MG tablet  Commonly known as:  TYLENOL  Take 1,000 mg by mouth every 6 (six) hours as needed for mild pain or moderate pain. For pain     ALPRAZolam 0.5 MG tablet  Commonly known as:  XANAX  Take 0.5 mg by mouth daily as needed for anxiety or sleep.     aspirin 325 MG EC tablet  Take 325 mg by mouth daily.     calcium citrate-vitamin D 315-200 MG-UNIT tablet  Commonly known as:  CITRACAL+D  Take 2 tablets by mouth daily.     CENTRUM SILVER ULTRA MENS PO  Take 1 tablet by mouth daily.     clopidogrel 75 MG tablet  Commonly known as:  PLAVIX  Take 75 mg by mouth daily.     Fish Oil 1200 MG Caps  Take 1 capsule by mouth daily.     thiamine 100 MG tablet  Commonly known as:  VITAMIN B-1  Take 100 mg by mouth daily.     venlafaxine XR 150 MG 24 hr capsule  Commonly known as:  EFFEXOR-XR  Take 150 mg by mouth daily with breakfast.     VITAMIN B 12 PO  Take 1 tablet by mouth daily.        Discharged Condition: improved    Consults: none  Significant Diagnostic Studies: Dg Chest 2 View  03/01/2015  CLINICAL DATA:  Altered mental status.  Multiple falls. EXAM: CHEST  2 VIEW COMPARISON:  01/18/2013 FINDINGS: Interval enlargement of cardiac silhouette. There is perivascular perihilar haziness that is new from prior exam. Minimal atelectasis the left lung base. No consolidation to suggest pneumonia. No pleural effusion or pneumothorax. No acute osseous abnormalities are seen. Postsurgical change seen in the mediastinum with small clip like device projecting over the  lower heart. IMPRESSION: Mild enlargement of the cardiac silhouette and perivascular haziness, new from prior exams. Recommend correlation for overload. Electronically Signed   By: Jeb Levering M.D.   On: 03/01/2015 22:24   Ct Head Wo Contrast  03/01/2015  CLINICAL DATA:  65 year old male with multiple falls and altered mental status EXAM: CT HEAD WITHOUT CONTRAST TECHNIQUE: Contiguous axial images were obtained from the base of the skull through the vertex without intravenous contrast. COMPARISON:  Head CT dated 12/17/2014 and MRI dated focal renal 16 FINDINGS: The ventricles and sulci are appropriate in size for patient's age. Minimal Periventricular and deep white matter hypodensities represent chronic microvascular ischemic changes. There is no intracranial hemorrhage. No mass effect or midline shift identified. The visualized paranasal sinuses and mastoid air cells are well aerated. The calvarium is intact. IMPRESSION: No acute intracranial hemorrhage. Minimal chronic microvascular ischemic disease. If symptoms persist and there are no contraindications, MRI may provide better evaluation if clinically indicated Electronically Signed   By: Anner Crete M.D.   On: 03/01/2015 22:17   Mr Brain Wo Contrast  03/02/2015  CLINICAL DATA:  Stroke.  Altered mental status. EXAM: MRI HEAD WITHOUT CONTRAST TECHNIQUE: Multiplanar, multiecho pulse sequences of the brain and surrounding structures were obtained without intravenous contrast.  COMPARISON:  CT 03/01/2015.  MRI 12/18/2014 FINDINGS: Negative for acute infarct. Small white matter hyperintensities bilaterally are unchanged. Small chronic infarcts in the cerebellum bilaterally unchanged. Ventricle size normal.  Cerebral volume normal for age. Negative for intracranial hemorrhage.  No fluid collection. Negative for mass or edema.  No shift of the midline structures. Mild mucosal edema in the paranasal sinuses. Normal orbit. Pituitary normal in size.  IMPRESSION: Mild chronic ischemic changes are stable.  No acute infarct. Electronically Signed   By: Franchot Gallo M.D.   On: 03/02/2015 08:32    Lab Results: Basic Metabolic Panel: No results for input(s): NA, K, CL, CO2, GLUCOSE, BUN, CREATININE, CALCIUM, MG, PHOS in the last 72 hours. Liver Function Tests: No results for input(s): AST, ALT, ALKPHOS, BILITOT, PROT, ALBUMIN in the last 72 hours.   CBC: No results for input(s): WBC, NEUTROABS, HGB, HCT, MCV, PLT in the last 72 hours.  No results found for this or any previous visit (from the past 240 hour(s)).   Hospital Course:   Patient was admitted due history of confusion and fall. According to his wife consumed a lot of alcohol and took xanax. After admission patient wake up and improved. However, patient signed out AMA and he didn't want to have further evaluation.  Discharge Exam: Blood pressure 131/81, pulse 55, temperature 98 F (36.7 C), temperature source Oral, resp. rate 18, height 6\' 1"  (1.854 m), weight 107.639 kg (237 lb 4.8 oz), SpO2 96 %.   Disposition:  Signed out AMA      Signed: Rosy Estabrook   03/18/2015, 10:39 AM

## 2015-03-29 DIAGNOSIS — F0631 Mood disorder due to known physiological condition with depressive features: Secondary | ICD-10-CM | POA: Diagnosis not present

## 2015-04-04 DIAGNOSIS — F329 Major depressive disorder, single episode, unspecified: Secondary | ICD-10-CM | POA: Diagnosis not present

## 2015-04-04 DIAGNOSIS — F4310 Post-traumatic stress disorder, unspecified: Secondary | ICD-10-CM | POA: Diagnosis not present

## 2015-04-05 DIAGNOSIS — F0631 Mood disorder due to known physiological condition with depressive features: Secondary | ICD-10-CM | POA: Diagnosis not present

## 2015-04-11 DIAGNOSIS — F0631 Mood disorder due to known physiological condition with depressive features: Secondary | ICD-10-CM | POA: Diagnosis not present

## 2015-04-16 DIAGNOSIS — F4312 Post-traumatic stress disorder, chronic: Secondary | ICD-10-CM | POA: Diagnosis not present

## 2015-04-16 DIAGNOSIS — G8929 Other chronic pain: Secondary | ICD-10-CM | POA: Diagnosis not present

## 2015-04-16 DIAGNOSIS — G459 Transient cerebral ischemic attack, unspecified: Secondary | ICD-10-CM | POA: Diagnosis not present

## 2015-04-16 DIAGNOSIS — F482 Pseudobulbar affect: Secondary | ICD-10-CM | POA: Diagnosis not present

## 2015-05-03 DIAGNOSIS — F0631 Mood disorder due to known physiological condition with depressive features: Secondary | ICD-10-CM | POA: Diagnosis not present

## 2015-05-08 DIAGNOSIS — F0631 Mood disorder due to known physiological condition with depressive features: Secondary | ICD-10-CM | POA: Diagnosis not present

## 2015-06-18 DIAGNOSIS — G8929 Other chronic pain: Secondary | ICD-10-CM | POA: Diagnosis not present

## 2015-06-18 DIAGNOSIS — F09 Unspecified mental disorder due to known physiological condition: Secondary | ICD-10-CM | POA: Diagnosis not present

## 2015-06-18 DIAGNOSIS — F4312 Post-traumatic stress disorder, chronic: Secondary | ICD-10-CM | POA: Diagnosis not present

## 2015-06-18 DIAGNOSIS — I6781 Acute cerebrovascular insufficiency: Secondary | ICD-10-CM | POA: Diagnosis not present

## 2015-07-11 DIAGNOSIS — F329 Major depressive disorder, single episode, unspecified: Secondary | ICD-10-CM | POA: Diagnosis not present

## 2015-07-11 DIAGNOSIS — F4310 Post-traumatic stress disorder, unspecified: Secondary | ICD-10-CM | POA: Diagnosis not present

## 2015-07-25 DIAGNOSIS — F419 Anxiety disorder, unspecified: Secondary | ICD-10-CM | POA: Diagnosis not present

## 2015-07-25 DIAGNOSIS — F329 Major depressive disorder, single episode, unspecified: Secondary | ICD-10-CM | POA: Diagnosis not present

## 2015-07-25 DIAGNOSIS — F4310 Post-traumatic stress disorder, unspecified: Secondary | ICD-10-CM | POA: Diagnosis not present

## 2015-07-26 DIAGNOSIS — F0631 Mood disorder due to known physiological condition with depressive features: Secondary | ICD-10-CM | POA: Diagnosis not present

## 2015-08-07 DIAGNOSIS — F0631 Mood disorder due to known physiological condition with depressive features: Secondary | ICD-10-CM | POA: Diagnosis not present

## 2015-09-04 DIAGNOSIS — F0631 Mood disorder due to known physiological condition with depressive features: Secondary | ICD-10-CM | POA: Diagnosis not present

## 2015-09-20 DIAGNOSIS — F482 Pseudobulbar affect: Secondary | ICD-10-CM | POA: Diagnosis not present

## 2015-09-20 DIAGNOSIS — F09 Unspecified mental disorder due to known physiological condition: Secondary | ICD-10-CM | POA: Diagnosis not present

## 2015-09-20 DIAGNOSIS — F4312 Post-traumatic stress disorder, chronic: Secondary | ICD-10-CM | POA: Diagnosis not present

## 2015-09-20 DIAGNOSIS — I639 Cerebral infarction, unspecified: Secondary | ICD-10-CM | POA: Diagnosis not present

## 2015-09-20 DIAGNOSIS — F0631 Mood disorder due to known physiological condition with depressive features: Secondary | ICD-10-CM | POA: Diagnosis not present

## 2015-09-27 DIAGNOSIS — F0631 Mood disorder due to known physiological condition with depressive features: Secondary | ICD-10-CM | POA: Diagnosis not present

## 2015-10-10 DIAGNOSIS — F0631 Mood disorder due to known physiological condition with depressive features: Secondary | ICD-10-CM | POA: Diagnosis not present

## 2015-10-18 ENCOUNTER — Telehealth: Payer: Self-pay | Admitting: General Practice

## 2015-10-18 DIAGNOSIS — F0631 Mood disorder due to known physiological condition with depressive features: Secondary | ICD-10-CM | POA: Diagnosis not present

## 2015-10-18 NOTE — Telephone Encounter (Signed)
I received a call from RMR and he stated that the patient needs to come in to see him tomorrow.

## 2015-10-18 NOTE — Telephone Encounter (Signed)
Pt is aware.  

## 2015-10-18 NOTE — Telephone Encounter (Signed)
Patient is scheduled to see RMR tomorrow at 11:30.  Routing to [place] to call the patient at [phone]

## 2015-10-19 ENCOUNTER — Encounter: Payer: Self-pay | Admitting: Internal Medicine

## 2015-10-19 ENCOUNTER — Ambulatory Visit (INDEPENDENT_AMBULATORY_CARE_PROVIDER_SITE_OTHER): Payer: Medicare Other | Admitting: Internal Medicine

## 2015-10-19 ENCOUNTER — Encounter (HOSPITAL_COMMUNITY): Payer: Self-pay | Admitting: Emergency Medicine

## 2015-10-19 ENCOUNTER — Emergency Department (HOSPITAL_COMMUNITY)
Admission: EM | Admit: 2015-10-19 | Discharge: 2015-10-19 | Disposition: A | Discharge status: Home / Self Care | Payer: Medicare Other | Attending: Emergency Medicine | Admitting: Emergency Medicine

## 2015-10-19 VITALS — BP 135/85 | HR 80 | Temp 98.3°F | Ht 73.0 in | Wt 232.4 lb

## 2015-10-19 DIAGNOSIS — Z86010 Personal history of colonic polyps: Secondary | ICD-10-CM | POA: Diagnosis not present

## 2015-10-19 DIAGNOSIS — Z7982 Long term (current) use of aspirin: Secondary | ICD-10-CM | POA: Insufficient documentation

## 2015-10-19 DIAGNOSIS — K611 Rectal abscess: Secondary | ICD-10-CM | POA: Insufficient documentation

## 2015-10-19 DIAGNOSIS — K610 Anal abscess: Secondary | ICD-10-CM | POA: Diagnosis not present

## 2015-10-19 DIAGNOSIS — R195 Other fecal abnormalities: Secondary | ICD-10-CM

## 2015-10-19 DIAGNOSIS — I639 Cerebral infarction, unspecified: Secondary | ICD-10-CM

## 2015-10-19 DIAGNOSIS — Z79899 Other long term (current) drug therapy: Secondary | ICD-10-CM | POA: Insufficient documentation

## 2015-10-19 DIAGNOSIS — Z87891 Personal history of nicotine dependence: Secondary | ICD-10-CM | POA: Insufficient documentation

## 2015-10-19 MED ORDER — METRONIDAZOLE 500 MG PO TABS: 500.0000 mg | ORAL_TABLET | Freq: Three times a day (TID) | ORAL | 0 refills | Status: DC

## 2015-10-19 MED ORDER — CIPROFLOXACIN HCL 500 MG PO TABS: 500.0000 mg | ORAL_TABLET | Freq: Two times a day (BID) | ORAL | 0 refills | Status: DC

## 2015-10-19 MED ORDER — AMOXICILLIN-POT CLAVULANATE 875-125 MG PO TABS: 1.0000 | ORAL_TABLET | Freq: Two times a day (BID) | ORAL | 0 refills | Status: DC

## 2015-10-19 NOTE — ED Notes (Signed)
Pt was given all of his instructions by Dr. Rosana Hoes.  Denies any questions.  Left in no distress.

## 2015-10-19 NOTE — ED Provider Notes (Signed)
MSE was initiated and I personally evaluated the patient and placed orders (if any) at  12:20 PM on October 19, 2015.  The patient appears stable so that the remainder of the MSE may be completed by another provider.  Pt brought to ER by Dr Rosana Hoes, [place], for I and D of perirectal abscess. No assistance needed from ER physician.    Jola Schmidt, MD 10/19/15 1314

## 2015-10-19 NOTE — ED Triage Notes (Signed)
Reports perirectal abscess since Wed.  Was sent here to see general surgeon.

## 2015-10-19 NOTE — Patient Instructions (Signed)
   Go directly to [facility] ED at [facility] emergency department now.  Dr. Tama High will be meeting you there to treat what appears to be a perirectal abscess.  We will arrange to see you back in about a month or so to set up a diagnostic colonoscopy given Hemoccult positive stool and history of colonic polyps

## 2015-10-19 NOTE — ED Notes (Signed)
Pt c/o peri-rectal abscess that started Wednesday, denies any drainage. Reports episode of constipation prior to abscess formation. Denies fever, N/V/, difficulty with urination, or chills.
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
## 2015-10-21 DIAGNOSIS — Z4801 Encounter for change or removal of surgical wound dressing: Secondary | ICD-10-CM | POA: Diagnosis not present

## 2015-10-21 DIAGNOSIS — L03317 Cellulitis of buttock: Secondary | ICD-10-CM | POA: Diagnosis not present

## 2015-11-08 DIAGNOSIS — F0631 Mood disorder due to known physiological condition with depressive features: Secondary | ICD-10-CM | POA: Diagnosis not present

## 2015-11-27 DIAGNOSIS — F0631 Mood disorder due to known physiological condition with depressive features: Secondary | ICD-10-CM | POA: Diagnosis not present

## 2015-11-27 DIAGNOSIS — Z23 Encounter for immunization: Secondary | ICD-10-CM | POA: Diagnosis not present

## 2015-12-06 DIAGNOSIS — F0631 Mood disorder due to known physiological condition with depressive features: Secondary | ICD-10-CM | POA: Diagnosis not present

## 2015-12-18 DIAGNOSIS — F0631 Mood disorder due to known physiological condition with depressive features: Secondary | ICD-10-CM | POA: Diagnosis not present

## 2015-12-24 DIAGNOSIS — I10 Essential (primary) hypertension: Secondary | ICD-10-CM | POA: Diagnosis not present

## 2015-12-24 DIAGNOSIS — I6781 Acute cerebrovascular insufficiency: Secondary | ICD-10-CM | POA: Diagnosis not present

## 2015-12-24 DIAGNOSIS — F05 Delirium due to known physiological condition: Secondary | ICD-10-CM | POA: Diagnosis not present

## 2015-12-24 DIAGNOSIS — G8929 Other chronic pain: Secondary | ICD-10-CM | POA: Diagnosis not present

## 2015-12-25 DIAGNOSIS — F0631 Mood disorder due to known physiological condition with depressive features: Secondary | ICD-10-CM | POA: Diagnosis not present

## 2016-01-08 DIAGNOSIS — F0631 Mood disorder due to known physiological condition with depressive features: Secondary | ICD-10-CM | POA: Diagnosis not present

## 2016-01-09 DIAGNOSIS — I10 Essential (primary) hypertension: Secondary | ICD-10-CM | POA: Diagnosis not present

## 2016-01-23 DIAGNOSIS — I10 Essential (primary) hypertension: Secondary | ICD-10-CM | POA: Diagnosis not present

## 2016-01-28 DIAGNOSIS — R4689 Other symptoms and signs involving appearance and behavior: Secondary | ICD-10-CM | POA: Diagnosis not present

## 2016-01-28 DIAGNOSIS — R4189 Other symptoms and signs involving cognitive functions and awareness: Secondary | ICD-10-CM | POA: Diagnosis not present

## 2016-01-29 DIAGNOSIS — F0631 Mood disorder due to known physiological condition with depressive features: Secondary | ICD-10-CM | POA: Diagnosis not present

## 2016-02-04 DIAGNOSIS — F0390 Unspecified dementia without behavioral disturbance: Secondary | ICD-10-CM | POA: Diagnosis not present

## 2016-02-04 DIAGNOSIS — F05 Delirium due to known physiological condition: Secondary | ICD-10-CM | POA: Diagnosis not present

## 2016-02-08 DIAGNOSIS — H25813 Combined forms of age-related cataract, bilateral: Secondary | ICD-10-CM | POA: Diagnosis not present

## 2016-02-08 DIAGNOSIS — H40013 Open angle with borderline findings, low risk, bilateral: Secondary | ICD-10-CM | POA: Diagnosis not present

## 2016-02-08 DIAGNOSIS — H53453 Other localized visual field defect, bilateral: Secondary | ICD-10-CM | POA: Diagnosis not present

## 2016-03-04 DIAGNOSIS — R4189 Other symptoms and signs involving cognitive functions and awareness: Secondary | ICD-10-CM | POA: Diagnosis not present

## 2016-03-04 DIAGNOSIS — R4689 Other symptoms and signs involving appearance and behavior: Secondary | ICD-10-CM | POA: Diagnosis not present

## 2016-03-11 DIAGNOSIS — F0631 Mood disorder due to known physiological condition with depressive features: Secondary | ICD-10-CM | POA: Diagnosis not present

## 2016-03-19 DIAGNOSIS — R413 Other amnesia: Secondary | ICD-10-CM | POA: Diagnosis not present

## 2016-03-19 DIAGNOSIS — G4733 Obstructive sleep apnea (adult) (pediatric): Secondary | ICD-10-CM | POA: Diagnosis not present

## 2016-03-19 DIAGNOSIS — F419 Anxiety disorder, unspecified: Secondary | ICD-10-CM | POA: Diagnosis not present

## 2016-03-19 DIAGNOSIS — F329 Major depressive disorder, single episode, unspecified: Secondary | ICD-10-CM | POA: Diagnosis not present

## 2016-03-21 ENCOUNTER — Encounter: Payer: Self-pay | Admitting: Internal Medicine

## 2016-03-26 DIAGNOSIS — R4189 Other symptoms and signs involving cognitive functions and awareness: Secondary | ICD-10-CM | POA: Diagnosis not present

## 2016-03-26 DIAGNOSIS — R4689 Other symptoms and signs involving appearance and behavior: Secondary | ICD-10-CM | POA: Diagnosis not present

## 2016-03-26 DIAGNOSIS — Z8673 Personal history of transient ischemic attack (TIA), and cerebral infarction without residual deficits: Secondary | ICD-10-CM | POA: Diagnosis not present

## 2016-03-27 DIAGNOSIS — F419 Anxiety disorder, unspecified: Secondary | ICD-10-CM | POA: Diagnosis not present

## 2016-03-27 DIAGNOSIS — F0631 Mood disorder due to known physiological condition with depressive features: Secondary | ICD-10-CM | POA: Diagnosis not present

## 2016-03-27 DIAGNOSIS — I6781 Acute cerebrovascular insufficiency: Secondary | ICD-10-CM | POA: Diagnosis not present

## 2016-03-27 DIAGNOSIS — I10 Essential (primary) hypertension: Secondary | ICD-10-CM | POA: Diagnosis not present

## 2016-03-27 DIAGNOSIS — F320 Major depressive disorder, single episode, mild: Secondary | ICD-10-CM | POA: Diagnosis not present

## 2016-03-28 DIAGNOSIS — F3341 Major depressive disorder, recurrent, in partial remission: Secondary | ICD-10-CM | POA: Diagnosis not present

## 2016-03-28 DIAGNOSIS — F450 Somatization disorder: Secondary | ICD-10-CM | POA: Diagnosis not present

## 2016-03-28 DIAGNOSIS — F1311 Sedative, hypnotic or anxiolytic abuse, in remission: Secondary | ICD-10-CM | POA: Insufficient documentation

## 2016-03-28 DIAGNOSIS — F419 Anxiety disorder, unspecified: Secondary | ICD-10-CM | POA: Diagnosis not present

## 2016-04-08 ENCOUNTER — Other Ambulatory Visit (HOSPITAL_COMMUNITY): Payer: Self-pay | Admitting: Pulmonary Disease

## 2016-04-08 ENCOUNTER — Ambulatory Visit (HOSPITAL_COMMUNITY)
Admission: RE | Admit: 2016-04-08 | Discharge: 2016-04-08 | Disposition: A | Discharge status: Home / Self Care | Payer: Medicare Other | Source: Ambulatory Visit | Attending: Pulmonary Disease | Admitting: Pulmonary Disease

## 2016-04-08 DIAGNOSIS — S299XXA Unspecified injury of thorax, initial encounter: Secondary | ICD-10-CM | POA: Diagnosis not present

## 2016-04-08 DIAGNOSIS — R0781 Pleurodynia: Secondary | ICD-10-CM

## 2016-04-15 DIAGNOSIS — F0631 Mood disorder due to known physiological condition with depressive features: Secondary | ICD-10-CM | POA: Diagnosis not present

## 2016-05-16 DIAGNOSIS — F419 Anxiety disorder, unspecified: Secondary | ICD-10-CM | POA: Diagnosis not present

## 2016-05-16 DIAGNOSIS — F3341 Major depressive disorder, recurrent, in partial remission: Secondary | ICD-10-CM | POA: Diagnosis not present

## 2016-05-16 DIAGNOSIS — F449 Dissociative and conversion disorder, unspecified: Secondary | ICD-10-CM | POA: Diagnosis not present

## 2016-05-16 DIAGNOSIS — F450 Somatization disorder: Secondary | ICD-10-CM | POA: Diagnosis not present

## 2016-06-06 ENCOUNTER — Encounter (HOSPITAL_COMMUNITY): Payer: Self-pay | Admitting: Emergency Medicine

## 2016-06-06 ENCOUNTER — Inpatient Hospital Stay (HOSPITAL_COMMUNITY)
Admission: EM | Admit: 2016-06-06 | Discharge: 2016-06-10 | DRG: 918 | Disposition: A | Discharge status: Other Acute Inpatient Hospital | Payer: Medicare Other | Attending: Pulmonary Disease | Admitting: Pulmonary Disease

## 2016-06-06 DIAGNOSIS — Z79899 Other long term (current) drug therapy: Secondary | ICD-10-CM

## 2016-06-06 DIAGNOSIS — T1491XA Suicide attempt, initial encounter: Secondary | ICD-10-CM | POA: Diagnosis not present

## 2016-06-06 DIAGNOSIS — R001 Bradycardia, unspecified: Secondary | ICD-10-CM | POA: Diagnosis present

## 2016-06-06 DIAGNOSIS — Z8673 Personal history of transient ischemic attack (TIA), and cerebral infarction without residual deficits: Secondary | ICD-10-CM | POA: Diagnosis not present

## 2016-06-06 DIAGNOSIS — Z6831 Body mass index (BMI) 31.0-31.9, adult: Secondary | ICD-10-CM

## 2016-06-06 DIAGNOSIS — E669 Obesity, unspecified: Secondary | ICD-10-CM | POA: Diagnosis present

## 2016-06-06 DIAGNOSIS — Z7982 Long term (current) use of aspirin: Secondary | ICD-10-CM

## 2016-06-06 DIAGNOSIS — I10 Essential (primary) hypertension: Secondary | ICD-10-CM | POA: Diagnosis not present

## 2016-06-06 DIAGNOSIS — Z87891 Personal history of nicotine dependence: Secondary | ICD-10-CM

## 2016-06-06 DIAGNOSIS — F329 Major depressive disorder, single episode, unspecified: Secondary | ICD-10-CM | POA: Diagnosis not present

## 2016-06-06 DIAGNOSIS — F29 Unspecified psychosis not due to a substance or known physiological condition: Secondary | ICD-10-CM | POA: Diagnosis not present

## 2016-06-06 DIAGNOSIS — Z823 Family history of stroke: Secondary | ICD-10-CM

## 2016-06-06 DIAGNOSIS — R42 Dizziness and giddiness: Secondary | ICD-10-CM | POA: Diagnosis present

## 2016-06-06 DIAGNOSIS — T50902A Poisoning by unspecified drugs, medicaments and biological substances, intentional self-harm, initial encounter: Secondary | ICD-10-CM | POA: Diagnosis present

## 2016-06-06 DIAGNOSIS — F1010 Alcohol abuse, uncomplicated: Secondary | ICD-10-CM | POA: Diagnosis present

## 2016-06-06 DIAGNOSIS — Z8249 Family history of ischemic heart disease and other diseases of the circulatory system: Secondary | ICD-10-CM

## 2016-06-06 DIAGNOSIS — Z888 Allergy status to other drugs, medicaments and biological substances status: Secondary | ICD-10-CM

## 2016-06-06 DIAGNOSIS — T447X2A Poisoning by beta-adrenoreceptor antagonists, intentional self-harm, initial encounter: Secondary | ICD-10-CM | POA: Diagnosis not present

## 2016-06-06 DIAGNOSIS — Z960 Presence of urogenital implants: Secondary | ICD-10-CM | POA: Diagnosis present

## 2016-06-06 DIAGNOSIS — R45851 Suicidal ideations: Secondary | ICD-10-CM | POA: Diagnosis not present

## 2016-06-06 DIAGNOSIS — Z9889 Other specified postprocedural states: Secondary | ICD-10-CM

## 2016-06-06 DIAGNOSIS — G4730 Sleep apnea, unspecified: Secondary | ICD-10-CM | POA: Diagnosis present

## 2016-06-06 LAB — URINALYSIS, ROUTINE W REFLEX MICROSCOPIC
Glucose, UA: NEGATIVE mg/dL
Hgb urine dipstick: NEGATIVE
Ketones, ur: NEGATIVE mg/dL
Leukocytes, UA: NEGATIVE
Nitrite: NEGATIVE
Protein, ur: NEGATIVE mg/dL
Specific Gravity, Urine: 1.012 (ref 1.005–1.030)
pH: 6 (ref 5.0–8.0)

## 2016-06-06 LAB — COMPREHENSIVE METABOLIC PANEL
ALT: 38 U/L (ref 17–63)
AST: 27 U/L (ref 15–41)
Albumin: 3.9 g/dL (ref 3.5–5.0)
Alkaline Phosphatase: 59 U/L (ref 38–126)
Anion gap: 9 (ref 5–15)
BUN: 19 mg/dL (ref 6–20)
CO2: 24 mmol/L (ref 22–32)
Calcium: 8.7 mg/dL — ABNORMAL LOW (ref 8.9–10.3)
Chloride: 110 mmol/L (ref 101–111)
Creatinine, Ser: 0.76 mg/dL (ref 0.61–1.24)
GFR calc Af Amer: >60 mL/min (ref >60)
GFR calc non Af Amer: >60 mL/min (ref >60)
Glucose, Bld: 105 mg/dL — ABNORMAL HIGH (ref 65–99)
Potassium: 3.7 mmol/L (ref 3.5–5.1)
Sodium: 143 mmol/L (ref 135–145)
Total Bilirubin: 0.4 mg/dL (ref 0.3–1.2)
Total Protein: 6.5 g/dL (ref 6.5–8.1)

## 2016-06-06 LAB — CBC WITH DIFFERENTIAL/PLATELET
Basophils Absolute: 0 10*3/uL (ref 0.0–0.1)
Basophils Relative: 1 %
Eosinophils Absolute: 0.2 10*3/uL (ref 0.0–0.7)
Eosinophils Relative: 4 %
HCT: 39.7 % (ref 39.0–52.0)
Hemoglobin: 13.7 g/dL (ref 13.0–17.0)
Lymphocytes Relative: 30 %
Lymphs Abs: 2 10*3/uL (ref 0.7–4.0)
MCH: 33.2 pg (ref 26.0–34.0)
MCHC: 34.5 g/dL (ref 30.0–36.0)
MCV: 96.1 fL (ref 78.0–100.0)
Monocytes Absolute: 0.6 10*3/uL (ref 0.1–1.0)
Monocytes Relative: 8 %
Neutro Abs: 4 10*3/uL (ref 1.7–7.7)
Neutrophils Relative %: 57 %
Platelets: 228 10*3/uL (ref 150–400)
RBC: 4.13 MIL/uL — ABNORMAL LOW (ref 4.22–5.81)
RDW: 12.4 % (ref 11.5–15.5)
WBC: 6.8 10*3/uL (ref 4.0–10.5)

## 2016-06-06 LAB — RAPID URINE DRUG SCREEN, HOSP PERFORMED
Amphetamines: NOT DETECTED
Barbiturates: NOT DETECTED
Benzodiazepines: NOT DETECTED
Cocaine: NOT DETECTED
Opiates: NOT DETECTED
Tetrahydrocannabinol: NOT DETECTED

## 2016-06-06 LAB — ETHANOL: Alcohol, Ethyl (B): 112 mg/dL — ABNORMAL HIGH (ref <5)

## 2016-06-06 LAB — SALICYLATE LEVEL: Salicylate Lvl: <7 mg/dL (ref 2.8–30.0)

## 2016-06-06 LAB — ACETAMINOPHEN LEVEL: Acetaminophen (Tylenol), Serum: <10 ug/mL — ABNORMAL LOW (ref 10–30)

## 2016-06-06 LAB — LIPASE, BLOOD: Lipase: 21 U/L (ref 11–51)

## 2016-06-06 MED ORDER — ACETAMINOPHEN 500 MG PO TABS
1000.0000 mg | ORAL_TABLET | Freq: Four times a day (QID) | ORAL | Status: DC | PRN
  Administered 2016-06-06: 1000 mg via ORAL
  Filled 2016-06-06: qty 2

## 2016-06-06 MED ORDER — SODIUM CHLORIDE 0.9 % IV BOLUS (SEPSIS)
1000.0000 mL | Freq: Once | INTRAVENOUS | Status: AC
  Administered 2016-06-06: 1000 mL via INTRAVENOUS

## 2016-06-06 MED ORDER — VENLAFAXINE HCL ER 75 MG PO CP24
225.0000 mg | ORAL_CAPSULE | Freq: Every day | ORAL | Status: DC
  Administered 2016-06-07: 150 mg via ORAL
  Filled 2016-06-06: qty 3

## 2016-06-06 MED ORDER — CLOPIDOGREL BISULFATE 75 MG PO TABS
75.0000 mg | ORAL_TABLET | Freq: Every day | ORAL | Status: DC
  Administered 2016-06-07: 75 mg via ORAL
  Filled 2016-06-06: qty 1

## 2016-06-06 MED ORDER — ASPIRIN EC 325 MG PO TBEC
325.0000 mg | DELAYED_RELEASE_TABLET | Freq: Every day | ORAL | Status: DC
  Administered 2016-06-07: 325 mg via ORAL
  Filled 2016-06-06: qty 1

## 2016-06-06 NOTE — ED Notes (Signed)
Pt's wife Najeh Credit can be reached at [phone] and wants to talk with person from [facility] once TTS is done. Copy of Behavioral Health Visitor Guidelines for our department given to wife.

## 2016-06-06 NOTE — BH Assessment (Signed)
Lindon Romp, NP recommends inpatient hospitalization.   [place] MSW, [place]  06/06/2016 10:39 PM
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
## 2016-06-06 NOTE — ED Notes (Signed)
Pt states he does not need to urinate at this time.  

## 2016-06-06 NOTE — ED Notes (Signed)
Notified Poison Control of ingestion. They recommend minimum 6hr cardiac observation for IR Propanolol and minimum of 8hr observation for modified or ER Propanolol as long as pt's vitals remain stable, etc.. Dr. Vanita Panda notified of this.
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
## 2016-06-06 NOTE — ED Triage Notes (Signed)
Patient arrives via EMS from home with c/o overdose. Patient wrote a suicide note and took unknown amount of propanolol. Unknown if he took additional medication. Patient is alert, but withdrawn, answers questions.

## 2016-06-07 DIAGNOSIS — T1491XA Suicide attempt, initial encounter: Secondary | ICD-10-CM | POA: Diagnosis not present

## 2016-06-07 DIAGNOSIS — Z87891 Personal history of nicotine dependence: Secondary | ICD-10-CM | POA: Diagnosis not present

## 2016-06-07 DIAGNOSIS — F1010 Alcohol abuse, uncomplicated: Secondary | ICD-10-CM

## 2016-06-07 DIAGNOSIS — Z8673 Personal history of transient ischemic attack (TIA), and cerebral infarction without residual deficits: Secondary | ICD-10-CM | POA: Diagnosis not present

## 2016-06-07 DIAGNOSIS — R42 Dizziness and giddiness: Secondary | ICD-10-CM | POA: Diagnosis not present

## 2016-06-07 DIAGNOSIS — Z79899 Other long term (current) drug therapy: Secondary | ICD-10-CM | POA: Diagnosis not present

## 2016-06-07 DIAGNOSIS — Z8249 Family history of ischemic heart disease and other diseases of the circulatory system: Secondary | ICD-10-CM | POA: Diagnosis not present

## 2016-06-07 DIAGNOSIS — I10 Essential (primary) hypertension: Secondary | ICD-10-CM

## 2016-06-07 DIAGNOSIS — T50902A Poisoning by unspecified drugs, medicaments and biological substances, intentional self-harm, initial encounter: Secondary | ICD-10-CM | POA: Diagnosis present

## 2016-06-07 DIAGNOSIS — F339 Major depressive disorder, recurrent, unspecified: Secondary | ICD-10-CM | POA: Diagnosis present

## 2016-06-07 DIAGNOSIS — F329 Major depressive disorder, single episode, unspecified: Secondary | ICD-10-CM | POA: Diagnosis not present

## 2016-06-07 DIAGNOSIS — Z7982 Long term (current) use of aspirin: Secondary | ICD-10-CM | POA: Diagnosis not present

## 2016-06-07 DIAGNOSIS — Z9889 Other specified postprocedural states: Secondary | ICD-10-CM | POA: Diagnosis not present

## 2016-06-07 DIAGNOSIS — T447X2A Poisoning by beta-adrenoreceptor antagonists, intentional self-harm, initial encounter: Secondary | ICD-10-CM | POA: Diagnosis not present

## 2016-06-07 DIAGNOSIS — R51 Headache: Secondary | ICD-10-CM | POA: Diagnosis present

## 2016-06-07 DIAGNOSIS — Z960 Presence of urogenital implants: Secondary | ICD-10-CM | POA: Diagnosis not present

## 2016-06-07 DIAGNOSIS — Z823 Family history of stroke: Secondary | ICD-10-CM | POA: Diagnosis not present

## 2016-06-07 DIAGNOSIS — E669 Obesity, unspecified: Secondary | ICD-10-CM | POA: Diagnosis present

## 2016-06-07 DIAGNOSIS — Z888 Allergy status to other drugs, medicaments and biological substances status: Secondary | ICD-10-CM | POA: Diagnosis not present

## 2016-06-07 DIAGNOSIS — R001 Bradycardia, unspecified: Secondary | ICD-10-CM | POA: Diagnosis not present

## 2016-06-07 DIAGNOSIS — G4730 Sleep apnea, unspecified: Secondary | ICD-10-CM | POA: Diagnosis not present

## 2016-06-07 DIAGNOSIS — Z6831 Body mass index (BMI) 31.0-31.9, adult: Secondary | ICD-10-CM | POA: Diagnosis not present

## 2016-06-07 DIAGNOSIS — F411 Generalized anxiety disorder: Secondary | ICD-10-CM | POA: Diagnosis present

## 2016-06-07 LAB — CREATININE, SERUM
Creatinine, Ser: 0.93 mg/dL (ref 0.61–1.24)
GFR calc Af Amer: >60 mL/min (ref >60)
GFR calc non Af Amer: >60 mL/min (ref >60)

## 2016-06-07 LAB — CBC
HCT: 39 % (ref 39.0–52.0)
Hemoglobin: 13.4 g/dL (ref 13.0–17.0)
MCH: 33.5 pg (ref 26.0–34.0)
MCHC: 34.4 g/dL (ref 30.0–36.0)
MCV: 97.5 fL (ref 78.0–100.0)
Platelets: 212 10*3/uL (ref 150–400)
RBC: 4 MIL/uL — ABNORMAL LOW (ref 4.22–5.81)
RDW: 12.5 % (ref 11.5–15.5)
WBC: 8.1 10*3/uL (ref 4.0–10.5)

## 2016-06-07 MED ORDER — SODIUM CHLORIDE 0.9 % IV SOLN
INTRAVENOUS | Status: DC
  Administered 2016-06-07 – 2016-06-10 (×5): via INTRAVENOUS

## 2016-06-07 MED ORDER — VENLAFAXINE HCL ER 75 MG PO CP24
150.0000 mg | ORAL_CAPSULE | Freq: Every day | ORAL | Status: DC
  Administered 2016-06-08 – 2016-06-10 (×3): 150 mg via ORAL
  Filled 2016-06-07 (×3): qty 2

## 2016-06-07 MED ORDER — HYDRALAZINE HCL 20 MG/ML IJ SOLN: 10.0000 mg | INTRAMUSCULAR | Status: DC | PRN

## 2016-06-07 MED ORDER — SODIUM CHLORIDE 0.9% FLUSH: 3.0000 mL | INTRAVENOUS | Status: DC | PRN

## 2016-06-07 MED ORDER — ENOXAPARIN SODIUM 40 MG/0.4ML ~~LOC~~ SOLN
40.0000 mg | SUBCUTANEOUS | Status: DC
  Administered 2016-06-07 – 2016-06-08 (×2): 40 mg via SUBCUTANEOUS
  Filled 2016-06-07 (×2): qty 0.4

## 2016-06-07 MED ORDER — ONDANSETRON HCL 4 MG PO TABS: 4.0000 mg | ORAL_TABLET | Freq: Four times a day (QID) | ORAL | Status: DC | PRN

## 2016-06-07 MED ORDER — SODIUM CHLORIDE 0.9% FLUSH
3.0000 mL | Freq: Two times a day (BID) | INTRAVENOUS | Status: DC
  Administered 2016-06-07 – 2016-06-10 (×5): 3 mL via INTRAVENOUS

## 2016-06-07 MED ORDER — ONDANSETRON HCL 4 MG/2ML IJ SOLN: 4.0000 mg | Freq: Four times a day (QID) | INTRAMUSCULAR | Status: DC | PRN

## 2016-06-07 MED ORDER — ACETAMINOPHEN 325 MG PO TABS
650.0000 mg | ORAL_TABLET | Freq: Once | ORAL | Status: AC
  Administered 2016-06-07: 650 mg via ORAL
  Filled 2016-06-07: qty 2

## 2016-06-07 MED ORDER — SODIUM CHLORIDE 0.9 % IV SOLN: 250.0000 mL | INTRAVENOUS | Status: DC | PRN

## 2016-06-07 MED ORDER — LAMOTRIGINE 100 MG PO TABS
100.0000 mg | ORAL_TABLET | Freq: Two times a day (BID) | ORAL | Status: DC
  Administered 2016-06-07 – 2016-06-10 (×7): 100 mg via ORAL
  Filled 2016-06-07 (×7): qty 1

## 2016-06-07 NOTE — ED Notes (Signed)
Pt upset that he is having to be "monitored" with our cardiac equipment. States that it is making too much noise and he cannot sleep. Attempted to explain reason for monitoring but pt is not satisfied with answer. Wants to speak with charge nurse. Ruthy Dick, RN asked to speak with pt.

## 2016-06-07 NOTE — ED Provider Notes (Signed)
Pt monitored overnight He had some episodes of bradycardia into 30s He is consistently staying in 40s   EKG Interpretation  Date/Time:  Saturday June 07 2016 06:00:21 EDT Ventricular Rate:  46 PR Interval:    QRS Duration: 104 QT Interval:  450 QTC Calculation: 394 R Axis:   62 Text Interpretation:  Marked sinus bradycardia Otherwise no significant change Confirmed by Christy Gentles  MD, Elenore Rota ([id number]) on 06/07/2016 6:05:27 AM      Repeat EKG reveals bradycardia in 40s He is symptomatic, reporting lightheaded with ambulation He now reports he took a whole bottle of propanolol I don't feel he will be able to be medically cleared for psychiatric admission Will admit medically for 24 hours to continue monitoring D/w dr gardner for admission to Dr Luan Pulling service    Ripley Fraise, MD 06/07/16 [phone]

## 2016-06-07 NOTE — Progress Notes (Signed)
Spoke with MD Pearletha Forge), stated that as long as pt HR comes back up into the 50's tomorrow he could be medically cleared. Repeat EKG ordered for 0500 tomorrow morning. Pt still wearing continuous heart monitor as well.  Called Catia from [facility] back and let her know that estimated medical clearing of pt would be tomorrow, so the next step in plan of care can be getting underway.  Will continue to monitor. Thanks.

## 2016-06-07 NOTE — Progress Notes (Signed)
Spoke with RN from poison control. Gave an update, still concerned about HR staying in the 40's. Suggested pt stay until HR gets back up to baseline (at least 60's.) Also recommended a repeat EKG in the am to see if PAC's are a baseline. Will continue to monitor.
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
## 2016-06-07 NOTE — ED Notes (Signed)
Report given to nurse taking over for room 308

## 2016-06-07 NOTE — ED Notes (Signed)
Notified Dr Christy Gentles that pt's HR 39-40. Oders for EKG given and completed.

## 2016-06-07 NOTE — ED Notes (Signed)
Pt continues to be upset that his cardiac monitoring is alarming. Dr. Christy Gentles aware and agrees that we cannot lower alarm rate. Pt wants to speak with charge nurse Hassan Rowan who is no longer here. Myriam Jacobson, RN in room to see pt. Pt feels like he is not being monitored since the alarms continue to "ring" and we are not going in his room constantly to silence them but have been doing so at the main monitoring desk.

## 2016-06-07 NOTE — Progress Notes (Signed)
Had a long discussion with pt wife. Pt wife was very tearful during the conversation. States she doesn't feel safe at home anymore for her or her husband's safety. States he has been in this extremely anxious/ suicidal state for a week now. Emotional support was given. Pt still stating that he still wishes he could end his life and hates he was unsuccessful with his plan. Will continue to monitor.

## 2016-06-07 NOTE — Progress Notes (Signed)
Spoke with pt's wife and she states that she spoke with his psychiatrists and phycologists from [place]. Pt's wife and [facility]'S requested that he be transported to [facility] under there psych care that he is already familiar with. (Per pt wife) Still awaiting for pt to be medically cleared. Once pt is cleared, Catia from [facility] needs to be called at [phone] or [phone] to plan his next step of care. Pt psychiatrist name from [place] is Rockne Menghini, MD. Can be reached at [phone]. Pt phycologist name from [place] is Nicanor Alcon and can be reached at [phone].  Will continue to monitor and update plan of care as needed.

## 2016-06-07 NOTE — ED Notes (Signed)
Pt ambulated 1 staff assist and with a [name] to restroom and back

## 2016-06-08 LAB — TROPONIN I: Troponin I: <0.03 ng/mL (ref <0.03)

## 2016-06-08 MED ORDER — ACETAMINOPHEN 325 MG PO TABS
650.0000 mg | ORAL_TABLET | Freq: Once | ORAL | Status: AC
  Administered 2016-06-08: 650 mg via ORAL
  Filled 2016-06-08: qty 2

## 2016-06-08 MED ORDER — CLOPIDOGREL BISULFATE 75 MG PO TABS
75.0000 mg | ORAL_TABLET | Freq: Every day | ORAL | Status: DC
  Administered 2016-06-08 – 2016-06-10 (×3): 75 mg via ORAL
  Filled 2016-06-08 (×3): qty 1

## 2016-06-08 NOTE — Progress Notes (Signed)
Adv pts nurse that per tele pt had a 2.07 second pause.

## 2016-06-08 NOTE — Progress Notes (Signed)
Subjective: He says he feels okay. He has no new complaints. His heart rate is still low and he had a 2.07 second pause yesterday. He is able to ambulate. He says these had some chest tightness but his EKG is okay. I suspect he's having some bronchospasm from his overdose of propranolol  Objective: Vital signs in last 24 hours: Temp:  [97.9 F (36.6 C)-98.9 F (37.2 C)] 97.9 F (36.6 C) (04/22 0605) Pulse Rate:  [36-41] 36 (04/22 0605) Resp:  [16] 16 (04/22 0605) BP: (108-142)/(61-77) 142/65 (04/22 0605) SpO2:  [96 %-98 %] 96 % (04/22 0605) Weight change:  Last BM Date: 06/06/16  Intake/Output from previous day: 04/21 0701 - 04/22 0700 In: 491.3 [P.O.:240; I.V.:251.3] Out: -   PHYSICAL EXAM General appearance: alert, cooperative and no distress Resp: clear to auscultation bilaterally Cardio: His heart is regular with a heart rate in the 40s now. GI: soft, non-tender; bowel sounds normal; no masses,  no organomegaly Extremities: extremities normal, atraumatic, no cyanosis or edema Skin warm and dry.  Lab Results:  Results for orders placed or performed during the hospital encounter of 06/06/16 (from the past 48 hour(s))  Urinalysis, Routine w reflex microscopic     Status: Abnormal   Collection Time: 06/06/16  7:13 PM  Result Value Ref Range   Color, Urine YELLOW YELLOW   APPearance CLEAR CLEAR   Specific Gravity, Urine 1.012 1.005 - 1.030   pH 6.0 5.0 - 8.0   Glucose, UA NEGATIVE NEGATIVE mg/dL   Hgb urine dipstick NEGATIVE NEGATIVE   Bilirubin Urine MODERATE (A) NEGATIVE   Ketones, ur NEGATIVE NEGATIVE mg/dL   Protein, ur NEGATIVE NEGATIVE mg/dL   Nitrite NEGATIVE NEGATIVE   Leukocytes, UA NEGATIVE NEGATIVE  Urine rapid drug screen (hosp performed)     Status: None   Collection Time: 06/06/16  7:13 PM  Result Value Ref Range   Opiates NONE DETECTED NONE DETECTED   Cocaine NONE DETECTED NONE DETECTED   Benzodiazepines NONE DETECTED NONE DETECTED   Amphetamines NONE  DETECTED NONE DETECTED   Tetrahydrocannabinol NONE DETECTED NONE DETECTED   Barbiturates NONE DETECTED NONE DETECTED    Comment:        DRUG SCREEN FOR MEDICAL PURPOSES ONLY.  IF CONFIRMATION IS NEEDED FOR ANY PURPOSE, NOTIFY LAB WITHIN 5 DAYS.        LOWEST DETECTABLE LIMITS FOR URINE DRUG SCREEN Drug Class       Cutoff (ng/mL) Amphetamine      1000 Barbiturate      200 Benzodiazepine   341 Tricyclics       962 Opiates          300 Cocaine          300 THC              50   Comprehensive metabolic panel     Status: Abnormal   Collection Time: 06/06/16  7:36 PM  Result Value Ref Range   Sodium 143 135 - 145 mmol/L   Potassium 3.7 3.5 - 5.1 mmol/L   Chloride 110 101 - 111 mmol/L   CO2 24 22 - 32 mmol/L   Glucose, Bld 105 (H) 65 - 99 mg/dL   BUN 19 6 - 20 mg/dL   Creatinine, Ser 0.76 0.61 - 1.24 mg/dL   Calcium 8.7 (L) 8.9 - 10.3 mg/dL   Total Protein 6.5 6.5 - 8.1 g/dL   Albumin 3.9 3.5 - 5.0 g/dL   AST 27 15 - 41 U/L  ALT 38 17 - 63 U/L   Alkaline Phosphatase 59 38 - 126 U/L   Total Bilirubin 0.4 0.3 - 1.2 mg/dL   GFR calc non Af Amer >60 >60 mL/min   GFR calc Af Amer >60 >60 mL/min    Comment: (NOTE) The eGFR has been calculated using the CKD EPI equation. This calculation has not been validated in all clinical situations. eGFR's persistently <60 mL/min signify possible Chronic Kidney Disease.    Anion gap 9 5 - 15  Ethanol     Status: Abnormal   Collection Time: 06/06/16  7:36 PM  Result Value Ref Range   Alcohol, Ethyl (B) 112 (H) <5 mg/dL    Comment:        LOWEST DETECTABLE LIMIT FOR SERUM ALCOHOL IS 5 mg/dL FOR MEDICAL PURPOSES ONLY   Lipase, blood     Status: None   Collection Time: 06/06/16  7:36 PM  Result Value Ref Range   Lipase 21 11 - 51 U/L  CBC with Differential     Status: Abnormal   Collection Time: 06/06/16  7:36 PM  Result Value Ref Range   WBC 6.8 4.0 - 10.5 K/uL   RBC 4.13 (L) 4.22 - 5.81 MIL/uL   Hemoglobin 13.7 13.0 - 17.0 g/dL    HCT 39.7 39.0 - 52.0 %   MCV 96.1 78.0 - 100.0 fL   MCH 33.2 26.0 - 34.0 pg   MCHC 34.5 30.0 - 36.0 g/dL   RDW 12.4 11.5 - 15.5 %   Platelets 228 150 - 400 K/uL   Neutrophils Relative % 57 %   Neutro Abs 4.0 1.7 - 7.7 K/uL   Lymphocytes Relative 30 %   Lymphs Abs 2.0 0.7 - 4.0 K/uL   Monocytes Relative 8 %   Monocytes Absolute 0.6 0.1 - 1.0 K/uL   Eosinophils Relative 4 %   Eosinophils Absolute 0.2 0.0 - 0.7 K/uL   Basophils Relative 1 %   Basophils Absolute 0.0 0.0 - 0.1 K/uL  Acetaminophen level     Status: Abnormal   Collection Time: 06/06/16  7:36 PM  Result Value Ref Range   Acetaminophen (Tylenol), Serum <10 (L) 10 - 30 ug/mL    Comment:        THERAPEUTIC CONCENTRATIONS VARY SIGNIFICANTLY. A RANGE OF 10-30 ug/mL MAY BE AN EFFECTIVE CONCENTRATION FOR MANY PATIENTS. HOWEVER, SOME ARE BEST TREATED AT CONCENTRATIONS OUTSIDE THIS RANGE. ACETAMINOPHEN CONCENTRATIONS >150 ug/mL AT 4 HOURS AFTER INGESTION AND >50 ug/mL AT 12 HOURS AFTER INGESTION ARE OFTEN ASSOCIATED WITH TOXIC REACTIONS.   Salicylate level     Status: None   Collection Time: 06/06/16  7:36 PM  Result Value Ref Range   Salicylate Lvl <5.6 2.8 - 30.0 mg/dL  CBC     Status: Abnormal   Collection Time: 06/07/16 10:16 AM  Result Value Ref Range   WBC 8.1 4.0 - 10.5 K/uL   RBC 4.00 (L) 4.22 - 5.81 MIL/uL   Hemoglobin 13.4 13.0 - 17.0 g/dL   HCT 39.0 39.0 - 52.0 %   MCV 97.5 78.0 - 100.0 fL   MCH 33.5 26.0 - 34.0 pg   MCHC 34.4 30.0 - 36.0 g/dL   RDW 12.5 11.5 - 15.5 %   Platelets 212 150 - 400 K/uL  Creatinine, serum     Status: None   Collection Time: 06/07/16 10:16 AM  Result Value Ref Range   Creatinine, Ser 0.93 0.61 - 1.24 mg/dL   GFR calc non Af Amer >  60 >60 mL/min   GFR calc Af Amer >60 >60 mL/min    Comment: (NOTE) The eGFR has been calculated using the CKD EPI equation. This calculation has not been validated in all clinical situations. eGFR's persistently <60 mL/min signify possible  Chronic Kidney Disease.     ABGS No results for input(s): PHART, PO2ART, TCO2, HCO3 in the last 72 hours.  Invalid input(s): PCO2 CULTURES No results found for this or any previous visit (from the past 240 hour(s)). Studies/Results: No results found.  Medications:  Prior to Admission:  Prescriptions Prior to Admission  Medication Sig Dispense Refill Last Dose  . acetaminophen (TYLENOL) 500 MG tablet Take 1,000 mg by mouth every 6 (six) hours as needed for mild pain or moderate pain. For pain   Past Week at Unknown time  . aspirin 325 MG EC tablet Take 325 mg by mouth daily.   06/06/2016 at Unknown time  . calcium citrate-vitamin D (CITRACAL+D) 315-200 MG-UNIT per tablet Take 2 tablets by mouth daily.   06/06/2016 at Unknown time  . clopidogrel (PLAVIX) 75 MG tablet Take 75 mg by mouth daily.    06/06/2016 at Unknown time  . Cyanocobalamin (VITAMIN B 12 PO) Take 1 tablet by mouth daily.   Past Week at Unknown time  . gabapentin (NEURONTIN) 300 MG capsule Take 600 mg by mouth at bedtime.   Past Week at Unknown time  . lamoTRIgine (LAMICTAL) 100 MG tablet Take 100 mg by mouth 2 (two) times daily.    06/06/2016 at Unknown time  . lisinopril (PRINIVIL,ZESTRIL) 40 MG tablet Take 40 mg by mouth daily.    06/06/2016 at Unknown time  . Multiple Vitamins-Minerals (CENTRUM SILVER ULTRA MENS PO) Take 1 tablet by mouth daily.   06/06/2016 at Unknown time  . Omega-3 Fatty Acids (FISH OIL) 1200 MG CAPS Take 1 capsule by mouth daily.    06/06/2016 at Unknown time  . propranolol (INDERAL) 20 MG tablet Take 20 mg by mouth 2 (two) times daily.   06/06/2016 at Unknown time  . thiamine (VITAMIN B-1) 100 MG tablet Take 100 mg by mouth daily.   Past Week at Unknown time  . venlafaxine XR (EFFEXOR-XR) 75 MG 24 hr capsule Take 225 mg by mouth daily.    06/06/2016 at Unknown time  . [DISCONTINUED] lamoTRIgine (LAMICTAL) 100 MG tablet Take by mouth.     . [DISCONTINUED] ALPRAZolam (XANAX) 0.5 MG tablet Take 0.5 mg by  mouth daily as needed for anxiety or sleep.    10/18/2015 at Unknown time  . [DISCONTINUED] propranolol (INDERAL) 20 MG tablet       Scheduled: . clopidogrel  75 mg Oral Daily  . enoxaparin (LOVENOX) injection  40 mg Subcutaneous Q24H  . lamoTRIgine  100 mg Oral BID  . sodium chloride flush  3 mL Intravenous Q12H  . venlafaxine XR  150 mg Oral Q breakfast   Continuous: . sodium chloride    . sodium chloride 75 mL/hr at 06/07/16 2214   KWI:OXBDZH chloride, hydrALAZINE, ondansetron **OR** ondansetron (ZOFRAN) IV, sodium chloride flush  Assesment: He was admitted with bradycardia after a suicide attempt with propranolol. He is not cleared medically yet. He and his wife would like him to go to [place]. Active Problems:   Essential hypertension   Bradycardia   Alcohol abuse   Intentional overdose of beta-adrenergic blocking drug ([place])   Suicide attempt ([place])   Overdose, intentional self-harm, initial encounter [facility])    Plan: Continue current treatments  LOS: 1 day   Joal Eakle L 06/08/2016, 10:18 AM

## 2016-06-08 NOTE — Progress Notes (Signed)
Social worker from [employer] called about 1615. She asked me if patient had been medically cleared today. I told her no because the patient has dropped down into the 40's today during sleep and the requirement was for him to maintain a HR in the 50's.

## 2016-06-09 LAB — TROPONIN I: Troponin I: <0.03 ng/mL (ref <0.03)

## 2016-06-09 MED ORDER — ASPIRIN 325 MG PO TABS
325.0000 mg | ORAL_TABLET | Freq: Every day | ORAL | Status: DC
  Administered 2016-06-09 – 2016-06-10 (×2): 325 mg via ORAL
  Filled 2016-06-09 (×2): qty 1

## 2016-06-09 NOTE — Progress Notes (Signed)
Patient anxious and agitated when informed of telephone restrictions per policy. Policy printed and given to patient per his request. Patient states that he has been able to use telephone with no limitations. Patient also c/o other events that have been inconsistent, like phone wasn't taken out of his room until second day, wife was not searched with wand and patient was not searched with wand at first. Steffanie Dunn, Agricultural consultant. Tim, [facility] notified of patient wanting to speak with him. Patient stating hospitalist is covering for Dr. Luan Pulling even after being informed that Dr. Jani Gravel is covering for Dr. Luan Pulling. This RN called Dr. Jani Gravel to inform him of patient c/o HA, asking for tylenol, and of worsening chest tightness/discomfort. Chest tightness/discomfort started yesterday and Dr. Luan Pulling is aware per patient. Orders entered per Dr. Jani Gravel. Patient encouraged to deep breath for relaxation techniques.Patient states that he has been attempting deep breathing and meditation. Anxiety slightly improved after emotional support from this RN. NT in room with patient for safety. Will continue to monitor.

## 2016-06-09 NOTE — Progress Notes (Signed)
Present with [name] for emotional/spiritual support. We discussed at length disconnection he has felt with his children and his wife [name] past year of health issues. Since I have known him over several years we focused on the emotional pain of his past with his family of origin along with his own health issues. Listened supportively and allowed deeper discussion around his emotional pain. Prayed with him.

## 2016-06-09 NOTE — Discharge Summary (Signed)
Physician Discharge Summary  Patient ID: [name] [name] MRN: [id number] DOB/AGE: 68-Jun-1952 68 y.o. Primary Care Physician:Shantinique Picazo L, MD Admit date: 06/06/2016 Discharge date: 06/09/2016    Discharge Diagnoses:   Active Problems:   Essential hypertension   Bradycardia   Alcohol abuse   Intentional overdose of beta-adrenergic blocking drug ([place])   Suicide attempt ([place])   Overdose, intentional self-harm, initial encounter ([place])   Allergies as of 06/09/2016      Reactions   Aricept [donepezil Hcl]    PASSED OUT/HYPOTENSION   Bee Venom Anaphylaxis, Hives   Namenda [memantine Hcl]    HYPOTENSION-PASSED OUT   Quinine Derivatives    PASSED OUT   Divalproex Sodium Other (See Comments)   Patient fainted but was taking this medication with another different drug.   Lamotrigine Nausea Only, Other (See Comments)   depression   Other Other (See Comments)   Oxcarbazepine Other (See Comments)   Statins    TGA/MIGRAINES      Medication List    STOP taking these medications   calcium citrate-vitamin D 315-200 MG-UNIT tablet Commonly known as:  CITRACAL+D   lisinopril 40 MG tablet Commonly known as:  PRINIVIL,ZESTRIL   propranolol 20 MG tablet Commonly known as:  INDERAL     TAKE these medications   acetaminophen 500 MG tablet Commonly known as:  TYLENOL Take 1,000 mg by mouth every 6 (six) hours as needed for mild pain or moderate pain. For pain   aspirin 325 MG EC tablet Take 325 mg by mouth daily.   CENTRUM SILVER ULTRA MENS PO Take 1 tablet by mouth daily.   clopidogrel 75 MG tablet Commonly known as:  PLAVIX Take 75 mg by mouth daily.   Fish Oil 1200 MG Caps Take 1 capsule by mouth daily.   gabapentin 300 MG capsule Commonly known as:  NEURONTIN Take 600 mg by mouth at bedtime.   lamoTRIgine 100 MG tablet Commonly known as:  LAMICTAL Take 100 mg by mouth 2 (two) times daily.   thiamine 100 MG tablet Commonly known as:  VITAMIN B-1 Take 100  mg by mouth daily.   venlafaxine XR 75 MG 24 hr capsule Commonly known as:  EFFEXOR-XR Take 225 mg by mouth daily.   VITAMIN B 12 PO Take 1 tablet by mouth daily.       Discharged Condition:Improved    Consults: Tele-psychiatry  Significant Diagnostic Studies: No results found.  Lab Results: Basic Metabolic Panel:  Recent Labs  06/06/16 1936 06/07/16 1016  NA 143  --   K 3.7  --   CL 110  --   CO2 24  --   GLUCOSE 105*  --   BUN 19  --   CREATININE 0.76 0.93  CALCIUM 8.7*  --    Liver Function Tests:  Recent Labs  06/06/16 1936  AST 27  ALT 38  ALKPHOS 59  BILITOT 0.4  PROT 6.5  ALBUMIN 3.9     CBC:  Recent Labs  06/06/16 1936 06/07/16 1016  WBC 6.8 8.1  NEUTROABS 4.0  --   HGB 13.7 13.4  HCT 39.7 39.0  MCV 96.1 97.5  PLT 228 212    No results found for this or any previous visit (from the past 240 hour(s)).   Hospital Course: This is a 66 year old who's had a long known history of anxiety and depression/posttraumatic stress disorder and who intentionally took approximately 120 mg propranolol tablets in an attempt to kill himself. He left a note.  He came to the emergency department after his wife found him and was noted to have bradycardia into the 30s. He did not have any high-grade AV block. He had tele-psychiatry evaluation and it was felt that he needed inpatient psychiatric care. He has been seeing a psychiatrist from [place] we are attempting to arrange psychiatric care at [facility]. He had some bradycardia but now has heart rates that are in the 50s when he is awake and dropping to the low 40s when he is asleep but he is totally asymptomatic.  Discharge Exam: Blood pressure 105/66, pulse (!) 45, temperature 97.9 F (36.6 C), temperature source Oral, resp. rate 17, height 6\' 1"  (1.854 m), weight 106.6 kg (235 lb), SpO2 99 %. He's awake and alert. Chest is clear. Heart is regular with bradycardia. Abdomen is soft.  Disposition: Transfer for  psychiatric inpatient care      Signed: Ferrell Claiborne L   06/09/2016, 9:06 AM

## 2016-06-09 NOTE — Progress Notes (Addendum)
LCSW following for disposition:  Inpatient recommendation: psych placement   LCSW sent referral to [facility] per MD and family request as patient is a current patient of providers in system Awaiting review and follow up if bed available and accepted. Call placed to wife Santiago Glad regarding clarification about current outpatient facility [facility] vs [place]. Wife was educated on next steps for placement. She is only wanting [facility] as his providers are at facility.  LCSW explained if there is no bed or unable to accept, then patient would need to be referred to other facilities. She reports she is calling the MD in effort to "pull strings" and get patient into facility.  LCSW gave contact information in case she had other questions or issues.   Referral sent to [facility].  Spoke with intake. Requested more information in which was re-faxed for review.  Will follow up once more is reviewed by MD.  LCSW received call from RN regarding patient having questions about placement and process. LCSW met with patient in room along with sitter and reviewed process and procedure with psychiatric placement.  Patient frustrated with communication at [facility] with regards to policies and voiced concerns.  LCSW provided validation and education and also voiced issues with charge RN. Explained plan remains to await patient being reviewed for placement. Wife arrived at hospital. Spoke at length with wife regarding progress with placement. Wife very understanding and appreciative of assistance.    Will also call over to [facility] and [place] for review to see if patient can be admitted for inpatient.  Spoke with [facility] regarding referral.  Patient needs updated vitals documented due to BP issues per [facility].  This was communicated to RN who is aware and working to complete.  AC will review chart again this afternoon.  Beds are limited due to construction, but actively working on coordination of care.   Will continue to follow.  Lane Hacker,  MSW Clinical Social Work: Printmaker Coverage for :  [phone]

## 2016-06-09 NOTE — Progress Notes (Signed)
Subjective: He feels ok. Admitted after intentional overdose with propranolol. He complained of a headache yesterday. He still has some chest tightness. His EKG does not show anything acute. Does show sinus bradycardia with occasional PACs. He has no other new complaints. He is still anxious. His heart rate dropped to 45 while he was asleep and that was asymptomatic. No high degree block. When he is awake his heart rates in the high 50s.  Objective: Vital signs in last 24 hours: Temp:  [97.9 F (36.6 C)-98.7 F (37.1 C)] 97.9 F (36.6 C) (04/23 0630) Pulse Rate:  [45] 45 (04/23 0630) Resp:  [16-17] 17 (04/23 0630) BP: (105-143)/(61-81) 105/66 (04/23 0630) SpO2:  [97 %-99 %] 99 % (04/23 0630) Weight change:  Last BM Date: 06/08/16  Intake/Output from previous day: 04/22 0701 - 04/23 0700 In: 1620 [P.O.:720; I.V.:900] Out: -   PHYSICAL EXAM General appearance: alert, cooperative and no distress Resp: clear to auscultation bilaterally Cardio: regular with bradycardia. No abnormal heart sounds now GI: soft, non-tender; bowel sounds normal; no masses,  no organomegaly Extremities: extremities normal, atraumatic, no cyanosis or edema Skin warm and dry. Mucous membranes are moist  Lab Results:  Results for orders placed or performed during the hospital encounter of 06/06/16 (from the past 48 hour(s))  CBC     Status: Abnormal   Collection Time: 06/07/16 10:16 AM  Result Value Ref Range   WBC 8.1 4.0 - 10.5 K/uL   RBC 4.00 (L) 4.22 - 5.81 MIL/uL   Hemoglobin 13.4 13.0 - 17.0 g/dL   HCT 39.0 39.0 - 52.0 %   MCV 97.5 78.0 - 100.0 fL   MCH 33.5 26.0 - 34.0 pg   MCHC 34.4 30.0 - 36.0 g/dL   RDW 12.5 11.5 - 15.5 %   Platelets 212 150 - 400 K/uL  Creatinine, serum     Status: None   Collection Time: 06/07/16 10:16 AM  Result Value Ref Range   Creatinine, Ser 0.93 0.61 - 1.24 mg/dL   GFR calc non Af Amer >60 >60 mL/min   GFR calc Af Amer >60 >60 mL/min    Comment: (NOTE) The eGFR  has been calculated using the CKD EPI equation. This calculation has not been validated in all clinical situations. eGFR's persistently <60 mL/min signify possible Chronic Kidney Disease.   Troponin I     Status: None   Collection Time: 06/08/16  9:16 PM  Result Value Ref Range   Troponin I <0.03 <0.03 ng/mL  Troponin I     Status: None   Collection Time: 06/09/16  5:12 AM  Result Value Ref Range   Troponin I <0.03 <0.03 ng/mL    ABGS No results for input(s): PHART, PO2ART, TCO2, HCO3 in the last 72 hours.  Invalid input(s): PCO2 CULTURES No results found for this or any previous visit (from the past 240 hour(s)). Studies/Results: No results found.  Medications:  Prior to Admission:  Prescriptions Prior to Admission  Medication Sig Dispense Refill Last Dose  . acetaminophen (TYLENOL) 500 MG tablet Take 1,000 mg by mouth every 6 (six) hours as needed for mild pain or moderate pain. For pain   Past Week at Unknown time  . aspirin 325 MG EC tablet Take 325 mg by mouth daily.   06/06/2016 at Unknown time  . calcium citrate-vitamin D (CITRACAL+D) 315-200 MG-UNIT per tablet Take 2 tablets by mouth daily.   06/06/2016 at Unknown time  . clopidogrel (PLAVIX) 75 MG tablet Take 75 mg by  mouth daily.    06/06/2016 at Unknown time  . Cyanocobalamin (VITAMIN B 12 PO) Take 1 tablet by mouth daily.   Past Week at Unknown time  . gabapentin (NEURONTIN) 300 MG capsule Take 600 mg by mouth at bedtime.   Past Week at Unknown time  . lamoTRIgine (LAMICTAL) 100 MG tablet Take 100 mg by mouth 2 (two) times daily.    06/06/2016 at Unknown time  . lisinopril (PRINIVIL,ZESTRIL) 40 MG tablet Take 40 mg by mouth daily.    06/06/2016 at Unknown time  . Multiple Vitamins-Minerals (CENTRUM SILVER ULTRA MENS PO) Take 1 tablet by mouth daily.   06/06/2016 at Unknown time  . Omega-3 Fatty Acids (FISH OIL) 1200 MG CAPS Take 1 capsule by mouth daily.    06/06/2016 at Unknown time  . propranolol (INDERAL) 20 MG tablet  Take 20 mg by mouth 2 (two) times daily.   06/06/2016 at Unknown time  . thiamine (VITAMIN B-1) 100 MG tablet Take 100 mg by mouth daily.   Past Week at Unknown time  . venlafaxine XR (EFFEXOR-XR) 75 MG 24 hr capsule Take 225 mg by mouth daily.    06/06/2016 at Unknown time  . [DISCONTINUED] lamoTRIgine (LAMICTAL) 100 MG tablet Take by mouth.     . [DISCONTINUED] ALPRAZolam (XANAX) 0.5 MG tablet Take 0.5 mg by mouth daily as needed for anxiety or sleep.    10/18/2015 at Unknown time  . [DISCONTINUED] propranolol (INDERAL) 20 MG tablet       Scheduled: . clopidogrel  75 mg Oral Daily  . enoxaparin (LOVENOX) injection  40 mg Subcutaneous Q24H  . lamoTRIgine  100 mg Oral BID  . sodium chloride flush  3 mL Intravenous Q12H  . venlafaxine XR  150 mg Oral Q breakfast   Continuous: . sodium chloride    . sodium chloride 75 mL/hr at 06/09/16 0053   FJK:NIOCOD chloride, hydrALAZINE, ondansetron **OR** ondansetron (ZOFRAN) IV, sodium chloride flush  Assesment: He was admitted with suicide attempt by intentional overdose of propranolol. He apparently took approximately 120 mg propranolol immediate release tablets. His heart rate has been in the 30s on admission now he is in the 40s and 50s. The only time that his heart rate is in the 40s now is when he is asleep and he is asymptomatic. He had some chest tightness that I think is likely related to bronchospasm from his beta blocker. EKG does not show any sort of ischemic changes. He has not had any AV block. Active Problems:   Essential hypertension   Bradycardia   Alcohol abuse   Intentional overdose of beta-adrenergic blocking drug ([place])   Suicide attempt ([place])   Overdose, intentional self-harm, initial encounter [facility])    Plan: He is medically cleared for transfer to psychiatric unit    LOS: 2 days   Baelyn Doring L 06/09/2016, 7:53 AM

## 2016-06-10 ENCOUNTER — Inpatient Hospital Stay (HOSPITAL_COMMUNITY)
Admission: AD | Admit: 2016-06-10 | Discharge: 2016-06-20 | DRG: 885 | Disposition: A | Discharge status: Other Acute Inpatient Hospital | Payer: Medicare Other | Source: Intra-hospital | Attending: Psychiatry | Admitting: Psychiatry

## 2016-06-10 ENCOUNTER — Encounter (HOSPITAL_COMMUNITY): Payer: Self-pay | Admitting: *Deleted

## 2016-06-10 DIAGNOSIS — Z7982 Long term (current) use of aspirin: Secondary | ICD-10-CM | POA: Diagnosis not present

## 2016-06-10 DIAGNOSIS — Z818 Family history of other mental and behavioral disorders: Secondary | ICD-10-CM | POA: Diagnosis not present

## 2016-06-10 DIAGNOSIS — F339 Major depressive disorder, recurrent, unspecified: Secondary | ICD-10-CM | POA: Diagnosis present

## 2016-06-10 DIAGNOSIS — G8929 Other chronic pain: Secondary | ICD-10-CM | POA: Diagnosis present

## 2016-06-10 DIAGNOSIS — G4730 Sleep apnea, unspecified: Secondary | ICD-10-CM | POA: Diagnosis present

## 2016-06-10 DIAGNOSIS — F411 Generalized anxiety disorder: Secondary | ICD-10-CM | POA: Diagnosis present

## 2016-06-10 DIAGNOSIS — F482 Pseudobulbar affect: Secondary | ICD-10-CM | POA: Diagnosis present

## 2016-06-10 DIAGNOSIS — F1094 Alcohol use, unspecified with alcohol-induced mood disorder: Secondary | ICD-10-CM | POA: Diagnosis present

## 2016-06-10 DIAGNOSIS — E785 Hyperlipidemia, unspecified: Secondary | ICD-10-CM | POA: Diagnosis present

## 2016-06-10 DIAGNOSIS — G4700 Insomnia, unspecified: Secondary | ICD-10-CM | POA: Diagnosis present

## 2016-06-10 DIAGNOSIS — M1990 Unspecified osteoarthritis, unspecified site: Secondary | ICD-10-CM | POA: Diagnosis present

## 2016-06-10 DIAGNOSIS — R51 Headache: Secondary | ICD-10-CM | POA: Diagnosis present

## 2016-06-10 DIAGNOSIS — I69354 Hemiplegia and hemiparesis following cerebral infarction affecting left non-dominant side: Secondary | ICD-10-CM | POA: Diagnosis not present

## 2016-06-10 DIAGNOSIS — Z888 Allergy status to other drugs, medicaments and biological substances status: Secondary | ICD-10-CM

## 2016-06-10 DIAGNOSIS — Z87891 Personal history of nicotine dependence: Secondary | ICD-10-CM

## 2016-06-10 DIAGNOSIS — F429 Obsessive-compulsive disorder, unspecified: Secondary | ICD-10-CM | POA: Diagnosis present

## 2016-06-10 DIAGNOSIS — N529 Male erectile dysfunction, unspecified: Secondary | ICD-10-CM | POA: Diagnosis present

## 2016-06-10 DIAGNOSIS — E669 Obesity, unspecified: Secondary | ICD-10-CM | POA: Diagnosis present

## 2016-06-10 DIAGNOSIS — Z7902 Long term (current) use of antithrombotics/antiplatelets: Secondary | ICD-10-CM

## 2016-06-10 DIAGNOSIS — T1491XA Suicide attempt, initial encounter: Secondary | ICD-10-CM | POA: Diagnosis not present

## 2016-06-10 DIAGNOSIS — F419 Anxiety disorder, unspecified: Secondary | ICD-10-CM | POA: Diagnosis present

## 2016-06-10 DIAGNOSIS — F329 Major depressive disorder, single episode, unspecified: Secondary | ICD-10-CM | POA: Diagnosis not present

## 2016-06-10 DIAGNOSIS — M25519 Pain in unspecified shoulder: Secondary | ICD-10-CM | POA: Diagnosis present

## 2016-06-10 DIAGNOSIS — Z87820 Personal history of traumatic brain injury: Secondary | ICD-10-CM | POA: Diagnosis not present

## 2016-06-10 DIAGNOSIS — Z8679 Personal history of other diseases of the circulatory system: Secondary | ICD-10-CM

## 2016-06-10 DIAGNOSIS — Z8774 Personal history of (corrected) congenital malformations of heart and circulatory system: Secondary | ICD-10-CM

## 2016-06-10 DIAGNOSIS — Z638 Other specified problems related to primary support group: Secondary | ICD-10-CM

## 2016-06-10 DIAGNOSIS — Z7901 Long term (current) use of anticoagulants: Secondary | ICD-10-CM | POA: Diagnosis not present

## 2016-06-10 DIAGNOSIS — E7800 Pure hypercholesterolemia, unspecified: Secondary | ICD-10-CM | POA: Diagnosis present

## 2016-06-10 DIAGNOSIS — Y905 Blood alcohol level of 100-119 mg/100 ml: Secondary | ICD-10-CM | POA: Diagnosis present

## 2016-06-10 DIAGNOSIS — M4712 Other spondylosis with myelopathy, cervical region: Secondary | ICD-10-CM | POA: Diagnosis present

## 2016-06-10 DIAGNOSIS — G43909 Migraine, unspecified, not intractable, without status migrainosus: Secondary | ICD-10-CM | POA: Diagnosis present

## 2016-06-10 DIAGNOSIS — I2510 Atherosclerotic heart disease of native coronary artery without angina pectoris: Secondary | ICD-10-CM | POA: Diagnosis present

## 2016-06-10 DIAGNOSIS — F332 Major depressive disorder, recurrent severe without psychotic features: Secondary | ICD-10-CM | POA: Diagnosis present

## 2016-06-10 DIAGNOSIS — F410 Panic disorder [episodic paroxysmal anxiety] without agoraphobia: Secondary | ICD-10-CM | POA: Diagnosis present

## 2016-06-10 DIAGNOSIS — Z91030 Bee allergy status: Secondary | ICD-10-CM

## 2016-06-10 DIAGNOSIS — G5600 Carpal tunnel syndrome, unspecified upper limb: Secondary | ICD-10-CM | POA: Diagnosis present

## 2016-06-10 DIAGNOSIS — Z79899 Other long term (current) drug therapy: Secondary | ICD-10-CM

## 2016-06-10 DIAGNOSIS — I10 Essential (primary) hypertension: Secondary | ICD-10-CM | POA: Diagnosis present

## 2016-06-10 DIAGNOSIS — T447X2A Poisoning by beta-adrenoreceptor antagonists, intentional self-harm, initial encounter: Secondary | ICD-10-CM | POA: Diagnosis present

## 2016-06-10 DIAGNOSIS — F39 Unspecified mood [affective] disorder: Secondary | ICD-10-CM | POA: Diagnosis not present

## 2016-06-10 DIAGNOSIS — R45851 Suicidal ideations: Secondary | ICD-10-CM | POA: Diagnosis present

## 2016-06-10 DIAGNOSIS — I739 Peripheral vascular disease, unspecified: Secondary | ICD-10-CM | POA: Diagnosis present

## 2016-06-10 MED ORDER — CHLORDIAZEPOXIDE HCL 25 MG PO CAPS
25.0000 mg | ORAL_CAPSULE | Freq: Four times a day (QID) | ORAL | Status: AC | PRN
  Administered 2016-06-13: 25 mg via ORAL
  Filled 2016-06-10: qty 1

## 2016-06-10 MED ORDER — CLOPIDOGREL BISULFATE 75 MG PO TABS
75.0000 mg | ORAL_TABLET | Freq: Every day | ORAL | Status: DC
  Administered 2016-06-11 – 2016-06-20 (×10): 75 mg via ORAL
  Filled 2016-06-10 (×12): qty 1

## 2016-06-10 MED ORDER — LOPERAMIDE HCL 2 MG PO CAPS: 2.0000 mg | ORAL_CAPSULE | ORAL | Status: AC | PRN

## 2016-06-10 MED ORDER — VITAMIN B-1 100 MG PO TABS
100.0000 mg | ORAL_TABLET | Freq: Every day | ORAL | Status: DC
  Administered 2016-06-13 – 2016-06-14 (×2): 100 mg via ORAL
  Filled 2016-06-10 (×12): qty 1

## 2016-06-10 MED ORDER — CHLORDIAZEPOXIDE HCL 25 MG PO CAPS: 25.0000 mg | ORAL_CAPSULE | ORAL | Status: DC

## 2016-06-10 MED ORDER — OMEGA-3-ACID ETHYL ESTERS 1 G PO CAPS
1.0000 g | ORAL_CAPSULE | Freq: Every day | ORAL | Status: DC
  Administered 2016-06-13: 1 g via ORAL
  Filled 2016-06-10 (×12): qty 1

## 2016-06-10 MED ORDER — HYDROXYZINE HCL 25 MG PO TABS: 25.0000 mg | ORAL_TABLET | Freq: Four times a day (QID) | ORAL | Status: AC | PRN

## 2016-06-10 MED ORDER — TRAZODONE HCL 50 MG PO TABS
50.0000 mg | ORAL_TABLET | Freq: Every evening | ORAL | Status: DC | PRN
  Administered 2016-06-10: 50 mg via ORAL
  Filled 2016-06-10 (×7): qty 1

## 2016-06-10 MED ORDER — GABAPENTIN 300 MG PO CAPS
600.0000 mg | ORAL_CAPSULE | Freq: Every day | ORAL | Status: DC
  Filled 2016-06-10 (×2): qty 2

## 2016-06-10 MED ORDER — LAMOTRIGINE 100 MG PO TABS
100.0000 mg | ORAL_TABLET | Freq: Two times a day (BID) | ORAL | Status: DC
  Administered 2016-06-10 – 2016-06-20 (×21): 100 mg via ORAL
  Filled 2016-06-10 (×26): qty 1

## 2016-06-10 MED ORDER — VENLAFAXINE HCL ER 75 MG PO CP24
225.0000 mg | ORAL_CAPSULE | Freq: Every day | ORAL | Status: DC
  Filled 2016-06-10: qty 1

## 2016-06-10 MED ORDER — MAGNESIUM HYDROXIDE 400 MG/5ML PO SUSP: 30.0000 mL | Freq: Every day | ORAL | Status: DC | PRN

## 2016-06-10 MED ORDER — CHLORDIAZEPOXIDE HCL 25 MG PO CAPS: 25.0000 mg | ORAL_CAPSULE | Freq: Every day | ORAL | Status: DC

## 2016-06-10 MED ORDER — CENTRUM SILVER ULTRA MENS PO TABS
ORAL_TABLET | Freq: Every day | ORAL | Status: DC
Start: 2016-06-11 — End: 2016-06-10

## 2016-06-10 MED ORDER — ALUM & MAG HYDROXIDE-SIMETH 200-200-20 MG/5ML PO SUSP: 30.0000 mL | ORAL | Status: DC | PRN

## 2016-06-10 MED ORDER — ASPIRIN EC 325 MG PO TBEC
325.0000 mg | DELAYED_RELEASE_TABLET | Freq: Every day | ORAL | Status: DC
  Administered 2016-06-11 – 2016-06-20 (×10): 325 mg via ORAL
  Filled 2016-06-10 (×13): qty 1

## 2016-06-10 MED ORDER — ONDANSETRON 4 MG PO TBDP: 4.0000 mg | ORAL_TABLET | Freq: Four times a day (QID) | ORAL | Status: AC | PRN

## 2016-06-10 MED ORDER — ADULT MULTIVITAMIN W/MINERALS CH
1.0000 | ORAL_TABLET | Freq: Every day | ORAL | Status: DC
  Administered 2016-06-13 – 2016-06-14 (×2): 1 via ORAL
  Filled 2016-06-10 (×12): qty 1

## 2016-06-10 MED ORDER — ACETAMINOPHEN 325 MG PO TABS
650.0000 mg | ORAL_TABLET | Freq: Four times a day (QID) | ORAL | Status: DC | PRN
  Administered 2016-06-10: 650 mg via ORAL
  Filled 2016-06-10: qty 2

## 2016-06-10 MED ORDER — ACETAMINOPHEN 325 MG PO TABS
650.0000 mg | ORAL_TABLET | Freq: Four times a day (QID) | ORAL | Status: DC | PRN
  Administered 2016-06-11 – 2016-06-20 (×16): 650 mg via ORAL
  Filled 2016-06-10 (×17): qty 2

## 2016-06-10 MED ORDER — VENLAFAXINE HCL ER 150 MG PO CP24
150.0000 mg | ORAL_CAPSULE | Freq: Every day | ORAL | Status: DC
  Administered 2016-06-11 – 2016-06-12 (×2): 150 mg via ORAL
  Filled 2016-06-10 (×5): qty 1

## 2016-06-10 MED ORDER — CHLORDIAZEPOXIDE HCL 25 MG PO CAPS
25.0000 mg | ORAL_CAPSULE | Freq: Four times a day (QID) | ORAL | Status: DC
  Administered 2016-06-10: 25 mg via ORAL
  Filled 2016-06-10 (×2): qty 1

## 2016-06-10 MED ORDER — CHLORDIAZEPOXIDE HCL 25 MG PO CAPS: 25.0000 mg | ORAL_CAPSULE | Freq: Three times a day (TID) | ORAL | Status: DC

## 2016-06-10 MED ORDER — ACETAMINOPHEN 500 MG PO TABS: 1000.0000 mg | ORAL_TABLET | Freq: Four times a day (QID) | ORAL | Status: DC | PRN

## 2016-06-10 NOTE — Progress Notes (Signed)
Pt c/o headache and said that he would like some tylenol. Paged Dr. Luan Pulling and received a verbal order for Tylenol tabs 650 mg Q6hours PRN.

## 2016-06-10 NOTE — Progress Notes (Signed)
Patient ID: [name], male   DOB: 1950/10/07, 66 y.o.   MRN: [id number] Per State regulations 482.30 this chart was reviewed for medical necessity with respect to the patient's admission/duration of stay.    Next review date: 06/13/16  Debarah Crape, BSN, RN-BC  Case Manager

## 2016-06-10 NOTE — Plan of Care (Signed)
Problem: Education: Goal: Utilization of techniques to improve thought processes will improve Outcome: Progressing Nurse discussed depression/anxiety/coping skills with patient.    

## 2016-06-10 NOTE — Progress Notes (Signed)
Subjective: Efforts are still underway to get him transferred for inpatient psychiatric care. No new problems. He says his chest tightness is better. Heart rate over the last 24 hours listed 51-60. No high-grade arrhythmias.  Objective: Vital signs in last 24 hours: Temp:  [98 F (36.7 C)-98.2 F (36.8 C)] 98 F (36.7 C) (04/24 0533) Pulse Rate:  [51-60] 58 (04/24 0533) Resp:  [16-18] 18 (04/24 0533) BP: (122-149)/(66-81) 122/71 (04/24 0533) SpO2:  [95 %-98 %] 98 % (04/24 0533) Weight change:  Last BM Date: 06/08/16  Intake/Output from previous day: 04/23 0701 - 04/24 0700 In: 2352.5 [P.O.:720; I.V.:1632.5] Out: -   PHYSICAL EXAM General appearance: alert, cooperative and no distress Resp: clear to auscultation bilaterally Cardio: regular rate and rhythm, S1, S2 normal, no murmur, click, rub or gallop GI: soft, non-tender; bowel sounds normal; no masses,  no organomegaly Extremities: extremities normal, atraumatic, no cyanosis or edema Skin warm and dry.  Lab Results:  Results for orders placed or performed during the hospital encounter of 06/06/16 (from the past 48 hour(s))  Troponin I     Status: None   Collection Time: 06/08/16  9:16 PM  Result Value Ref Range   Troponin I <0.03 <0.03 ng/mL  Troponin I     Status: None   Collection Time: 06/09/16  5:12 AM  Result Value Ref Range   Troponin I <0.03 <0.03 ng/mL    ABGS No results for input(s): PHART, PO2ART, TCO2, HCO3 in the last 72 hours.  Invalid input(s): PCO2 CULTURES No results found for this or any previous visit (from the past 240 hour(s)). Studies/Results: No results found.  Medications:  Prior to Admission:  Prescriptions Prior to Admission  Medication Sig Dispense Refill Last Dose  . acetaminophen (TYLENOL) 500 MG tablet Take 1,000 mg by mouth every 6 (six) hours as needed for mild pain or moderate pain. For pain   Past Week at Unknown time  . aspirin 325 MG EC tablet Take 325 mg by mouth daily.    06/06/2016 at Unknown time  . calcium citrate-vitamin D (CITRACAL+D) 315-200 MG-UNIT per tablet Take 2 tablets by mouth daily.   06/06/2016 at Unknown time  . clopidogrel (PLAVIX) 75 MG tablet Take 75 mg by mouth daily.    06/06/2016 at Unknown time  . Cyanocobalamin (VITAMIN B 12 PO) Take 1 tablet by mouth daily.   Past Week at Unknown time  . gabapentin (NEURONTIN) 300 MG capsule Take 600 mg by mouth at bedtime.   Past Week at Unknown time  . lamoTRIgine (LAMICTAL) 100 MG tablet Take 100 mg by mouth 2 (two) times daily.    06/06/2016 at Unknown time  . lisinopril (PRINIVIL,ZESTRIL) 40 MG tablet Take 40 mg by mouth daily.    06/06/2016 at Unknown time  . Multiple Vitamins-Minerals (CENTRUM SILVER ULTRA MENS PO) Take 1 tablet by mouth daily.   06/06/2016 at Unknown time  . Omega-3 Fatty Acids (FISH OIL) 1200 MG CAPS Take 1 capsule by mouth daily.    06/06/2016 at Unknown time  . propranolol (INDERAL) 20 MG tablet Take 20 mg by mouth 2 (two) times daily.   06/06/2016 at Unknown time  . thiamine (VITAMIN B-1) 100 MG tablet Take 100 mg by mouth daily.   Past Week at Unknown time  . venlafaxine XR (EFFEXOR-XR) 75 MG 24 hr capsule Take 225 mg by mouth daily.    06/06/2016 at Unknown time  . [DISCONTINUED] lamoTRIgine (LAMICTAL) 100 MG tablet Take by mouth.     . [  DISCONTINUED] ALPRAZolam (XANAX) 0.5 MG tablet Take 0.5 mg by mouth daily as needed for anxiety or sleep.    10/18/2015 at Unknown time  . [DISCONTINUED] propranolol (INDERAL) 20 MG tablet       Scheduled: . aspirin  325 mg Oral Daily  . clopidogrel  75 mg Oral Daily  . enoxaparin (LOVENOX) injection  40 mg Subcutaneous Q24H  . lamoTRIgine  100 mg Oral BID  . sodium chloride flush  3 mL Intravenous Q12H  . venlafaxine XR  150 mg Oral Q breakfast   Continuous: . sodium chloride    . sodium chloride 75 mL/hr at 06/10/16 0431   OYD:XAJOIN chloride, hydrALAZINE, ondansetron **OR** ondansetron (ZOFRAN) IV, sodium chloride flush  Assesment: He had  an overdose with propranolol in a suicide attempt. His heart rate is better. We are awaiting inpatient psychiatric care. Active Problems:   Essential hypertension   Bradycardia   Alcohol abuse   Intentional overdose of beta-adrenergic blocking drug ([place])   Suicide attempt ([place])   Overdose, intentional self-harm, initial encounter ([place])    Plan: Continue treatments. Hopefully transfer today    LOS: 3 days   Dewayne Jurek L 06/10/2016, 8:38 AM

## 2016-06-10 NOTE — Progress Notes (Signed)
D: Pt was in bed in his room upon initial approach.  Pt presents with irritable affect and mood.  He reports he is trying to get some sleep.  Pt minimizes his alcohol use, reporting he has "only had 5 drinks since January 2017."  He states "I'm not gonna have any withdrawal."  Pt denies SI/HI, denies hallucinations, denies pain.  Pt has been isolative to his room tonight.  He did not attend evening group.   A: Introduced self to pt.  Actively listened to pt and offered support and encouragement. On-site provider contacted for admission orders and clarification orders.  Pt reports he does not take Neurontin.  Medications administered per order.    R: Pt is safe on the unit.  Pt is compliant with medications.  Pt verbally contracts for safety.  Will continue to monitor and assess.

## 2016-06-10 NOTE — Tx Team (Signed)
Initial Treatment Plan 06/10/2016 7:40 PM [name] [id number]    PATIENT STRESSORS: Health problems Substance abuse Traumatic event   PATIENT STRENGTHS: Ability for insight Average or above average intelligence Communication skills Financial means General fund of knowledge Motivation for treatment/growth Supportive family/friends   PATIENT IDENTIFIED PROBLEMS: "anxiety"  "depression"  "suicide thoughts"  "alcohol"  "panic"             DISCHARGE CRITERIA:  Ability to meet basic life and health needs Adequate post-discharge living arrangements Improved stabilization in mood, thinking, and/or behavior Medical problems require only outpatient monitoring Motivation to continue treatment in a less acute level of care Need for constant or close observation no longer present Reduction of life-threatening or endangering symptoms to within safe limits Safe-care adequate arrangements made Verbal commitment to aftercare and medication compliance Withdrawal symptoms are absent or subacute and managed without 24-hour nursing intervention  PRELIMINARY DISCHARGE PLAN: Attend aftercare/continuing care group Attend PHP/IOP Attend 12-step recovery group Outpatient therapy Return to previous living arrangement  PATIENT/FAMILY INVOLVEMENT: This treatment plan has been presented to and reviewed with the patient, [name].  The patient and family have been given the opportunity to ask questions and make suggestions.  Grayland Ormond Greenfield, RN 06/10/2016, 7:40 PM

## 2016-06-10 NOTE — Progress Notes (Signed)
Psychoeducational Group Note  Date:  06/10/2016 Time:  2336  Group Topic/Focus:  Wrap-Up Group:   The focus of this group is to help patients review their daily goal of treatment and discuss progress on daily workbooks.  Participation Level: Did Not Attend  Participation Quality:  Not Applicable  Affect:  Not Applicable  Cognitive:  Not Applicable  Insight:  Not Applicable  Engagement in Group: Not Applicable  Additional Comments:  The patient refused to attend this evening's group since he was not feeling well.   Archie Balboa S 06/10/2016, 11:36 PM
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
## 2016-06-10 NOTE — Progress Notes (Signed)
Patient transferred to [facility] via [facility] transportation.

## 2016-06-10 NOTE — Progress Notes (Addendum)
LCSW following for disposition. Patient has been accepted to [facility] inpatient (tentative bed:  404 bed 2)  LCSW made unit CSW available. Will await call from [facility] at [facility] when bed is ready and patient can be transported.  Patient will sign voluntary forms and fax for [id number] Report:  [phone]  Patient will transport by [place] later this afternoon.  10:39 AM Patient asked to complete voluntary forms in which he refuses until he learns if he was declined by [facility]. Call placed to admissions at [facility] who cannot confirm or deny if he was declined, asked to resend for review. Will re-send referral  As patient requested, however he is educated that if bed at [facility] becomes available before [facility] is able to review, he will have to be discharged to [facility] as he is ready for discharge and bed is available and by policy we must go with first available bed.   2:27 PM Patient can be transferred to [facility] after 3pm today.  Accepting attending: Dr. Parke Poisson  Patient to go by Silva Bandy, MSW Clinical Social Work: System Wide Float Coverage for :  [phone]

## 2016-06-10 NOTE — Progress Notes (Addendum)
Called report to [facility] nurse, Rise Paganini.

## 2016-06-11 DIAGNOSIS — T1491XA Suicide attempt, initial encounter: Secondary | ICD-10-CM

## 2016-06-11 DIAGNOSIS — F332 Major depressive disorder, recurrent severe without psychotic features: Secondary | ICD-10-CM

## 2016-06-11 DIAGNOSIS — F1094 Alcohol use, unspecified with alcohol-induced mood disorder: Secondary | ICD-10-CM

## 2016-06-11 DIAGNOSIS — T447X2A Poisoning by beta-adrenoreceptor antagonists, intentional self-harm, initial encounter: Secondary | ICD-10-CM

## 2016-06-11 MED ORDER — TRAZODONE HCL 50 MG PO TABS: 50.0000 mg | ORAL_TABLET | Freq: Every evening | ORAL | Status: DC | PRN

## 2016-06-11 NOTE — Progress Notes (Signed)
Recreation Therapy Notes  Date: 06/11/16 Time: 0930 Location: 400 Hall Dayroom  Group Topic: Stress Management  Goal Area(s) Addresses:  Patient will verbalize importance of using healthy stress management.  Patient will identify positive emotions associated with healthy stress management.   Intervention: Stress Management  Activity :  Meditation.  LRT introduced the stress management technique of meditation.  LRT played a meditation focused on resilience from the Calm app to patients to fully participate in the activity.  Patients were to follow along as the meditation played to engage in the technique.  Education:  Stress Management, Discharge Planning.   Education Outcome: Acknowledges edcuation/In group clarification offered/Needs additional education  Clinical Observations/Feedback: Pt did not attend group.   Victorino Sparrow, LRT/CTRS         Ria Comment, Nikkole Placzek A 06/11/2016 11:40 AM
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
## 2016-06-11 NOTE — Progress Notes (Signed)
[facility] Group Notes:  (Nursing/MHT/Case Management/Adjunct)  Date:  06/11/2016  Time:  11:07 PM  Type of Therapy:  Psychoeducational Skills  Participation Level:  Active  Participation Quality:  Appropriate  Affect:  Flat  Cognitive:  Appropriate  Insight:  Improving  Engagement in Group:  Improving  Modes of Intervention:  Education  Summary of Progress/Problems: The patient shared with the group that he had a good start to his day initially by speaking with his doctor and Education officer, museum. His day did not end well for him since he did not receive any phone calls from his family. His goal for tomorrow is to deal with his anger and stress issues.   Archie Balboa S 06/11/2016, 11:07 PM

## 2016-06-11 NOTE — Progress Notes (Signed)
Patient has been isolative to room.  Patient complained of increased depression and low mood but denies SI, HI and AVH.  Patient is concerned about DNR paperwork as well as power of attorney.  Patient was directed to speak with treatment team about these issues.  Patient has had no incident of behavioral dyscontrol.   Assess for safety, offer medications as prescribed, engage patient in 1:1 staff talks.

## 2016-06-11 NOTE — [facility] Group Notes (Signed)
[facility] LCSW Aftercare Discharge Planning Group Note  06/11/2016 8:45 AM  Pt did not attend, declined invitation.   Adriana Reams, LCSW 06/11/2016 9:50 AM

## 2016-06-11 NOTE — [facility] Group Notes (Signed)
[place] LCSW Group Therapy 06/11/2016 1:15 PM  Type of Therapy: Group Therapy- Emotion Regulation  Participation Level: Active   Participation Quality:  Appropriate  Affect: Appropriate  Cognitive: Alert and Oriented   Insight:  Developing/Improving  Engagement in Therapy: Developing/Improving and Engaged   Modes of Intervention: Clarification, Confrontation, Discussion, Education, Exploration, Limit-setting, Orientation, Problem-solving, Rapport Building, Art therapist, Socialization and Support  Summary of Progress/Problems: The topic for group today was emotional regulation. This group focused on both positive and negative emotion identification and allowed group members to process ways to identify feelings, regulate negative emotions, and find healthy ways to manage internal/external emotions. Group members were asked to reflect on a time when their reaction to an emotion led to a negative outcome and explored how alternative responses using emotion regulation would have benefited them. Group members were also asked to discuss a time when emotion regulation was utilized when a negative emotion was experienced. Pt discussed physical reasons for difficulty managing anger and stress due to brain damage from 4 previous strokes. Pt expressed that this puts a strain on his relationship with his wife. Pt processed coping strategies with peers.    Adriana Reams, LCSW 06/11/2016 3:52 PM
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
## 2016-06-12 LAB — TSH: TSH: 1.518 u[IU]/mL (ref 0.350–4.500)

## 2016-06-12 MED ORDER — VENLAFAXINE HCL ER 75 MG PO CP24
225.0000 mg | ORAL_CAPSULE | Freq: Every day | ORAL | Status: DC
  Administered 2016-06-13 – 2016-06-20 (×9): 225 mg via ORAL
  Filled 2016-06-12 (×11): qty 1

## 2016-06-12 MED ORDER — MIRTAZAPINE 7.5 MG PO TABS
7.5000 mg | ORAL_TABLET | Freq: Every day | ORAL | Status: DC
  Administered 2016-06-12 – 2016-06-19 (×8): 7.5 mg via ORAL
  Filled 2016-06-12 (×13): qty 1

## 2016-06-12 NOTE — Progress Notes (Signed)
Patient ID: [name], male   DOB: 1951-01-12, 66 y.o.   MRN: [id number] D: client visible on the unit, seen on phone and in dayroom watching TV. Client reports he has talked to wife and daughter today and plans to have a scheduled family conference prior to discharge. A: Writer provided emotional support encouraged client to verbalize any concerns. Medications reviewed, administered as ordered. Staff will monitor q10min for safety. R: client is safe on the unit, did not attend karaoke.

## 2016-06-12 NOTE — Progress Notes (Signed)
D: Pt at the time of assessment was alert and oriented x 4. Pt at the time endorsed severe anxiety moderate depression and L. knee pain; states, "I am very anxious now and that's why I'm trying to watch TV; my anxiety is definably more than my depression." Pt at the time denied SI, HI or AVH. Pt remained withdrawn to self even while in the dayroom. A: Medications offered as prescribed. All patient's questions and concerns addressed. Support, encouragement, and safe environment provided. Will continue to monitor for any changes. 15-minute safety checks continue. R: Pt was med compliant. Pt attended wrap-up group. Safety checks continue.
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
## 2016-06-12 NOTE — Tx Team (Signed)
Interdisciplinary Treatment and Diagnostic Plan Update 06/12/2016 Time of Session: 9:30am  [name]  MRN: [id number]  Principal Diagnosis: Major Depression, Recurrent , Severe  Secondary Diagnoses: Active Problems:   Alcohol-induced mood disorder (HCC)   Current Medications:  Current Facility-Administered Medications  Medication Dose Route Frequency Provider Last Rate Last Dose  . acetaminophen (TYLENOL) tablet 650 mg  650 mg Oral Q6H PRN Laverle Hobby, PA-C   650 mg at 06/12/16 1251  . alum & mag hydroxide-simeth (MAALOX/MYLANTA) 200-200-20 MG/5ML suspension 30 mL  30 mL Oral Q4H PRN Laverle Hobby, PA-C      . aspirin EC tablet 325 mg  325 mg Oral Daily Laverle Hobby, PA-C   325 mg at 06/12/16 0802  . chlordiazePOXIDE (LIBRIUM) capsule 25 mg  25 mg Oral Q6H PRN Laverle Hobby, PA-C      . clopidogrel (PLAVIX) tablet 75 mg  75 mg Oral Daily Laverle Hobby, PA-C   75 mg at 06/12/16 0802  . hydrOXYzine (ATARAX/VISTARIL) tablet 25 mg  25 mg Oral Q6H PRN Laverle Hobby, PA-C      . lamoTRIgine (LAMICTAL) tablet 100 mg  100 mg Oral BID Laverle Hobby, PA-C   100 mg at 06/12/16 0802  . loperamide (IMODIUM) capsule 2-4 mg  2-4 mg Oral PRN Laverle Hobby, PA-C      . magnesium hydroxide (MILK OF MAGNESIA) suspension 30 mL  30 mL Oral Daily PRN Laverle Hobby, PA-C      . multivitamin with minerals tablet 1 tablet  1 tablet Oral Daily Myer Peer Cobos, MD      . omega-3 acid ethyl esters (LOVAZA) capsule 1 g  1 g Oral Daily Laverle Hobby, PA-C      . ondansetron (ZOFRAN-ODT) disintegrating tablet 4 mg  4 mg Oral Q6H PRN Laverle Hobby, PA-C      . thiamine (VITAMIN B-1) tablet 100 mg  100 mg Oral Daily Laverle Hobby, PA-C      . traZODone (DESYREL) tablet 50 mg  50 mg Oral QHS PRN Jenne Campus, MD      . venlafaxine XR (EFFEXOR-XR) 24 hr capsule 150 mg  150 mg Oral Daily Laverle Hobby, PA-C   150 mg at 06/12/16 0802    PTA Medications: Prescriptions Prior to  Admission  Medication Sig Dispense Refill Last Dose  . acetaminophen (TYLENOL) 500 MG tablet Take 1,000 mg by mouth every 6 (six) hours as needed for mild pain or moderate pain. For pain   Past Week at Unknown time  . aspirin 325 MG EC tablet Take 325 mg by mouth daily.   06/06/2016 at Unknown time  . clopidogrel (PLAVIX) 75 MG tablet Take 75 mg by mouth daily.    06/06/2016 at Unknown time  . Cyanocobalamin (VITAMIN B 12 PO) Take 1 tablet by mouth daily.   Past Week at Unknown time  . gabapentin (NEURONTIN) 300 MG capsule Take 600 mg by mouth at bedtime.   Past Week at Unknown time  . lamoTRIgine (LAMICTAL) 100 MG tablet Take 100 mg by mouth 2 (two) times daily.    06/06/2016 at Unknown time  . Multiple Vitamins-Minerals (CENTRUM SILVER ULTRA MENS PO) Take 1 tablet by mouth daily.   06/06/2016 at Unknown time  . Omega-3 Fatty Acids (FISH OIL) 1200 MG CAPS Take 1 capsule by mouth daily.    06/06/2016 at Unknown time  . thiamine (VITAMIN B-1) 100 MG tablet Take  100 mg by mouth daily.   Past Week at Unknown time  . venlafaxine XR (EFFEXOR-XR) 75 MG 24 hr capsule Take 225 mg by mouth daily.    06/06/2016 at Unknown time    Treatment Modalities: Medication Management, Group therapy, Case management,  1 to 1 session with clinician, Psychoeducation, Recreational therapy.  Patient Stressors: Health problems Substance abuse Traumatic event Patient Strengths: Ability for insight Average or above average intelligence Metallurgist fund of knowledge Motivation for treatment/growth Supportive family/friends  Physician Treatment Plan for Primary Diagnosis: Major Depression, Recurrent , Severe Long Term Goal(s): Improvement in symptoms so as ready for discharge Short Term Goals: Ability to verbalize feelings will improve Ability to disclose and discuss suicidal ideas Ability to demonstrate self-control will improve Ability to identify and develop effective coping behaviors  will improve Ability to maintain clinical measurements within normal limits will improve Compliance with prescribed medications will improve Ability to identify triggers associated with substance abuse/mental health issues will improve  Medication Management: Evaluate patient's response, side effects, and tolerance of medication regimen.  Therapeutic Interventions: 1 to 1 sessions, Unit Group sessions and Medication administration.  Evaluation of Outcomes: Not Met  Physician Treatment Plan for Secondary Diagnosis: Active Problems:   Alcohol-induced mood disorder (HCC)  Long Term Goal(s): Improvement in symptoms so as ready for discharge  Short Term Goals: Ability to verbalize feelings will improve Ability to disclose and discuss suicidal ideas Ability to demonstrate self-control will improve Ability to identify and develop effective coping behaviors will improve Ability to maintain clinical measurements within normal limits will improve Compliance with prescribed medications will improve Ability to identify triggers associated with substance abuse/mental health issues will improve  Medication Management: Evaluate patient's response, side effects, and tolerance of medication regimen.  Therapeutic Interventions: 1 to 1 sessions, Unit Group sessions and Medication administration.  Evaluation of Outcomes: Not Met  RN Treatment Plan for Primary Diagnosis: Major Depression, Recurrent , Severe Long Term Goal(s): Knowledge of disease and therapeutic regimen to maintain health will improve  Short Term Goals: Ability to remain free from injury will improve and Compliance with prescribed medications will improve  Medication Management: RN will administer medications as ordered by provider, will assess and evaluate patient's response and provide education to patient for prescribed medication. RN will report any adverse and/or side effects to prescribing provider.  Therapeutic Interventions:  1 on 1 counseling sessions, Psychoeducation, Medication administration, Evaluate responses to treatment, Monitor vital signs and CBGs as ordered, Perform/monitor CIWA, COWS, AIMS and Fall Risk screenings as ordered, Perform wound care treatments as ordered.  Evaluation of Outcomes: Not Met  LCSW Treatment Plan for Primary Diagnosis: Major Depression, Recurrent , Severe Long Term Goal(s): Safe transition to appropriate next level of care at discharge, Engage patient in therapeutic group addressing interpersonal concerns. Short Term Goals: Engage patient in aftercare planning with referrals and resources, Increase ability to appropriately verbalize feelings, Identify triggers associated with mental health/substance abuse issues and Increase skills for wellness and recovery  Therapeutic Interventions: Assess for all discharge needs, 1 to 1 time with Social worker, Explore available resources and support systems, Assess for adequacy in community support network, Educate family and significant other(s) on suicide prevention, Complete Psychosocial Assessment, Interpersonal group therapy.  Evaluation of Outcomes: Not Met  Progress in Treatment: Attending groups: Pt is new to milieu, continuing to assess  Participating in groups: Pt is new to milieu, continuing to assess  Taking medication as prescribed: Yes, MD continues to assess  for medication changes as needed Toleration medication: Yes, no side effects reported at this time Family/Significant other contact made: No, CSW assessing for appropriate contact Patient understands diagnosis: Continuing to assess Discussing patient identified problems/goals with staff: Yes Medical problems stabilized or resolved: Yes Denies suicidal/homicidal ideation: No, pt recently admitted with suicidal ideation. Issues/concerns per patient self-inventory: None Other: N/A  New problem(s) identified: None identified at this time.   New Short Term/Long Term Goal(s):  None identified at this time.   Discharge Plan or Barriers:   Reason for Continuation of Hospitalization:  Anxiety  Depression Medication stabilization Suicidal ideation  Estimated Length of Stay: 3-5 days  Attendees: Patient: 06/12/2016 4:08 PM  Physician: Dr. Parke Poisson 06/12/2016 4:08 PM  Nursing:  06/12/2016 4:08 PM  RN Care Manager: Lars Pinks, RN 06/12/2016 4:08 PM  Social Worker: Matthew Saras, [place] 06/12/2016 4:08 PM  Recreational Therapist:  06/12/2016 4:08 PM  Other: Lindell Spar, NP; Samuel Jester, NP 06/12/2016 4:08 PM  Other:  06/12/2016 4:08 PM  Other: 06/12/2016 4:08 PM   Scribe for Treatment Team: Georga Kaufmann, MSW,LCSWA 06/12/2016 4:08 PM

## 2016-06-12 NOTE — [facility] Group Notes (Signed)
[facility] Group Notes:  (Nursing/MHT/Case Management/Adjunct)  Date:  06/12/2016  Time:  0900  Type of Therapy:  Nurse Education - Leisure and Lifestyle Changes  Participation Level:  Active  Participation Quality:  Attentive  Affect:  Blunted  Cognitive:  Alert  Insight:  Limited  Engagement in Group:  Limited  Modes of Intervention:  Education  Summary of Progress/Problems: Patient attended nursing ed group.  Loletta Specter [facility] 06/12/2016, [phone]

## 2016-06-12 NOTE — Progress Notes (Signed)
Psychoeducational Group Note  Date:  06/12/2016 Time:  2241  Group Topic/Focus:  Wrap-Up Group:   The focus of this group is to help patients review their daily goal of treatment and discuss progress on daily workbooks.  Participation Level: Did Not Attend  Participation Quality:  Not Applicable  Affect:  Not Applicable  Cognitive:  Not Applicable  Insight:  Not Applicable  Engagement in Group: Not Applicable  Additional Comments:  The patient did not attend since he remained in his bedroom.   Archie Balboa S 06/12/2016, 10:40 PM

## 2016-06-12 NOTE — Progress Notes (Signed)
D: Pt A & O X4. Denies SI, HI, AVH and pain. Presents tearful, sad and depressed on initial contact at medication window. Per pt "my with and my daughter promised to call me back last evening and did not; I feel like I'm forgotten already". Cooperative with unit routines including groups as scheduled. Fall precaution remains effective without event.  Pt observed in milieu, engaged with staff and peers. Denies withdrawal symptoms at this time. A: Scheduled and PRN medications administered as prescribed, effects monitored. Support and availability provided to lt. Encouraged pt to voice concerns, attend scheduled unit groups / activities. Copy of pt's DNR records obtained from his PCP's office via fax per pt's permission. Q 15 minutes safety checks continues without incidents. R: Pt did not attend unit groups as scheduled despite encouragement. Compliant with medications when offered. Denies adverse drug reactions. Tolerated all PO intake well. POC remains effective for safety and mood stability.

## 2016-06-12 NOTE — [facility] Group Notes (Signed)
[facility] LCSW Group Therapy 06/12/2016 1:15pm  Type of Therapy: Group Therapy- [facility] in Life  Pt did not attend, declined invitation.   Adriana Reams, LCSW 06/12/2016 3:18 PM
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
## 2016-06-13 NOTE — Progress Notes (Addendum)
[name] is seen OOB UAL on the adult unit today he tolerates this fairly well. He is gruffy and irritable, upon approach by Probation officer, this morning and he apologizes after speaking ugly and says " I'm sorry.Marland Kitchenit's not  you. I don't wake up easy". A He completes his daily assessment shortly thereafter and on this he  Writes  He has experienced SI today and says " I think it a lot....but I don't have any plans to act on it".... When writer presses him on this, he contracts with Probation officer to stay safe today and then he rates his depresssion, hopelessness and anxeity " 7/7/9", respectively.He requested and was given a prn Librium l 25 mg po for c/o agitation / worry / anxiety and then R Safety is in  place and emotional support offered by w riter.

## 2016-06-13 NOTE — Progress Notes (Signed)
Patient attended group and said that his day was a 3. Something exciting that happened today was he was able to see his physician and set up a family conference.

## 2016-06-13 NOTE — BHH Group Notes (Signed)
[facility] LCSW Aftercare Discharge Planning Group Note   06/13/2016  8:45 AM  Participation Quality:  Pt invited. Did not attend.  Georga Kaufmann, MSW, LCSWA 06/13/2016 3:26 PM

## 2016-06-13 NOTE — Progress Notes (Signed)
Adult Psychoeducational Group Note  Date:  06/13/2016 Time:  10:36 AM  Group Topic/Focus:  Goals Group:   The focus of this group is to help patients establish daily goals to achieve during treatment and discuss how the patient can incorporate goal setting into their daily lives to aide in recovery.  Participation Level:  Active  Participation Quality:  Appropriate  Affect:  Appropriate  Cognitive:  Alert  Insight: Good  Engagement in Group:  Engaged  Modes of Intervention:  Discussion  Additional Comments:  Pt did participate in group today.  Pt states that he has been suffering with a bad headache today, he also talked about being a stroke survivor and also sufferers with anxiety.  Pt states that his wife has lung cancer as well so he will be taking care of her when he leaves the hospital.  Pt states that he will be following up with his doctor and neurologist when he leaves.  Malikah Lakey R Shakiyah Cirilo 06/13/2016, 10:36 AM

## 2016-06-13 NOTE — BHH Group Notes (Signed)
[facility] LCSW Group Therapy 06/13/2016 1:15pm  Type of Therapy: Group Therapy- Feelings Around Relapse and Recovery  Participation Level: Pt invited. Did not attend.    Georga Kaufmann, MSW, Latanya Presser [phone] 06/13/2016 3:26 PM

## 2016-06-14 DIAGNOSIS — F329 Major depressive disorder, single episode, unspecified: Secondary | ICD-10-CM

## 2016-06-14 DIAGNOSIS — Z87891 Personal history of nicotine dependence: Secondary | ICD-10-CM

## 2016-06-14 DIAGNOSIS — F39 Unspecified mood [affective] disorder: Secondary | ICD-10-CM

## 2016-06-14 DIAGNOSIS — G4700 Insomnia, unspecified: Secondary | ICD-10-CM

## 2016-06-14 MED ORDER — HYDROXYZINE HCL 25 MG PO TABS
25.0000 mg | ORAL_TABLET | Freq: Three times a day (TID) | ORAL | Status: DC | PRN
  Administered 2016-06-14 – 2016-06-20 (×14): 25 mg via ORAL
  Filled 2016-06-14 (×14): qty 1
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
## 2016-06-14 NOTE — Progress Notes (Signed)
D [name] remains anxious and worried about his upcoming family session on MOnday. A He completed his daily assessment and on this he wrote he has experienced SI today but he rates his depression, hopelessness and anxeity " 9/8/9", respectively. He shares that he " doesn't know what I'll do if they won't come to the meeting" and writer tries to process his choices of how to spend his energy (worrying about something he cannot control vs something he can...) but he is unwilling to "go there". R Safety in place. Staff to cont to offer support and encouragement.

## 2016-06-14 NOTE — BHH Group Notes (Signed)
[facility] Group Notes:  (Nursing/MHT/Case Management/Adjunct)  Date:  06/14/2016  Time:  0900 am  Type of Therapy:  Nurse Education  Participation Level:  Active  Participation Quality:  Appropriate and Attentive  Affect:  Appropriate  Cognitive:  Alert and Appropriate  Insight:  Good  Engagement in Group:  Engaged  Modes of Intervention:  Support  Summary of Progress/Problems: Patient listened attentively and asked appropriate questions.  He states he is experiencing a lot of stress.  He is having nightmares.    Zipporah Plants 06/14/2016, 9:57 AM

## 2016-06-14 NOTE — BHH Group Notes (Signed)
Adult Therapy Group Note  Date:  06/14/2016  Time:  10:00-11:00AM  Group Topic/Focus: Unhealthy vs Healthy Coping Techniques  Building Self Esteem:    The focus of this group was to determine what unhealthy coping techniques typically are used or and what healthy coping techniques would be helpful in coping with various problems. Patients were guided in becoming aware of the differences between healthy and unhealthy coping techniques.  Vignettes were used to generate ideas, which led to a discussion about personal application.     Participation Level:  Active  Participation Quality:  Attentive, Sharing and Supportive  Affect:  Appropriate  Cognitive:  Appropriate  Insight: Good  Engagement in Group:  Engaged  Modes of Intervention:  Exercise, Discussion and Support  Additional Comments:  The patient expressed that he uses meditation and music as healthy coping techniques, and shared that he is an alcoholic and has used coping techniques for that problem as well.  He contributed enthusiastically and appropriately throughout the session.  Selmer Dominion, LCSW 06/14/2016   12:37 PM
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
## 2016-06-15 NOTE — BHH Group Notes (Signed)
Healthy Support Systems  Date:  06/15/2016  Time:  1300  Type of Therapy:  Nurse Education   /  Healthy Support Systems : The group focuses on teaching patients how to identify unhealthy behaviors and then develop and utilize [employer]. Participation Level:  Active  Participation Quality:  Attentive  Affect:  Anxious  Cognitive:  Alert  Insight:  Good  Engagement in Group:  Engaged  Modes of Intervention:  Education  Summary of Progress/Problems:  Lauralyn Primes 06/15/2016, 4:30 PM
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
## 2016-06-15 NOTE — Progress Notes (Signed)
D ab is seen OOB UAL on the 400 hall today tolerated fair. HE remains very anxious about his impending family meeting, planned for tomorrow. A He completed his daily assessment and onit he wrote he has experieinced SI today but he contracts willingly with this Probation officer, to ot hurt self today. HE rates his depression, hopelessness and anxeity " 2/1/2", respectively. R Safety in place

## 2016-06-15 NOTE — BHH Group Notes (Signed)
Life Skills   Date:  06/14/2016  Time:  1300  Type of Therapy:  Nurse Education  /  Identiying Needs : The group is focused on teaching patients how to identify their needs and then how to develop the skills needed to get them met.  Participation Level:  Active  Participation Quality:  Appropriate  Affect:  Appropriate  Cognitive:  Appropriate  Insight:  Appropriate  Engagement in Group:  Engaged  Modes of Intervention:    Summary of Progress/Problems:  Lauralyn Primes 06/15/2016, 11:50 AM

## 2016-06-15 NOTE — Progress Notes (Signed)
Writer spoke with patient 1:1 and he reports having had a better day today. He attended group and participated. He spoke with Probation officer at length about his upcoming family session and how his children and step children have responded to him asking them to come. Patient is very concerned and worried about his children and wife. Writer encouraged him to focus on himself so that he will improve and feel even better. He currently denies si/hi/a/visual hallucinations  maintained on unit with 15 min  checks.
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
## 2016-06-16 NOTE — B[facility] Group Notes (Signed)
[facility] LCSW Aftercare Discharge Planning Group Note   06/16/2016  8:45 AM  Participation Quality: Pt invited. Did not attend.  Georga Kaufmann, MSW, LCSWA 06/16/2016 4:58 PM

## 2016-06-16 NOTE — Progress Notes (Signed)
Child/Adolescent Psychoeducational Group Note  Date:  06/16/2016 Time:  11:26 AM  Group Topic/Focus:  Wellness Toolbox:   The focus of this group is to discuss various aspects of wellness, balancing those aspects and exploring ways to increase the ability to experience wellness.  Patients will create a wellness toolbox for use upon discharge.  Participation Level:  Active  Participation Quality:  Attentive  Affect:  Appropriate  Cognitive:  Alert  Insight:  Good  Engagement in Group:  Improving  Modes of Intervention:  Discussion  Additional Comments:  Patient participated in group this morning.  [name] R Eleuterio Dollar 06/16/2016, 11:26 AM

## 2016-06-16 NOTE — BHH Suicide Risk Assessment (Signed)
[place] INPATIENT:  Family/Significant Other Suicide Prevention Education  Suicide Prevention Education:  Education Completed; Taiden Raybourn, Pt's wife [phone], has been identified by the patient as the family member/significant other with whom the patient will be residing, and identified as the person(s) who will aid the patient in the event of a mental health crisis (suicidal ideations/suicide attempt).  With written consent from the patient, the family member/significant other has been provided the following suicide prevention education, prior to the and/or following the discharge of the patient.  The suicide prevention education provided includes the following:  Suicide risk factors  Suicide prevention and interventions  National Suicide Hotline telephone number  [facility] assessment telephone number  [facility] Emergency Assistance [place] and/or Residential Mobile Crisis Unit telephone number  Request made of family/significant other to:  Remove weapons (e.g., guns, rifles, knives), all items previously/currently identified as safety concern.    Remove drugs/medications (over-the-counter, prescriptions, illicit drugs), all items previously/currently identified as a safety concern.  The family member/significant other verbalizes understanding of the suicide prevention education information provided.  The family member/significant other agrees to remove the items of safety concern listed above.  [place]ad[place] Lighter 06/16/2016, 2:46 PM
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
## 2016-06-16 NOTE — Progress Notes (Signed)
[name] had been up and visible in milieu this evening, did attend and participate in evening group activity. He was seen interacting appropriately with peers and spoke about his day and some on-going anxiety issues. [name] did receive bedtime medication without incident this evening. A. Support and encouragement provided. R. Safety maintained, will continue to monitor.

## 2016-06-16 NOTE — Progress Notes (Signed)
Recreation Therapy Notes  Date: 06/16/16 Time: 0930 Location: [address] Dayroom  Group Topic: Stress Management  Goal Area(s) Addresses:  Patient will verbalize importance of using healthy stress management.  Patient will identify positive emotions associated with healthy stress management.   Intervention: Stress Management  Activity :  Progressive Muscle Relaxation.  LRT read a script to guide patients through the stress management technique of progressive muscle relaxation.  Patients were to follow along as the script was read to engage in the technique.  Education:  Stress Management, Discharge Planning.   Education Outcome: Acknowledges edcuation/In group clarification offered/Needs additional education  Clinical Observations/Feedback:  Pt did not attend group.   Victorino Sparrow, LRT/CTRS         Victorino Sparrow A 06/16/2016 12:28 PM

## 2016-06-16 NOTE — Progress Notes (Signed)
CSW had a family session with pt, MD, [name]'s 3 children, and [name]'s wife. [name]'s family voiced their concerns about [name] remaining well once he leaves the hospital. [name] voiced his desire to rebuild his relationship with his children. [name]'s children explained that the relationship is strained for several reasons, but they are open to rebuilding one as well. [name]'s children talked at length about the concerns they had about their father's parenting style. [name]'s wife became upset and tried to defend the [name]. By the end of the session the family had agreed that they will likely never see eye to eye on most things, however, they are willing to move forward and be as supportive as their father as possible.   Georga Kaufmann, MSW, [place]

## 2016-06-16 NOTE — Progress Notes (Signed)
Patient ID: [name], male   DOB: March 06, 1950, 66 y.o.   MRN: [id number] PER STATE REGULATIONS 482.30  THIS CHART WAS REVIEWED FOR MEDICAL NECESSITY WITH RESPECT TO THE PATIENT'S ADMISSION/ DURATION OF STAY.  NEXT REVIEW DATE: 06/18/2016 Chauncy Lean, RN, BSN CASE MANAGER

## 2016-06-16 NOTE — Progress Notes (Signed)
Patient ID: [name], male   DOB: Mar 16, 1950, 66 y.o.   MRN: [id number]  DAR: Pt. Denies SI/HI and A/V Hallucinations to writer during assessment this morning. However, patient does report on his daily inventory sheet that he does continue to have SI thoughts sometimes, MD Cobos notified. Patient reports high anxiety and relates it to his upcoming family session he is having with his wife. He rates anxiety 9-10/10 and is offered Vistaril however reports he wants to wait until closer to his family session. Patient does report a headache this morning and "central pain due to my stroke." He is given an ice pack and Tylenol. He presents with depressed and anxious affect and mood. Support and encouragement provided to the patient. Scheduled medications administered to patient per physician's orders. Patient is seen in the milieu interacting with peers and continues his plan of care. Q15 minute checks are maintained for safety.

## 2016-06-16 NOTE — Progress Notes (Signed)
Patient attended group and participated

## 2016-06-16 NOTE — [facility] Group Notes (Signed)
[facility] LCSW Group Therapy  06/16/2016 1:15pm  Type of Therapy:  Group Therapy vercoming Obstacles  Pt did not attend as he was participating in a family session with MD.  Adriana Reams, LCSW 06/16/2016 2:42 PM

## 2016-06-17 NOTE — Progress Notes (Signed)
Patient attended the evening group session and answered all discussion prompts from this Probation officer. Patient stated his goal for the day was to be more optimistic. Patient rated his day a 6 out of 10 and his affect was appropriate.

## 2016-06-17 NOTE — Progress Notes (Signed)
Patient states he feels dizzy.  One of his peers came to the nurse's station and stated that the patient almost "fell over."  Patient had recently been on the phone and appears distressed.  His vitals were taken and they were recorded.  Patient was advised to rise slowly and advise staff of his symptoms if they continue.

## 2016-06-17 NOTE — Progress Notes (Signed)
Patient ID: [name], male   DOB: 12-Aug-1950, 66 y.o.   MRN: [id number]  DAR: Pt. Denies SI/HI and A/V Hallucinations. Patient does report a headache and "central" pain. He reports sleep is poor, appetite is poor-fair, energy level is low, and concentration is poor. He rates depression 9/10, hopelessness 8/10, and anxiety 8/10. He reports he does have some difficulty with lightheadedness and dizziness however vitals were obtained by Charge RN Chrys Racer and Probation officer followed up with patient and he stated he was feeling better. Support and encouragement provided to the patient. Scheduled and PRN medications administered to patient per physician's orders. Patient is seen in the milieu intermittently. He appears disheveled today and has been wearing pajamas throughout the day. He is depressed and anxious in mood and in affect. He reports that he is thinking about his family meeting that he attended yesterday. Q15 minute checks are maintained for safety.

## 2016-06-17 NOTE — [facility] Group Notes (Signed)
[facility] Group Notes:  (Nursing/MHT/Case Management/Adjunct)  Date:  06/17/2016  Time:  1:50 PM  Type of Therapy:  Psychoeducational Skills  Participation Level:  None  Participation Quality:  Inattentive  Affect:  Depressed  Cognitive:  Alert  Insight:  Lacking  Engagement in Group:  Poor  Modes of Intervention:  Discussion and Education  Summary of Progress/Problems: Patient came to group late and got a coffee. He sat down and appeared withdrawal. Writer offered for patient to speak in group but he declined.   Gianny Sabino E 06/17/2016, 1:50 PM

## 2016-06-17 NOTE — Tx Team (Signed)
Interdisciplinary Treatment and Diagnostic Plan Update 06/17/2016 Time of Session: 9:30am  [name]  MRN: [id number]  Principal Diagnosis: Major Depression, Recurrent , Severe  Secondary Diagnoses: Active Problems:   Alcohol-induced mood disorder (HCC)   Current Medications:  Current Facility-Administered Medications  Medication Dose Route Frequency Provider Last Rate Last Dose  . acetaminophen (TYLENOL) tablet 650 mg  650 mg Oral Q6H PRN Jose Hobby, PA-C   650 mg at 06/17/16 4827  . alum & mag hydroxide-simeth (MAALOX/MYLANTA) 200-200-20 MG/5ML suspension 30 mL  30 mL Oral Q4H PRN Jose Hobby, PA-C      . aspirin EC tablet 325 mg  325 mg Oral Daily Jose Hobby, PA-C   325 mg at 06/17/16 0809  . clopidogrel (PLAVIX) tablet 75 mg  75 mg Oral Daily Jose Hobby, PA-C   75 mg at 06/17/16 0786  . hydrOXYzine (ATARAX/VISTARIL) tablet 25 mg  25 mg Oral TID PRN Jose Nunnery, NP   25 mg at 06/17/16 0810  . lamoTRIgine (LAMICTAL) tablet 100 mg  100 mg Oral BID Jose Hobby, PA-C   100 mg at 06/17/16 7544  . magnesium hydroxide (MILK OF MAGNESIA) suspension 30 mL  30 mL Oral Daily PRN Jose Hobby, PA-C      . mirtazapine (REMERON) tablet 7.5 mg  7.5 mg Oral QHS Jose Peer Cobos, MD   7.5 mg at 06/16/16 2121  . multivitamin with minerals tablet 1 tablet  1 tablet Oral Daily Jose Campus, MD   1 tablet at 06/14/16 0836  . omega-3 acid ethyl esters (LOVAZA) capsule 1 g  1 g Oral Daily Jose Hobby, PA-C   1 g at 06/13/16 9201  . thiamine (VITAMIN B-1) tablet 100 mg  100 mg Oral Daily Jose Hobby, PA-C   100 mg at 06/14/16 0071  . venlafaxine XR (EFFEXOR-XR) 24 hr capsule 225 mg  225 mg Oral Daily Jose Campus, MD   225 mg at 06/17/16 0808    PTA Medications: Prescriptions Prior to Admission  Medication Sig Dispense Refill Last Dose  . acetaminophen (TYLENOL) 500 MG tablet Take 1,000 mg by mouth every 6 (six) hours as needed for mild pain or moderate  pain. For pain   Past Week at Unknown time  . aspirin 325 MG EC tablet Take 325 mg by mouth daily.   06/06/2016 at Unknown time  . clopidogrel (PLAVIX) 75 MG tablet Take 75 mg by mouth daily.    06/06/2016 at Unknown time  . Cyanocobalamin (VITAMIN B 12 PO) Take 1 tablet by mouth daily.   Past Week at Unknown time  . gabapentin (NEURONTIN) 300 MG capsule Take 600 mg by mouth at bedtime.   Past Week at Unknown time  . lamoTRIgine (LAMICTAL) 100 MG tablet Take 100 mg by mouth 2 (two) times daily.    06/06/2016 at Unknown time  . Multiple Vitamins-Minerals (CENTRUM SILVER ULTRA MENS PO) Take 1 tablet by mouth daily.   06/06/2016 at Unknown time  . Omega-3 Fatty Acids (FISH OIL) 1200 MG CAPS Take 1 capsule by mouth daily.    06/06/2016 at Unknown time  . thiamine (VITAMIN B-1) 100 MG tablet Take 100 mg by mouth daily.   Past Week at Unknown time  . venlafaxine XR (EFFEXOR-XR) 75 MG 24 hr capsule Take 225 mg by mouth daily.    06/06/2016 at Unknown time    Treatment Modalities: Medication Management, Group therapy, Case management,  1 to  1 session with clinician, Psychoeducation, Recreational therapy.  Patient Stressors: Health problems Substance abuse Traumatic event Patient Strengths: Ability for insight Average or above average intelligence Occupational psychologist fund of knowledge Motivation for treatment/growth Supportive family/friends  Physician Treatment Plan for Primary Diagnosis: Major Depression, Recurrent , Severe Long Term Goal(s): Improvement in symptoms so as ready for discharge Short Term Goals: Ability to verbalize feelings will improve Ability to disclose and discuss suicidal ideas Ability to demonstrate self-control will improve Ability to identify and develop effective coping behaviors will improve Ability to maintain clinical measurements within normal limits will improve Compliance with prescribed medications will improve Ability to identify triggers  associated with substance abuse/mental health issues will improve  Medication Management: Evaluate patient's response, side effects, and tolerance of medication regimen.  Therapeutic Interventions: 1 to 1 sessions, Unit Group sessions and Medication administration.  Evaluation of Outcomes: Progressing  Physician Treatment Plan for Secondary Diagnosis: Active Problems:   Alcohol-induced mood disorder ([place])  Long Term Goal(s): Improvement in symptoms so as ready for discharge  Short Term Goals: Ability to verbalize feelings will improve Ability to disclose and discuss suicidal ideas Ability to demonstrate self-control will improve Ability to identify and develop effective coping behaviors will improve Ability to maintain clinical measurements within normal limits will improve Compliance with prescribed medications will improve Ability to identify triggers associated with substance abuse/mental health issues will improve  Medication Management: Evaluate patient's response, side effects, and tolerance of medication regimen.  Therapeutic Interventions: 1 to 1 sessions, Unit Group sessions and Medication administration.  Evaluation of Outcomes: Progressing  RN Treatment Plan for Primary Diagnosis: Major Depression, Recurrent , Severe Long Term Goal(s): Knowledge of disease and therapeutic regimen to maintain health will improve  Short Term Goals: Ability to remain free from injury will improve and Compliance with prescribed medications will improve  Medication Management: RN will administer medications as ordered by provider, will assess and evaluate patient's response and provide education to patient for prescribed medication. RN will report any adverse and/or side effects to prescribing provider.  Therapeutic Interventions: 1 on 1 counseling sessions, Psychoeducation, Medication administration, Evaluate responses to treatment, Monitor vital signs and CBGs as ordered, Perform/monitor  CIWA, COWS, AIMS and Fall Risk screenings as ordered, Perform wound care treatments as ordered.  Evaluation of Outcomes: Progressing  LCSW Treatment Plan for Primary Diagnosis: Major Depression, Recurrent , Severe Long Term Goal(s): Safe transition to appropriate next level of care at discharge, Engage patient in therapeutic group addressing interpersonal concerns. Short Term Goals: Engage patient in aftercare planning with referrals and resources, Increase ability to appropriately verbalize feelings, Identify triggers associated with mental health/substance abuse issues and Increase skills for wellness and recovery  Therapeutic Interventions: Assess for all discharge needs, 1 to 1 time with Social worker, Explore available resources and support systems, Assess for adequacy in community support network, Educate family and significant other(s) on suicide prevention, Complete Psychosocial Assessment, Interpersonal group therapy.  Evaluation of Outcomes: Progressing  Progress in Treatment: Attending groups: Intermittently   Participating in groups: Yes, when he attends   Taking medication as prescribed: Yes, MD continues to assess for medication changes as needed Toleration medication: Yes, no side effects reported at this time Family/Significant other contact made: Yes, pt's wife contacted. Patient understands diagnosis: Developing insight  Discussing patient identified problems/goals with staff: Yes Medical problems stabilized or resolved: Yes Denies suicidal/homicidal ideation: No, pt endorses passive SI Issues/concerns per patient self-inventory: None Other: N/A  New problem(s) identified: None  identified at this time.   New Short Term/Long Term Goal(s): None identified at this time.   Discharge Plan or Barriers: Pt will return home and follow up with an outpatient provider. Pt may also be a good fit for IOP.  Reason for Continuation of Hospitalization:  Anxiety   Depression Medication stabilization Suicidal ideation  Estimated Length of Stay: 1-3 days  Attendees: Patient: 06/17/2016 2:19 PM  Physician: Dr. Parke Poisson 06/17/2016 2:19 PM  Nursing: Trinna Post RN; [place], RN 06/17/2016 2:19 PM  RN Care Manager: Lars Pinks, RN 06/17/2016 2:19 PM  Social Worker: Matthew Saras, [place] 06/17/2016 2:19 PM  Recreational Therapist:  06/17/2016 2:19 PM  Other: Lindell Spar, NP; Samuel Jester, NP 06/17/2016 2:19 PM  Other:  06/17/2016 2:19 PM  Other: 06/17/2016 2:19 PM   Scribe for Treatment Team: Georga Kaufmann, MSW,LCSWA 06/17/2016 2:19 PM

## 2016-06-17 NOTE — Progress Notes (Signed)
Recreation Therapy Notes  Animal-Assisted Activity (AAA) Program Checklist/Progress Notes Patient Eligibility Criteria Checklist & Daily Group note for Rec TxIntervention  Date: 05.01.2018 Time: 2:45pm Location: [address]   AAA/T Program Assumption of Risk Form signed by Patient/ or Parent Legal Guardian Yes  Patient is free of allergies or sever asthma Yes  Patient reports no fear of animals Yes  Patient reports no history of cruelty to animals Yes  Patient understands his/her participation is voluntary Yes  Patient washes hands before animal contact Yes  Patient washes hands after animal contact Yes  Behavioral Response: Appropriate   Education:Hand Washing, Appropriate Animal Interaction   Education Outcome: Acknowledges education.   Clinical Observations/Feedback: Patient attended session and interacted appropriately with therapy dog and peers. Patient asked appropriate questions about therapy dog and his training. Patient shared stories about their pets at home with group.   Laureen Ochs Jawaun Celmer, LRT/CTRS        Myrth Dahan L 06/17/2016 2:59 PM

## 2016-06-17 NOTE — Progress Notes (Signed)
Pt has been in the dayroom all evening watching TV with some interaction with his peers.  He did spend some time talking on the phone with his wife.  He reports his day has been full of anxiety.  He denies having any withdrawal symptoms at this time.  He feels he is finished with that part of his recovery.  His issue now is his estrangement with his children.  He said the family session they had yesterday was insightful.  He is glad that all of his children showed up, although they could not come to agreement on how things happened in the past, he said his children are supportive that he is trying to get help.  He has been pleasant and cooperative this evening.  He can be arrogant and sarcastic at times, but in a "pleasant" way.  He makes his needs known to staff.  He denies SI/HI/AVH.  Support and encouragement offered.  Discharge plans are in process.  Possibly will be referred for ECT treatments.  Safety maintained with q15 minute checks.

## 2016-06-17 NOTE — Progress Notes (Signed)
[name] was in his room for a good portion of the evening, did come out and interact some with staff and peers and spoke briefly about his family session and how it did not go well but quickly changed the focus and began making casual conversation. He did receive bedtime medications without incident and did request and receive tylenol to help with headache. A. Support and encouragement provided. R. Safety maintained, will continue to monitor.
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
## 2016-06-18 NOTE — [facility] Group Notes (Addendum)
[facility] [employer] Group Therapy 06/17/2016 1:15pm  Type of Therapy: Mental Health Association Presentation  Participation Level: Pt invited. Did not attend. Stated he had a "splitting headache".   Kara Mead. Marshell Levan, MSW, [facility] 06/18/2016 10:42 AM

## 2016-06-18 NOTE — B[facility] Group Notes (Signed)
[place] LCSW Group Therapy 06/18/2016 1:15 PM  Type of Therapy: Group Therapy- Emotion Regulation  Participation Level: Pt invited. Did not attend.   Georga Kaufmann, MSW, LCSWA 06/18/2016 3:51 PM

## 2016-06-18 NOTE — Progress Notes (Signed)
Recreation Therapy Notes  Date: 06/18/16 Time: 0930 Location: 400 Hall Dayroom  Group Topic: Stress Management  Goal Area(s) Addresses:  Patient will verbalize importance of using healthy stress management.  Patient will identify positive emotions associated with healthy stress management.   Intervention: Stress Management  Activity :  Guided Imagery.  LRT introduced the stress management technique of guided imagery.  LRT read a script to allow patients engage in the technique.  Patients were to follow along as the script was read by LRT to participate in the technique of guided imagery.  Education:  Stress Management, Discharge Planning.   Education Outcome: Acknowledges edcuation/In group clarification offered/Needs additional education  Clinical Observations/Feedback: Pt did not attend group.    Victorino Sparrow, LRT/CTRS         Victorino Sparrow A 06/18/2016 11:23 AM
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
## 2016-06-18 NOTE — Progress Notes (Signed)
PER STATE REGULATIONS 482.30  THIS CHART WAS REVIEWED FOR MEDICAL NECESSITY WITH RESPECT TO THE PATIENT'S ADMISSION/ DURATION OF STAY.  NEXT REVIEW DATE: 06/22/2016  Chauncy Lean, RN, BSN CASE MANAGER

## 2016-06-18 NOTE — Progress Notes (Signed)
Pt has been in the dayroom most of the evening talking with peers and watching TV.  He seems more relaxed this evening, although he says he is still having anxious thoughts about his family situation and the possibility of the ECT treatments.  He said the doctor told him that if they had not heard from [facility] tomorrow, the doctor would call and talk to the doctor there 1:1.  He denies SI/HI/AVH.  He makes his needs known to staff.  Pt is polite and cooperative.  Pt voices no other needs or concerns at this time.  Support and encouragement offered.  Discharge plans are in process.  Safety maintained with q15 minute checks.

## 2016-06-18 NOTE — [facility] Group Notes (Signed)
[facility] LCSW Aftercare Discharge Planning Group Note   06/18/2016  8:45 AM  Participation Quality:  Pt invited. Did not attend.  Georga Kaufmann, MSW, LCSWA 06/18/2016 11:03 AM

## 2016-06-18 NOTE — Progress Notes (Signed)
Patient ID: [name], male   DOB: [birth year], 66 y.o.   MRN: [id number] D-Self inventory completed and rates his depression as a 6-7, hopelessness as a 4-5, and anxiety as a 7-8.  Affect brighter than reported yesterday, verbal and appropriate. States his goal is to be more positive today. States yesterday he processed his family meeting and today he is feeling more positive about things and his future which is the reason he is somewhat more animated.  A-Support offered, monitored for safety and medications as ordered.  R-Pain from post stroke syndrome. He is waiting to here more about his appropriateness for ECT tx. He is attending groups as they are available. Positive peer interactions noted. He is able to contract for safety at this time.

## 2016-06-19 NOTE — Progress Notes (Signed)
Nursing Progress Note 1900-0700  D) Patient presents pleasant and anxious. Patient reports "I am going to [facility] tomorrow for ECT. I am nervous". Patient reports "I've tried every medication and nothing works". Patient denies SI/HI/AVH/pain but endorses depression. Patient contracts for safety on the unit.   A) Emotional support given. 1:1 interaction and active listening provided. Patient medicated with PM orders as prescribed. Medications reviewed with patient. Patient verbalized understanding of medications without further questions.  Snacks and fluids provided. Opportunities for questions or concerns presented to patient. Patient encouraged to continue to work on treatment goals. Labs, vital signs and patient behavior monitored throughout shift. Patient safety maintained with q15 min safety checks.   R) Patient receptive to interaction with nurse. Patient remains safe on the unit at this time. Patient denies any adverse medication reactions at this time. Patient is resting in bed without complaints. Will continue to monitor.
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
## 2016-06-19 NOTE — Consult Note (Signed)
ECT: Received referral for possible ECT treatment. Chart reviewed. Spoke with Dr. Parke Poisson. Patient would potentially be a candidate for ECT treatment. I would be willing to evaluate him in the hospital with a tentative plan to begin ECT on Monday, May 7 at the earliest. Patient will need to be agreeable to treatment plan and be transferred to our hospital by tomorrow. Situation reviewed with TTS.

## 2016-06-19 NOTE — Progress Notes (Signed)
Adult Psychoeducational Group Note  Date:  06/19/2016 Time:  4:04 AM  Group Topic/Focus:  Wrap-Up Group:   The focus of this group is to help patients review their daily goal of treatment and discuss progress on daily workbooks.  Participation Level:  Active  Participation Quality:  Appropriate, Attentive and Sharing  Affect:  Appropriate  Cognitive:  Alert and Appropriate  Insight: Appropriate and Improving  Engagement in Group:  Engaged  Modes of Intervention:  Discussion  Additional Comments:  Patient stated his goal for today was to find better ways to cope with family issues. Patient stated he achieved his goal for today. Patient rated his day a 7 out of 10.  Candy Sledge 06/19/2016, 4:04 AM

## 2016-06-19 NOTE — BHH Group Notes (Signed)
The focus of this group is to educate the patient on the purpose and policies of crisis stabilization and provide a format to answer questions about their admission.  The group details unit policies and expectations of patients while admitted.  Patient did not attend 0900 nurse education orientation group this morning.  Patient stayed in bed.   

## 2016-06-19 NOTE — Plan of Care (Signed)
Problem: Education: Goal: Ability to state activities that reduce stress will improve Outcome: Progressing Nurse discussed depression/anxiety/coping skills with patient.

## 2016-06-19 NOTE — BHH Group Notes (Signed)
[place] LCSW Group Therapy 06/19/2016 1:15 PM  Type of Therapy: Group Therapy- Feelings about Diagnosis  Participation Level: Active   Participation Quality: Appropriate  Affect: Appropriate  Cognitive: Alert and Oriented   Insight: Developing   Engagement in Therapy: Developing/Improving and Engaged  Modes of Intervention: Clarification, Confrontation, Discussion, Education, Exploration, Limit-setting, Orientation, Problem-solving, Rapport Building, Art therapist, Socialization and Support  Description of Group:  This group will allow patients to explore their thoughts and feelings about diagnoses they have received. Patients will be guided to explore their level of understanding and acceptance of these diagnoses. Facilitator will encourage patients to process their thoughts and feelings about the reactions of others to their diagnosis, and will guide patients in identifying ways to discuss their diagnosis with significant others in their lives. This group will be process-oriented, with patients participating in exploration of their own experiences as well as giving and receiving support and challenge from other group members.  Summary of Progress/Problems:  Pt states that he is looking forward to receiving ECT treatment. Pt is hopeful that this treatment will be helpful in relieving some of his symptoms. Pt talked briefly about his suicide attempt and expressed disappointment in the fact that he did not complete suicide.   Therapeutic Modalities:  Cognitive Behavioral Therapy Solution Focused Therapy Motivational Interviewing Relapse Prevention [place], MSW, [place]

## 2016-06-19 NOTE — Plan of Care (Signed)
Problem: Safety: Goal: Periods of time without injury will increase Outcome: Progressing Patient contracts for safety on the unit and is on q15 safety checks.

## 2016-06-19 NOTE — Progress Notes (Signed)
Referral for ECT sent to Dr. Weber Cooks. Awaiting call back for time and date of appointment.  Georga Kaufmann, MSW, [place]

## 2016-06-19 NOTE — Progress Notes (Signed)
D:  Patient's self inventory sheet, patient has poor sleep, no sleep medication given.  Good appetite, low energy level, poor concentration.  Rated depression 6-7, hopeless 7, anxiety 8.  Denied withdrawals.  SI, sometimes, contracts for safety.  Physical problems, lightheaded, dizziness.  Physical pain, worst pain #6, post stroke central left side.  No pain medications.  Goal is to keep moving forward, no matter how slow.    Plans to pray, meditate, seek help in group.  No discharge plans. A:  Medications administered per MD orders.  Emotional support and encouragement given patient. R:  SI, off/on, contracts for safety, no plan.  Denied HI.  Denied A/V hallucinations.  Safety maintained with 15 minute checks.

## 2016-06-19 NOTE — ED Notes (Signed)
Pt being reviewed for possible admission to [facility]. H&P and Assessment have been faxed to the [facility] Unit for the charge nurse to review and provide bed assignment.      Nicole Sindee Stucker, MS, NCC, LPCA Therapeutic Triage Specialist  

## 2016-06-20 ENCOUNTER — Inpatient Hospital Stay
Admission: RE | Admit: 2016-06-20 | Discharge: 2016-06-27 | DRG: 885 | Disposition: A | Discharge status: Home / Self Care | Payer: Medicare Other | Source: Other Acute Inpatient Hospital | Attending: Psychiatry | Admitting: Psychiatry

## 2016-06-20 ENCOUNTER — Encounter: Payer: Self-pay | Admitting: Psychiatry

## 2016-06-20 DIAGNOSIS — T1491XA Suicide attempt, initial encounter: Secondary | ICD-10-CM | POA: Diagnosis not present

## 2016-06-20 DIAGNOSIS — E7800 Pure hypercholesterolemia, unspecified: Secondary | ICD-10-CM | POA: Diagnosis present

## 2016-06-20 DIAGNOSIS — T447X2A Poisoning by beta-adrenoreceptor antagonists, intentional self-harm, initial encounter: Secondary | ICD-10-CM | POA: Diagnosis not present

## 2016-06-20 DIAGNOSIS — Z818 Family history of other mental and behavioral disorders: Secondary | ICD-10-CM | POA: Diagnosis not present

## 2016-06-20 DIAGNOSIS — Z7902 Long term (current) use of antithrombotics/antiplatelets: Secondary | ICD-10-CM | POA: Diagnosis not present

## 2016-06-20 DIAGNOSIS — Z01810 Encounter for preprocedural cardiovascular examination: Secondary | ICD-10-CM | POA: Diagnosis not present

## 2016-06-20 DIAGNOSIS — I739 Peripheral vascular disease, unspecified: Secondary | ICD-10-CM | POA: Diagnosis present

## 2016-06-20 DIAGNOSIS — F1094 Alcohol use, unspecified with alcohol-induced mood disorder: Secondary | ICD-10-CM | POA: Diagnosis present

## 2016-06-20 DIAGNOSIS — G4700 Insomnia, unspecified: Secondary | ICD-10-CM | POA: Diagnosis present

## 2016-06-20 DIAGNOSIS — E669 Obesity, unspecified: Secondary | ICD-10-CM | POA: Diagnosis present

## 2016-06-20 DIAGNOSIS — G4730 Sleep apnea, unspecified: Secondary | ICD-10-CM | POA: Diagnosis present

## 2016-06-20 DIAGNOSIS — F332 Major depressive disorder, recurrent severe without psychotic features: Principal | ICD-10-CM | POA: Diagnosis present

## 2016-06-20 DIAGNOSIS — I10 Essential (primary) hypertension: Secondary | ICD-10-CM | POA: Diagnosis not present

## 2016-06-20 DIAGNOSIS — F429 Obsessive-compulsive disorder, unspecified: Secondary | ICD-10-CM | POA: Diagnosis present

## 2016-06-20 DIAGNOSIS — M4712 Other spondylosis with myelopathy, cervical region: Secondary | ICD-10-CM | POA: Diagnosis present

## 2016-06-20 DIAGNOSIS — Z8774 Personal history of (corrected) congenital malformations of heart and circulatory system: Secondary | ICD-10-CM

## 2016-06-20 DIAGNOSIS — R200 Anesthesia of skin: Secondary | ICD-10-CM

## 2016-06-20 DIAGNOSIS — G5600 Carpal tunnel syndrome, unspecified upper limb: Secondary | ICD-10-CM | POA: Diagnosis present

## 2016-06-20 DIAGNOSIS — Z8249 Family history of ischemic heart disease and other diseases of the circulatory system: Secondary | ICD-10-CM

## 2016-06-20 DIAGNOSIS — Z888 Allergy status to other drugs, medicaments and biological substances status: Secondary | ICD-10-CM | POA: Diagnosis not present

## 2016-06-20 DIAGNOSIS — I2510 Atherosclerotic heart disease of native coronary artery without angina pectoris: Secondary | ICD-10-CM | POA: Diagnosis present

## 2016-06-20 DIAGNOSIS — F411 Generalized anxiety disorder: Secondary | ICD-10-CM | POA: Diagnosis present

## 2016-06-20 DIAGNOSIS — F4310 Post-traumatic stress disorder, unspecified: Secondary | ICD-10-CM | POA: Diagnosis present

## 2016-06-20 DIAGNOSIS — Z7982 Long term (current) use of aspirin: Secondary | ICD-10-CM

## 2016-06-20 DIAGNOSIS — N529 Male erectile dysfunction, unspecified: Secondary | ICD-10-CM | POA: Diagnosis present

## 2016-06-20 DIAGNOSIS — M25519 Pain in unspecified shoulder: Secondary | ICD-10-CM | POA: Diagnosis present

## 2016-06-20 DIAGNOSIS — G8929 Other chronic pain: Secondary | ICD-10-CM | POA: Diagnosis present

## 2016-06-20 DIAGNOSIS — R05 Cough: Secondary | ICD-10-CM | POA: Diagnosis not present

## 2016-06-20 DIAGNOSIS — Z638 Other specified problems related to primary support group: Secondary | ICD-10-CM

## 2016-06-20 DIAGNOSIS — R45851 Suicidal ideations: Secondary | ICD-10-CM | POA: Diagnosis present

## 2016-06-20 DIAGNOSIS — R296 Repeated falls: Secondary | ICD-10-CM | POA: Diagnosis present

## 2016-06-20 DIAGNOSIS — Z823 Family history of stroke: Secondary | ICD-10-CM

## 2016-06-20 DIAGNOSIS — G43909 Migraine, unspecified, not intractable, without status migrainosus: Secondary | ICD-10-CM | POA: Diagnosis present

## 2016-06-20 DIAGNOSIS — E785 Hyperlipidemia, unspecified: Secondary | ICD-10-CM | POA: Diagnosis present

## 2016-06-20 DIAGNOSIS — F339 Major depressive disorder, recurrent, unspecified: Secondary | ICD-10-CM | POA: Diagnosis not present

## 2016-06-20 DIAGNOSIS — M1990 Unspecified osteoarthritis, unspecified site: Secondary | ICD-10-CM | POA: Diagnosis present

## 2016-06-20 DIAGNOSIS — Z79899 Other long term (current) drug therapy: Secondary | ICD-10-CM | POA: Diagnosis not present

## 2016-06-20 DIAGNOSIS — Z87891 Personal history of nicotine dependence: Secondary | ICD-10-CM

## 2016-06-20 DIAGNOSIS — Z6830 Body mass index (BMI) 30.0-30.9, adult: Secondary | ICD-10-CM

## 2016-06-20 DIAGNOSIS — Z960 Presence of urogenital implants: Secondary | ICD-10-CM

## 2016-06-20 DIAGNOSIS — Z91030 Bee allergy status: Secondary | ICD-10-CM

## 2016-06-20 DIAGNOSIS — Z8673 Personal history of transient ischemic attack (TIA), and cerebral infarction without residual deficits: Secondary | ICD-10-CM

## 2016-06-20 DIAGNOSIS — Z915 Personal history of self-harm: Secondary | ICD-10-CM

## 2016-06-20 DIAGNOSIS — R079 Chest pain, unspecified: Secondary | ICD-10-CM | POA: Diagnosis not present

## 2016-06-20 DIAGNOSIS — F1010 Alcohol abuse, uncomplicated: Secondary | ICD-10-CM | POA: Diagnosis present

## 2016-06-20 MED ORDER — ADULT MULTIVITAMIN W/MINERALS CH: 1.0000 | ORAL_TABLET | Freq: Every day | ORAL | Status: DC

## 2016-06-20 MED ORDER — LAMOTRIGINE 100 MG PO TABS: 100.0000 mg | ORAL_TABLET | Freq: Two times a day (BID) | ORAL | Status: DC

## 2016-06-20 MED ORDER — MIRTAZAPINE 15 MG PO TABS
7.5000 mg | ORAL_TABLET | Freq: Every day | ORAL | Status: DC
  Administered 2016-06-21 – 2016-06-26 (×6): 7.5 mg via ORAL
  Filled 2016-06-20: qty 1
  Filled 2016-06-20: qty 2
  Filled 2016-06-20 (×4): qty 1

## 2016-06-20 MED ORDER — CLOPIDOGREL BISULFATE 75 MG PO TABS: 75.0000 mg | ORAL_TABLET | Freq: Every day | ORAL | Status: DC

## 2016-06-20 MED ORDER — HYDROXYZINE HCL 25 MG PO TABS: 25.0000 mg | ORAL_TABLET | Freq: Three times a day (TID) | ORAL | 0 refills | Status: DC | PRN

## 2016-06-20 MED ORDER — VITAMIN B-1 100 MG PO TABS: 100.0000 mg | ORAL_TABLET | Freq: Every day | ORAL | Status: DC

## 2016-06-20 MED ORDER — ALUM & MAG HYDROXIDE-SIMETH 200-200-20 MG/5ML PO SUSP: 30.0000 mL | ORAL | 0 refills | Status: DC | PRN

## 2016-06-20 MED ORDER — VENLAFAXINE HCL ER 75 MG PO CP24
225.0000 mg | ORAL_CAPSULE | Freq: Every day | ORAL | Status: DC
  Administered 2016-06-21: 225 mg via ORAL
  Filled 2016-06-20: qty 3

## 2016-06-20 MED ORDER — ALUM & MAG HYDROXIDE-SIMETH 200-200-20 MG/5ML PO SUSP: 30.0000 mL | ORAL | Status: DC | PRN

## 2016-06-20 MED ORDER — MAGNESIUM HYDROXIDE 400 MG/5ML PO SUSP: 30.0000 mL | Freq: Every day | ORAL | Status: DC | PRN

## 2016-06-20 MED ORDER — ACETAMINOPHEN 500 MG PO TABS: 1000.0000 mg | ORAL_TABLET | Freq: Four times a day (QID) | ORAL | 0 refills | Status: DC | PRN

## 2016-06-20 MED ORDER — ASPIRIN 325 MG PO TBEC: 325.0000 mg | DELAYED_RELEASE_TABLET | Freq: Every day | ORAL | Status: DC

## 2016-06-20 MED ORDER — CLOPIDOGREL BISULFATE 75 MG PO TABS
75.0000 mg | ORAL_TABLET | Freq: Every day | ORAL | Status: DC
  Administered 2016-06-21 – 2016-06-27 (×7): 75 mg via ORAL
  Filled 2016-06-20 (×8): qty 1

## 2016-06-20 MED ORDER — LAMOTRIGINE 25 MG PO TABS
50.0000 mg | ORAL_TABLET | Freq: Two times a day (BID) | ORAL | Status: DC
  Administered 2016-06-21 – 2016-06-27 (×11): 50 mg via ORAL
  Filled 2016-06-20 (×12): qty 2

## 2016-06-20 MED ORDER — ACETAMINOPHEN 325 MG PO TABS
650.0000 mg | ORAL_TABLET | Freq: Four times a day (QID) | ORAL | Status: DC | PRN
  Administered 2016-06-22 – 2016-06-25 (×5): 650 mg via ORAL
  Filled 2016-06-20 (×5): qty 2

## 2016-06-20 MED ORDER — VENLAFAXINE HCL ER 75 MG PO CP24: 225.0000 mg | ORAL_CAPSULE | Freq: Every day | ORAL | Status: DC

## 2016-06-20 MED ORDER — MIRTAZAPINE 7.5 MG PO TABS: 7.5000 mg | ORAL_TABLET | Freq: Every day | ORAL | Status: DC

## 2016-06-20 MED ORDER — FISH OIL 1200 MG PO CAPS: 1.0000 | ORAL_CAPSULE | Freq: Every day | ORAL | Status: DC

## 2016-06-20 MED ORDER — MAGNESIUM HYDROXIDE 400 MG/5ML PO SUSP: 30.0000 mL | Freq: Every day | ORAL | 0 refills | Status: DC | PRN

## 2016-06-20 NOTE — Tx Team (Signed)
Initial Treatment Plan 06/20/2016 10:20 PM [name] [id number]  Depression Suicidal ideations Anxiety  PATIENT STRESSORS: Health problems Loss of comfort  Substance abuse   PATIENT STRENGTHS: Ability for insight Communication skills General fund of knowledge Supportive family/friends   PATIENT IDENTIFIED PROBLEMS: depression  anxiety  Suicidal thoughts                 DISCHARGE CRITERIA:  Ability to meet basic life and health needs Improved stabilization in mood, thinking, and/or behavior Medical problems require only outpatient monitoring Motivation to continue treatment in a less acute level of care  PRELIMINARY DISCHARGE PLAN: Attend aftercare/continuing care group Attend PHP/IOP Outpatient therapy Return to previous living arrangement  PATIENT/FAMILY INVOLVEMENT: This treatment plan has been presented to and reviewed with the patient, [name]. The patient  has been given the opportunity to ask questions and make suggestions.  Ronelle Nigh, RN 06/20/2016, 10:20 PM

## 2016-06-20 NOTE — BHH Suicide Risk Assessment (Signed)
[facility] Discharge Suicide Risk Assessment   Principal Problem:  Major Depression, Suicidal Attempt  Discharge Diagnoses:  Patient Active Problem List   Diagnosis Date Noted  . Alcohol-induced mood disorder ([place]) [F10.94] 06/10/2016  . Intentional overdose of beta-adrenergic blocking drug ([place]) [T44.7X2A] 06/07/2016  . Suicide attempt ([place]) [T14.91XA] 06/07/2016  . Overdose, intentional self-harm, initial encounter ([place]) [T50.902A] 06/07/2016  . Confusion [R41.0] 03/02/2015  . Fall [W19.XXXA] 03/02/2015  . Alcohol abuse [F10.10]   . Frequent falls [R29.6]   . Acute ischemic stroke ([place]) [I63.9] 12/17/2014  . CVA (cerebral infarction) [I63.9] 12/17/2014  . Impotence [N52.9] 01/24/2013  . Acute, but ill-defined, cerebrovascular disease [I67.89] 09/20/2012  . Anxiety state [F41.1] 09/20/2012  . Other diseases of lung, not elsewhere classified [J98.4] 09/20/2012  . Unspecified transient cerebral ischemia [G45.9] 09/20/2012  . Hypotension [I95.9] 08/26/2012  . Bradycardia [R00.1] 08/26/2012  . Altered mental status [R41.82] 07/19/2011  . PATENT FORAMEN OVALE [Q21.1] 06/12/2008  . DYSLIPIDEMIA [E78.5] 06/07/2008  . OBESITY [E66.9] 06/07/2008  . OBSESSIVE-COMPULSIVE DISORDER [F42.9] 06/07/2008  . ERECTILE DYSFUNCTION [F52.8] 06/07/2008  . PTSD [F43.10] 06/07/2008  . DEPRESSION [F32.9] 06/07/2008  . MIGRAINE, CHRONIC [G43.909] 06/07/2008  . CARPAL TUNNEL SYNDROME [G56.00] 06/07/2008  . Essential hypertension [I10] 06/07/2008  . CVA [I63.50] 06/07/2008  . Cervical spondylosis with myelopathy [M47.12] 12/20/2007    Total Time spent with patient: 30 minutes  Musculoskeletal: Strength & Muscle Tone: within normal limits Gait & Station: normal Patient leans: N/A  Psychiatric Specialty Exam: ROS denies chest pain, no shortness of breath, no nausea, no vomiting   Blood pressure (!) 145/85, pulse 72, temperature 97.7 F (36.5 C), resp. rate 16, height 6\' 1"  (1.854 m), weight 106.6 kg (235  lb), SpO2 99 %.Body mass index is 31 kg/m.  General Appearance: Fairly Groomed  Engineer, water::  Good  Speech:  Normal Rate409  Volume:  Normal  Mood:  Depressed  Affect:  slightly improved, but overall still constricted, vaguely dysphoric  Thought Process:  Linear and Descriptions of Associations: Intact  Orientation:  Full (Time, Place, and Person)  Thought Content:  no hallucinations, no delusions, not internally preoccupied, ruminative about family stressors , feeling distant from his adult children  Suicidal Thoughts:  passive SI,but denies any plan or intention, contracts for safety at this time  Homicidal Thoughts:  No denies any homicidal or violent ideations at this time  Memory:  recent and remote grossly intact   Judgement:  Other:  improving   Insight:  improving   Psychomotor Activity:  Decreased  Concentration:  Good  Recall:  Good  Fund of Knowledge:Good  Language: Good  Akathisia:  Negative  Handed:  Right  AIMS (if indicated):     Assets:  Communication Skills Desire for Improvement Resilience  Sleep:  Number of Hours: 5.75  Cognition: WNL  ADL's: fair    Mental Status Per Nursing Assessment::   On Admission:  Suicidal ideation indicated by patient  Demographic Factors:  66 year old male, retired, Actor, lives with wife , has three adult children   Loss Factors: Wife has been diagnosed with lung cancer, distant, strained relationships with adult children   Historical Factors: Reports history of chronic depression, prior history of alcohol use disorder   Risk Reduction Factors:   Sense of responsibility to family, Living with another person, especially a relative and Positive coping skills or problem solving skills  Continued Clinical Symptoms:  Patient presents with partial improvement compared to admission, but remains depressed, sad  and vaguely irritable. Continues to ruminate about his stressors, mainly family issues, reports passive SI, but  denies any plan or intention of hurting self and contracts for safety on unit at this time, no HI, no hallucinations,no delusions . Patient's behavior is calm, in good control. Denies medication side effects. He has expressed significant interest in ECT as a treatment option for his chronic depression. His case has been reviewed by Dr. Carlena Hurl, who has agreed ECT may be beneficial and for patient to be transferred to [facility] inpatient psychiatric unit to start ECT course.   Cognitive Features That Contribute To Risk:  No gross cognitive deficits noted upon discharge. Is alert , attentive, and oriented x 3   Suicide Risk:  Moderate:  Frequent suicidal ideation with limited intensity, and duration, some specificity in terms of plans, no associated intent, good self-control, limited dysphoria/symptomatology, some risk factors present, and identifiable protective factors, including available and accessible social support.  Follow-up Information    [facility] Follow up on 07/07/2016.   Why:  at 1:30pm for medication managment with Dr. Ralph Leyden. Contact information: [place] [address] [place], [place] [address] Phone: [phone] Fax: [phone]       [facility] Follow up on 06/25/2016.   Why:  Therapy appointment at 10:15AM with Nicanor Alcon. Contact information: [place] [address] Phone: [phone] Fax: [phone]        [facility] Follow up.   Specialty:  Behavioral Health Why:  ECT referral needed. Contact information: [place] [phone]          Plan Of Care/Follow-up recommendations:  Activity:  as tolerated  Diet:  Regular Tests:  NA Other:  See below  Patient is being discharged from [facility] to the inpatient psychiatric unit at [facility], where he will be under the care of Dr. Carlena Hurl, for ECT  management. He expresses motivation and hope that this treatment modality will help  Jenne Campus, MD 06/20/2016, 2:32 PM

## 2016-06-20 NOTE — Progress Notes (Signed)
Patient ID: [name], male   DOB: Nov 25, 1950, 66 y.o.   MRN: [id number] D: client pleasant, expressing hopefulness as he leaves for [place]. "I hope this work, nothing else has" "I'm going to try this eight years of medication hasn't done it" client denies AVH, [place]. A: Writer provided emotional support, encouraged client to go with a positive mind. Client"s  belonging retrieved, assisted by R. English, Security. "I'll take these and hand them over to security at [facility]." R: client given to [facility] for transport.

## 2016-06-20 NOTE — BHH Group Notes (Addendum)
Patient presents as a 66 Y O  Male with chief complaint of Depression. Transferred from [facility] in [place], seeking ECT services, under Dr Weber Cooks care. Patient reports that he has been suffering from depression for about 8 years, and this was worsened by the stroke experienced recently. Presents with weakness in the right side, related to this stroke. Additional stressors include wife's deteriorating health. Patient expressing helplessness and hopelessness "I heard some people benefit from ECT, I am going to  try it and see...". Patient's admission assessment complete. Skin assessment performed by this RN,  staffed with Cyril Mourning, [place]. No abnormal findings. Patient is admitted and oriented to the unit and safety precautions initiated.  0600: patient slept all night without any discomfort. Safety precautions maintained.

## 2016-06-20 NOTE — Tx Team (Signed)
Interdisciplinary Treatment and Diagnostic Plan Update 06/20/2016 Time of Session: 9:30am  [name]  MRN: [id number]  Principal Diagnosis: Major Depression, Recurrent , Severe  Secondary Diagnoses: Active Problems:   Alcohol-induced mood disorder (HCC)   Severe episode of recurrent major depressive disorder, without psychotic features ([place])   Current Medications:  Current Facility-Administered Medications  Medication Dose Route Frequency Provider Last Rate Last Dose  . acetaminophen (TYLENOL) tablet 650 mg  650 mg Oral Q6H PRN Laverle Hobby, PA-C   650 mg at 06/20/16 1140  . alum & mag hydroxide-simeth (MAALOX/MYLANTA) 200-200-20 MG/5ML suspension 30 mL  30 mL Oral Q4H PRN Laverle Hobby, PA-C      . aspirin EC tablet 325 mg  325 mg Oral Daily Laverle Hobby, PA-C   325 mg at 06/20/16 0818  . clopidogrel (PLAVIX) tablet 75 mg  75 mg Oral Daily Laverle Hobby, PA-C   75 mg at 06/20/16 4481  . hydrOXYzine (ATARAX/VISTARIL) tablet 25 mg  25 mg Oral TID PRN Rozetta Nunnery, NP   25 mg at 06/20/16 1140  . lamoTRIgine (LAMICTAL) tablet 100 mg  100 mg Oral BID Laverle Hobby, PA-C   100 mg at 06/20/16 0820  . magnesium hydroxide (MILK OF MAGNESIA) suspension 30 mL  30 mL Oral Daily PRN Laverle Hobby, PA-C      . mirtazapine (REMERON) tablet 7.5 mg  7.5 mg Oral QHS Myer Peer Cobos, MD   7.5 mg at 06/19/16 2204  . multivitamin with minerals tablet 1 tablet  1 tablet Oral Daily Jenne Campus, MD   1 tablet at 06/14/16 0836  . omega-3 acid ethyl esters (LOVAZA) capsule 1 g  1 g Oral Daily Laverle Hobby, PA-C   1 g at 06/13/16 8563  . thiamine (VITAMIN B-1) tablet 100 mg  100 mg Oral Daily Laverle Hobby, PA-C   100 mg at 06/14/16 1497  . venlafaxine XR (EFFEXOR-XR) 24 hr capsule 225 mg  225 mg Oral Daily Jenne Campus, MD   225 mg at 06/20/16 0818    PTA Medications: Prescriptions Prior to Admission  Medication Sig Dispense Refill Last Dose  . Cyanocobalamin (VITAMIN B  12 PO) Take 1 tablet by mouth daily.   Past Week at Unknown time  . gabapentin (NEURONTIN) 300 MG capsule Take 600 mg by mouth at bedtime.   Past Week at Unknown time  . lamoTRIgine (LAMICTAL) 100 MG tablet Take 100 mg by mouth 2 (two) times daily.    06/06/2016 at Unknown time  . Multiple Vitamins-Minerals (CENTRUM SILVER ULTRA MENS PO) Take 1 tablet by mouth daily.   06/06/2016 at Unknown time  . venlafaxine XR (EFFEXOR-XR) 75 MG 24 hr capsule Take 225 mg by mouth daily.    06/06/2016 at Unknown time  . [DISCONTINUED] acetaminophen (TYLENOL) 500 MG tablet Take 1,000 mg by mouth every 6 (six) hours as needed for mild pain or moderate pain. For pain   Past Week at Unknown time  . [DISCONTINUED] aspirin 325 MG EC tablet Take 325 mg by mouth daily.   06/06/2016 at Unknown time  . [DISCONTINUED] clopidogrel (PLAVIX) 75 MG tablet Take 75 mg by mouth daily.    06/06/2016 at Unknown time  . [DISCONTINUED] Omega-3 Fatty Acids (FISH OIL) 1200 MG CAPS Take 1 capsule by mouth daily.    06/06/2016 at Unknown time  . [DISCONTINUED] thiamine (VITAMIN B-1) 100 MG tablet Take 100 mg by mouth daily.   Past  Week at Unknown time    Treatment Modalities: Medication Management, Group therapy, Case management,  1 to 1 session with clinician, Psychoeducation, Recreational therapy.  Patient Stressors: Health problems Substance abuse Traumatic event Patient Strengths: Ability for insight Average or above average intelligence Occupational psychologist fund of knowledge Motivation for treatment/growth Supportive family/friends  Physician Treatment Plan for Primary Diagnosis: Major Depression, Recurrent , Severe Long Term Goal(s): Improvement in symptoms so as ready for discharge Short Term Goals: Ability to verbalize feelings will improve Ability to disclose and discuss suicidal ideas Ability to demonstrate self-control will improve Ability to identify and develop effective coping behaviors will  improve Ability to maintain clinical measurements within normal limits will improve Compliance with prescribed medications will improve Ability to identify triggers associated with substance abuse/mental health issues will improve  Medication Management: Evaluate patient's response, side effects, and tolerance of medication regimen.  Therapeutic Interventions: 1 to 1 sessions, Unit Group sessions and Medication administration.  Evaluation of Outcomes: Progressing  Physician Treatment Plan for Secondary Diagnosis: Active Problems:   Alcohol-induced mood disorder (HCC)   Severe episode of recurrent major depressive disorder, without psychotic features ([place])  Long Term Goal(s): Improvement in symptoms so as ready for discharge  Short Term Goals: Ability to verbalize feelings will improve Ability to disclose and discuss suicidal ideas Ability to demonstrate self-control will improve Ability to identify and develop effective coping behaviors will improve Ability to maintain clinical measurements within normal limits will improve Compliance with prescribed medications will improve Ability to identify triggers associated with substance abuse/mental health issues will improve  Medication Management: Evaluate patient's response, side effects, and tolerance of medication regimen.  Therapeutic Interventions: 1 to 1 sessions, Unit Group sessions and Medication administration.  Evaluation of Outcomes: Progressing  RN Treatment Plan for Primary Diagnosis: Major Depression, Recurrent , Severe Long Term Goal(s): Knowledge of disease and therapeutic regimen to maintain health will improve  Short Term Goals: Ability to remain free from injury will improve and Compliance with prescribed medications will improve  Medication Management: RN will administer medications as ordered by provider, will assess and evaluate patient's response and provide education to patient for prescribed medication. RN will  report any adverse and/or side effects to prescribing provider.  Therapeutic Interventions: 1 on 1 counseling sessions, Psychoeducation, Medication administration, Evaluate responses to treatment, Monitor vital signs and CBGs as ordered, Perform/monitor CIWA, COWS, AIMS and Fall Risk screenings as ordered, Perform wound care treatments as ordered.  Evaluation of Outcomes: Progressing  LCSW Treatment Plan for Primary Diagnosis: Major Depression, Recurrent , Severe Long Term Goal(s): Safe transition to appropriate next level of care at discharge, Engage patient in therapeutic group addressing interpersonal concerns. Short Term Goals: Engage patient in aftercare planning with referrals and resources, Increase ability to appropriately verbalize feelings, Identify triggers associated with mental health/substance abuse issues and Increase skills for wellness and recovery  Therapeutic Interventions: Assess for all discharge needs, 1 to 1 time with Social worker, Explore available resources and support systems, Assess for adequacy in community support network, Educate family and significant other(s) on suicide prevention, Complete Psychosocial Assessment, Interpersonal group therapy.  Evaluation of Outcomes: Progressing  Progress in Treatment: Attending groups: Intermittently   Participating in groups: Yes, when he attends   Taking medication as prescribed: Yes, MD continues to assess for medication changes as needed Toleration medication: Yes, no side effects reported at this time Family/Significant other contact made: Yes, pt's wife contacted. Patient understands diagnosis: Developing insight  Discussing patient  identified problems/goals with staff: Yes Medical problems stabilized or resolved: Yes Denies suicidal/homicidal ideation: No, pt endorses passive SI Issues/concerns per patient self-inventory: None Other: N/A  New problem(s) identified: None identified at this time.   New Short  Term/Long Term Goal(s): None identified at this time.   Discharge Plan or Barriers: Pt will return home and follow up with an outpatient provider. Pt may also be a good fit for IOP.  06/20/16: Pt being transferred inpatient to [facility] to receive ECT treatment.  Reason for Continuation of Hospitalization:  Anxiety  Depression Medication stabilization Suicidal ideation  Estimated Length of Stay: 1-3 days  Attendees: Patient: 06/20/2016 4:02 PM  Physician: Dr. Parke Poisson 06/20/2016 4:02 PM  Nursing: Benjamine Mola RN; Opal Sidles, RN 06/20/2016 4:02 PM  RN Care Manager: Lars Pinks, RN 06/20/2016 4:02 PM  Social Worker: Matthew Saras, [place] 06/20/2016 4:02 PM  Recreational Therapist:  06/20/2016 4:02 PM  Other: Lindell Spar, NP; Samuel Jester, NP 06/20/2016 4:02 PM  Other:  06/20/2016 4:02 PM  Other: 06/20/2016 4:02 PM   Scribe for Treatment Team: Georga Kaufmann, MSW,LCSWA 06/20/2016 4:02 PM

## 2016-06-20 NOTE — Progress Notes (Signed)
Pt will be transferred to [facility] after 7:30pm today. Pt is voluntary and has signed a consent for treatment form. CSW faxed  treatment consent form via fax number [phone]. CSW also reviewed the plan with the pt and he is agreeable. Pt states he is looking forward to beginning ECT.  Georga Kaufmann, MSW, [place]

## 2016-06-20 NOTE — BHH Group Notes (Signed)
[facility] LCSW Aftercare Discharge Planning Group Note   06/20/2016  8:45 AM  Participation Quality:    Mood/Affect:  Anxious  Depression Rating:  6  Anxiety Rating:  7  Thoughts of Suicide:  No  Current AVH:  No  Plan for Discharge/Comments: Pt is feeling anxious about his upcoming discharge. Pt states that he feels hopeful that the ECT treatments will work. However, he also reported being nervous because he feels this is a "last resort effort". Pt stated "If this doesn't work for me then what will I try next? I would feel hopeless afterwards". CSW provided emotional support.  Transportation Means: Pt has access to transporation  Supports: [place], MSW, [facility] 06/20/2016 10:07 AM
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
## 2016-06-20 NOTE — BHH Group Notes (Signed)
[facility] LCSW Group Therapy 06/20/2016 1:15pm  Type of Therapy: Group Therapy- Feelings Around Relapse and Recovery  Participation Level: Pt invited. Did not attend.   Georga Kaufmann, MSW, [place] 06/20/2016 3:51 PM

## 2016-06-20 NOTE — ED Notes (Signed)
Patient is to be admitted to [facility] by Dr. Weber Cooks.  Attending Physician will be Dr. Weber Cooks.   Patient has been assigned to room 301, by [facility].   Intake Paper Work has been signed and placed on patient chart.  [facility] Patient Access staff is aware of the admission    Representative was Jeani Hawking (swk) who will be faxing pts IVC documentation.

## 2016-06-20 NOTE — Progress Notes (Signed)
D: Patient's self inventory sheet: patient has good sleep, did not recieve sleep medication.good  Appetite, normal energy level, poor concentration. Rated depression 6/10, hopeless 6/10, anxiety 8/10. SI/HI/AVH: Continues to endorse depression and suicidal ideation, contracts for safety verbally. Physical complaints are pain r/t post stroke. Goal is "being positive with what's ahead". Plans to work on "being positive with what's ahead".   A: Medications administered, assessed medication knowledge and education given on medication regimen.  Emotional support and encouragement given patient. R: Denies SI and HI , contracts for safety. Safety maintained with 15 minute checks.

## 2016-06-20 NOTE — Progress Notes (Signed)
Recreation Therapy Notes  Date: 06/20/16 Time: 0930 Location: 400 Hall Dayroom  Group Topic: Stress Management  Goal Area(s) Addresses:  Patient will verbalize importance of using healthy stress management.  Patient will identify positive emotions associated with healthy stress management.   Behavioral Response: Engaged  Intervention: Stress Management  Activity :  Meditation.  LRT introduced the stress management technique of meditation.  LRT played a meditation on the importance of letting go of past events and be in the moment.  Patients were to follow along as the meditation played to fully engage in the technique.  Education:  Stress Management, Discharge Planning.   Education Outcome: Acknowledges edcuation/In group clarification offered/Needs additional education  Clinical Observations/Feedback: Pt attended group.    Victorino Sparrow, LRT/CTRS         Ria Comment, Nyashia Raney A 06/20/2016 11:12 AM

## 2016-06-21 ENCOUNTER — Other Ambulatory Visit: Payer: Self-pay | Admitting: Psychiatry

## 2016-06-21 ENCOUNTER — Inpatient Hospital Stay: Payer: Medicare Other

## 2016-06-21 DIAGNOSIS — F332 Major depressive disorder, recurrent severe without psychotic features: Principal | ICD-10-CM

## 2016-06-21 LAB — CBC
HCT: 43.5 % (ref 40.0–52.0)
Hemoglobin: 14.8 g/dL (ref 13.0–18.0)
MCH: 32.4 pg (ref 26.0–34.0)
MCHC: 34 g/dL (ref 32.0–36.0)
MCV: 95.2 fL (ref 80.0–100.0)
Platelets: 266 10*3/uL (ref 150–440)
RBC: 4.56 MIL/uL (ref 4.40–5.90)
RDW: 13 % (ref 11.5–14.5)
WBC: 6.9 10*3/uL (ref 3.8–10.6)

## 2016-06-21 LAB — COMPREHENSIVE METABOLIC PANEL
ALT: 33 U/L (ref 17–63)
AST: 23 U/L (ref 15–41)
Albumin: 4.1 g/dL (ref 3.5–5.0)
Alkaline Phosphatase: 64 U/L (ref 38–126)
Anion gap: 10 (ref 5–15)
BUN: 22 mg/dL — ABNORMAL HIGH (ref 6–20)
CO2: 27 mmol/L (ref 22–32)
Calcium: 9.2 mg/dL (ref 8.9–10.3)
Chloride: 101 mmol/L (ref 101–111)
Creatinine, Ser: 0.79 mg/dL (ref 0.61–1.24)
GFR calc Af Amer: >60 mL/min (ref >60)
GFR calc non Af Amer: >60 mL/min (ref >60)
Glucose, Bld: 102 mg/dL — ABNORMAL HIGH (ref 65–99)
Potassium: 4.1 mmol/L (ref 3.5–5.1)
Sodium: 138 mmol/L (ref 135–145)
Total Bilirubin: 0.5 mg/dL (ref 0.3–1.2)
Total Protein: 7 g/dL (ref 6.5–8.1)

## 2016-06-21 LAB — URINALYSIS, COMPLETE (UACMP) WITH MICROSCOPIC
Bacteria, UA: NONE SEEN
Bilirubin Urine: NEGATIVE
Glucose, UA: NEGATIVE mg/dL
Hgb urine dipstick: NEGATIVE
Ketones, ur: NEGATIVE mg/dL
Leukocytes, UA: NEGATIVE
Nitrite: NEGATIVE
Protein, ur: NEGATIVE mg/dL
RBC / HPF: NONE SEEN RBC/hpf (ref 0–5)
Specific Gravity, Urine: 1.005 (ref 1.005–1.030)
Squamous Epithelial / LPF: NONE SEEN
WBC, UA: NONE SEEN WBC/hpf (ref 0–5)
pH: 7 (ref 5.0–8.0)

## 2016-06-21 MED ORDER — VENLAFAXINE HCL ER 75 MG PO CP24
150.0000 mg | ORAL_CAPSULE | Freq: Every day | ORAL | Status: DC
  Administered 2016-06-22 – 2016-06-27 (×6): 150 mg via ORAL
  Filled 2016-06-21 (×6): qty 2

## 2016-06-21 MED ORDER — SODIUM CHLORIDE 0.9 % IV SOLN
500.0000 mL | Freq: Once | INTRAVENOUS | Status: AC
  Administered 2016-06-23: 12:00:00 via INTRAVENOUS

## 2016-06-21 NOTE — BHH Suicide Risk Assessment (Signed)
[place] INPATIENT:  Family/Significant Other Suicide Prevention Education  Suicide Prevention Education:  Education Completed;[name](wife [phone]), has been identified by the patient as the family member/significant other with whom the patient will be residing, and identified as the person(s) who will aid the patient in the event of a mental health crisis (suicidal ideations/suicide attempt).  With written consent from the patient, the family member/significant other has been provided the following suicide prevention education, prior to the and/or following the discharge of the patient.  The suicide prevention education provided includes the following:  Suicide risk factors  Suicide prevention and interventions  National Suicide Hotline telephone number  [facility] assessment telephone number  [facility] Emergency Assistance [place] and/or Residential Mobile Crisis Unit telephone number  Request made of family/significant other to:  Remove weapons (e.g., guns, rifles, knives), all items previously/currently identified as safety concern.    Remove drugs/medications (over-the-counter, prescriptions, illicit drugs), all items previously/currently identified as a safety concern.  The family member/significant other verbalizes understanding of the suicide prevention education information provided.  The family member/significant other agrees to remove the items of safety concern listed above.  [name], [place] 06/21/2016, 11:35 AM
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
## 2016-06-21 NOTE — [facility] Group Notes (Signed)
[place] LCSW Group Therapy  06/21/2016 1:59 PM  Type of Therapy:  Group Therapy  Participation Level:  Minimal  Participation Quality:  Attentive  Affect:  Appropriate  Cognitive:  Alert  Insight:  Improving  Engagement in Therapy:  Engaged  Modes of Intervention:  Activity, Discussion, Education, Problem-solving, Art therapist, Socialization and Support  Summary of Progress/Problems: Communications: Patients identify how individuals communicate with one another appropriately and inappropriately. Patients will be guided to discuss their thoughts, feelings, and behaviors related to barriers when communicating. The group will process together ways to execute positive and appropriate communications. Patient stated he would like to improve communication with his wife. CSW discussed the different styles of communication and discussed several examples.    [name] G. Claybon Jabs MSW, [place] 06/21/2016, 2:01 PM

## 2016-06-21 NOTE — Plan of Care (Signed)
Problem: Coping: Goal: Ability to cope will improve Outcome: Progressing Working on coping skills   
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
## 2016-06-21 NOTE — BHH Group Notes (Signed)
[facility] Group Notes:  (Nursing/MHT/Case Management/Adjunct)  Date:  06/21/2016  Time:  11:16 PM  Type of Therapy:  Group Therapy  Participation Level:  Active  Participation Quality:  Appropriate  Affect:  Appropriate  Cognitive:  Alert  Insight:  Good  Engagement in Group:  Engaged  Modes of Intervention:  Support  Summary of Progress/Problems:  [name] 06/21/2016, 11:16 PM

## 2016-06-21 NOTE — Progress Notes (Signed)
Patient was observed talking with peers and spending time in the dayroom this evening.  He discusses mood and talks freely about reason for this admission.  He contracts for safety.

## 2016-06-21 NOTE — BHH Suicide Risk Assessment (Signed)
[facility] Admission Suicide Risk Assessment   Nursing information obtained from:    Demographic factors:   patient is an older white male with a history of depression and past suicide attempts. Recently active alcohol abuse. Problems with family estrangement. On the plus side he does have good outpatient psychiatric treatment. Current Mental Status:   adequately groomed. Cooperative. Not psychotic. Blunt affect depressed mood passive suicidal thoughts but no intention to act on it. Not homicidal hallucinating or noncooperative. Loss Factors:   patient's wife has late stage cancer. Patient also continues to mourn his loss of function since having strokes. Historical Factors:   several years of major depression without response to medicine. History of prior suicide attempts. Risk Reduction Factors:   does have some family support, has good outpatient treatment and is cooperative with current treatment plan.  Total Time spent with patient: 1 hour Principal Problem: <principal problem not specified> Diagnosis:   Patient Active Problem List   Diagnosis Date Noted  . Severe recurrent major depression without psychotic features ([place]) [F33.2] 06/20/2016  . Severe episode of recurrent major depressive disorder, without psychotic features ([place]) [F33.2]   . Alcohol-induced mood disorder ([place]) [F10.94] 06/10/2016  . Intentional overdose of beta-adrenergic blocking drug ([place]) [T44.7X2A] 06/07/2016  . Suicide attempt ([place]) [T14.91XA] 06/07/2016  . Overdose, intentional self-harm, initial encounter ([place]) [T50.902A] 06/07/2016  . Confusion [R41.0] 03/02/2015  . Fall [W19.XXXA] 03/02/2015  . Alcohol abuse [F10.10]   . Frequent falls [R29.6]   . Acute ischemic stroke ([place]) [I63.9] 12/17/2014  . CVA (cerebral infarction) [I63.9] 12/17/2014  . Impotence [N52.9] 01/24/2013  . Acute, but ill-defined, cerebrovascular disease [I67.89] 09/20/2012  . Anxiety state [F41.1] 09/20/2012  . Other diseases of lung, not  elsewhere classified [J98.4] 09/20/2012  . Unspecified transient cerebral ischemia [G45.9] 09/20/2012  . Hypotension [I95.9] 08/26/2012  . Bradycardia [R00.1] 08/26/2012  . Altered mental status [R41.82] 07/19/2011  . PATENT FORAMEN OVALE [Q21.1] 06/12/2008  . DYSLIPIDEMIA [E78.5] 06/07/2008  . OBESITY [E66.9] 06/07/2008  . OBSESSIVE-COMPULSIVE DISORDER [F42.9] 06/07/2008  . ERECTILE DYSFUNCTION [F52.8] 06/07/2008  . PTSD [F43.10] 06/07/2008  . DEPRESSION [F32.9] 06/07/2008  . MIGRAINE, CHRONIC [G43.909] 06/07/2008  . CARPAL TUNNEL SYNDROME [G56.00] 06/07/2008  . Essential hypertension [I10] 06/07/2008  . CVA [I63.50] 06/07/2008  . Cervical spondylosis with myelopathy [M47.12] 12/20/2007   Subjective Data: 66 year old man with a history of major depression recurrent transferred to our hospital for consideration of ECT treatment. Patient has passive suicidal thoughts without intention or plan. He is cooperative with treatment. He is realistic about treatment goals. No evidence of psychosis. Minimal cognitive impairment.  Continued Clinical Symptoms:  Alcohol Use Disorder Identification Test Final Score (AUDIT): 2 The "Alcohol Use Disorders Identification Test", Guidelines for Use in Primary Care, Second Edition.  World Pharmacologist [facility]). Score between 0-7:  no or low risk or alcohol related problems. Score between 8-15:  moderate risk of alcohol related problems. Score between 16-19:  high risk of alcohol related problems. Score 20 or above:  warrants further diagnostic evaluation for alcohol dependence and treatment.   CLINICAL FACTORS:   Depression:   Anhedonia Hopelessness Impulsivity Alcohol/Substance Abuse/Dependencies   Musculoskeletal: Strength & Muscle Tone: within normal limits Gait & Station: normal Patient leans: N/A  Psychiatric Specialty Exam: Physical Exam  Nursing note and vitals reviewed. Constitutional: He appears well-developed and well-nourished.   HENT:  Head: Normocephalic and atraumatic.  Eyes: Conjunctivae are normal. Pupils are equal, round, and reactive to light.  Neck: Normal range of motion.  Cardiovascular: Regular rhythm and normal heart sounds.   Respiratory: Effort normal. No respiratory distress.  GI: Soft.  Musculoskeletal: Normal range of motion.  Neurological: He is alert.  Skin: Skin is warm and dry.  Psychiatric: Judgment normal. His speech is delayed. He is slowed. Cognition and memory are normal. He exhibits a depressed mood. He expresses suicidal ideation. He expresses no suicidal plans.    Review of Systems  Constitutional: Negative.   HENT: Negative.   Eyes: Negative.   Respiratory: Negative.   Cardiovascular: Negative.   Gastrointestinal: Negative.   Musculoskeletal: Positive for joint pain and neck pain.  Skin: Negative.   Neurological: Negative.   Psychiatric/Behavioral: Positive for depression, memory loss and suicidal ideas. Negative for hallucinations and substance abuse. The patient is nervous/anxious and has insomnia.     Blood pressure (!) 142/89, pulse 63, temperature 97.9 F (36.6 C), resp. rate 18, height 6\' 2"  (1.88 m), weight 99.3 kg (219 lb), SpO2 99 %.Body mass index is 28.12 kg/m.  General Appearance: Casual  Eye Contact:  Fair  Speech:  Slow  Volume:  Decreased  Mood:  Depressed  Affect:  Constricted  Thought Process:  Goal Directed  Orientation:  Full (Time, Place, and Person)  Thought Content:  Logical  Suicidal Thoughts:  Yes.  without intent/plan  Homicidal Thoughts:  No  Memory:  Immediate;   Good Recent;   Fair Remote;   Fair  Judgement:  Fair  Insight:  Fair  Psychomotor Activity:  Decreased  Concentration:  Concentration: Fair  Recall:  [employer] of Knowledge:  Fair  Language:  Fair  Akathisia:  No  Handed:  Right  AIMS (if indicated):     Assets:  Communication Skills Desire for Improvement Financial  Resources/Insurance Housing Intimacy Resilience Social Support  ADL's:  Intact  Cognition:  WNL  Sleep:  Number of Hours: 6.45      COGNITIVE FEATURES THAT CONTRIBUTE TO RISK:  Thought constriction (tunnel vision)    SUICIDE RISK:   Moderate:  Frequent suicidal ideation with limited intensity, and duration, some specificity in terms of plans, no associated intent, good self-control, limited dysphoria/symptomatology, some risk factors present, and identifiable protective factors, including available and accessible social support.  PLAN OF CARE: Patient has been admitted to the hospital for ECT management of major depression. We have discussed some medication changes that are documented elsewhere. Patient is agreeable and we intend to start ECT on Monday. For now continue routine care with individual and group therapy and ongoing assessment  I certify that inpatient services furnished can reasonably be expected to improve the patient's condition.   Alethia Berthold, MD 06/21/2016, 1:46 PM

## 2016-06-21 NOTE — Plan of Care (Signed)
Problem: Safety: Goal: Ability to identify and utilize support systems that promote safety will improve Outcome: Progressing Patient engages with peers and watches television to distract from depression.

## 2016-06-21 NOTE — Progress Notes (Signed)
D: Affect pleasant on approach . Patient stated slept fair last night .Stated appetite is good and energy level  low. Stated concentration poor . Stated on Depression scale 7 , hopeless 7 and anxiety 6 .( low 0-10 high) Denies suicidal  homicidal ideations  .  No auditory hallucinations  No pain concerns . Appropriate ADL'S. Interacting with peers and staff. Informed  Of ECT process  A: Encourage patient participation with unit programming . Instruction  Given on  Medication , verbalize understanding. R: Voice no other concerns. Staff continue to monitor

## 2016-06-22 ENCOUNTER — Other Ambulatory Visit: Payer: Self-pay | Admitting: Psychiatry

## 2016-06-22 NOTE — Progress Notes (Signed)
Patient was calm this evening.  He states he is ready for ECT treatments to start and hopes "it will help".  We reviewed possible side effects and what to expect during ECT treatments.  Patient acknowledged understanding.
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
## 2016-06-22 NOTE — [facility] Group Notes (Signed)
[place] LCSW Group Therapy  06/22/2016 2:14 PM  Type of Therapy:  Group Therapy  Participation Level:  Patient did not attend group. CSW invited patient to group.   Summary of Progress/Problems: Coping Skills: Patients defined and discussed healthy coping skills. Patients identified healthy coping skills they would like to try during hospitalization and after discharge. CSW offered insight to varying coping skills that may have been new to patients such as practicing mindfulness.  Duran Ohern G. [place], [place] 06/22/2016, 2:15 PM

## 2016-06-22 NOTE — Progress Notes (Signed)
Pt pleasant and cooperative, up on unit today. Appropriately interacts with staff/peers. Reports good sleep last night without the help of sleep medication, good appetite, normal energy, poor concentration. Rates depression 6/10, anxiety 6/10, hopelessness 8/10 (low 0-10 high). Denies SI/HI/AVH, pain. Goal today is "stay calm prepare for treatments this week look to self for comfort" by "interact with people mediatation music." Pt did not attend group today, but he did join peers outside for a short time. Voices readiness for ECT tomorrow. Bright on approach. Medication compliant. Support and encouragement provided. Medications administered as ordered with education. Safety maintained with every 15 minute checks. Will continue to monitor.

## 2016-06-22 NOTE — Plan of Care (Signed)
Problem: Role Relationship: Goal: Ability to demonstrate positive changes in social behaviors and relationships will improve Outcome: Progressing Pt appropriately interacts on unit, brightens on approach. Calm, cooperative, pleasant. Encouragement provided.

## 2016-06-22 NOTE — Plan of Care (Signed)
Problem: Coping: Goal: Ability to cope will improve Outcome: Progressing Patient verbalizes questions about ECT and is receptive to information.

## 2016-06-23 ENCOUNTER — Inpatient Hospital Stay (HOSPITAL_COMMUNITY)
Admission: RE | Admit: 2016-06-23 | Discharge: 2016-06-23 | Disposition: A | Discharge status: Home / Self Care | Payer: Medicare Other | Source: Other Acute Inpatient Hospital | Attending: Psychiatry | Admitting: Psychiatry

## 2016-06-23 ENCOUNTER — Inpatient Hospital Stay: Payer: Medicare Other | Admitting: Certified Registered Nurse Anesthetist

## 2016-06-23 DIAGNOSIS — G4730 Sleep apnea, unspecified: Secondary | ICD-10-CM | POA: Diagnosis not present

## 2016-06-23 DIAGNOSIS — I2510 Atherosclerotic heart disease of native coronary artery without angina pectoris: Secondary | ICD-10-CM | POA: Diagnosis not present

## 2016-06-23 DIAGNOSIS — F332 Major depressive disorder, recurrent severe without psychotic features: Secondary | ICD-10-CM

## 2016-06-23 DIAGNOSIS — I10 Essential (primary) hypertension: Secondary | ICD-10-CM | POA: Diagnosis not present

## 2016-06-23 DIAGNOSIS — F339 Major depressive disorder, recurrent, unspecified: Secondary | ICD-10-CM | POA: Diagnosis not present

## 2016-06-23 LAB — GLUCOSE, CAPILLARY: Glucose-Capillary: 122 mg/dL — ABNORMAL HIGH (ref 65–99)

## 2016-06-23 MED ORDER — SODIUM CHLORIDE 0.9 % IV SOLN
500.0000 mL | Freq: Once | INTRAVENOUS | Status: AC
  Administered 2016-06-25: 12:00:00 via INTRAVENOUS

## 2016-06-23 MED ORDER — METHOHEXITAL SODIUM 100 MG/10ML IV SOSY
PREFILLED_SYRINGE | INTRAVENOUS | Status: DC | PRN
  Administered 2016-06-23: 80 mg via INTRAVENOUS

## 2016-06-23 MED ORDER — SODIUM CHLORIDE 0.9 % IV SOLN
500.0000 mL | Freq: Once | INTRAVENOUS | Status: AC
  Administered 2016-06-23: 500 mL via INTRAVENOUS

## 2016-06-23 MED ORDER — SUCCINYLCHOLINE CHLORIDE 20 MG/ML IJ SOLN
INTRAMUSCULAR | Status: DC | PRN
  Administered 2016-06-23: 120 mg via INTRAVENOUS

## 2016-06-23 NOTE — Tx Team (Signed)
Interdisciplinary Treatment and Diagnostic Plan Update  06/23/2016 Time of Session: 1535 Jose JOHNSEY MRN: [id number]  Principal Diagnosis: Severe episode of recurrent major depressive disorder, without psychotic features ([place])  Secondary Diagnoses: Principal Problem:   Severe episode of recurrent major depressive disorder, without psychotic features ([place]) Active Problems:   PTSD   Alcohol abuse   Severe recurrent major depression without psychotic features ([place])   Current Medications:  Current Facility-Administered Medications  Medication Dose Route Frequency Provider Last Rate Last Dose  . 0.9 %  sodium chloride infusion  500 mL Intravenous Once Clapacs, John T, MD      . acetaminophen (TYLENOL) tablet 650 mg  650 mg Oral Q6H PRN Clapacs, Madie Reno, MD   650 mg at 06/23/16 1431  . alum & mag hydroxide-simeth (MAALOX/MYLANTA) 200-200-20 MG/5ML suspension 30 mL  30 mL Oral Q4H PRN Clapacs, John T, MD      . clopidogrel (PLAVIX) tablet 75 mg  75 mg Oral Daily Clapacs, Madie Reno, MD   75 mg at 06/23/16 1240  . lamoTRIgine (LAMICTAL) tablet 50 mg  50 mg Oral BID Clapacs, Madie Reno, MD   50 mg at 06/22/16 1717  . magnesium hydroxide (MILK OF MAGNESIA) suspension 30 mL  30 mL Oral Daily PRN Clapacs, John T, MD      . mirtazapine (REMERON) tablet 7.5 mg  7.5 mg Oral QHS Clapacs, John T, MD   7.5 mg at 06/22/16 2058  . venlafaxine XR (EFFEXOR-XR) 24 hr capsule 150 mg  150 mg Oral Q breakfast Clapacs, John T, MD   150 mg at 06/23/16 1240   PTA Medications: Prescriptions Prior to Admission  Medication Sig Dispense Refill Last Dose  . acetaminophen (TYLENOL) 500 MG tablet Take 2 tablets (1,000 mg total) by mouth every 6 (six) hours as needed for mild pain or moderate pain. For pain 30 tablet 0   . alum & mag hydroxide-simeth (MAALOX/MYLANTA) 200-200-20 MG/5ML suspension Take 30 mLs by mouth every 4 (four) hours as needed for indigestion. 355 mL 0   . aspirin 325 MG EC tablet Take 1 tablet (325 mg  total) by mouth daily. For heart health     . clopidogrel (PLAVIX) 75 MG tablet Take 1 tablet (75 mg total) by mouth daily. For prevention of blood clots     . hydrOXYzine (ATARAX/VISTARIL) 25 MG tablet Take 1 tablet (25 mg total) by mouth 3 (three) times daily as needed for anxiety. 30 tablet 0   . lamoTRIgine (LAMICTAL) 100 MG tablet Take 1 tablet (100 mg total) by mouth 2 (two) times daily. For mood stabilization     . magnesium hydroxide (MILK OF MAGNESIA) 400 MG/5ML suspension Take 30 mLs by mouth daily as needed for mild constipation. 360 mL 0   . mirtazapine (REMERON) 7.5 MG tablet Take 1 tablet (7.5 mg total) by mouth at bedtime. For sleep     . Multiple Vitamin (MULTIVITAMIN WITH MINERALS) TABS tablet Take 1 tablet by mouth daily. Vitamin supplement     . Omega-3 Fatty Acids (FISH OIL) 1200 MG CAPS Take 1 capsule (1,200 mg total) by mouth daily. For high cholesterol     . thiamine (VITAMIN B-1) 100 MG tablet Take 1 tablet (100 mg total) by mouth daily. For low thiamine level     . venlafaxine XR (EFFEXOR-XR) 75 MG 24 hr capsule Take 3 capsules (225 mg total) by mouth daily. For depression       Patient Stressors: Health problems Loss of  comfort  Substance abuse  Patient Strengths: Ability for insight Communication skills General fund of knowledge Supportive family/friends  Treatment Modalities: Medication Management, Group therapy, Case management,  1 to 1 session with clinician, Psychoeducation, Recreational therapy.   Physician Treatment Plan for Primary Diagnosis: Severe episode of recurrent major depressive disorder, without psychotic features ([place]) Long Term Goal(s): Improvement in symptoms so as ready for discharge Improvement in symptoms so as ready for discharge   Short Term Goals: Ability to verbalize feelings will improve Ability to disclose and discuss suicidal ideas Ability to maintain clinical measurements within normal limits will improve Compliance with  prescribed medications will improve  Medication Management: Evaluate patient's response, side effects, and tolerance of medication regimen.  Therapeutic Interventions: 1 to 1 sessions, Unit Group sessions and Medication administration.  Evaluation of Outcomes: Progressing  Physician Treatment Plan for Secondary Diagnosis: Principal Problem:   Severe episode of recurrent major depressive disorder, without psychotic features ([place]) Active Problems:   PTSD   Alcohol abuse   Severe recurrent major depression without psychotic features ([place])  Long Term Goal(s): Improvement in symptoms so as ready for discharge Improvement in symptoms so as ready for discharge   Short Term Goals: Ability to verbalize feelings will improve Ability to disclose and discuss suicidal ideas Ability to maintain clinical measurements within normal limits will improve Compliance with prescribed medications will improve     Medication Management: Evaluate patient's response, side effects, and tolerance of medication regimen.  Therapeutic Interventions: 1 to 1 sessions, Unit Group sessions and Medication administration.  Evaluation of Outcomes: Progressing   RN Treatment Plan for Primary Diagnosis: Severe episode of recurrent major depressive disorder, without psychotic features ([place]) Long Term Goal(s): Knowledge of disease and therapeutic regimen to maintain health will improve  Short Term Goals: Ability to identify and develop effective coping behaviors will improve  Medication Management: RN will administer medications as ordered by provider, will assess and evaluate patient's response and provide education to patient for prescribed medication. RN will report any adverse and/or side effects to prescribing provider.  Therapeutic Interventions: 1 on 1 counseling sessions, Psychoeducation, Medication administration, Evaluate responses to treatment, Monitor vital signs and CBGs as ordered, Perform/monitor CIWA,  COWS, AIMS and Fall Risk screenings as ordered, Perform wound care treatments as ordered.  Evaluation of Outcomes: Progressing   LCSW Treatment Plan for Primary Diagnosis: Severe episode of recurrent major depressive disorder, without psychotic features ([place]) Long Term Goal(s): Safe transition to appropriate next level of care at discharge, Engage patient in therapeutic group addressing interpersonal concerns.  Short Term Goals: Engage patient in aftercare planning with referrals and resources and Increase skills for wellness and recovery  Therapeutic Interventions: Assess for all discharge needs, 1 to 1 time with Social worker, Explore available resources and support systems, Assess for adequacy in community support network, Educate family and significant other(s) on suicide prevention, Complete Psychosocial Assessment, Interpersonal group therapy.  Evaluation of Outcomes: Progressing   Progress in Treatment: Attending groups: Yes. Participating in groups: Yes. Taking medication as prescribed: Yes. Toleration medication: Yes. Family/Significant other contact made: Yes, individual(s) contacted:  wife Patient understands diagnosis: Yes. Discussing patient identified problems/goals with staff: Yes. Medical problems stabilized or resolved: Yes. Denies suicidal/homicidal ideation: Yes. Issues/concerns per patient self-inventory: No. Other: none  New problem(s) identified: No, Describe:  none  New Short Term/Long Term Goal(s):PT goal is to "get through ECT treatments and to feel more positive."  Discharge Plan or Barriers: CSW assessing for appropriate plan.  Reason for  Continuation of Hospitalization: Other; describe ECT treatments, depression  Estimated Length of Stay:5-7 days.  Attendees: Patient:[name] 06/23/2016 3:50 PM  Physician: Dr Weber Cooks 06/23/2016 3:50 PM  Nursing: Floyde Parkins, RN 06/23/2016 3:50 PM  RN Care Manager: 06/23/2016 3:50 PM  Social Worker: Lurline Idol,  LCSW 06/23/2016 3:50 PM  Recreational Therapist:  06/23/2016 3:50 PM  Other:  06/23/2016 3:50 PM  Other:  06/23/2016 3:50 PM  Other: 06/23/2016 3:50 PM    Scribe for Treatment Team: Joanne Chars, [place] 06/23/2016 3:50 PM

## 2016-06-23 NOTE — Progress Notes (Signed)
Recreation Therapy Notes  Date: 05.07.18 Time: 1:00 pm Location: Craft Room  Group Topic: Wellness  Goal Area(s) Addresses:  Patient will identify at least one item per dimension of health. Patient will examine areas they are deficient in.  Behavioral Response: Did not attend  Intervention: 6 Dimensions of Wellness  Activity: Patients were given a definition sheet defining each dimension of health and a worksheet with each dimension listed. Patients were instructed to write what they were currently doing in each dimension of health.  Education: LRT educated patients on ways they can improve each dimension of health.  Education Outcome: Patient did not attend group.  Clinical Observations/Feedback: Patient did not attend group.  Leonette Monarch, LRT/CTRS 06/23/2016 1:37 PM

## 2016-06-23 NOTE — Plan of Care (Signed)
Problem: Safety: Goal: Periods of time without injury will increase Outcome: Progressing No injury reported or observed   
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
## 2016-06-23 NOTE — Progress Notes (Signed)
Patient returned from ECT in no distress. Pt currently in dayroom eating lunch.

## 2016-06-23 NOTE — Progress Notes (Signed)
Pt presents with sad affect. Denies SI, HI, AVH, but endorses depression. Pt had ECT today. Able to answer orientation questions post treatment. Pt eating and taking meds with no complaints.  Encouragement and support offered. Safety checks maintained. Medications given as prescribed. Pt receptive and remains safe on unit with q 15 min checks.

## 2016-06-23 NOTE — [facility] Group Notes (Signed)
[facility] Group Notes:  (Nursing/MHT/Case Management/Adjunct)  Date:  06/23/2016  Time:  4:06 PM  Type of Therapy:  Psychoeducational Skills  Participation Level:  Did Not Attend    Drake Leach 06/23/2016, 4:06 PM
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
## 2016-06-23 NOTE — Progress Notes (Signed)
Recreation Therapy Notes  At approximately 2:05 pm, LRT attempted assessment. Patient was resting and reported he had ECT treatment today and preferred assessment to be done tomorrow.  Leonette Monarch, LRT/CTRS 06/23/2016 2:57 PM

## 2016-06-23 NOTE — Anesthesia Postprocedure Evaluation (Signed)
Anesthesia Post Note  Patient: [name]  Procedure(s) Performed: * No procedures listed *  Patient location during evaluation: PACU Anesthesia Type: General Level of consciousness: awake and alert Pain management: pain level controlled Vital Signs Assessment: post-procedure vital signs reviewed and stable Respiratory status: spontaneous breathing, nonlabored ventilation, respiratory function stable and patient connected to nasal cannula oxygen Cardiovascular status: blood pressure returned to baseline and stable Postop Assessment: no signs of nausea or vomiting Anesthetic complications: no     Last Vitals:  Vitals:   06/23/16 1202 06/23/16 1212  BP: (!) 130/116 (!) 141/92  Pulse: 80 68  Resp: 19 17  Temp:  36.4 C    Last Pain:  Vitals:   06/23/16 1212  TempSrc:   PainSc: 0-No pain                 Rita Prom S
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
## 2016-06-23 NOTE — Plan of Care (Signed)
Problem: Health Behavior/Discharge Planning: Goal: Compliance with therapeutic regimen will improve Outcome: Progressing Pt medication compliant

## 2016-06-23 NOTE — Procedures (Signed)
ECT SERVICES Physician's Interval Evaluation & Treatment Note  Patient Identification: HUDSON MAJKOWSKI MRN:  [id number] Date of Evaluation:  06/23/2016 TX #: 1  MADRS: 31 MMSE: 28  P.E. Findings:  Heart and lungs clear and normal vitals unremarkable  Psychiatric Interval Note:  Mood depressed anxious chronic passive suicidal thoughts  Subjective:  Patient is a 66 y.o. male seen for evaluation for Electroconvulsive Therapy. Anxious today  Treatment Summary:   [x]   Right Unilateral             []  Bilateral   % Energy : 0.3 ms 70%   Impedance: 570 ohms  Seizure Energy Index: 1903 V squared  Postictal Suppression Index: 69%  Seizure Concordance Index: 97%  Medications  Pre Shock: Brevital 80 mg succinylcholine 100 mg  Post Shock:    Seizure Duration: 38 seconds by EMG 58 seconds by EEG   Comments: Follow-up Wednesday Friday continuing index course   Lungs:  [x]   Clear to auscultation               []  Other:   Heart:    [x]   Regular rhythm             []  irregular rhythm    [x]   Previous H&P reviewed, patient examined and there are NO CHANGES                 []   Previous H&P reviewed, patient examined and there are changes noted.   Alethia Berthold, MD 5/7/201811:32 AM

## 2016-06-23 NOTE — Transfer of Care (Signed)
Immediate Anesthesia Transfer of Care Note  Patient: [name]  Procedure(s) Performed: * No procedures listed *  Patient Location: PACU  Anesthesia Type:General  Level of Consciousness: awake, alert  and patient cooperative  Airway & Oxygen Therapy: Patient Spontanous Breathing and Patient connected to face mask oxygen  Post-op Assessment: Report given to RN and Post -op Vital signs reviewed and stable  Post vital signs: Reviewed and stable  Last Vitals:  Vitals:   06/23/16 0944 06/23/16 1152  BP: (!) 140/96 (!) 166/104  Pulse: 74 87  Resp:  11  Temp: 36.6 C 36.4 C    Last Pain:  Vitals:   06/23/16 1152  TempSrc: Tympanic  PainSc: 0-No pain         Complications: No apparent anesthesia complications

## 2016-06-23 NOTE — Anesthesia Post-op Follow-up Note (Cosign Needed)
Anesthesia QCDR form completed.        

## 2016-06-24 ENCOUNTER — Other Ambulatory Visit: Payer: Self-pay | Admitting: Psychiatry

## 2016-06-24 NOTE — Plan of Care (Signed)
Problem: Medication: Goal: Compliance with prescribed medication regimen will improve Outcome: Progressing Compliant with medicatin  gven

## 2016-06-24 NOTE — Plan of Care (Signed)
Problem: Coping: Goal: Ability to verbalize frustrations and anger appropriately will improve Outcome: Progressing Patient verbalized frustration to staff.    

## 2016-06-24 NOTE — Progress Notes (Signed)
Recreation Therapy Notes  Date: 05.08.18 Time: 3:00 pm Location: Craft Room  Group Topic: Self-expression  Goal Area(s) Addresses:  Patient will be able to identify a color that represents each emotion. Patient will verbalize benefit of using art as a means of self-expression. Patient will verbalize one emotion experienced while participating in activity.  Behavioral Response: Attentive, Interactive  Intervention: The Colors Within Me  Activity: Patients were given a blank face worksheet and were instructed to pick a color for each emotion they felt and show on the worksheet how much of that emotion they felt.  Education: LRT educated patients on other forms of self-expression  Education Outcome: Acknowledges education/In group clarification offered  Clinical Observations/Feedback: Patient picked a color for each emotion and showed on the worksheet how much of that emotion he was feeling. Patient contributed to group discussion by stating what emotions he was feeling, how his emotions affect his treatment in the hospital, that his emotions are static and dynamic and why, how he sees his emotions changing once he is able to d/c, that it was helpful to see his emotion on paper and why, what he can do to maintain being happy, and what happens when he bottles up his emotions.  Leonette Monarch, LRT/CTRS 06/24/2016 3:38 PM

## 2016-06-24 NOTE — Progress Notes (Signed)
Recreation Therapy Notes  INPATIENT RECREATION THERAPY ASSESSMENT  Patient Details Name: NOLYN SWAB MRN: [id number] DOB: [birth year] Today's Date: 06/24/2016  Patient Stressors: Family, Work, Other (Comment) (Estranged from children; lost job due to having seizures; pain from strokes; post concussion syndrome from multiple TBIs, PTS, wife has cancer and has had difficulty with her chemotherapy treatment)  Coping Skills:   Arguments, Substance Abuse, Avoidance, Exercise, Art/Dance, Music, Sports, Other (Comment) (Meditation)  Personal Challenges: Anger, Communication, Concentration, Decision-Making, Expressing Yourself, Problem-Solving, Relationships, Self-Esteem/Confidence, Stress Management, Substance Abuse, Time Management  Leisure Interests (2+):  Individual - Other (Comment), Music - Listen (Gardening, woodwork)  Awareness of Community Resources:  Yes  Community Resources:  [employer], Other (Comment) Higher education careers adviser)  Current Use: No  If no, Barriers?: Other (Comment) (No desire to go)  Patient Strengths:  Neurosurgeon, honest  Patient Identified Areas of Improvement:  Does not know  Current Recreation Participation:  Training new dog  Patient Goal for Hospitalization:  Engineer, mining ECT treatment and go home  [place] of Residence:  [place] of Residence:  [place]   Current SI (including self-harm):  No  Current HI:  No  Consent to Intern Participation: N/A   Leonette Monarch, LRT/CTRS 06/24/2016, 1:59 PM

## 2016-06-24 NOTE — Progress Notes (Signed)
D: Pt denies SI/HI/AVH. Pt is pleasant and cooperative. Pt stated he feels better from doing ECT, except the headache. Patient rated the pain 5/10 and was medicated per standing order. Patient appears less anxious and he is interacting with peers and staff appropriately.  A: Pt was offered support and encouragement. Pt was given scheduled medications. Pt was encouraged to attend groups. Q 15 minute checks were done for safety.  R:Pt attends groups and interacts well with peers and staff. Pt is taking medication. Pt has no complaints.Pt receptive to treatment and safety maintained on unit.
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
## 2016-06-24 NOTE — Progress Notes (Signed)
D: Affect cheerful on approach, appetite good . Appropriate ADL'S  Gait normal.  Patient interacting  With peers and staff.  Attending unit  Programing. Voice of ECT procedure  From yesterday  Stated everything  Went well . Patient stated slept good last night Energy level  Is normal. Stated concentration is poor Stated on Depression scale  6, hopeless 4 and anxiety 7 .( low 0-10 high) Denies suicidal  homicidal ideations  .  No auditory hallucinations  Foot pain concerns writer approached  Patient  For medication  . Appropriate ADL'S. Interacting with peers and staff. Denies suicidal intent  A: Encourage patient participation with unit programming . Instruction  Given on  Medication , verbalize understanding. R: Voice no other concerns. Staff continue to monitor

## 2016-06-24 NOTE — [facility] Group Notes (Signed)
[place] Group Notes:  (Nursing/MHT/Case Management/Adjunct)  Date:  06/24/2016  Time:  6:19 AM  Type of Therapy:  Group Therapy  Participation Level:  Did Not Attend   Marylynn Pearson 06/24/2016, 6:19 AM

## 2016-06-24 NOTE — [facility] Group Notes (Signed)
[place] LCSW Group Therapy Note  Date/Time: 06/24/2016; 9:30 AM  Type of Therapy/Topic:  Group Therapy:  Feelings about Diagnosis  Participation Level:  Active   Mood: Pleasant and Appropriate    Description of Group:    This group will allow patients to explore their thoughts and feelings about diagnoses they have received. Patients will be guided to explore their level of understanding and acceptance of these diagnoses. Facilitator will encourage patients to process their thoughts and feelings about the reactions of others to their diagnosis, and will guide patients in identifying ways to discuss their diagnosis with significant others in their lives. This group will be process-oriented, with patients participating in exploration of their own experiences as well as giving and receiving support and challenge from other group members.   Therapeutic Goals: 1. Patient will demonstrate understanding of diagnosis as evidence by identifying two or more symptoms of the disorder:  2. Patient will be able to express two feelings regarding the diagnosis 3. Patient will demonstrate ability to communicate their needs through discussion and/or role plays  Summary of Patient Progress: Patient identified feelings around diagnosis and ways to cope with feelings. Patient shared feelings of nervousness, irritability, and panicky due to new treatment of ECT. Patient identified coping mechanisms such as relaxation techniques to help with journey to recovery. CSW provided support to patient.    Therapeutic Modalities:   Cognitive Behavioral Therapy Brief Therapy Feelings Identification   Glorious Peach, MSW, LCSW-A 06/24/2016, 10:14AM

## 2016-06-25 ENCOUNTER — Inpatient Hospital Stay: Payer: Medicare Other | Admitting: Certified Registered Nurse Anesthetist

## 2016-06-25 LAB — GLUCOSE, CAPILLARY: Glucose-Capillary: 106 mg/dL — ABNORMAL HIGH (ref 65–99)

## 2016-06-25 MED ORDER — SUCCINYLCHOLINE CHLORIDE 20 MG/ML IJ SOLN
INTRAMUSCULAR | Status: AC
  Filled 2016-06-25: qty 1

## 2016-06-25 MED ORDER — SUCCINYLCHOLINE CHLORIDE 20 MG/ML IJ SOLN
INTRAMUSCULAR | Status: DC | PRN
  Administered 2016-06-25: 120 mg via INTRAVENOUS

## 2016-06-25 MED ORDER — KETOROLAC TROMETHAMINE 30 MG/ML IJ SOLN
30.0000 mg | Freq: Once | INTRAMUSCULAR | Status: AC
  Administered 2016-06-25: 30 mg via INTRAVENOUS

## 2016-06-25 MED ORDER — LABETALOL HCL 5 MG/ML IV SOLN
INTRAVENOUS | Status: DC | PRN
  Administered 2016-06-25 (×2): 10 mg via INTRAVENOUS

## 2016-06-25 MED ORDER — SODIUM CHLORIDE 0.9 % IV SOLN
500.0000 mL | Freq: Once | INTRAVENOUS | Status: AC
  Administered 2016-06-25: 09:00:00 via INTRAVENOUS

## 2016-06-25 MED ORDER — KETOROLAC TROMETHAMINE 30 MG/ML IJ SOLN
INTRAMUSCULAR | Status: AC
  Filled 2016-06-25: qty 1

## 2016-06-25 MED ORDER — METHOHEXITAL SODIUM 100 MG/10ML IV SOSY
PREFILLED_SYRINGE | INTRAVENOUS | Status: DC | PRN
  Administered 2016-06-25: 80 mg via INTRAVENOUS

## 2016-06-25 MED ORDER — METHOHEXITAL SODIUM 0.5 G IJ SOLR
INTRAMUSCULAR | Status: AC
  Filled 2016-06-25: qty 500

## 2016-06-25 NOTE — Plan of Care (Signed)
Problem: Coping: Goal: Ability to verbalize frustrations and anger appropriately will improve Outcome: Progressing Patient verbalized frustration to staff.    

## 2016-06-25 NOTE — Anesthesia Postprocedure Evaluation (Signed)
Anesthesia Post Note  Patient: [name]  Procedure(s) Performed: * No procedures listed *  Patient location during evaluation: PACU Anesthesia Type: General Level of consciousness: awake and alert Pain management: pain level controlled Vital Signs Assessment: post-procedure vital signs reviewed and stable Respiratory status: spontaneous breathing, nonlabored ventilation, respiratory function stable and patient connected to nasal cannula oxygen Cardiovascular status: blood pressure returned to baseline and stable Postop Assessment: no signs of nausea or vomiting Anesthetic complications: no     Last Vitals:  Vitals:   06/25/16 1305 06/25/16 1308  BP: (!) 132/99 (!) 132/99  Pulse:  65  Resp:  20  Temp:  36.4 C    Last Pain:  Vitals:   06/25/16 1244  TempSrc: Temporal  PainSc: 0-No pain                 Martha Clan

## 2016-06-25 NOTE — Progress Notes (Signed)
D: Pt denies SI/HI/AVH. Pt is pleasant and cooperative, affect is brighter  Pt stated he feels better from doing ECT, he appears less anxious and he is interacting with peers and staff appropriately.  A: Pt was offered support and encouragement. Pt was given scheduled medications. Pt was encouraged to attend groups. Q 15 minute checks were done for safety.  R:Pt attends groups and interacts well with peers and staff. Pt is taking medication. Pt has no complaints.Pt receptive to treatment and safety maintained on unit.

## 2016-06-25 NOTE — Procedures (Signed)
ECT SERVICES Physician's Interval Evaluation & Treatment Note  Patient Identification: [name] MRN:  [id number] Date of Evaluation:  06/25/2016 TX #: 2  MADRS:   MMSE:   P.E. Findings:  No change physical exam vitals unremarkable heart and lungs normal  Psychiatric Interval Note:  Affect euthymic mood stable slightly improved no active suicidal thoughts.  Subjective:  Patient is a 66 y.o. male seen for evaluation for Electroconvulsive Therapy. No new complaint  Treatment Summary:   []   Right Unilateral             []  Bilateral   % Energy : 0.3 ms 70%   Impedance: 430 ohms  Seizure Energy Index: 3098 V squared  Postictal Suppression Index: 66%  Seizure Concordance Index: 93%  Medications  Pre Shock: Brevital 80 mg succinylcholine 100 mg  Post Shock:    Seizure Duration: 29 seconds by EMG 38 seconds by EEG   Comments: Follow-up on Friday   Lungs:  [x]   Clear to auscultation               []  Other:   Heart:    [x]   Regular rhythm             []  irregular rhythm    [x]   Previous H&P reviewed, patient examined and there are NO CHANGES                 []   Previous H&P reviewed, patient examined and there are changes noted.   Alethia Berthold, MD 5/9/201812:24 PM

## 2016-06-25 NOTE — Progress Notes (Signed)
Patient escorted to and from ECT without any concerns  And returned . Affect cheerful on approach.Patient stated slept good last night .Stated appetite is good and energy level  Is normal. Stated concentration is good . Stated on Depression scale 5 hopeless 4 and anxiety 6 .( low 0-10 high) Denies suicidal  homicidal ideations  .  No auditory hallucinations  No pain concerns . Appropriate ADL'S. Interacting with peers and staff.  A: Encourage patient participation with unit programming . Instruction  Given on  Medication , verbalize understanding. R: Voice no other concerns. Staff continue to monitor
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
## 2016-06-25 NOTE — [facility] Group Notes (Signed)
[place] Group Notes:  (Nursing/MHT/Case Management/Adjunct)  Date:  06/25/2016  Time:  4:10 AM  Type of Therapy:  Psychoeducational Skills  Participation Level:  Did Not Attend   Summary of Progress/Problems:  [name] 06/25/2016, 4:10 AM

## 2016-06-25 NOTE — Anesthesia Post-op Follow-up Note (Cosign Needed)
Anesthesia QCDR form completed.        

## 2016-06-25 NOTE — Progress Notes (Signed)
Recreation Therapy Notes  Date: 05.09.18 Time: 1:00 pm Location: Craft Room  Group Topic: Self-esteem  Goal Area(s) Addresses:  Patient will write at least one positive trait about self. Patient will verbalize benefit of having a healthy self-esteem.  Behavioral Response: Did not attend  Intervention: I Am  Activity: Patients were given a worksheet with the letter I on it and were instructed to write as many positive traits about themselves inside the letter.  Education: LRT educated patients on ways to increase their self-esteem.  Education Outcome: Patient did not attend group.   Clinical Observations/Feedback: Patient did not attend group.  Leonette Monarch, LRT/CTRS 06/25/2016 1:44 PM
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
## 2016-06-25 NOTE — Anesthesia Procedure Notes (Addendum)
Date/Time: 06/25/2016 12:34 PM Performed by: Darlyne Russian Pre-anesthesia Checklist: Patient identified, Emergency Drugs available, Suction available, Patient being monitored and Timeout performed Patient Re-evaluated:Patient Re-evaluated prior to inductionOxygen Delivery Method: Circle system utilized Preoxygenation: Pre-oxygenation with 100% oxygen Intubation Type: IV induction Ventilation: Mask ventilation without difficulty and Oral airway inserted - appropriate to patient size Airway Equipment and Method: Bite block Dental Injury: Teeth and Oropharynx as per pre-operative assessment

## 2016-06-25 NOTE — Transfer of Care (Signed)
Immediate Anesthesia Transfer of Care Note  Patient: [name]  Procedure(s) Performed: * No procedures listed *  Patient Location: PACU  Anesthesia Type:General  Level of Consciousness: awake, alert  and patient cooperative  Airway & Oxygen Therapy: Patient Spontanous Breathing and Patient connected to face mask oxygen  Post-op Assessment: Report given to RN and Post -op Vital signs reviewed and stable  Post vital signs: Reviewed and stable  Last Vitals:  Vitals:   06/25/16 1244 06/25/16 1249  BP: (!) 165/116 (!) 161/131  Pulse: 67   Resp: 20   Temp: 36.4 C     Last Pain:  Vitals:   06/25/16 1244  TempSrc: Temporal  PainSc: 0-No pain      Patients Stated Pain Goal: 0 (45/62/56 3893)  Complications: No apparent anesthesia complications

## 2016-06-25 NOTE — Progress Notes (Signed)
On 06/24/16 around 1615, CSW received call from Hildred Alamin, wife of pt, with concerns as pt had told her that CSW had discussed sending pt home very quickly.  CSW explained that we had a treatment team yesterday where Dr Weber Cooks discussed that not all ECT treatments need to occur while pt is on the inpatient unit but that there was not any discussion of pt going home immediately.  Wife reports that she would be very concerned with a quick discharge due to pt suicidality and she feels better knowing that is not the plan. Winferd Humphrey, MSW, LCSW Clinical Social Worker 06/25/2016 8:22 AM

## 2016-06-25 NOTE — Plan of Care (Signed)
Problem: Activity: Goal: Interest or engagement in activities will improve Outcome: Progressing Attending unit programing

## 2016-06-26 ENCOUNTER — Other Ambulatory Visit: Payer: Self-pay | Admitting: Psychiatry

## 2016-06-26 NOTE — Plan of Care (Signed)
Problem: Education: Goal: Mental status will improve Outcome: Progressing Patient reports feeling better today.

## 2016-06-26 NOTE — B[facility] Group Notes (Signed)
[place] LCSW Group Therapy  06/26/2016 11:05 AM  Type of Therapy:  Group Therapy  Participation Level:  Active  Participation Quality:  Attentive and Sharing  Affect:  Appropriate  Cognitive:  Alert  Insight:  Developing/Improving  Engagement in Therapy:  Developing/Improving  Modes of Intervention:  Activity, Clarification, Discussion, Education, Exploration, Problem-solving, Reality Testing, Socialization and Support  Summary of Progress/Problems: Balance in life: Patients will discuss the concept of balance and how it looks and feels to be unbalanced. Pt will identify areas in their life that is unbalanced and ways to become more balanced. They discussed what aspects in their lives has influenced their self care. Patients also discussed self care in the areas of self regulation/control, hygiene/appearance, sleep/relaxation, healthy leisure, healthy eating habits, exercise, inner peace/spirituality, self improvement, sobriety, and health management. They were challenged to identify changes that are needed in order to improve self care. Patient shared he needs to make improvements in areas of communicating with his support system and health management. CSW provided support to patient and how to find balance in his lifestyle.    Lexx [place]. [place], [place] 06/26/2016, 11:05 AM

## 2016-06-26 NOTE — Plan of Care (Signed)
Problem: [facility] Participation in Recreation Therapeutic Interventions Goal: STG-Patient will identify at least five coping skills for ** STG: Coping Skills - Within 4 treatment sessions, patient will verbalize at least 5 coping skills for substance abuse in each of 2 treatment sessions to decrease substance abuse.  Outcome: Progressing Treatment Session 1; Completed 1 out of 2: At approximately 10:35 am, LRT met with patient in patient room. Patient verbalized 5 coping skills for substance abuse. LRT educated patient on leisure and why it is important to implement it into his schedule. LRT educated and provided patient with blank schedules to help him plan his day and try to avoid using substances. LRT educated patient on healthy support systems.  Leonette Monarch, LRT/CTRS 05.10.18 3:11 pm Goal: STG-Other Recreation Therapy Goal (Specify) STG: Stress Management - Within 4 treatment sessions, patient will verbalize understanding of the stress management techniques in each of 2 treatment sessions to increase stress management skills.  Outcome: Progressing Treatment Session 1; Completed 1 out of 2: At approximately 10:35 am, LRT met with patient in patient room. LRT educated and provided patient with handouts on stress management techniques. Patient verbalized understanding. LRT encouraged patient to read over and practice the stress management techniques.  Leonette Monarch, LRT/CTRS 05.10.18 3:13 pm

## 2016-06-26 NOTE — B[facility] Group Notes (Signed)
[place] Group Notes:  (Nursing/MHT/Case Management/Adjunct)  Date:  06/26/2016  Time:  9:41 PM  Type of Therapy:  Group Therapy  Participation Level:  Active  Participation Quality:  Appropriate  Affect:  Appropriate  Cognitive:  Appropriate  Insight:  Good  Engagement in Group:  Engaged  Modes of Intervention:  Support  Summary of Progress/Problems:  [name] 06/26/2016, 9:41 PM
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
## 2016-06-26 NOTE — BHH Suicide Risk Assessment (Signed)
[facility] Discharge Suicide Risk Assessment   Principal Problem: Severe episode of recurrent major depressive disorder, without psychotic features [facility]) Discharge Diagnoses:  Patient Active Problem List   Diagnosis Date Noted  . Severe recurrent major depression without psychotic features ([place]) [F33.2] 06/20/2016  . Severe episode of recurrent major depressive disorder, without psychotic features ([place]) [F33.2]   . Alcohol-induced mood disorder ([place]) [F10.94] 06/10/2016  . Intentional overdose of beta-adrenergic blocking drug ([place]) [T44.7X2A] 06/07/2016  . Suicide attempt ([place]) [T14.91XA] 06/07/2016  . Overdose, intentional self-harm, initial encounter ([place]) [T50.902A] 06/07/2016  . Confusion [R41.0] 03/02/2015  . Fall [W19.XXXA] 03/02/2015  . Alcohol abuse [F10.10]   . Frequent falls [R29.6]   . Acute ischemic stroke ([place]) [I63.9] 12/17/2014  . CVA (cerebral infarction) [I63.9] 12/17/2014  . Impotence [N52.9] 01/24/2013  . Acute, but ill-defined, cerebrovascular disease [I67.89] 09/20/2012  . Anxiety state [F41.1] 09/20/2012  . Other diseases of lung, not elsewhere classified [J98.4] 09/20/2012  . Unspecified transient cerebral ischemia [G45.9] 09/20/2012  . Hypotension [I95.9] 08/26/2012  . Bradycardia [R00.1] 08/26/2012  . Altered mental status [R41.82] 07/19/2011  . PATENT FORAMEN OVALE [Q21.1] 06/12/2008  . DYSLIPIDEMIA [E78.5] 06/07/2008  . OBESITY [E66.9] 06/07/2008  . OBSESSIVE-COMPULSIVE DISORDER [F42.9] 06/07/2008  . ERECTILE DYSFUNCTION [F52.8] 06/07/2008  . PTSD [F43.10] 06/07/2008  . DEPRESSION [F32.9] 06/07/2008  . MIGRAINE, CHRONIC [G43.909] 06/07/2008  . CARPAL TUNNEL SYNDROME [G56.00] 06/07/2008  . Essential hypertension [I10] 06/07/2008  . CVA [I63.50] 06/07/2008  . Cervical spondylosis with myelopathy [M47.12] 12/20/2007    Total Time spent with patient: 30 minutes  Musculoskeletal: Strength & Muscle Tone: within normal limits Gait & Station: normal Patient  leans: N/A  Psychiatric Specialty Exam: ROS  Blood pressure (!) 152/79, pulse 72, temperature 97.9 F (36.6 C), resp. rate 18, height 6\' 2"  (1.88 m), weight 107.5 kg (237 lb), SpO2 96 %.Body mass index is 30.43 kg/m.  General Appearance: Casual  Eye Contact::  Good  Speech:  Normal Rate409  Volume:  Normal  Mood:  Euthymic  Affect:  Congruent  Thought Process:  Goal Directed  Orientation:  Full (Time, Place, and Person)  Thought Content:  Logical  Suicidal Thoughts:  No  Homicidal Thoughts:  No  Memory:  Immediate;   Good Recent;   Fair Remote;   Fair  Judgement:  Fair  Insight:  Fair  Psychomotor Activity:  Normal  Concentration:  Fair  Recall:  [employer] [place]  Language: Fair  Akathisia:  No  Handed:  Right  AIMS (if indicated):     Assets:  Communication Skills Desire for Improvement Housing Resilience Social Support  Sleep:  Number of Hours: 8.75  Cognition: Impaired,  Mild  ADL's:  Intact   Mental Status Per Nursing Assessment::   On Admission:     Demographic Factors:  Male, Age 66 or older, Caucasian and Unemployed  Loss Factors: Decline in physical health  Historical Factors: Prior suicide attempts  Risk Reduction Factors:   Sense of responsibility to family, Living with another person, especially a relative, Positive social support and Positive therapeutic relationship  Continued Clinical Symptoms:  Depression:   Insomnia  Cognitive Features That Contribute To Risk:  Loss of executive function    Suicide Risk:  Mild:  Suicidal ideation of limited frequency, intensity, duration, and specificity.  There are no identifiable plans, no associated intent, mild dysphoria and related symptoms, good self-control (both objective and subjective assessment), few other risk factors, and identifiable protective factors, including available and accessible  social support.  Follow-up Information    [facility]. Go on 07/07/2016.   Why:  Please  attend your follow up appointment at [facility] with Dr. Ralph Leyden on 07/07/16 at 1:30pm.  Please bring a copy of your hospital discharge paperwork. Contact information: [place], [place] [address] P: [phone] F: [place]. Go on 07/17/2016.   Why:  Please attend your counseling appointment with Marlowe Kays on 07/17/16 at 2:00pm.  Please bring your hospital discharge paperwork. Contact information: [place] [address] P: [phone] F: [phone]          Plan Of Care/Follow-up recommendations:  Activity:  Activity as tolerated Diet:  Heart healthy diet Other:  Patient will have outpatient treatment with his regular psychiatrist and will continue ECT course as an outpatient Monday Wednesday and Friday and is agreeable to the plan as is his wife  Alethia Berthold, MD 06/26/2016, 7:30 PM

## 2016-06-26 NOTE — Progress Notes (Signed)
Recreation Therapy Notes  Date: 05.10.18 Time: 1:00 pm Location: Craft Room  Group Topic: Leisure Education  Goal Area(s) Addresses:  Patient will identify activities for each letter of the alphabet. Patient will verbalize ability to integrate positive leisure into life post d/c. Patient will verbalize ability to use leisure as a Technical sales engineer.  Behavioral Response: Attentive, Interactive  Intervention: Leisure Alphabet  Activity: Patients were given a Leisure Air traffic controller and were instructed to write healthy leisure activities for each letter of the alphabet.  Education: LRT educated patients on what they need to participate in leisure.  Education Outcome: Acknowledges education/In group clarification offered  Clinical Observations/Feedback: Patient wrote healthy leisure activities. Patient contributed to group discussion by stating healthy leisure activities, how he can integrate leisure into his schedule, and what makes leisure a good coping skill.  Leonette Monarch, LRT/CTRS 06/26/2016 1:54 PM

## 2016-06-26 NOTE — BHH Group Notes (Signed)
[facility] Group Notes:  (Nursing/MHT/Case Management/Adjunct)  Date:  06/26/2016  Time:  6:01 PM  Type of Therapy:  Psychoeducational Skills  Participation Level:  Active  Participation Quality:  Appropriate, Attentive and Sharing  Affect:  Appropriate  Cognitive:  Alert and Appropriate  Insight:  Appropriate  Engagement in Group:  Engaged  Modes of Intervention:  Discussion, Education and Support  Summary of Progress/Problems:  [name] 06/26/2016, 6:01 PM

## 2016-06-26 NOTE — Progress Notes (Signed)
Patient pleasant and cooperative. Appears brighter. Reports feeling better. Denies SI, HI, AVH. Continues to endorse depression. Reports ECT treatments are helping.  Encouragement and support offered. Safety checks maintained. Pt receptive and remains safe on unit with q 15 min checks.
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
## 2016-06-26 NOTE — BHH Group Notes (Signed)
[place] LCSW Group Therapy Note  Type of Therapy and Topic:  Group Therapy:  Goals Group: SMART Goals  Participation Level:  Patient attended group on this date. Patient participated in goal setting and was able to share openly with the group.   Description of Group:   The purpose of a daily goals group is to assist and guide patients in setting recovery/wellness-related goals.  The objective is to set goals as they relate to the crisis in which they were admitted. Patients will be using SMART goal modalities to set measurable goals.  Characteristics of realistic goals will be discussed and patients will be assisted in setting and processing how one will reach their goal. Facilitator will also assist patients in applying interventions and coping skills learned in psycho-education groups to the SMART goal and process how one will achieve defined goal.  Therapeutic Goals: -Patients will develop and document one goal related to or their crisis in which brought them into treatment. -Patients will be guided by LCSW using SMART goal setting modality in how to set a measurable, attainable, realistic and time sensitive goal.  -Patients will process barriers in reaching goal. -Patients will process interventions in how to overcome and successful in reaching goal.   Summary of Patient Progress:  Patient Goal: "I want to develop new coping skills".    Therapeutic Modalities:   Motivational Interviewing Public relations account executive Therapy Crisis Intervention Model SMART goals setting  Toy Eisemann G. Claybon Jabs MSW, [place] 06/26/2016 10:25 AM

## 2016-06-26 NOTE — Progress Notes (Signed)
D: Pt denies SI/HI/AVH. Pt is pleasant and cooperative, affect is brighter. Patient rated depression a 4/10, he also stated he feels better from doing ECT, he appears less anxious and he is interacting with peers and staff appropriately.  A: Pt was offered support and encouragement. Pt was given scheduled medications. Pt was encouraged to attend groups. Q 15 minute checks were done for safety.  R:Pt attends groups and interacts well with peers and staff. Pt is taking medication. Pt has no complaints.Pt receptive to treatment and safety maintained on unit.

## 2016-06-27 ENCOUNTER — Inpatient Hospital Stay: Payer: Medicare Other | Admitting: Anesthesiology

## 2016-06-27 DIAGNOSIS — I2510 Atherosclerotic heart disease of native coronary artery without angina pectoris: Secondary | ICD-10-CM | POA: Diagnosis not present

## 2016-06-27 DIAGNOSIS — I10 Essential (primary) hypertension: Secondary | ICD-10-CM | POA: Diagnosis not present

## 2016-06-27 DIAGNOSIS — F339 Major depressive disorder, recurrent, unspecified: Secondary | ICD-10-CM | POA: Diagnosis not present

## 2016-06-27 LAB — GLUCOSE, CAPILLARY: Glucose-Capillary: 96 mg/dL (ref 65–99)

## 2016-06-27 MED ORDER — LAMOTRIGINE 25 MG PO TABS: 50.0000 mg | ORAL_TABLET | Freq: Two times a day (BID) | ORAL | 0 refills | Status: DC

## 2016-06-27 MED ORDER — LABETALOL HCL 5 MG/ML IV SOLN
INTRAVENOUS | Status: DC | PRN
  Administered 2016-06-27 (×2): 10 mg via INTRAVENOUS

## 2016-06-27 MED ORDER — MIDAZOLAM HCL 2 MG/2ML IJ SOLN
INTRAMUSCULAR | Status: DC | PRN
  Administered 2016-06-27: 2 mg via INTRAVENOUS

## 2016-06-27 MED ORDER — METHOHEXITAL SODIUM 100 MG/10ML IV SOSY
PREFILLED_SYRINGE | INTRAVENOUS | Status: DC | PRN
  Administered 2016-06-27: 80 mg via INTRAVENOUS

## 2016-06-27 MED ORDER — MIDAZOLAM HCL 2 MG/2ML IJ SOLN
INTRAMUSCULAR | Status: AC
  Filled 2016-06-27: qty 2

## 2016-06-27 MED ORDER — SODIUM CHLORIDE 0.9 % IV SOLN
INTRAVENOUS | Status: DC | PRN
  Administered 2016-06-27: 12:00:00 via INTRAVENOUS

## 2016-06-27 MED ORDER — PROPRANOLOL HCL 20 MG PO TABS: 20.0000 mg | ORAL_TABLET | Freq: Two times a day (BID) | ORAL | 0 refills | Status: DC

## 2016-06-27 MED ORDER — VENLAFAXINE HCL ER 150 MG PO CP24: 150.0000 mg | ORAL_CAPSULE | Freq: Every day | ORAL | 0 refills | Status: DC

## 2016-06-27 MED ORDER — MIRTAZAPINE 7.5 MG PO TABS: 7.5000 mg | ORAL_TABLET | Freq: Every day | ORAL | 0 refills | Status: DC

## 2016-06-27 MED ORDER — LISINOPRIL 40 MG PO TABS: 40.0000 mg | ORAL_TABLET | Freq: Every day | ORAL | 0 refills | Status: DC

## 2016-06-27 MED ORDER — SUCCINYLCHOLINE CHLORIDE 200 MG/10ML IV SOSY
PREFILLED_SYRINGE | INTRAVENOUS | Status: DC | PRN
  Administered 2016-06-27: 120 mg via INTRAVENOUS

## 2016-06-27 MED ORDER — GABAPENTIN 300 MG PO CAPS: 600.0000 mg | ORAL_CAPSULE | Freq: Every day | ORAL | 0 refills | Status: DC

## 2016-06-27 MED ORDER — KETOROLAC TROMETHAMINE 30 MG/ML IJ SOLN: 30.0000 mg | Freq: Once | INTRAMUSCULAR | Status: DC

## 2016-06-27 MED ORDER — SODIUM CHLORIDE 0.9 % IV SOLN
500.0000 mL | Freq: Once | INTRAVENOUS | Status: AC
  Administered 2016-06-27: 10:00:00 via INTRAVENOUS

## 2016-06-27 MED ORDER — CLOPIDOGREL BISULFATE 75 MG PO TABS: 75.0000 mg | ORAL_TABLET | Freq: Every day | ORAL | 0 refills | Status: DC

## 2016-06-27 MED ORDER — SUCCINYLCHOLINE CHLORIDE 20 MG/ML IJ SOLN
INTRAMUSCULAR | Status: AC
  Filled 2016-06-27: qty 1

## 2016-06-27 MED ORDER — LABETALOL HCL 5 MG/ML IV SOLN
INTRAVENOUS | Status: AC
  Filled 2016-06-27: qty 4
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
## 2016-06-27 NOTE — Progress Notes (Signed)
Patient denies SI/HI, denies A/V hallucinations. Patient verbalizes understanding of discharge instructions, follow up care and prescriptions. Patient given all belongings from [facility] locker. Patient escorted out by staff, transported by family. 

## 2016-06-27 NOTE — B[facility] Group Notes (Signed)
[place] LCSW Group Therapy  06/27/2016 10:23 AM  Type of Therapy:  Group Therapy  Participation Level:  Patient did not attend group on this date. Patient was receiving ECT treatment during group on this date.   Summary of Progress/Problems: Stress management: Patients defined and discussed the topic of stress and the related symptoms and triggers for stress. Patients identified healthy coping skills they would like to try during hospitalization and after discharge to manage stress in a healthy way. CSW offered insight to varying stress management techniques.   Rakia Frayne G. Claybon Jabs MSW, [place] 06/27/2016, 10:24 AM

## 2016-06-27 NOTE — Progress Notes (Signed)
Returned to [employer] via wheelchair.

## 2016-06-27 NOTE — Transfer of Care (Signed)
Immediate Anesthesia Transfer of Care Note  Patient: [name]  Procedure(s) Performed: ECT  Patient Location: PACU  Anesthesia Type:General  Level of Consciousness: sedated  Airway & Oxygen Therapy: Patient Spontanous Breathing and Patient connected to face mask oxygen  Post-op Assessment: Report given to RN and Post -op Vital signs reviewed and stable  Post vital signs: Reviewed and stable  Last Vitals:  Vitals:   06/27/16 1013 06/27/16 1210  BP: 138/77 (!) 148/106  Pulse: (!) 52 70  Resp: 16 20  Temp: 37 C 37 C    Last Pain:  Vitals:   06/27/16 1210  TempSrc:   PainSc: Asleep      Patients Stated Pain Goal: 0 (59/93/57 0177)  Complications: No apparent anesthesia complications

## 2016-06-27 NOTE — Progress Notes (Signed)
Recreation Therapy Notes  INPATIENT RECREATION TR PLAN  Patient Details Name: [name] MRN: [id number] DOB: Mar 04, 1950 Today's Date: 06/27/2016  Rec Therapy Plan Is patient appropriate for Therapeutic Recreation?: Yes Treatment times per week: At least once a week TR Treatment/Interventions: 1:1 session, Group participation (Comment) (Appropriate participation in daily recreational therapy tx)  Discharge Criteria Pt will be discharged from therapy if:: Treatment goals are met, Discharged Treatment plan/goals/alternatives discussed and agreed upon by:: Patient/family  Discharge Summary Short term goals set: See Care Plan Short term goals met: Adequate for discharge Progress toward goals comments: One-to-one attended Which groups?: Leisure education One-to-one attended: Self-esteem, stress management Reason goals not met: Patient d/c before goal could be met Therapeutic equipment acquired: None Reason patient discharged from therapy: Discharge from hospital Pt/family agrees with progress & goals achieved: Yes Date patient discharged from therapy: 06/27/16   Leonette Monarch, LRT/CTRS 06/27/2016, 3:49 PM

## 2016-06-27 NOTE — Anesthesia Post-op Follow-up Note (Cosign Needed)
Anesthesia QCDR form completed.        
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
## 2016-06-27 NOTE — Anesthesia Postprocedure Evaluation (Signed)
Anesthesia Post Note  Patient: [name]  Procedure(s) Performed: * No procedures listed *  Patient location during evaluation: PACU Anesthesia Type: General Level of consciousness: awake and alert Pain management: pain level controlled Vital Signs Assessment: post-procedure vital signs reviewed and stable Respiratory status: spontaneous breathing, nonlabored ventilation, respiratory function stable and patient connected to nasal cannula oxygen Cardiovascular status: blood pressure returned to baseline and stable Postop Assessment: no signs of nausea or vomiting Anesthetic complications: no     Last Vitals:  Vitals:   06/27/16 1230 06/27/16 1240  BP: (!) 141/84 137/84  Pulse: 66 76  Resp: 20 20  Temp:      Last Pain:  Vitals:   06/27/16 1240  TempSrc:   PainSc: 0-No pain                 Martha Clan

## 2016-06-27 NOTE — Procedures (Signed)
ECT SERVICES Physician's Interval Evaluation & Treatment Note  Patient Identification: [name] MRN:  [id number] Date of Evaluation:  06/27/2016 TX #: 3  MADRS:   MMSE:   P.E. Findings:  No change to physical exam. Heart and lungs clear. Vitals unremarkable  Psychiatric Interval Note:  Mood is stated as improved affect euthymic a little calmer.  Subjective:  Patient is a 66 y.o. male seen for evaluation for Electroconvulsive Therapy. Significant new complaint  Treatment Summary:   [x]   Right Unilateral             []  Bilateral   % Energy : 0.3 ms 70%   Impedance: 620 ohms  Seizure Energy Index: 1362 V squared  Postictal Suppression Index: 45%  Seizure Concordance Index: 97%  Medications  Pre Shock: Brevital 80 mg succinylcholine 100 mg  Post Shock:    Seizure Duration: 24 seconds by EMG 33 seconds by EEG   Comments: Being discharged today and will follow-up as an outpatient Monday   Lungs:  [x]   Clear to auscultation               []  Other:   Heart:    [x]   Regular rhythm             []  irregular rhythm    [x]   Previous H&P reviewed, patient examined and there are NO CHANGES                 []   Previous H&P reviewed, patient examined and there are changes noted.   Alethia Berthold, MD 5/11/201811:49 AM
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
## 2016-06-27 NOTE — Progress Notes (Signed)
  [facility] Adult Case Management Discharge Plan :  Will you be returning to the same living situation after discharge:  Yes,  with wife At discharge, do you have transportation home?: Yes,  wife Do you have the ability to pay for your medications: Yes,  medicare  Release of information consent forms completed and in the chart;  Patient's signature needed at discharge.  Patient to Follow up at: Follow-up Information    [facility]. Go on 07/07/2016.   Why:  Please attend your follow up appointment at [facility] with Dr. Ralph Leyden on 07/07/16 at 1:30pm.  Please bring a copy of your hospital discharge paperwork. Contact information: [place], [place] [address] P: [phone] F: [place]. Go on 07/17/2016.   Why:  Please attend your counseling appointment with Marlowe Kays on 07/17/16 at 2:00pm.  Please bring your hospital discharge paperwork. Contact information: [place] [address] P: [phone] F: [phone]       [facility]. Go on 06/30/2016.   Why:  Please attend your ECT appointment with Dr. Weber Cooks on Monday, 06/30/16 at [place] information: [place] [phone] [address] [place] [phone]          Next level of care provider has access to [place] and Suicide Prevention discussed: Yes,  with wife  Have you used any form of tobacco in the last 30 days? (Cigarettes, Smokeless Tobacco, Cigars, and/or Pipes): No (24 years ago)  Has patient been referred to the Quitline?: N/A patient is not a smoker  Patient has been referred for addiction treatment: Yes  Joanne Chars, [place] 06/27/2016, 12:50 PM

## 2016-06-27 NOTE — Anesthesia Procedure Notes (Signed)
Date/Time: 06/27/2016 11:57 AM Performed by: Dionne Bucy Pre-anesthesia Checklist: Patient identified, Emergency Drugs available, Suction available and Patient being monitored Patient Re-evaluated:Patient Re-evaluated prior to inductionOxygen Delivery Method: Circle system utilized Preoxygenation: Pre-oxygenation with 100% oxygen Intubation Type: IV induction Ventilation: Mask ventilation without difficulty and Mask ventilation throughout procedure Airway Equipment and Method: Bite block Placement Confirmation: positive ETCO2 Dental Injury: Teeth and Oropharynx as per pre-operative assessment

## 2016-06-30 ENCOUNTER — Ambulatory Visit: Payer: Self-pay | Admitting: Anesthesiology

## 2016-06-30 ENCOUNTER — Encounter: Payer: Self-pay | Admitting: Anesthesiology

## 2016-06-30 ENCOUNTER — Encounter
Admission: RE | Admit: 2016-06-30 | Discharge: 2016-06-30 | Disposition: A | Discharge status: Home / Self Care | Payer: Medicare Other | Source: Ambulatory Visit | Attending: Psychiatry | Admitting: Psychiatry

## 2016-06-30 ENCOUNTER — Other Ambulatory Visit: Payer: Self-pay | Admitting: Psychiatry

## 2016-06-30 DIAGNOSIS — G4730 Sleep apnea, unspecified: Secondary | ICD-10-CM | POA: Diagnosis not present

## 2016-06-30 DIAGNOSIS — I2510 Atherosclerotic heart disease of native coronary artery without angina pectoris: Secondary | ICD-10-CM | POA: Diagnosis not present

## 2016-06-30 DIAGNOSIS — F332 Major depressive disorder, recurrent severe without psychotic features: Secondary | ICD-10-CM | POA: Diagnosis not present

## 2016-06-30 DIAGNOSIS — I10 Essential (primary) hypertension: Secondary | ICD-10-CM | POA: Diagnosis not present

## 2016-06-30 DIAGNOSIS — F329 Major depressive disorder, single episode, unspecified: Secondary | ICD-10-CM | POA: Diagnosis present

## 2016-06-30 MED ORDER — KETOROLAC TROMETHAMINE 30 MG/ML IJ SOLN
30.0000 mg | Freq: Once | INTRAMUSCULAR | Status: AC
  Administered 2016-06-30: 30 mg via INTRAVENOUS

## 2016-06-30 MED ORDER — KETOROLAC TROMETHAMINE 30 MG/ML IJ SOLN
INTRAMUSCULAR | Status: AC
  Filled 2016-06-30: qty 1

## 2016-06-30 MED ORDER — MIDAZOLAM HCL 2 MG/2ML IJ SOLN
INTRAMUSCULAR | Status: AC
  Filled 2016-06-30: qty 2

## 2016-06-30 MED ORDER — MIDAZOLAM HCL 2 MG/2ML IJ SOLN
INTRAMUSCULAR | Status: DC | PRN
  Administered 2016-06-30: 2 mg via INTRAVENOUS

## 2016-06-30 MED ORDER — SUCCINYLCHOLINE CHLORIDE 20 MG/ML IJ SOLN
INTRAMUSCULAR | Status: AC
  Filled 2016-06-30: qty 1

## 2016-06-30 MED ORDER — SODIUM CHLORIDE 0.9 % IV SOLN
INTRAVENOUS | Status: DC | PRN
  Administered 2016-06-30: 10:00:00 via INTRAVENOUS

## 2016-06-30 MED ORDER — METHOHEXITAL SODIUM 0.5 G IJ SOLR
INTRAMUSCULAR | Status: AC
  Filled 2016-06-30: qty 500

## 2016-06-30 MED ORDER — SUCCINYLCHOLINE CHLORIDE 20 MG/ML IJ SOLN
INTRAMUSCULAR | Status: DC | PRN
  Administered 2016-06-30: 120 mg via INTRAVENOUS

## 2016-06-30 MED ORDER — METHOHEXITAL SODIUM 100 MG/10ML IV SOSY
PREFILLED_SYRINGE | INTRAVENOUS | Status: DC | PRN
  Administered 2016-06-30: 80 mg via INTRAVENOUS

## 2016-06-30 MED ORDER — SODIUM CHLORIDE 0.9 % IV SOLN
500.0000 mL | Freq: Once | INTRAVENOUS | Status: AC
  Administered 2016-06-30: 09:00:00 via INTRAVENOUS

## 2016-06-30 NOTE — Transfer of Care (Signed)
Immediate Anesthesia Transfer of Care Note  Patient: [name]  Procedure(s) Performed: * No procedures listed *  Patient Location: PACU  Anesthesia Type:General  Level of Consciousness: drowsy and patient cooperative  Airway & Oxygen Therapy: Patient Spontanous Breathing and Patient connected to face mask oxygen  Post-op Assessment: Report given to RN and Post -op Vital signs reviewed and stable  Post vital signs: Reviewed and stable  Last Vitals:  Vitals:   06/30/16 0852 06/30/16 1045  BP: (!) 143/100 135/70  Pulse: (!) 54 78  Resp: 19 20  Temp: 36.4 C 36.9 C    Last Pain: There were no vitals filed for this visit.       Complications: No apparent anesthesia complications

## 2016-06-30 NOTE — Anesthesia Postprocedure Evaluation (Signed)
Anesthesia Post Note  Patient: [name]  Procedure(s) Performed: * No procedures listed *  Patient location during evaluation: PACU Anesthesia Type: General Level of consciousness: awake and alert Pain management: pain level controlled Vital Signs Assessment: post-procedure vital signs reviewed and stable Respiratory status: spontaneous breathing and respiratory function stable Cardiovascular status: stable Anesthetic complications: no     Last Vitals:  Vitals:   06/30/16 0852 06/30/16 1045  BP: (!) 143/100 135/70  Pulse: (!) 54 78  Resp: 19 20  Temp: 36.4 C 36.9 C    Last Pain:  Vitals:   06/30/16 1045  TempSrc: Temporal  PainSc: Asleep                 [name] K

## 2016-06-30 NOTE — Discharge Instructions (Signed)
1)  The drugs that you have been given will stay in your system until tomorrow so for the       next 24 hours you should not:  A. Drive an automobile  B. Make any legal decisions  C. Drink any alcoholic beverages  2)  You may resume your regular meals upon return home.  3)  A responsible adult must take you home.  Someone should stay with you for a few          hours, then be available by phone for the remainder of the treatment day.  4)  You May experience any of the following symptoms:  Headache, Nausea and a dry mouth (due to the medications you were given),  temporary memory loss and some confusion, or sore muscles (a warm bath  should help this).  If you you experience any of these symptoms let us know on                your return visit.  5)  Report any of the following: any acute discomfort, severe headache, or temperature        greater than 100.5 F.   Also report any unusual redness, swelling, drainage, or pain         at your IV site.    You may report Symptoms to:  [place] [facility]          Phone: [phone], ECT Department           or Dr. Prescott Gum office [phone]  6)  Your next ECT Treatment is Day Wednesday  Date May 16,2018  We will call 2 days prior to your scheduled appointment for arrival times.  7)  Nothing to eat or drink after midnight the night before your procedure.  8)  Take .   With a sip of water the morning of your procedure.  9)  Other Instructions: Call [phone] to cancel the morning of your procedure due         to illness or emergency.  10) We will call within 72 hours to assess how you are feeling.

## 2016-06-30 NOTE — Anesthesia Preprocedure Evaluation (Signed)
Anesthesia Evaluation  Patient identified by MRN, date of birth, ID band Patient awake    Reviewed: Allergy & Precautions, H&P , NPO status , Patient's Chart, lab work & pertinent test results  History of Anesthesia Complications Negative for: history of anesthetic complications  Airway Mallampati: III  TM Distance: >3 FB Neck ROM: limited    Dental  (+) Poor Dentition, Chipped, Missing, Upper Dentures, Edentulous Lower   Pulmonary shortness of breath and with exertion, sleep apnea , pneumonia, resolved, former smoker,    Pulmonary exam normal breath sounds clear to auscultation       Cardiovascular Exercise Tolerance: Good hypertension, (-) angina+ CAD and + Peripheral Vascular Disease  Normal cardiovascular exam Rhythm:regular Rate:Normal     Neuro/Psych  Headaches, PSYCHIATRIC DISORDERS Anxiety Depression TIA Neuromuscular disease CVA    GI/Hepatic negative GI ROS, Neg liver ROS, neg GERD  ,  Endo/Other  negative endocrine ROS  Renal/GU negative Renal ROS  negative genitourinary   Musculoskeletal  (+) Arthritis ,   Abdominal   Peds  Hematology negative hematology ROS (+)   Anesthesia Other Findings      Reproductive/Obstetrics negative OB ROS                             Anesthesia Physical  Anesthesia Plan  ASA: III  Anesthesia Plan: General   Post-op Pain Management:    Induction: Intravenous  Airway Management Planned: Mask  Additional Equipment:   Intra-op Plan:   Post-operative Plan:   Informed Consent: I have reviewed the patients History and Physical, chart, labs and discussed the procedure including the risks, benefits and alternatives for the proposed anesthesia with the patient or authorized representative who has indicated his/her understanding and acceptance.   Dental Advisory Given  Plan Discussed with: Anesthesiologist, CRNA and Surgeon  Anesthesia  Plan Comments: (Patient reports that his last stroke with in 2010 from PFO that was closed in 2010.   Reports decrease in strength on left side but is able to ambulate normally and has mild loss of strength in left arm.  Plan to use succinylcholine as paralytic.    Patient informed that they are higher risk for complications from anesthesia during this procedure due to their medical history.  Patient voiced understanding.  Patient consented for risks of anesthesia including but not limited to:  - adverse reactions to medications - damage to teeth, lips or other oral mucosa - sore throat or hoarseness - Damage to heart, brain, lungs or loss of life  Patient voiced understanding.)        Anesthesia Quick Evaluation

## 2016-06-30 NOTE — Procedures (Signed)
ECT SERVICES Physician's Interval Evaluation & Treatment Note  Patient Identification: [name] MRN:  [id number] Date of Evaluation:  06/30/2016 TX #: 4  MADRS: 19  MMSE: 27  P.E. Findings:  Heart and lungs normal vitals unremarkable  Psychiatric Interval Note:  Affect slightly blunted not severely so not clear.  Subjective:  Patient is a 66 y.o. male seen for evaluation for Electroconvulsive Therapy. Feeling still a little bit anxious  Treatment Summary:   [x]   Right Unilateral             []  Bilateral   % Energy : 0.3 ms 70%   Impedance: 630 ohms  Seizure Energy Index: 2479 V squared  Postictal Suppression Index: 48%  Seizure Concordance Index: 95%  Medications  Pre Shock: Brevital 80 mg succinylcholine 100 mg  Post Shock: Versed 2 mg  Seizure Duration: 26 seconds by EMG 41 seconds by EEG   Comments: Continue Monday Wednesday and Friday schedule   Lungs:  [x]   Clear to auscultation               []  Other:   Heart:    [x]   Regular rhythm             []  irregular rhythm    [x]   Previous H&P reviewed, patient examined and there are NO CHANGES                 []   Previous H&P reviewed, patient examined and there are changes noted.   Alethia Berthold, MD 5/14/201810:28 AM

## 2016-06-30 NOTE — Anesthesia Post-op Follow-up Note (Cosign Needed)
Anesthesia QCDR form completed.        
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
## 2016-07-02 ENCOUNTER — Other Ambulatory Visit: Payer: Self-pay | Admitting: Psychiatry

## 2016-07-02 ENCOUNTER — Encounter: Payer: Self-pay | Admitting: Anesthesiology

## 2016-07-02 ENCOUNTER — Ambulatory Visit
Admit: 2016-07-02 | Discharge: 2016-07-02 | Disposition: A | Discharge status: Home / Self Care | Payer: Medicare Other | Attending: Psychiatry | Admitting: Psychiatry

## 2016-07-02 DIAGNOSIS — F329 Major depressive disorder, single episode, unspecified: Secondary | ICD-10-CM | POA: Diagnosis not present

## 2016-07-02 DIAGNOSIS — Z87891 Personal history of nicotine dependence: Secondary | ICD-10-CM | POA: Diagnosis not present

## 2016-07-02 DIAGNOSIS — Z8673 Personal history of transient ischemic attack (TIA), and cerebral infarction without residual deficits: Secondary | ICD-10-CM | POA: Diagnosis not present

## 2016-07-02 DIAGNOSIS — F332 Major depressive disorder, recurrent severe without psychotic features: Secondary | ICD-10-CM

## 2016-07-02 DIAGNOSIS — G4730 Sleep apnea, unspecified: Secondary | ICD-10-CM | POA: Diagnosis not present

## 2016-07-02 DIAGNOSIS — I10 Essential (primary) hypertension: Secondary | ICD-10-CM | POA: Diagnosis not present

## 2016-07-02 MED ORDER — SUCCINYLCHOLINE CHLORIDE 20 MG/ML IJ SOLN
INTRAMUSCULAR | Status: DC | PRN
  Administered 2016-07-02: 120 mg via INTRAVENOUS

## 2016-07-02 MED ORDER — SUCCINYLCHOLINE CHLORIDE 20 MG/ML IJ SOLN
INTRAMUSCULAR | Status: AC
  Filled 2016-07-02: qty 1

## 2016-07-02 MED ORDER — LIDOCAINE HCL 2 % IJ SOLN
INTRAMUSCULAR | Status: AC
  Filled 2016-07-02: qty 10

## 2016-07-02 MED ORDER — SODIUM CHLORIDE 0.9 % IV SOLN
500.0000 mL | Freq: Once | INTRAVENOUS | Status: AC
  Administered 2016-07-02: 1000 mL via INTRAVENOUS

## 2016-07-02 MED ORDER — MIDAZOLAM HCL 2 MG/2ML IJ SOLN
INTRAMUSCULAR | Status: AC
  Filled 2016-07-02: qty 2

## 2016-07-02 MED ORDER — MIDAZOLAM HCL 2 MG/2ML IJ SOLN
INTRAMUSCULAR | Status: DC | PRN
  Administered 2016-07-02: 2 mg via INTRAVENOUS

## 2016-07-02 MED ORDER — KETOROLAC TROMETHAMINE 30 MG/ML IJ SOLN
INTRAMUSCULAR | Status: AC
  Administered 2016-07-02: 30 mg via INTRAVENOUS
  Filled 2016-07-02: qty 1

## 2016-07-02 MED ORDER — KETOROLAC TROMETHAMINE 30 MG/ML IJ SOLN
30.0000 mg | Freq: Once | INTRAMUSCULAR | Status: AC
  Administered 2016-07-02: 30 mg via INTRAVENOUS

## 2016-07-02 MED ORDER — FENTANYL CITRATE (PF) 100 MCG/2ML IJ SOLN: 25.0000 ug | INTRAMUSCULAR | Status: DC | PRN

## 2016-07-02 MED ORDER — SODIUM CHLORIDE 0.9 % IV SOLN
INTRAVENOUS | Status: DC | PRN
  Administered 2016-07-02: 10:00:00 via INTRAVENOUS

## 2016-07-02 MED ORDER — LIDOCAINE 2% (20 MG/ML) 5 ML SYRINGE
INTRAMUSCULAR | Status: DC | PRN
  Administered 2016-07-02: 100 mg via INTRAVENOUS

## 2016-07-02 MED ORDER — METHOHEXITAL SODIUM 100 MG/10ML IV SOSY
PREFILLED_SYRINGE | INTRAVENOUS | Status: DC | PRN
  Administered 2016-07-02: 80 mg via INTRAVENOUS
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
## 2016-07-02 NOTE — Anesthesia Post-op Follow-up Note (Cosign Needed)
Anesthesia QCDR form completed.        

## 2016-07-02 NOTE — Discharge Instructions (Signed)
1)  The drugs that you have been given will stay in your system until tomorrow so for the       next 24 hours you should not:  A. Drive an automobile  B. Make any legal decisions  C. Drink any alcoholic beverages  2)  You may resume your regular meals upon return home.  3)  A responsible adult must take you home.  Someone should stay with you for a few          hours, then be available by phone for the remainder of the treatment day.  4)  You May experience any of the following symptoms:  Headache, Nausea and a dry mouth (due to the medications you were given),  temporary memory loss and some confusion, or sore muscles (a warm bath  should help this).  If you you experience any of these symptoms let us know on                your return visit.  5)  Report any of the following: any acute discomfort, severe headache, or temperature        greater than 100.5 F.   Also report any unusual redness, swelling, drainage, or pain         at your IV site.    You may report Symptoms to:  [place] at [facility]          Phone: [phone], ECT Department           or Dr. Prescott Gum office [phone]  6)  Your next ECT Treatment.  Pt to call if ECT is needed  We will call 2 days prior to your scheduled appointment for arrival times.  7)  Nothing to eat or drink after midnight the night before your procedure.  8)  Take lisinopril    With a sip of water the morning of your procedure.  9)  Other Instructions: Call [phone] to cancel the morning of your procedure due         to illness or emergency.  10) We will call within 72 hours to assess how you are feeling.
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
## 2016-07-02 NOTE — Transfer of Care (Signed)
Immediate Anesthesia Transfer of Care Note  Patient: [name]  Procedure(s) Performed: ECT  Patient Location: PACU  Anesthesia Type:General  Level of Consciousness: sedated  Airway & Oxygen Therapy: Patient Spontanous Breathing and Patient connected to face mask oxygen  Post-op Assessment: Report given to RN and Post -op Vital signs reviewed and stable  Post vital signs: Reviewed and stable  Last Vitals:  Vitals:   07/02/16 0933 07/02/16 1118  BP: (!) 173/101 (!) 129/93  Pulse: (!) 49 83  Resp: 18 18  Temp: 36.6 C 36.7 C    Last Pain:  Vitals:   07/02/16 0933  TempSrc: Oral         Complications: No apparent anesthesia complications

## 2016-07-02 NOTE — Procedures (Signed)
ECT SERVICES Physician's Interval Evaluation & Treatment Note  Patient Identification: [name] MRN:  [id number] Date of Evaluation:  07/02/2016 TX #: 5  MADRS:   MMSE:   P.E. Findings:  Patient had no remarkable physical findings prior to treatment. Heart and lungs sounded normal. Vitals unremarkable mood clearly improved no suicidal ideation  Psychiatric Interval Note:  Mood clearly improved no suicidal ideation  Subjective:  Patient is a 65 y.o. male seen for evaluation for Electroconvulsive Therapy. No specific subjective complaints  Treatment Summary:   [x]   Right Unilateral             []  Bilateral   % Energy : 0.3 ms 70%   Impedance: 760 ohms  Seizure Energy Index: 1391 V squared  Postictal Suppression Index: 55%  Seizure Concordance Index: 94%  Medications  Pre Shock: Brevital 80 mg succinylcholine 100 mg  Post Shock: Labetalol 20 mg, Versed 1 mg, lidocaine 100  Seizure Duration: 27 seconds by EMG 37 seconds by EEG   Comments: Seizure went fine but the patient had an arrhythmia during the recovery period with couple of extended sections of ventricular tachycardia. These resolved after only a few seconds. He has no symptoms afterwards. EKG done in recovery was normal. Nevertheless because of concern about this as well as his overall significant improvement in his mood I'm recommending we halted ECT index course for now. Patient will be following up with his outpatient psychiatrist on Monday. He is instructed to go back to his previous medication specifically to increase the dose of his lamotrigine back up to his previous dose. He knows he can get in touch with our service sooner if needed. [facility] should be able to access these records but if not we would be happy to transfer them with notification.   Lungs:  [x]   Clear to auscultation               []  Other:   Heart:    [x]   Regular rhythm             []  irregular rhythm    [x]   Previous  H&P reviewed, patient examined and there are NO CHANGES                 []   Previous H&P reviewed, patient examined and there are changes noted.   Alethia Berthold, MD 5/16/201811:58 AM

## 2016-07-02 NOTE — Anesthesia Procedure Notes (Signed)
Date/Time: 07/02/2016 11:02 AM Performed by: Dionne Bucy

## 2016-07-07 NOTE — Anesthesia Postprocedure Evaluation (Signed)
Anesthesia Post Note  Patient: [name]  Procedure(s) Performed: * No procedures listed *  Patient location during evaluation: PACU Anesthesia Type: General Level of consciousness: awake and alert Pain management: pain level controlled Vital Signs Assessment: post-procedure vital signs reviewed and stable Respiratory status: spontaneous breathing, nonlabored ventilation, respiratory function stable and patient connected to nasal cannula oxygen Cardiovascular status: blood pressure returned to baseline and stable Postop Assessment: no signs of nausea or vomiting Anesthetic complications: no     Last Vitals:  Vitals:   07/02/16 1140 07/02/16 1151  BP: 124/85 135/90  Pulse: 80 80  Resp: (!) 23 16  Temp: 36.3 C 36.4 C    Last Pain:  Vitals:   07/02/16 1151  TempSrc: Oral  PainSc:                  [name]

## 2016-07-15 ENCOUNTER — Other Ambulatory Visit: Payer: Self-pay | Admitting: Psychiatry

## 2016-07-15 ENCOUNTER — Telehealth: Payer: Self-pay | Admitting: Psychiatry
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
## 2016-07-16 NOTE — Telephone Encounter (Signed)
C other no. Spoke to patient's wife yesterday.
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
## 2016-07-17 DIAGNOSIS — F0631 Mood disorder due to known physiological condition with depressive features: Secondary | ICD-10-CM | POA: Diagnosis not present

## 2016-07-21 DIAGNOSIS — F419 Anxiety disorder, unspecified: Secondary | ICD-10-CM | POA: Diagnosis not present

## 2016-07-21 DIAGNOSIS — F331 Major depressive disorder, recurrent, moderate: Secondary | ICD-10-CM | POA: Diagnosis not present

## 2016-07-21 DIAGNOSIS — F450 Somatization disorder: Secondary | ICD-10-CM | POA: Diagnosis not present

## 2016-07-23 DIAGNOSIS — F0631 Mood disorder due to known physiological condition with depressive features: Secondary | ICD-10-CM | POA: Diagnosis not present

## 2016-07-30 DIAGNOSIS — F0631 Mood disorder due to known physiological condition with depressive features: Secondary | ICD-10-CM | POA: Diagnosis not present

## 2016-08-06 DIAGNOSIS — F0631 Mood disorder due to known physiological condition with depressive features: Secondary | ICD-10-CM | POA: Diagnosis not present

## 2016-08-27 DIAGNOSIS — F450 Somatization disorder: Secondary | ICD-10-CM | POA: Diagnosis not present

## 2016-08-27 DIAGNOSIS — F419 Anxiety disorder, unspecified: Secondary | ICD-10-CM | POA: Diagnosis not present

## 2016-08-27 DIAGNOSIS — F331 Major depressive disorder, recurrent, moderate: Secondary | ICD-10-CM | POA: Diagnosis not present

## 2016-09-09 DIAGNOSIS — F0631 Mood disorder due to known physiological condition with depressive features: Secondary | ICD-10-CM | POA: Diagnosis not present

## 2016-09-23 DIAGNOSIS — F0631 Mood disorder due to known physiological condition with depressive features: Secondary | ICD-10-CM | POA: Diagnosis not present

## 2016-10-01 DIAGNOSIS — F4310 Post-traumatic stress disorder, unspecified: Secondary | ICD-10-CM | POA: Diagnosis not present

## 2016-10-01 DIAGNOSIS — F419 Anxiety disorder, unspecified: Secondary | ICD-10-CM | POA: Diagnosis not present

## 2016-10-01 DIAGNOSIS — F331 Major depressive disorder, recurrent, moderate: Secondary | ICD-10-CM | POA: Insufficient documentation

## 2016-10-01 DIAGNOSIS — T50902D Poisoning by unspecified drugs, medicaments and biological substances, intentional self-harm, subsequent encounter: Secondary | ICD-10-CM | POA: Diagnosis not present

## 2016-10-01 DIAGNOSIS — F450 Somatization disorder: Secondary | ICD-10-CM | POA: Diagnosis not present

## 2016-10-07 DIAGNOSIS — F0631 Mood disorder due to known physiological condition with depressive features: Secondary | ICD-10-CM | POA: Diagnosis not present

## 2016-10-22 DIAGNOSIS — F4312 Post-traumatic stress disorder, chronic: Secondary | ICD-10-CM | POA: Diagnosis not present

## 2016-10-22 DIAGNOSIS — F419 Anxiety disorder, unspecified: Secondary | ICD-10-CM | POA: Diagnosis not present

## 2016-10-22 DIAGNOSIS — F329 Major depressive disorder, single episode, unspecified: Secondary | ICD-10-CM | POA: Diagnosis not present

## 2016-10-22 DIAGNOSIS — I10 Essential (primary) hypertension: Secondary | ICD-10-CM | POA: Diagnosis not present

## 2016-10-23 DIAGNOSIS — F0631 Mood disorder due to known physiological condition with depressive features: Secondary | ICD-10-CM | POA: Diagnosis not present

## 2016-10-29 DIAGNOSIS — F450 Somatization disorder: Secondary | ICD-10-CM | POA: Diagnosis not present

## 2016-10-29 DIAGNOSIS — F419 Anxiety disorder, unspecified: Secondary | ICD-10-CM | POA: Diagnosis not present

## 2016-10-29 DIAGNOSIS — F331 Major depressive disorder, recurrent, moderate: Secondary | ICD-10-CM | POA: Diagnosis not present

## 2016-11-11 DIAGNOSIS — F0631 Mood disorder due to known physiological condition with depressive features: Secondary | ICD-10-CM | POA: Diagnosis not present

## 2016-12-03 DIAGNOSIS — F450 Somatization disorder: Secondary | ICD-10-CM | POA: Diagnosis not present

## 2016-12-03 DIAGNOSIS — F419 Anxiety disorder, unspecified: Secondary | ICD-10-CM | POA: Diagnosis not present

## 2016-12-03 DIAGNOSIS — F330 Major depressive disorder, recurrent, mild: Secondary | ICD-10-CM | POA: Diagnosis not present

## 2016-12-04 DIAGNOSIS — F0631 Mood disorder due to known physiological condition with depressive features: Secondary | ICD-10-CM | POA: Diagnosis not present

## 2016-12-12 ENCOUNTER — Other Ambulatory Visit (HOSPITAL_COMMUNITY): Payer: Self-pay | Admitting: Pulmonary Disease

## 2016-12-12 ENCOUNTER — Ambulatory Visit (HOSPITAL_COMMUNITY)
Admission: RE | Admit: 2016-12-12 | Discharge: 2016-12-12 | Disposition: A | Discharge status: Home / Self Care | Payer: Medicare Other | Source: Ambulatory Visit | Attending: Pulmonary Disease | Admitting: Pulmonary Disease

## 2016-12-12 DIAGNOSIS — J9811 Atelectasis: Secondary | ICD-10-CM | POA: Insufficient documentation

## 2016-12-12 DIAGNOSIS — R059 Cough, unspecified: Secondary | ICD-10-CM

## 2016-12-12 DIAGNOSIS — R05 Cough: Secondary | ICD-10-CM | POA: Diagnosis not present

## 2016-12-18 DIAGNOSIS — F0631 Mood disorder due to known physiological condition with depressive features: Secondary | ICD-10-CM | POA: Diagnosis not present

## 2016-12-22 DIAGNOSIS — Z8673 Personal history of transient ischemic attack (TIA), and cerebral infarction without residual deficits: Secondary | ICD-10-CM | POA: Diagnosis not present

## 2016-12-22 DIAGNOSIS — R4689 Other symptoms and signs involving appearance and behavior: Secondary | ICD-10-CM | POA: Diagnosis not present

## 2016-12-22 DIAGNOSIS — R4189 Other symptoms and signs involving cognitive functions and awareness: Secondary | ICD-10-CM | POA: Diagnosis not present

## 2016-12-23 DIAGNOSIS — J209 Acute bronchitis, unspecified: Secondary | ICD-10-CM | POA: Diagnosis not present

## 2016-12-23 DIAGNOSIS — F419 Anxiety disorder, unspecified: Secondary | ICD-10-CM | POA: Diagnosis not present

## 2016-12-23 DIAGNOSIS — I10 Essential (primary) hypertension: Secondary | ICD-10-CM | POA: Diagnosis not present

## 2016-12-23 DIAGNOSIS — Z23 Encounter for immunization: Secondary | ICD-10-CM | POA: Diagnosis not present

## 2016-12-31 DIAGNOSIS — F419 Anxiety disorder, unspecified: Secondary | ICD-10-CM | POA: Diagnosis not present

## 2016-12-31 DIAGNOSIS — F450 Somatization disorder: Secondary | ICD-10-CM | POA: Diagnosis not present

## 2016-12-31 DIAGNOSIS — F330 Major depressive disorder, recurrent, mild: Secondary | ICD-10-CM | POA: Diagnosis not present

## 2017-01-01 DIAGNOSIS — F0631 Mood disorder due to known physiological condition with depressive features: Secondary | ICD-10-CM | POA: Diagnosis not present

## 2017-01-01 DIAGNOSIS — J441 Chronic obstructive pulmonary disease with (acute) exacerbation: Secondary | ICD-10-CM | POA: Diagnosis not present

## 2017-01-05 DIAGNOSIS — G4089 Other seizures: Secondary | ICD-10-CM | POA: Diagnosis not present

## 2017-01-05 DIAGNOSIS — Z9889 Other specified postprocedural states: Secondary | ICD-10-CM | POA: Diagnosis not present

## 2017-01-05 DIAGNOSIS — Z7902 Long term (current) use of antithrombotics/antiplatelets: Secondary | ICD-10-CM | POA: Diagnosis not present

## 2017-01-05 DIAGNOSIS — G459 Transient cerebral ischemic attack, unspecified: Secondary | ICD-10-CM | POA: Diagnosis not present

## 2017-01-05 DIAGNOSIS — R9082 White matter disease, unspecified: Secondary | ICD-10-CM | POA: Diagnosis not present

## 2017-01-05 DIAGNOSIS — R55 Syncope and collapse: Secondary | ICD-10-CM | POA: Diagnosis not present

## 2017-01-05 DIAGNOSIS — E785 Hyperlipidemia, unspecified: Secondary | ICD-10-CM | POA: Diagnosis not present

## 2017-01-05 DIAGNOSIS — I10 Essential (primary) hypertension: Secondary | ICD-10-CM | POA: Diagnosis not present

## 2017-01-05 DIAGNOSIS — Z87891 Personal history of nicotine dependence: Secondary | ICD-10-CM | POA: Diagnosis not present

## 2017-01-05 DIAGNOSIS — I69354 Hemiplegia and hemiparesis following cerebral infarction affecting left non-dominant side: Secondary | ICD-10-CM | POA: Diagnosis not present

## 2017-01-05 DIAGNOSIS — F39 Unspecified mood [affective] disorder: Secondary | ICD-10-CM | POA: Diagnosis not present

## 2017-01-05 DIAGNOSIS — R202 Paresthesia of skin: Secondary | ICD-10-CM | POA: Diagnosis not present

## 2017-01-05 DIAGNOSIS — F329 Major depressive disorder, single episode, unspecified: Secondary | ICD-10-CM | POA: Diagnosis not present

## 2017-01-05 DIAGNOSIS — Z7982 Long term (current) use of aspirin: Secondary | ICD-10-CM | POA: Diagnosis not present

## 2017-01-05 DIAGNOSIS — R404 Transient alteration of awareness: Secondary | ICD-10-CM | POA: Diagnosis not present

## 2017-01-05 DIAGNOSIS — R531 Weakness: Secondary | ICD-10-CM | POA: Diagnosis not present

## 2017-01-05 DIAGNOSIS — R269 Unspecified abnormalities of gait and mobility: Secondary | ICD-10-CM | POA: Diagnosis not present

## 2017-01-05 DIAGNOSIS — I6522 Occlusion and stenosis of left carotid artery: Secondary | ICD-10-CM | POA: Diagnosis not present

## 2017-01-06 DIAGNOSIS — R569 Unspecified convulsions: Secondary | ICD-10-CM | POA: Diagnosis not present

## 2017-01-06 DIAGNOSIS — G458 Other transient cerebral ischemic attacks and related syndromes: Secondary | ICD-10-CM | POA: Diagnosis not present

## 2017-01-06 DIAGNOSIS — Q211 Atrial septal defect: Secondary | ICD-10-CM | POA: Diagnosis not present

## 2017-01-06 DIAGNOSIS — I6310 Cerebral infarction due to embolism of unspecified precerebral artery: Secondary | ICD-10-CM | POA: Diagnosis not present

## 2017-01-06 DIAGNOSIS — I6522 Occlusion and stenosis of left carotid artery: Secondary | ICD-10-CM | POA: Diagnosis not present

## 2017-01-06 DIAGNOSIS — R9082 White matter disease, unspecified: Secondary | ICD-10-CM | POA: Diagnosis not present

## 2017-01-06 DIAGNOSIS — R269 Unspecified abnormalities of gait and mobility: Secondary | ICD-10-CM | POA: Diagnosis not present

## 2017-01-06 DIAGNOSIS — I69354 Hemiplegia and hemiparesis following cerebral infarction affecting left non-dominant side: Secondary | ICD-10-CM | POA: Diagnosis not present

## 2017-01-06 DIAGNOSIS — E785 Hyperlipidemia, unspecified: Secondary | ICD-10-CM | POA: Diagnosis not present

## 2017-01-06 DIAGNOSIS — F329 Major depressive disorder, single episode, unspecified: Secondary | ICD-10-CM | POA: Diagnosis not present

## 2017-01-06 DIAGNOSIS — I6789 Other cerebrovascular disease: Secondary | ICD-10-CM | POA: Diagnosis not present

## 2017-01-06 DIAGNOSIS — F39 Unspecified mood [affective] disorder: Secondary | ICD-10-CM | POA: Diagnosis not present

## 2017-01-06 DIAGNOSIS — G40309 Generalized idiopathic epilepsy and epileptic syndromes, not intractable, without status epilepticus: Secondary | ICD-10-CM | POA: Diagnosis not present

## 2017-01-06 DIAGNOSIS — I10 Essential (primary) hypertension: Secondary | ICD-10-CM | POA: Diagnosis not present

## 2017-01-06 DIAGNOSIS — R55 Syncope and collapse: Secondary | ICD-10-CM | POA: Diagnosis not present

## 2017-01-07 DIAGNOSIS — I6789 Other cerebrovascular disease: Secondary | ICD-10-CM | POA: Diagnosis not present

## 2017-01-07 DIAGNOSIS — G40309 Generalized idiopathic epilepsy and epileptic syndromes, not intractable, without status epilepticus: Secondary | ICD-10-CM | POA: Diagnosis not present

## 2017-01-07 DIAGNOSIS — R569 Unspecified convulsions: Secondary | ICD-10-CM | POA: Diagnosis not present

## 2017-01-07 DIAGNOSIS — R55 Syncope and collapse: Secondary | ICD-10-CM | POA: Diagnosis not present

## 2017-01-07 DIAGNOSIS — G458 Other transient cerebral ischemic attacks and related syndromes: Secondary | ICD-10-CM | POA: Diagnosis not present

## 2017-01-15 DIAGNOSIS — I10 Essential (primary) hypertension: Secondary | ICD-10-CM | POA: Diagnosis not present

## 2017-01-15 DIAGNOSIS — F419 Anxiety disorder, unspecified: Secondary | ICD-10-CM | POA: Diagnosis not present

## 2017-01-15 DIAGNOSIS — F4312 Post-traumatic stress disorder, chronic: Secondary | ICD-10-CM | POA: Diagnosis not present

## 2017-01-15 DIAGNOSIS — G8929 Other chronic pain: Secondary | ICD-10-CM | POA: Diagnosis not present

## 2017-01-21 DIAGNOSIS — F0631 Mood disorder due to known physiological condition with depressive features: Secondary | ICD-10-CM | POA: Diagnosis not present

## 2017-02-04 DIAGNOSIS — F0631 Mood disorder due to known physiological condition with depressive features: Secondary | ICD-10-CM | POA: Diagnosis not present

## 2017-02-13 DIAGNOSIS — H35372 Puckering of macula, left eye: Secondary | ICD-10-CM | POA: Diagnosis not present

## 2017-02-13 DIAGNOSIS — H40013 Open angle with borderline findings, low risk, bilateral: Secondary | ICD-10-CM | POA: Diagnosis not present

## 2017-02-13 DIAGNOSIS — H53453 Other localized visual field defect, bilateral: Secondary | ICD-10-CM | POA: Diagnosis not present

## 2017-02-13 DIAGNOSIS — H25813 Combined forms of age-related cataract, bilateral: Secondary | ICD-10-CM | POA: Diagnosis not present

## 2017-02-16 DIAGNOSIS — R55 Syncope and collapse: Secondary | ICD-10-CM | POA: Diagnosis not present

## 2017-02-16 DIAGNOSIS — F419 Anxiety disorder, unspecified: Secondary | ICD-10-CM | POA: Diagnosis not present

## 2017-02-16 DIAGNOSIS — F4312 Post-traumatic stress disorder, chronic: Secondary | ICD-10-CM | POA: Diagnosis not present

## 2017-02-16 DIAGNOSIS — I10 Essential (primary) hypertension: Secondary | ICD-10-CM | POA: Diagnosis not present

## 2017-02-25 DIAGNOSIS — F450 Somatization disorder: Secondary | ICD-10-CM | POA: Diagnosis not present

## 2017-02-25 DIAGNOSIS — F330 Major depressive disorder, recurrent, mild: Secondary | ICD-10-CM | POA: Diagnosis not present

## 2017-02-25 DIAGNOSIS — F419 Anxiety disorder, unspecified: Secondary | ICD-10-CM | POA: Diagnosis not present

## 2017-03-03 DIAGNOSIS — F0631 Mood disorder due to known physiological condition with depressive features: Secondary | ICD-10-CM | POA: Diagnosis not present

## 2017-03-17 DIAGNOSIS — F0631 Mood disorder due to known physiological condition with depressive features: Secondary | ICD-10-CM | POA: Diagnosis not present

## 2017-03-19 DIAGNOSIS — F0631 Mood disorder due to known physiological condition with depressive features: Secondary | ICD-10-CM | POA: Diagnosis not present

## 2017-03-31 DIAGNOSIS — F0631 Mood disorder due to known physiological condition with depressive features: Secondary | ICD-10-CM | POA: Diagnosis not present

## 2017-04-08 ENCOUNTER — Other Ambulatory Visit (HOSPITAL_COMMUNITY): Payer: Self-pay | Admitting: Pulmonary Disease

## 2017-04-08 DIAGNOSIS — I714 Abdominal aortic aneurysm, without rupture, unspecified: Secondary | ICD-10-CM

## 2017-04-08 DIAGNOSIS — I10 Essential (primary) hypertension: Secondary | ICD-10-CM | POA: Diagnosis not present

## 2017-04-08 DIAGNOSIS — G8929 Other chronic pain: Secondary | ICD-10-CM | POA: Diagnosis not present

## 2017-04-08 DIAGNOSIS — F4312 Post-traumatic stress disorder, chronic: Secondary | ICD-10-CM | POA: Diagnosis not present

## 2017-04-08 DIAGNOSIS — Z136 Encounter for screening for cardiovascular disorders: Secondary | ICD-10-CM

## 2017-04-08 DIAGNOSIS — I6781 Acute cerebrovascular insufficiency: Secondary | ICD-10-CM | POA: Diagnosis not present

## 2017-04-14 ENCOUNTER — Ambulatory Visit (HOSPITAL_COMMUNITY)
Admission: RE | Admit: 2017-04-14 | Discharge: 2017-04-14 | Disposition: A | Discharge status: Home / Self Care | Payer: Medicare Other | Source: Ambulatory Visit | Attending: Pulmonary Disease | Admitting: Pulmonary Disease

## 2017-04-14 DIAGNOSIS — I77811 Abdominal aortic ectasia: Secondary | ICD-10-CM | POA: Diagnosis not present

## 2017-04-14 DIAGNOSIS — I714 Abdominal aortic aneurysm, without rupture: Secondary | ICD-10-CM | POA: Diagnosis not present

## 2017-04-14 DIAGNOSIS — F4312 Post-traumatic stress disorder, chronic: Secondary | ICD-10-CM | POA: Diagnosis not present

## 2017-04-14 DIAGNOSIS — Z136 Encounter for screening for cardiovascular disorders: Secondary | ICD-10-CM | POA: Diagnosis not present

## 2017-04-14 DIAGNOSIS — Z125 Encounter for screening for malignant neoplasm of prostate: Secondary | ICD-10-CM | POA: Diagnosis not present

## 2017-04-14 DIAGNOSIS — G8929 Other chronic pain: Secondary | ICD-10-CM | POA: Diagnosis not present

## 2017-04-14 DIAGNOSIS — R739 Hyperglycemia, unspecified: Secondary | ICD-10-CM | POA: Diagnosis not present

## 2017-04-14 DIAGNOSIS — I6789 Other cerebrovascular disease: Secondary | ICD-10-CM | POA: Diagnosis not present

## 2017-04-14 DIAGNOSIS — I10 Essential (primary) hypertension: Secondary | ICD-10-CM | POA: Diagnosis not present

## 2017-04-15 DIAGNOSIS — F0631 Mood disorder due to known physiological condition with depressive features: Secondary | ICD-10-CM | POA: Diagnosis not present

## 2017-04-22 DIAGNOSIS — F3342 Major depressive disorder, recurrent, in full remission: Secondary | ICD-10-CM | POA: Insufficient documentation

## 2017-04-23 DIAGNOSIS — Z1211 Encounter for screening for malignant neoplasm of colon: Secondary | ICD-10-CM | POA: Diagnosis not present

## 2017-04-29 DIAGNOSIS — F0631 Mood disorder due to known physiological condition with depressive features: Secondary | ICD-10-CM | POA: Diagnosis not present

## 2017-05-13 DIAGNOSIS — F0631 Mood disorder due to known physiological condition with depressive features: Secondary | ICD-10-CM | POA: Diagnosis not present

## 2017-05-28 DIAGNOSIS — F0631 Mood disorder due to known physiological condition with depressive features: Secondary | ICD-10-CM | POA: Diagnosis not present

## 2017-06-18 DIAGNOSIS — F0631 Mood disorder due to known physiological condition with depressive features: Secondary | ICD-10-CM | POA: Diagnosis not present

## 2017-07-07 DIAGNOSIS — Z0000 Encounter for general adult medical examination without abnormal findings: Secondary | ICD-10-CM | POA: Diagnosis not present

## 2017-07-15 DIAGNOSIS — F0631 Mood disorder due to known physiological condition with depressive features: Secondary | ICD-10-CM | POA: Diagnosis not present

## 2017-07-22 ENCOUNTER — Encounter: Payer: Self-pay | Admitting: Nurse Practitioner

## 2017-07-29 ENCOUNTER — Ambulatory Visit: Payer: Medicare Other

## 2017-07-30 DIAGNOSIS — F0631 Mood disorder due to known physiological condition with depressive features: Secondary | ICD-10-CM | POA: Diagnosis not present

## 2017-08-10 ENCOUNTER — Other Ambulatory Visit (HOSPITAL_COMMUNITY): Payer: Self-pay | Admitting: Pulmonary Disease

## 2017-08-10 ENCOUNTER — Ambulatory Visit (HOSPITAL_COMMUNITY)
Admission: RE | Admit: 2017-08-10 | Discharge: 2017-08-10 | Disposition: A | Discharge status: Home / Self Care | Payer: Medicare Other | Source: Ambulatory Visit | Attending: Pulmonary Disease | Admitting: Pulmonary Disease

## 2017-08-10 DIAGNOSIS — R0602 Shortness of breath: Secondary | ICD-10-CM | POA: Diagnosis not present

## 2017-08-10 DIAGNOSIS — R079 Chest pain, unspecified: Secondary | ICD-10-CM

## 2017-08-10 DIAGNOSIS — I2510 Atherosclerotic heart disease of native coronary artery without angina pectoris: Secondary | ICD-10-CM | POA: Insufficient documentation

## 2017-08-10 DIAGNOSIS — J189 Pneumonia, unspecified organism: Secondary | ICD-10-CM | POA: Diagnosis not present

## 2017-08-10 DIAGNOSIS — J984 Other disorders of lung: Secondary | ICD-10-CM | POA: Insufficient documentation

## 2017-08-10 DIAGNOSIS — I700 Atherosclerosis of aorta: Secondary | ICD-10-CM | POA: Insufficient documentation

## 2017-08-10 DIAGNOSIS — I10 Essential (primary) hypertension: Secondary | ICD-10-CM | POA: Diagnosis not present

## 2017-08-10 DIAGNOSIS — J441 Chronic obstructive pulmonary disease with (acute) exacerbation: Secondary | ICD-10-CM | POA: Diagnosis not present

## 2017-08-10 LAB — POCT I-STAT CREATININE: Creatinine, Ser: 0.9 mg/dL (ref 0.61–1.24)

## 2017-08-10 MED ORDER — IOPAMIDOL (ISOVUE-370) INJECTION 76%
100.0000 mL | Freq: Once | INTRAVENOUS | Status: AC | PRN
  Administered 2017-08-10: 100 mL via INTRAVENOUS

## 2017-08-12 DIAGNOSIS — F450 Somatization disorder: Secondary | ICD-10-CM | POA: Diagnosis not present

## 2017-08-12 DIAGNOSIS — F3342 Major depressive disorder, recurrent, in full remission: Secondary | ICD-10-CM | POA: Diagnosis not present

## 2017-08-12 DIAGNOSIS — F419 Anxiety disorder, unspecified: Secondary | ICD-10-CM | POA: Diagnosis not present

## 2017-09-01 DIAGNOSIS — G4733 Obstructive sleep apnea (adult) (pediatric): Secondary | ICD-10-CM | POA: Diagnosis not present

## 2017-09-01 DIAGNOSIS — J449 Chronic obstructive pulmonary disease, unspecified: Secondary | ICD-10-CM | POA: Diagnosis not present

## 2017-09-01 DIAGNOSIS — I10 Essential (primary) hypertension: Secondary | ICD-10-CM | POA: Diagnosis not present

## 2017-09-02 ENCOUNTER — Other Ambulatory Visit (HOSPITAL_COMMUNITY): Payer: Self-pay | Admitting: Respiratory Therapy

## 2017-09-02 DIAGNOSIS — J441 Chronic obstructive pulmonary disease with (acute) exacerbation: Secondary | ICD-10-CM

## 2017-09-02 DIAGNOSIS — F0631 Mood disorder due to known physiological condition with depressive features: Secondary | ICD-10-CM | POA: Diagnosis not present

## 2017-09-03 ENCOUNTER — Ambulatory Visit (HOSPITAL_COMMUNITY)
Admission: RE | Admit: 2017-09-03 | Discharge: 2017-09-03 | Disposition: A | Discharge status: Home / Self Care | Payer: Medicare Other | Source: Ambulatory Visit | Attending: Pulmonary Disease | Admitting: Pulmonary Disease

## 2017-09-03 DIAGNOSIS — J441 Chronic obstructive pulmonary disease with (acute) exacerbation: Secondary | ICD-10-CM | POA: Insufficient documentation

## 2017-09-03 LAB — PULMONARY FUNCTION TEST
DL/VA % pred: 83 %
DL/VA: 3.98 ml/min/mmHg/L
DLCO unc % pred: 58 %
DLCO unc: 21.31 ml/min/mmHg
FEF 25-75 Post: 3.25 L/sec
FEF 25-75 Pre: 2.07 L/sec
FEF2575-%Change-Post: 56 %
FEF2575-%Pred-Post: 116 %
FEF2575-%Pred-Pre: 74 %
FEV1-%Change-Post: 10 %
FEV1-%Pred-Post: 76 %
FEV1-%Pred-Pre: 69 %
FEV1-Post: 2.81 L
FEV1-Pre: 2.54 L
FEV1FVC-%Change-Post: 1 %
FEV1FVC-%Pred-Pre: 102 %
FEV6-%Change-Post: 7 %
FEV6-%Pred-Post: 74 %
FEV6-%Pred-Pre: 69 %
FEV6-Post: 3.52 L
FEV6-Pre: 3.29 L
FEV6FVC-%Change-Post: -1 %
FEV6FVC-%Pred-Post: 102 %
FEV6FVC-%Pred-Pre: 103 %
FVC-%Change-Post: 8 %
FVC-%Pred-Post: 73 %
FVC-%Pred-Pre: 67 %
FVC-Post: 3.63 L
FVC-Pre: 3.34 L
Post FEV1/FVC ratio: 77 %
Post FEV6/FVC ratio: 97 %
Pre FEV1/FVC ratio: 76 %
Pre FEV6/FVC Ratio: 98 %
RV % pred: 117 %
RV: 3.06 L
TLC % pred: 83 %
TLC: 6.35 L

## 2017-09-03 MED ORDER — ALBUTEROL SULFATE (2.5 MG/3ML) 0.083% IN NEBU
2.5000 mg | INHALATION_SOLUTION | Freq: Once | RESPIRATORY_TRACT | Status: AC
  Administered 2017-09-03: 2.5 mg via RESPIRATORY_TRACT

## 2017-09-08 DIAGNOSIS — F419 Anxiety disorder, unspecified: Secondary | ICD-10-CM | POA: Diagnosis not present

## 2017-09-08 DIAGNOSIS — F450 Somatization disorder: Secondary | ICD-10-CM | POA: Diagnosis not present

## 2017-09-08 DIAGNOSIS — F3341 Major depressive disorder, recurrent, in partial remission: Secondary | ICD-10-CM | POA: Diagnosis not present

## 2017-09-28 ENCOUNTER — Other Ambulatory Visit: Payer: Self-pay | Admitting: *Deleted

## 2017-09-28 ENCOUNTER — Encounter: Payer: Self-pay | Admitting: Nurse Practitioner

## 2017-09-28 ENCOUNTER — Encounter

## 2017-09-28 ENCOUNTER — Encounter: Payer: Self-pay | Admitting: *Deleted

## 2017-09-28 ENCOUNTER — Ambulatory Visit (INDEPENDENT_AMBULATORY_CARE_PROVIDER_SITE_OTHER): Payer: Medicare Other | Admitting: Nurse Practitioner

## 2017-09-28 DIAGNOSIS — I499 Cardiac arrhythmia, unspecified: Secondary | ICD-10-CM | POA: Insufficient documentation

## 2017-09-28 DIAGNOSIS — Z860100 Personal history of colon polyps, unspecified: Secondary | ICD-10-CM | POA: Insufficient documentation

## 2017-09-28 MED ORDER — NA SULFATE-K SULFATE-MG SULF 17.5-3.13-1.6 GM/177ML PO SOLN: 1.0000 | ORAL | 0 refills | Status: DC

## 2017-09-28 NOTE — Assessment & Plan Note (Signed)
On exam today I picked up what appears to be an irregular heart rhythm.  We do not have a EKG in office although it seems to be possible atrial fibrillation versus multiple premature atrial contractions.  I have messaged his primary care to further discuss.  He does have a history of multiple CVAs.  He has been seen by cardiology although it seems his last visit was in 2014.  Further recommendations to follow

## 2017-09-28 NOTE — Progress Notes (Signed)
cc'ed to pcp °
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
## 2017-09-28 NOTE — Patient Instructions (Signed)
1. We will schedule your colonoscopy for you. 2. I have called and left a message to discuss with Dr. Luan Pulling your possible irregular heart rhythm to decide if further work-up is needed. 3. Further recommendations will be made after your colonoscopy. 4. Return for follow-up based on recommendations made after your colonoscopy. 5. Call us if you have any questions or concerns.  At [facility] we value your feedback. You may receive a survey about your visit today. Please share your experience as we strive to create trusting relationships with our patients to provide genuine, compassionate, quality care.  It was great to meet you today!  I hope you have a great summer!!

## 2017-09-28 NOTE — Assessment & Plan Note (Signed)
The patient has a personal history of colon polyps.  He is currently due for colonoscopy as it is been 10 years.  At this point we will proceed with scheduling his colonoscopy.  He is generally asymptomatic from a GI standpoint.  Proceed with TCS with Dr. Gala Romney in near future: the risks, benefits, and alternatives have been discussed with the patient in detail. The patient states understanding and desires to proceed.  She is currently on Plavix.  No other anticoagulants, anxiolytics, chronic pain medications, or antidepressants.  Conscious sedation should be adequate for his procedure.  I have discussed with the patient the possibility of needing to return for repeat colonoscopy off Plavix if an exceedingly large polyp is found requiring piecemeal removal.  I have left a message for Dr. Luan Pulling to discuss irregular heart rhythm detected on exam with a history of CVA.  Query atrial fibrillation versus multiple premature atrial contractions.  I am not sure if it is advisable to hold the patient's Plavix prior to his procedure until this is evaluated, even though the patient is requesting to in order to avoid even a small possibility of repeat colonoscopy if a exceedingly large polyp is found.

## 2017-09-30 DIAGNOSIS — I499 Cardiac arrhythmia, unspecified: Secondary | ICD-10-CM | POA: Diagnosis not present

## 2017-09-30 DIAGNOSIS — J449 Chronic obstructive pulmonary disease, unspecified: Secondary | ICD-10-CM | POA: Diagnosis not present

## 2017-09-30 DIAGNOSIS — I10 Essential (primary) hypertension: Secondary | ICD-10-CM | POA: Diagnosis not present

## 2017-09-30 DIAGNOSIS — F0631 Mood disorder due to known physiological condition with depressive features: Secondary | ICD-10-CM | POA: Diagnosis not present

## 2017-09-30 DIAGNOSIS — F451 Undifferentiated somatoform disorder: Secondary | ICD-10-CM | POA: Diagnosis not present

## 2017-10-01 ENCOUNTER — Other Ambulatory Visit (HOSPITAL_COMMUNITY): Payer: Self-pay | Admitting: Pulmonary Disease

## 2017-10-01 DIAGNOSIS — R911 Solitary pulmonary nodule: Secondary | ICD-10-CM

## 2017-10-08 ENCOUNTER — Other Ambulatory Visit: Payer: Self-pay

## 2017-10-08 DIAGNOSIS — I499 Cardiac arrhythmia, unspecified: Secondary | ICD-10-CM

## 2017-10-13 DIAGNOSIS — F0631 Mood disorder due to known physiological condition with depressive features: Secondary | ICD-10-CM | POA: Diagnosis not present

## 2017-10-13 DIAGNOSIS — F419 Anxiety disorder, unspecified: Secondary | ICD-10-CM | POA: Diagnosis not present

## 2017-10-13 DIAGNOSIS — F450 Somatization disorder: Secondary | ICD-10-CM | POA: Diagnosis not present

## 2017-10-13 DIAGNOSIS — F3341 Major depressive disorder, recurrent, in partial remission: Secondary | ICD-10-CM | POA: Diagnosis not present

## 2017-10-14 ENCOUNTER — Ambulatory Visit (INDEPENDENT_AMBULATORY_CARE_PROVIDER_SITE_OTHER): Payer: Medicare Other

## 2017-10-14 DIAGNOSIS — I499 Cardiac arrhythmia, unspecified: Secondary | ICD-10-CM | POA: Diagnosis not present

## 2017-10-28 ENCOUNTER — Encounter (HOSPITAL_COMMUNITY): Payer: Self-pay | Admitting: *Deleted

## 2017-10-28 ENCOUNTER — Ambulatory Visit (HOSPITAL_COMMUNITY)
Admission: RE | Admit: 2017-10-28 | Discharge: 2017-10-28 | Disposition: A | Discharge status: Home / Self Care | Payer: Medicare Other | Source: Ambulatory Visit | Attending: Internal Medicine | Admitting: Internal Medicine

## 2017-10-28 ENCOUNTER — Encounter (HOSPITAL_COMMUNITY)
Admission: RE | Disposition: A | Discharge status: Home / Self Care | Payer: Self-pay | Source: Ambulatory Visit | Attending: Internal Medicine

## 2017-10-28 ENCOUNTER — Other Ambulatory Visit: Payer: Self-pay

## 2017-10-28 DIAGNOSIS — Z87891 Personal history of nicotine dependence: Secondary | ICD-10-CM | POA: Diagnosis not present

## 2017-10-28 DIAGNOSIS — D123 Benign neoplasm of transverse colon: Secondary | ICD-10-CM | POA: Diagnosis not present

## 2017-10-28 DIAGNOSIS — Z8673 Personal history of transient ischemic attack (TIA), and cerebral infarction without residual deficits: Secondary | ICD-10-CM | POA: Diagnosis not present

## 2017-10-28 DIAGNOSIS — Z1211 Encounter for screening for malignant neoplasm of colon: Secondary | ICD-10-CM | POA: Insufficient documentation

## 2017-10-28 DIAGNOSIS — G4730 Sleep apnea, unspecified: Secondary | ICD-10-CM | POA: Diagnosis not present

## 2017-10-28 DIAGNOSIS — K648 Other hemorrhoids: Secondary | ICD-10-CM | POA: Insufficient documentation

## 2017-10-28 DIAGNOSIS — K5730 Diverticulosis of large intestine without perforation or abscess without bleeding: Secondary | ICD-10-CM | POA: Insufficient documentation

## 2017-10-28 DIAGNOSIS — Z7982 Long term (current) use of aspirin: Secondary | ICD-10-CM | POA: Diagnosis not present

## 2017-10-28 DIAGNOSIS — F329 Major depressive disorder, single episode, unspecified: Secondary | ICD-10-CM | POA: Diagnosis not present

## 2017-10-28 DIAGNOSIS — F4310 Post-traumatic stress disorder, unspecified: Secondary | ICD-10-CM | POA: Insufficient documentation

## 2017-10-28 DIAGNOSIS — Z79899 Other long term (current) drug therapy: Secondary | ICD-10-CM | POA: Diagnosis not present

## 2017-10-28 DIAGNOSIS — Z86010 Personal history of colonic polyps: Secondary | ICD-10-CM

## 2017-10-28 HISTORY — PX: POLYPECTOMY: SHX5525

## 2017-10-28 HISTORY — PX: COLONOSCOPY: SHX5424

## 2017-10-28 SURGERY — COLONOSCOPY
Anesthesia: Moderate Sedation

## 2017-10-28 MED ORDER — ONDANSETRON HCL 4 MG/2ML IJ SOLN
INTRAMUSCULAR | Status: AC
  Filled 2017-10-28: qty 2

## 2017-10-28 MED ORDER — MIDAZOLAM HCL 5 MG/5ML IJ SOLN
INTRAMUSCULAR | Status: AC
  Filled 2017-10-28: qty 5

## 2017-10-28 MED ORDER — MEPERIDINE HCL 50 MG/ML IJ SOLN
INTRAMUSCULAR | Status: AC
  Filled 2017-10-28: qty 1

## 2017-10-28 MED ORDER — ONDANSETRON HCL 4 MG/2ML IJ SOLN
INTRAMUSCULAR | Status: DC | PRN
  Administered 2017-10-28: 4 mg via INTRAVENOUS

## 2017-10-28 MED ORDER — MEPERIDINE HCL 100 MG/ML IJ SOLN
INTRAMUSCULAR | Status: DC | PRN
  Administered 2017-10-28: 25 mg via INTRAVENOUS
  Administered 2017-10-28: 15 mg via INTRAVENOUS

## 2017-10-28 MED ORDER — MIDAZOLAM HCL 5 MG/5ML IJ SOLN
INTRAMUSCULAR | Status: DC | PRN
  Administered 2017-10-28 (×2): 1 mg via INTRAVENOUS
  Administered 2017-10-28: 2 mg via INTRAVENOUS
  Administered 2017-10-28: 1 mg via INTRAVENOUS

## 2017-10-28 MED ORDER — SODIUM CHLORIDE 0.9 % IV SOLN
INTRAVENOUS | Status: DC
  Administered 2017-10-28: 13:00:00 via INTRAVENOUS

## 2017-10-28 MED ORDER — STERILE WATER FOR IRRIGATION IR SOLN
Status: DC | PRN
  Administered 2017-10-28: 1.5 mL

## 2017-10-28 NOTE — Op Note (Signed)
[facility] Patient Name: [name] Procedure Date: 10/28/2017 12:56 PM MRN: [id number] Date of Birth: 1950/06/27 Attending MD: Norvel Richards , MD CSN: [id number] Age: 67 Admit Type: Outpatient Procedure:                Colonoscopy Indications:              Screening for colorectal malignant neoplasm Providers:                Norvel Richards, MD, Charlsie Quest. Theda Sers RN, RN,                            Nelma Rothman, Technician Referring MD:              Medicines:                Midazolam 4 mg IV, Meperidine 40 mg IV, Ondansetron                            4 mg IV Complications:            No immediate complications. Estimated Blood Loss:     Estimated blood loss was minimal. Procedure:                Pre-Anesthesia Assessment:                           - Prior to the procedure, a History and Physical                            was performed, and patient medications and                            allergies were reviewed. The patient's tolerance of                            previous anesthesia was also reviewed. The risks                            and benefits of the procedure and the sedation                            options and risks were discussed with the patient.                            All questions were answered, and informed consent                            was obtained. Prior Anticoagulants: The patient has                            taken no previous anticoagulant or antiplatelet                            agents. ASA Grade Assessment: II - A patient with  mild systemic disease. After reviewing the risks                            and benefits, the patient was deemed in                            satisfactory condition to undergo the procedure.                           After obtaining informed consent, the colonoscope                            was passed under direct vision. Throughout the   procedure, the patient's blood pressure, pulse, and                            oxygen saturations were monitored continuously. The                            CF-HQ190L ([id number]) scope was introduced through                            the anus and advanced to the the cecum, identified                            by appendiceal orifice and ileocecal valve. The                            colonoscopy was performed without difficulty. The                            patient tolerated the procedure well. The quality                            of the bowel preparation was adequate. Scope In: 1:07:48 PM Scope Out: 1:22:27 PM Scope Withdrawal Time: 0 hours 9 minutes 36 seconds  Total Procedure Duration: 0 hours 14 minutes 39 seconds  Findings:      The perianal and digital rectal examinations were normal.      Scattered medium-mouthed diverticula were found in the entire colon.      Two sessile polyps were found in the hepatic flexure. The polyps were 4       to 5 mm in size. These polyps were removed with a cold snare. Resection       and retrieval were complete. Estimated blood loss was minimal.      Non-bleeding internal hemorrhoids were found during retroflexion. The       hemorrhoids were moderate and medium-sized.      The exam was otherwise without abnormality on direct and retroflexion       views. Impression:               - Diverticulosis in the entire examined colon.                           - Two 4 to 5 mm polyps at the hepatic flexure,  removed with a cold snare. Resected and retrieved.                           - Non-bleeding internal hemorrhoids.                           - The examination was otherwise normal on direct                            and retroflexion views. Moderate Sedation:      Moderate (conscious) sedation was administered by the endoscopy nurse       and supervised by the endoscopist. The following parameters were       monitored: oxygen  saturation, heart rate, blood pressure, respiratory       rate, EKG, adequacy of pulmonary ventilation, and response to care.       Total physician intraservice time was 19 minutes. Recommendation:           - Patient has a contact number available for                            emergencies. The signs and symptoms of potential                            delayed complications were discussed with the                            patient. Return to normal activities tomorrow.                            Written discharge instructions were provided to the                            patient.                           - Resume previous diet.                           - Continue present medications.                           - Repeat colonoscopy date to be determined after                            pending pathology results are reviewed for                            surveillance.                           - Return to GI office after studies are complete.                            Resume Plavix tomorrow. Procedure Code(s):        --- Professional ---  45385, Colonoscopy, flexible; with removal of                            tumor(s), polyp(s), or other lesion(s) by snare                            technique                           G0500, Moderate sedation services provided by the                            same physician or other qualified health care                            professional performing a gastrointestinal                            endoscopic service that sedation supports,                            requiring the presence of an independent trained                            observer to assist in the monitoring of the                            patient's level of consciousness and physiological                            status; initial 15 minutes of intra-service time;                            patient age 38 years or older (additional time may                             be reported with [phone], as appropriate) Diagnosis Code(s):        --- Professional ---                           Z12.11, Encounter for screening for malignant                            neoplasm of colon                           K64.8, Other hemorrhoids                           D12.3, Benign neoplasm of transverse colon (hepatic                            flexure or splenic flexure)                           K57.30, Diverticulosis of  large intestine without                            perforation or abscess without bleeding CPT copyright 2017 [employer]. All rights reserved. The codes documented in this report are preliminary and upon coder review may  be revised to meet current compliance requirements. Cristopher Estimable. Travian Kerner, MD Norvel Richards, MD 10/28/2017 1:31:38 PM This report has been signed electronically. Number of Addenda: 0

## 2017-10-28 NOTE — Interval H&P Note (Signed)
History and Physical Interval Note:  10/28/2017 12:51 PM  [name]  has presented today for surgery, with the diagnosis of history of polyps  The various methods of treatment have been discussed with the patient and family. After consideration of risks, benefits and other options for treatment, the patient has consented to  Procedure(s) with comments: COLONOSCOPY (N/A) - 1:00pm as a surgical intervention .  The patient's history has been reviewed, patient examined, no change in status, stable for surgery.  I have reviewed the patient's chart and labs.  Questions were answered to the patient's satisfaction.     Robert Rourk  No change. Surveillance colonoscopy per plan.  The risks, benefits, limitations, alternatives and imponderables have been reviewed with the patient. Questions have been answered. All parties are agreeable.

## 2017-10-28 NOTE — Discharge Instructions (Signed)
Colonoscopy Discharge Instructions  Read the instructions outlined below and refer to this sheet in the next few weeks. These discharge instructions provide you with general information on caring for yourself after you leave the hospital. Your doctor may also give you specific instructions. While your treatment has been planned according to the most current medical practices available, unavoidable complications occasionally occur. If you have any problems or questions after discharge, call Dr. Gala Romney at [phone]. ACTIVITY  You may resume your regular activity, but move at a slower pace for the next 24 hours.   Take frequent rest periods for the next 24 hours.   Walking will help get rid of the air and reduce the bloated feeling in your belly (abdomen).   No driving for 24 hours (because of the medicine (anesthesia) used during the test).    Do not sign any important legal documents or operate any machinery for 24 hours (because of the anesthesia used during the test).  NUTRITION  Drink plenty of fluids.   You may resume your normal diet as instructed by your doctor.   Begin with a light meal and progress to your normal diet. Heavy or fried foods are harder to digest and may make you feel sick to your stomach (nauseated).   Avoid alcoholic beverages for 24 hours or as instructed.  MEDICATIONS  You may resume your normal medications unless your doctor tells you otherwise.  WHAT YOU CAN EXPECT TODAY  Some feelings of bloating in the abdomen.   Passage of more gas than usual.   Spotting of blood in your stool or on the toilet paper.  IF YOU HAD POLYPS REMOVED DURING THE COLONOSCOPY:  No aspirin products for 7 days or as instructed.   No alcohol for 7 days or as instructed.   Eat a soft diet for the next 24 hours.  FINDING OUT THE RESULTS OF YOUR TEST Not all test results are available during your visit. If your test results are not back during the visit, make an appointment  with your caregiver to find out the results. Do not assume everything is normal if you have not heard from your caregiver or the medical facility. It is important for you to follow up on all of your test results.  SEEK IMMEDIATE MEDICAL ATTENTION IF:  You have more than a spotting of blood in your stool.   Your belly is swollen (abdominal distention).   You are nauseated or vomiting.   You have a temperature over 101.   You have abdominal pain or discomfort that is severe or gets worse throughout the day.    Diverticulosis and colon polyp  Information provided  Resume Plavix and Aspirin tomorrow  Further recommendations to follow  Pending review of pathology report.   Diverticulosis Diverticulosis is a condition that develops when small pouches (diverticula) form in the wall of the large intestine (colon). The colon is where water is absorbed and stool is formed. The pouches form when the inside layer of the colon pushes through weak spots in the outer layers of the colon. You may have a few pouches or many of them. What are the causes? The cause of this condition is not known. What increases the risk? The following factors may make you more likely to develop this condition:  Being older than age 41. Your risk for this condition increases with age. Diverticulosis is rare among people younger than age 96. By age 2, many people have it.  Eating a low-fiber  diet.  Having frequent constipation.  Being overweight.  Not getting enough exercise.  Smoking.  Taking over-the-counter pain medicines, like aspirin and ibuprofen.  Having a family history of diverticulosis.  What are the signs or symptoms? In most people, there are no symptoms of this condition. If you do have symptoms, they may include:  Bloating.  Cramps in the abdomen.  Constipation or diarrhea.  Pain in the lower left side of the abdomen.  How is this diagnosed? This condition is most often diagnosed  during an exam for other colon problems. Because diverticulosis usually has no symptoms, it often cannot be diagnosed independently. This condition may be diagnosed by:  Using a flexible scope to examine the colon (colonoscopy).  Taking an X-ray of the colon after dye has been put into the colon (barium enema).  Doing a CT scan.  How is this treated? You may not need treatment for this condition if you have never developed an infection related to diverticulosis. If you have had an infection before, treatment may include:  Eating a high-fiber diet. This may include eating more fruits, vegetables, and grains.  Taking a fiber supplement.  Taking a live bacteria supplement (probiotic).  Taking medicine to relax your colon.  Taking antibiotic medicines.  Follow these instructions at home:  Drink 6-8 glasses of water or more each day to prevent constipation.  Try not to strain when you have a bowel movement.  If you have had an infection before: ? Eat more fiber as directed by your health care provider or your diet and nutrition specialist (dietitian). ? Take a fiber supplement or probiotic, if your health care provider approves.  Take over-the-counter and prescription medicines only as told by your health care provider.  If you were prescribed an antibiotic, take it as told by your health care provider. Do not stop taking the antibiotic even if you start to feel better.  Keep all follow-up visits as told by your health care provider. This is important. Contact a health care provider if:  You have pain in your abdomen.  You have bloating.  You have cramps.  You have not had a bowel movement in 3 days. Get help right away if:  Your pain gets worse.  Your bloating becomes very bad.  You have a fever or chills, and your symptoms suddenly get worse.  You vomit.  You have bowel movements that are bloody or black.  You have bleeding from your  rectum. Summary  Diverticulosis is a condition that develops when small pouches (diverticula) form in the wall of the large intestine (colon).  You may have a few pouches or many of them.  This condition is most often diagnosed during an exam for other colon problems.  If you have had an infection related to diverticulosis, treatment may include increasing the fiber in your diet, taking supplements, or taking medicines. This information is not intended to replace advice given to you by your health care provider. Make sure you discuss any questions you have with your health care provider.    Colon Polyps Polyps are tissue growths inside the body. Polyps can grow in many places, including the large intestine (colon). A polyp may be a round bump or a mushroom-shaped growth. You could have one polyp or several. Most colon polyps are noncancerous (benign). However, some colon polyps can become cancerous over time. What are the causes? The exact cause of colon polyps is not known. What increases the risk? This condition is  more likely to develop in people who: Have a family history of colon cancer or colon polyps. Are older than 38 or older than 45 if they are African American. Have inflammatory bowel disease, such as ulcerative colitis or Crohn disease. Are overweight. Smoke cigarettes. Do not get enough exercise. Drink too much alcohol. Eat a diet that is: High in fat and red meat. Low in fiber. Had childhood cancer that was treated with abdominal radiation.  What are the signs or symptoms? Most polyps do not cause symptoms. If you have symptoms, they may include: Blood coming from your rectum when having a bowel movement. Blood in your stool.The stool may look dark red or black. A change in bowel habits, such as constipation or diarrhea.  How is this diagnosed? This condition is diagnosed with a colonoscopy. This is a procedure that uses a lighted, flexible scope to look at the  inside of your colon. How is this treated? Treatment for this condition involves removing any polyps that are found. Those polyps will then be tested for cancer. If cancer is found, your health care provider will talk to you about options for colon cancer treatment. Follow these instructions at home: Diet Eat plenty of fiber, such as fruits, vegetables, and whole grains. Eat foods that are high in calcium and vitamin D, such as milk, cheese, yogurt, eggs, liver, fish, and broccoli. Limit foods high in fat, red meats, and processed meats, such as hot dogs, sausage, bacon, and lunch meats. Maintain a healthy weight, or lose weight if recommended by your health care provider. General instructions Do not smoke cigarettes. Do not drink alcohol excessively. Keep all follow-up visits as told by your health care provider. This is important. This includes keeping regularly scheduled colonoscopies. Talk to your health care provider about when you need a colonoscopy. Exercise every day or as told by your health care provider. Contact a health care provider if: You have new or worsening bleeding during a bowel movement. You have new or increased blood in your stool. You have a change in bowel habits. You unexpectedly lose weight. This information is not intended to replace advice given to you by your health care provider. Make sure you discuss any questions you have with your health care provider.

## 2017-11-03 ENCOUNTER — Encounter: Payer: Self-pay | Admitting: Internal Medicine

## 2017-11-03 ENCOUNTER — Encounter (HOSPITAL_COMMUNITY): Payer: Self-pay | Admitting: Internal Medicine

## 2017-11-03 DIAGNOSIS — F0631 Mood disorder due to known physiological condition with depressive features: Secondary | ICD-10-CM | POA: Diagnosis not present

## 2017-11-12 ENCOUNTER — Ambulatory Visit (HOSPITAL_COMMUNITY)
Admission: RE | Admit: 2017-11-12 | Discharge: 2017-11-12 | Disposition: A | Discharge status: Home / Self Care | Payer: Medicare Other | Source: Ambulatory Visit | Attending: Pulmonary Disease | Admitting: Pulmonary Disease

## 2017-11-12 DIAGNOSIS — R911 Solitary pulmonary nodule: Secondary | ICD-10-CM | POA: Insufficient documentation

## 2017-11-12 DIAGNOSIS — R918 Other nonspecific abnormal finding of lung field: Secondary | ICD-10-CM | POA: Diagnosis not present

## 2017-11-12 DIAGNOSIS — I700 Atherosclerosis of aorta: Secondary | ICD-10-CM | POA: Diagnosis not present

## 2017-11-12 DIAGNOSIS — K8020 Calculus of gallbladder without cholecystitis without obstruction: Secondary | ICD-10-CM | POA: Diagnosis not present

## 2017-11-16 ENCOUNTER — Other Ambulatory Visit (HOSPITAL_COMMUNITY): Payer: Self-pay | Admitting: Pulmonary Disease

## 2017-11-16 DIAGNOSIS — I700 Atherosclerosis of aorta: Secondary | ICD-10-CM | POA: Diagnosis not present

## 2017-11-16 DIAGNOSIS — K8080 Other cholelithiasis without obstruction: Secondary | ICD-10-CM

## 2017-11-16 DIAGNOSIS — J449 Chronic obstructive pulmonary disease, unspecified: Secondary | ICD-10-CM | POA: Diagnosis not present

## 2017-11-16 DIAGNOSIS — I10 Essential (primary) hypertension: Secondary | ICD-10-CM | POA: Diagnosis not present

## 2017-11-16 DIAGNOSIS — Z23 Encounter for immunization: Secondary | ICD-10-CM | POA: Diagnosis not present

## 2017-11-16 DIAGNOSIS — I499 Cardiac arrhythmia, unspecified: Secondary | ICD-10-CM | POA: Diagnosis not present

## 2017-11-19 ENCOUNTER — Ambulatory Visit (HOSPITAL_COMMUNITY)
Admission: RE | Admit: 2017-11-19 | Discharge: 2017-11-19 | Disposition: A | Discharge status: Home / Self Care | Payer: Medicare Other | Source: Ambulatory Visit | Attending: Pulmonary Disease | Admitting: Pulmonary Disease

## 2017-11-19 DIAGNOSIS — K8020 Calculus of gallbladder without cholecystitis without obstruction: Secondary | ICD-10-CM | POA: Diagnosis not present

## 2017-11-19 DIAGNOSIS — R109 Unspecified abdominal pain: Secondary | ICD-10-CM | POA: Diagnosis present

## 2017-11-19 DIAGNOSIS — K8080 Other cholelithiasis without obstruction: Secondary | ICD-10-CM

## 2017-11-19 DIAGNOSIS — D734 Cyst of spleen: Secondary | ICD-10-CM | POA: Insufficient documentation

## 2017-11-20 ENCOUNTER — Other Ambulatory Visit (HOSPITAL_COMMUNITY): Payer: Self-pay | Admitting: Pulmonary Disease

## 2017-11-20 DIAGNOSIS — R1084 Generalized abdominal pain: Secondary | ICD-10-CM

## 2017-11-23 ENCOUNTER — Encounter (HOSPITAL_COMMUNITY)
Admission: RE | Admit: 2017-11-23 | Discharge: 2017-11-23 | Disposition: A | Discharge status: Home / Self Care | Payer: Medicare Other | Source: Ambulatory Visit | Attending: Pulmonary Disease | Admitting: Pulmonary Disease

## 2017-11-23 ENCOUNTER — Encounter (HOSPITAL_COMMUNITY): Payer: Self-pay

## 2017-11-23 DIAGNOSIS — R948 Abnormal results of function studies of other organs and systems: Secondary | ICD-10-CM | POA: Diagnosis not present

## 2017-11-23 DIAGNOSIS — R1084 Generalized abdominal pain: Secondary | ICD-10-CM | POA: Insufficient documentation

## 2017-11-23 MED ORDER — TECHNETIUM TC 99M MEBROFENIN IV KIT
5.0000 | PACK | Freq: Once | INTRAVENOUS | Status: AC | PRN
  Administered 2017-11-23: 5.1 via INTRAVENOUS

## 2017-11-24 DIAGNOSIS — F0631 Mood disorder due to known physiological condition with depressive features: Secondary | ICD-10-CM | POA: Diagnosis not present

## 2017-12-03 DIAGNOSIS — F0631 Mood disorder due to known physiological condition with depressive features: Secondary | ICD-10-CM | POA: Diagnosis not present

## 2017-12-08 ENCOUNTER — Encounter: Payer: Self-pay | Admitting: General Surgery

## 2017-12-08 ENCOUNTER — Ambulatory Visit (INDEPENDENT_AMBULATORY_CARE_PROVIDER_SITE_OTHER): Payer: Medicare Other | Admitting: General Surgery

## 2017-12-08 VITALS — BP 142/86 | HR 58 | Temp 98.4°F | Resp 20 | Wt 241.8 lb

## 2017-12-08 DIAGNOSIS — K828 Other specified diseases of gallbladder: Secondary | ICD-10-CM | POA: Insufficient documentation

## 2017-12-08 DIAGNOSIS — K8020 Calculus of gallbladder without cholecystitis without obstruction: Secondary | ICD-10-CM | POA: Insufficient documentation

## 2017-12-08 NOTE — Patient Instructions (Signed)
Cholelithiasis Cholelithiasis is a form of gallbladder disease in which gallstones form in the gallbladder. The gallbladder is an organ that stores bile. Bile is made in the liver, and it helps to digest fats. Gallstones begin as small crystals and slowly grow into stones. They may cause no symptoms until the gallbladder tightens (contracts) and a gallstone is blocking the duct (gallbladder attack), which can cause pain. Cholelithiasis is also referred to as gallstones. There are two main types of gallstones:  Cholesterol stones. These are made of hardened cholesterol and are usually yellow-green in color. They are the most common type of gallstone. Cholesterol is a white, waxy, fat-like substance that is made in the liver.  Pigment stones. These are dark in color and are made of a red-yellow substance that forms when hemoglobin from red blood cells breaks down (bilirubin).  What are the causes? This condition may be caused by an imbalance in the substances that bile is made of. This can happen if the bile:  Has too much bilirubin.  Has too much cholesterol.  Does not have enough bile salts. These salts help the body absorb and digest fats.  In some cases, this condition can also be caused by the gallbladder not emptying completely or often enough. What increases the risk? The following factors may make you more likely to develop this condition:  Being male.  Having multiple pregnancies. Health care providers sometimes advise removing diseased gallbladders before future pregnancies.  Eating a diet that is heavy in fried foods, fat, and refined carbohydrates, like white bread and white rice.  Being obese.  Being older than age 48.  Prolonged use of medicines that contain male hormones (estrogen).  Having diabetes mellitus.  Rapidly losing weight.  Having a family history of gallstones.  Being of [place] or [place] descent.  Having an intestinal disease such as  Crohn disease.  Having metabolic syndrome.  Having cirrhosis.  Having severe types of anemia such as sickle cell anemia.  What are the signs or symptoms? In most cases, there are no symptoms. These are known as silent gallstones. If a gallstone blocks the bile ducts, it can cause a gallbladder attack. The main symptom of a gallbladder attack is sudden pain in the upper right abdomen. The pain usually comes at night or after eating a large meal. The pain can last for one or several hours and can spread to the right shoulder or chest. If the bile duct is blocked for more than a few hours, it can cause infection or inflammation of the gallbladder, liver, or pancreas, which may cause:  Nausea.  Vomiting.  Abdominal pain that lasts for 5 hours or more.  Fever or chills.  Yellowing of the skin or the whites of the eyes (jaundice).  Dark urine.  Light-colored stools.  How is this diagnosed? This condition may be diagnosed based on:  A physical exam.  Your medical history.  An ultrasound of your gallbladder.  CT scan.  MRI.  Blood tests to check for signs of infection or inflammation.  A scan of your gallbladder and bile ducts (biliary system) using nonharmful radioactive material and special cameras that can see the radioactive material (cholescintigram). This test checks to see how your gallbladder contracts and whether bile ducts are blocked.  Inserting a small tube with a camera on the end (endoscope) through your mouth to inspect bile ducts and check for blockages (endoscopic retrograde cholangiopancreatogram).  How is this treated? Treatment for gallstones depends on the  severity of the condition. Silent gallstones do not need treatment. If the gallstones cause a gallbladder attack or other symptoms, treatment may be required. Options for treatment include:  Surgery to remove the gallbladder (cholecystectomy). This is the most common treatment.  Medicines to dissolve  gallstones. These are most effective at treating small gallstones. You may need to take medicines for up to 6-12 months.  Shock wave treatment (extracorporeal biliary lithotripsy). In this treatment, an ultrasound machine sends shock waves to the gallbladder to break gallstones into smaller pieces. These pieces can then be passed into the intestines or be dissolved by medicine. This is rarely used.  Removing gallstones through endoscopic retrograde cholangiopancreatogram. A small basket can be attached to the endoscope and used to capture and remove gallstones.  Follow these instructions at home:  Take over-the-counter and prescription medicines only as told by your health care provider.  Maintain a healthy weight and follow a healthy diet. This includes: ? Reducing fatty foods, such as fried food. ? Reducing refined carbohydrates, like white bread and white rice. ? Increasing fiber. Aim for foods like almonds, fruit, and beans.  Keep all follow-up visits as told by your health care provider. This is important. Contact a health care provider if:  You think you have had a gallbladder attack.  You have been diagnosed with silent gallstones and you develop abdominal pain or indigestion. Get help right away if:  You have pain from a gallbladder attack that lasts for more than 2 hours.  You have abdominal pain that lasts for more than 5 hours.  You have a fever or chills.  You have persistent nausea and vomiting.  You develop jaundice.  You have dark urine or light-colored stools. Summary  Cholelithiasis (also called gallstones) is a form of gallbladder disease in which gallstones form in the gallbladder.  This condition is caused by an imbalance in the substances that make up bile. This can happen if the bile has too much cholesterol, too much bilirubin, or not enough bile salts.  You are more likely to develop this condition if you are male, pregnant, using medicines with  estrogen, obese, older than age 23, or have a family history of gallstones. You may also develop gallstones if you have diabetes, an intestinal disease, cirrhosis, or metabolic syndrome.  Treatment for gallstones depends on the severity of the condition. Silent gallstones do not need treatment.  If gallstones cause a gallbladder attack or other symptoms, treatment may be needed. The most common treatment is surgery to remove the gallbladder. This information is not intended to replace advice given to you by your health care provider. Make sure you discuss any questions you have with your health care provider. Document Released: 01/30/2005 Document Revised: 10/21/2015 Document Reviewed: 10/21/2015 Elsevier Interactive Patient Education  2018 [employer].  Laparoscopic Cholecystectomy Laparoscopic cholecystectomy is surgery to remove the gallbladder. The gallbladder is a pear-shaped organ that lies beneath the liver on the right side of the body. The gallbladder stores bile, which is a fluid that helps the body to digest fats. Cholecystectomy is often done for inflammation of the gallbladder (cholecystitis). This condition is usually caused by a buildup of gallstones (cholelithiasis) in the gallbladder. Gallstones can block the flow of bile, which can result in inflammation and pain. In severe cases, emergency surgery may be required. This procedure is done though small incisions in your abdomen (laparoscopic surgery). A thin scope with a camera (laparoscope) is inserted through one incision. Thin surgical instruments are  inserted through the other incisions. In some cases, a laparoscopic procedure may be turned into a type of surgery that is done through a larger incision (open surgery). Tell a health care provider about:  Any allergies you have.  All medicines you are taking, including vitamins, herbs, eye drops, creams, and over-the-counter medicines.  Any problems you or family members have had  with anesthetic medicines.  Any blood disorders you have.  Any surgeries you have had.  Any medical conditions you have.  Whether you are pregnant or may be pregnant. What are the risks? Generally, this is a safe procedure. However, problems may occur, including:  Infection.  Bleeding.  Allergic reactions to medicines.  Damage to other structures or organs.  A stone remaining in the common bile duct. The common bile duct carries bile from the gallbladder into the small intestine.  A bile leak from the cyst duct that is clipped when your gallbladder is removed.  Medicines  Ask your health care provider about: ? Changing or stopping your regular medicines. This is especially important if you are taking diabetes medicines or blood thinners. ? Taking medicines such as aspirin and ibuprofen. These medicines can thin your blood. Do not take these medicines before your procedure if your health care provider instructs you not to.  You may be given antibiotic medicine to help prevent infection. General instructions  Let your health care provider know if you develop a cold or an infection before surgery.  Plan to have someone take you home from the hospital or clinic.  Ask your health care provider how your surgical site will be marked or identified. What happens during the procedure?  To reduce your risk of infection: ? Your health care team will wash or sanitize their hands. ? Your skin will be washed with soap. ? Hair may be removed from the surgical area.  An IV tube may be inserted into one of your veins.  You will be given one or more of the following: ? A medicine to help you relax (sedative). ? A medicine to make you fall asleep (general anesthetic).  A breathing tube will be placed in your mouth.  Your surgeon will make several small cuts (incisions) in your abdomen.  The laparoscope will be inserted through one of the small incisions. The camera on the  laparoscope will send images to a TV screen (monitor) in the operating room. This lets your surgeon see inside your abdomen.  Air-like gas will be pumped into your abdomen. This will expand your abdomen to give the surgeon more room to perform the surgery.  Other tools that are needed for the procedure will be inserted through the other incisions. The gallbladder will be removed through one of the incisions.  Your common bile duct may be examined. If stones are found in the common bile duct, they may be removed.  After your gallbladder has been removed, the incisions will be closed with stitches (sutures), staples, or skin glue.  Your incisions may be covered with a bandage (dressing). The procedure may vary among health care providers and hospitals. What happens after the procedure?  Your blood pressure, heart rate, breathing rate, and blood oxygen level will be monitored until the medicines you were given have worn off.  You will be given medicines as needed to control your pain.  Do not drive for 24 hours if you were given a sedative. This information is not intended to replace advice given to you by your health  care provider. Make sure you discuss any questions you have with your health care provider. Document Released: 02/03/2005 Document Revised: 08/26/2015 Document Reviewed: 07/23/2015 Elsevier Interactive Patient Education  2018 [employer].
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
## 2017-12-24 NOTE — Patient Instructions (Signed)
[name]  12/24/2017     @PREFPERIOPPHARMACY @   Your procedure is scheduled on  01/01/2018 .  Report to [facility] at  [place].  Call this number if you have problems the morning of surgery:  [phone]   Remember:  Do not eat or drink after midnight.                        Take these medicines the morning of surgery with A SIP OF WATER  Losartan, lamictal. Follow the instructions given to you about your plavix by Dr Constance Haw.    Do not wear jewelry, make-up or nail polish.  Do not wear lotions, powders, or perfumes, or deodorant.  Do not shave 48 hours prior to surgery.  Men may shave face and neck.  Do not bring valuables to the hospital.  [facility] is not responsible for any belongings or valuables.  Contacts, dentures or bridgework may not be worn into surgery.  Leave your suitcase in the car.  After surgery it may be brought to your room.  For patients admitted to the hospital, discharge time will be determined by your treatment team.  Patients discharged the day of surgery will not be allowed to drive home.   Name and phone number of your driver:   family Special instructions:  None  Please read over the following fact sheets that you were given. Anesthesia Post-op Instructions and Care and Recovery After Surgery       Laparoscopic Cholecystectomy Laparoscopic cholecystectomy is surgery to remove the gallbladder. The gallbladder is a pear-shaped organ that lies beneath the liver on the right side of the body. The gallbladder stores bile, which is a fluid that helps the body to digest fats. Cholecystectomy is often done for inflammation of the gallbladder (cholecystitis). This condition is usually caused by a buildup of gallstones (cholelithiasis) in the gallbladder. Gallstones can block the flow of bile, which can result in inflammation and pain. In severe cases, emergency surgery may be required. This procedure is done though small  incisions in your abdomen (laparoscopic surgery). A thin scope with a camera (laparoscope) is inserted through one incision. Thin surgical instruments are inserted through the other incisions. In some cases, a laparoscopic procedure may be turned into a type of surgery that is done through a larger incision (open surgery). Tell a health care provider about:  Any allergies you have.  All medicines you are taking, including vitamins, herbs, eye drops, creams, and over-the-counter medicines.  Any problems you or family members have had with anesthetic medicines.  Any blood disorders you have.  Any surgeries you have had.  Any medical conditions you have.  Whether you are pregnant or may be pregnant. What are the risks? Generally, this is a safe procedure. However, problems may occur, including:  Infection.  Bleeding.  Allergic reactions to medicines.  Damage to other structures or organs.  A stone remaining in the common bile duct. The common bile duct carries bile from the gallbladder into the small intestine.  A bile leak from the cyst duct that is clipped when your gallbladder is removed.  What happens before the procedure? Staying hydrated Follow instructions from your health care provider about hydration, which may include:  Up to 2 hours before the procedure - you may continue to drink clear liquids, such as water, clear fruit juice, black coffee, and plain tea.  Eating and drinking restrictions Follow instructions from your health care provider about eating and drinking, which may include:  8 hours before the procedure - stop eating heavy meals or foods such as meat, fried foods, or fatty foods.  6 hours before the procedure - stop eating light meals or foods, such as toast or cereal.  6 hours before the procedure - stop drinking milk or drinks that contain milk.  2 hours before the procedure - stop drinking clear liquids.  Medicines  Ask your health care  provider about: ? Changing or stopping your regular medicines. This is especially important if you are taking diabetes medicines or blood thinners. ? Taking medicines such as aspirin and ibuprofen. These medicines can thin your blood. Do not take these medicines before your procedure if your health care provider instructs you not to.  You may be given antibiotic medicine to help prevent infection. General instructions  Let your health care provider know if you develop a cold or an infection before surgery.  Plan to have someone take you home from the hospital or clinic.  Ask your health care provider how your surgical site will be marked or identified. What happens during the procedure?  To reduce your risk of infection: ? Your health care team will wash or sanitize their hands. ? Your skin will be washed with soap. ? Hair may be removed from the surgical area.  An IV tube may be inserted into one of your veins.  You will be given one or more of the following: ? A medicine to help you relax (sedative). ? A medicine to make you fall asleep (general anesthetic).  A breathing tube will be placed in your mouth.  Your surgeon will make several small cuts (incisions) in your abdomen.  The laparoscope will be inserted through one of the small incisions. The camera on the laparoscope will send images to a TV screen (monitor) in the operating room. This lets your surgeon see inside your abdomen.  Air-like gas will be pumped into your abdomen. This will expand your abdomen to give the surgeon more room to perform the surgery.  Other tools that are needed for the procedure will be inserted through the other incisions. The gallbladder will be removed through one of the incisions.  Your common bile duct may be examined. If stones are found in the common bile duct, they may be removed.  After your gallbladder has been removed, the incisions will be closed with stitches (sutures), staples, or  skin glue.  Your incisions may be covered with a bandage (dressing). The procedure may vary among health care providers and hospitals. What happens after the procedure?  Your blood pressure, heart rate, breathing rate, and blood oxygen level will be monitored until the medicines you were given have worn off.  You will be given medicines as needed to control your pain.  Do not drive for 24 hours if you were given a sedative. This information is not intended to replace advice given to you by your health care provider. Make sure you discuss any questions you have with your health care provider. Document Released: 02/03/2005 Document Revised: 08/26/2015 Document Reviewed: 07/23/2015 Elsevier Interactive Patient Education  2018 [employer].  Laparoscopic Cholecystectomy, Care After This sheet gives you information about how to care for yourself after your procedure. Your health care provider may also give you more specific instructions. If you have problems or questions, contact your health care provider. What can I expect after the  procedure? After the procedure, it is common to have:  Pain at your incision sites. You will be given medicines to control this pain.  Mild nausea or vomiting.  Bloating and possible shoulder pain from the air-like gas that was used during the procedure.  Follow these instructions at home: Incision care   Follow instructions from your health care provider about how to take care of your incisions. Make sure you: ? Wash your hands with soap and water before you change your bandage (dressing). If soap and water are not available, use hand sanitizer. ? Change your dressing as told by your health care provider. ? Leave stitches (sutures), skin glue, or adhesive strips in place. These skin closures may need to be in place for 2 weeks or longer. If adhesive strip edges start to loosen and curl up, you may trim the loose edges. Do not remove adhesive strips  completely unless your health care provider tells you to do that.  Do not take baths, swim, or use a hot tub until your health care provider approves. Ask your health care provider if you can take showers. You may only be allowed to take sponge baths for bathing.  Check your incision area every day for signs of infection. Check for: ? More redness, swelling, or pain. ? More fluid or blood. ? Warmth. ? Pus or a bad smell. Activity  Do not drive or use heavy machinery while taking prescription pain medicine.  Do not lift anything that is heavier than 10 lb (4.5 kg) until your health care provider approves.  Do not play contact sports until your health care provider approves.  Do not drive for 24 hours if you were given a medicine to help you relax (sedative).  Rest as needed. Do not return to work or school until your health care provider approves. General instructions  Take over-the-counter and prescription medicines only as told by your health care provider.  To prevent or treat constipation while you are taking prescription pain medicine, your health care provider may recommend that you: ? Drink enough fluid to keep your urine clear or pale yellow. ? Take over-the-counter or prescription medicines. ? Eat foods that are high in fiber, such as fresh fruits and vegetables, whole grains, and beans. ? Limit foods that are high in fat and processed sugars, such as fried and sweet foods. Contact a health care provider if:  You develop a rash.  You have more redness, swelling, or pain around your incisions.  You have more fluid or blood coming from your incisions.  Your incisions feel warm to the touch.  You have pus or a bad smell coming from your incisions.  You have a fever.  One or more of your incisions breaks open. Get help right away if:  You have trouble breathing.  You have chest pain.  You have increasing pain in your shoulders.  You faint or feel dizzy when you  stand.  You have severe pain in your abdomen.  You have nausea or vomiting that lasts for more than one day.  You have leg pain. This information is not intended to replace advice given to you by your health care provider. Make sure you discuss any questions you have with your health care provider. Document Released: 02/03/2005 Document Revised: 08/25/2015 Document Reviewed: 07/23/2015 Elsevier Interactive Patient Education  2018 [place] Anesthesia, Adult General anesthesia is the use of medicines to make a person "go to sleep" (be unconscious) for a  medical procedure. General anesthesia is often recommended when a procedure:  Is long.  Requires you to be still or in an unusual position.  Is major and can cause you to lose blood.  Is impossible to do without general anesthesia.  The medicines used for general anesthesia are called general anesthetics. In addition to making you sleep, the medicines:  Prevent pain.  Control your blood pressure.  Relax your muscles.  Tell a health care provider about:  Any allergies you have.  All medicines you are taking, including vitamins, herbs, eye drops, creams, and over-the-counter medicines.  Any problems you or family members have had with anesthetic medicines.  Types of anesthetics you have had in the past.  Any bleeding disorders you have.  Any surgeries you have had.  Any medical conditions you have.  Any history of heart or lung conditions, such as heart failure, sleep apnea, or chronic obstructive pulmonary disease (COPD).  Whether you are pregnant or may be pregnant.  Whether you use tobacco, alcohol, marijuana, or street drugs.  Any history of Armed forces logistics/support/administrative officer.  Any history of depression or anxiety. What are the risks? Generally, this is a safe procedure. However, problems may occur, including:  Allergic reaction to anesthetics.  Lung and heart problems.  Inhaling food or liquids from your  stomach into your lungs (aspiration).  Injury to nerves.  Waking up during your procedure and being unable to move (rare).  Extreme agitation or a state of mental confusion (delirium) when you wake up from the anesthetic.  Air in the bloodstream, which can lead to stroke.  These problems are more likely to develop if you are having a major surgery or if you have an advanced medical condition. You can prevent some of these complications by answering all of your health care provider's questions thoroughly and by following all pre-procedure instructions. General anesthesia can cause side effects, including:  Nausea or vomiting  A sore throat from the breathing tube.  Feeling cold or shivery.  Feeling tired, washed out, or achy.  Sleepiness or drowsiness.  Confusion or agitation.  What happens before the procedure? Staying hydrated Follow instructions from your health care provider about hydration, which may include:  Up to 2 hours before the procedure - you may continue to drink clear liquids, such as water, clear fruit juice, black coffee, and plain tea.  Eating and drinking restrictions Follow instructions from your health care provider about eating and drinking, which may include:  8 hours before the procedure - stop eating heavy meals or foods such as meat, fried foods, or fatty foods.  6 hours before the procedure - stop eating light meals or foods, such as toast or cereal.  6 hours before the procedure - stop drinking milk or drinks that contain milk.  2 hours before the procedure - stop drinking clear liquids.  Medicines  Ask your health care provider about: ? Changing or stopping your regular medicines. This is especially important if you are taking diabetes medicines or blood thinners. ? Taking medicines such as aspirin and ibuprofen. These medicines can thin your blood. Do not take these medicines before your procedure if your health care provider instructs you  not to. ? Taking new dietary supplements or medicines. Do not take these during the week before your procedure unless your health care provider approves them.  If you are told to take a medicine or to continue taking a medicine on the day of the procedure, take the medicine with sips  of water. General instructions   Ask if you will be going home the same day, the following day, or after a longer hospital stay. ? Plan to have someone take you home. ? Plan to have someone stay with you for the first 24 hours after you leave the hospital or clinic.  For 3-6 weeks before the procedure, try not to use any tobacco products, such as cigarettes, chewing tobacco, and e-cigarettes.  You may brush your teeth on the morning of the procedure, but make sure to spit out the toothpaste. What happens during the procedure?  You will be given anesthetics through a mask and through an IV tube in one of your veins.  You may receive medicine to help you relax (sedative).  As soon as you are asleep, a breathing tube may be used to help you breathe.  An anesthesia specialist will stay with you throughout the procedure. He or she will help keep you comfortable and safe by continuing to give you medicines and adjusting the amount of medicine that you get. He or she will also watch your blood pressure, pulse, and oxygen levels to make sure that the anesthetics do not cause any problems.  If a breathing tube was used to help you breathe, it will be removed before you wake up. The procedure may vary among health care providers and hospitals. What happens after the procedure?  You will wake up, often slowly, after the procedure is complete, usually in a recovery area.  Your blood pressure, heart rate, breathing rate, and blood oxygen level will be monitored until the medicines you were given have worn off.  You may be given medicine to help you calm down if you feel anxious or agitated.  If you will be going  home the same day, your health care provider may check to make sure you can stand, drink, and urinate.  Your health care providers will treat your pain and side effects before you go home.  Do not drive for 24 hours if you received a sedative.  You may: ? Feel nauseous and vomit. ? Have a sore throat. ? Have mental slowness. ? Feel cold or shivery. ? Feel sleepy. ? Feel tired. ? Feel sore or achy, even in parts of your body where you did not have surgery. This information is not intended to replace advice given to you by your health care provider. Make sure you discuss any questions you have with your health care provider. Document Released: 05/13/2007 Document Revised: 07/17/2015 Document Reviewed: 01/18/2015 Elsevier Interactive Patient Education  2018 [place] Anesthesia, Adult, Care After These instructions provide you with information about caring for yourself after your procedure. Your health care provider may also give you more specific instructions. Your treatment has been planned according to current medical practices, but problems sometimes occur. Call your health care provider if you have any problems or questions after your procedure. What can I expect after the procedure? After the procedure, it is common to have:  Vomiting.  A sore throat.  Mental slowness.  It is common to feel:  Nauseous.  Cold or shivery.  Sleepy.  Tired.  Sore or achy, even in parts of your body where you did not have surgery.  Follow these instructions at home: For at least 24 hours after the procedure:  Do not: ? Participate in activities where you could fall or become injured. ? Drive. ? Use heavy machinery. ? Drink alcohol. ? Take sleeping pills or medicines that  cause drowsiness. ? Make important decisions or sign legal documents. ? Take care of children on your own.  Rest. Eating and drinking  If you vomit, drink water, juice, or soup when you can drink  without vomiting.  Drink enough fluid to keep your urine clear or pale yellow.  Make sure you have little or no nausea before eating solid foods.  Follow the diet recommended by your health care provider. General instructions  Have a responsible adult stay with you until you are awake and alert.  Return to your normal activities as told by your health care provider. Ask your health care provider what activities are safe for you.  Take over-the-counter and prescription medicines only as told by your health care provider.  If you smoke, do not smoke without supervision.  Keep all follow-up visits as told by your health care provider. This is important. Contact a health care provider if:  You continue to have nausea or vomiting at home, and medicines are not helpful.  You cannot drink fluids or start eating again.  You cannot urinate after 8-12 hours.  You develop a skin rash.  You have fever.  You have increasing redness at the site of your procedure. Get help right away if:  You have difficulty breathing.  You have chest pain.  You have unexpected bleeding.  You feel that you are having a life-threatening or urgent problem. This information is not intended to replace advice given to you by your health care provider. Make sure you discuss any questions you have with your health care provider. Document Released: 05/12/2000 Document Revised: 07/09/2015 Document Reviewed: 01/18/2015 Elsevier Interactive Patient Education  [employer].

## 2017-12-30 ENCOUNTER — Encounter (HOSPITAL_COMMUNITY): Payer: Self-pay

## 2017-12-30 ENCOUNTER — Other Ambulatory Visit: Payer: Self-pay

## 2017-12-30 ENCOUNTER — Encounter (HOSPITAL_COMMUNITY)
Admission: RE | Admit: 2017-12-30 | Discharge: 2017-12-30 | Disposition: A | Discharge status: Home / Self Care | Payer: Medicare Other | Source: Ambulatory Visit | Attending: General Surgery | Admitting: General Surgery

## 2017-12-30 DIAGNOSIS — I712 Thoracic aortic aneurysm, without rupture: Secondary | ICD-10-CM | POA: Diagnosis not present

## 2017-12-30 DIAGNOSIS — Z01818 Encounter for other preprocedural examination: Secondary | ICD-10-CM | POA: Diagnosis not present

## 2017-12-30 DIAGNOSIS — I10 Essential (primary) hypertension: Secondary | ICD-10-CM | POA: Diagnosis not present

## 2017-12-30 DIAGNOSIS — J449 Chronic obstructive pulmonary disease, unspecified: Secondary | ICD-10-CM | POA: Diagnosis not present

## 2017-12-30 DIAGNOSIS — F419 Anxiety disorder, unspecified: Secondary | ICD-10-CM | POA: Diagnosis not present

## 2017-12-30 LAB — BASIC METABOLIC PANEL
Anion gap: 8 (ref 5–15)
BUN: 16 mg/dL (ref 8–23)
CO2: 25 mmol/L (ref 22–32)
Calcium: 8.9 mg/dL (ref 8.9–10.3)
Chloride: 107 mmol/L (ref 98–111)
Creatinine, Ser: 0.73 mg/dL (ref 0.61–1.24)
GFR calc Af Amer: >60 mL/min (ref >60)
GFR calc non Af Amer: >60 mL/min (ref >60)
Glucose, Bld: 103 mg/dL — ABNORMAL HIGH (ref 70–99)
Potassium: 3.9 mmol/L (ref 3.5–5.1)
Sodium: 140 mmol/L (ref 135–145)

## 2017-12-30 LAB — CBC WITH DIFFERENTIAL/PLATELET
Abs Immature Granulocytes: 0.02 10*3/uL (ref 0.00–0.07)
Basophils Absolute: 0.1 10*3/uL (ref 0.0–0.1)
Basophils Relative: 1 %
Eosinophils Absolute: 0.1 10*3/uL (ref 0.0–0.5)
Eosinophils Relative: 2 %
HCT: 43.7 % (ref 39.0–52.0)
Hemoglobin: 14.9 g/dL (ref 13.0–17.0)
Immature Granulocytes: 0 %
Lymphocytes Relative: 26 %
Lymphs Abs: 1.6 10*3/uL (ref 0.7–4.0)
MCH: 33 pg (ref 26.0–34.0)
MCHC: 34.1 g/dL (ref 30.0–36.0)
MCV: 96.9 fL (ref 80.0–100.0)
Monocytes Absolute: 0.6 10*3/uL (ref 0.1–1.0)
Monocytes Relative: 9 %
Neutro Abs: 3.9 10*3/uL (ref 1.7–7.7)
Neutrophils Relative %: 62 %
Platelets: 211 10*3/uL (ref 150–400)
RBC: 4.51 MIL/uL (ref 4.22–5.81)
RDW: 11.7 % (ref 11.5–15.5)
WBC: 6.3 10*3/uL (ref 4.0–10.5)
nRBC: 0 % (ref 0.0–0.2)

## 2017-12-30 NOTE — Pre-Procedure Instructions (Signed)
Patient in for PAT and while going over history he tells me about a thoracic aneurysm that Dr Luan Pulling is evaluating. He has not been seen by cardiology yet and is scheduled to have Lap Choley on 01/01/2018. Spoke with Dr Currie Paris and he feels patient needs to be evaluated for aneurysm prior to surgery. Patient will go see Dr Luan Pulling now and try to proceed with surgery on 01/01/2018 as planned.

## 2017-12-31 ENCOUNTER — Telehealth: Payer: Self-pay | Admitting: General Surgery

## 2017-12-31 NOTE — Telephone Encounter (Signed)
[name]   Called patient due to patient's surgery being canceled because of anesthesia and patient's worry about his thoracic aneurysm.  The aneurysm was initially suspected in September, and looking back at his past scans appears stable.  The CTs were read by Dr. Thornton Papas, radiologist, to be stable from June to September 2019. The aneurysm is 4.2cm which is just above normal. Recommendations would be to repeat annually. The patient is worried about what will happen in the next 9 months with the aneurysm and does not want to proceed with surgery.    Patient wants to be referred to Cardiothoracic.    Dr. Luan Pulling referring him. We are happy to take care of the gallbladder if and when he decides to pursue it.  [name], MD [facility] [address] [place], [place] [address] [phone] (office)

## 2018-01-01 ENCOUNTER — Encounter (HOSPITAL_COMMUNITY): Admission: RE | Payer: Self-pay | Source: Ambulatory Visit

## 2018-01-01 ENCOUNTER — Ambulatory Visit (HOSPITAL_COMMUNITY): Admission: RE | Admit: 2018-01-01 | Payer: Medicare Other | Source: Ambulatory Visit | Admitting: General Surgery

## 2018-01-01 SURGERY — LAPAROSCOPIC CHOLECYSTECTOMY
Anesthesia: General

## 2018-01-26 DIAGNOSIS — F3341 Major depressive disorder, recurrent, in partial remission: Secondary | ICD-10-CM | POA: Diagnosis not present

## 2018-02-15 DIAGNOSIS — I712 Thoracic aortic aneurysm, without rupture: Secondary | ICD-10-CM | POA: Diagnosis not present

## 2018-02-15 DIAGNOSIS — F419 Anxiety disorder, unspecified: Secondary | ICD-10-CM | POA: Diagnosis not present

## 2018-02-15 DIAGNOSIS — I10 Essential (primary) hypertension: Secondary | ICD-10-CM | POA: Diagnosis not present

## 2018-02-15 DIAGNOSIS — F4312 Post-traumatic stress disorder, chronic: Secondary | ICD-10-CM | POA: Diagnosis not present

## 2018-02-16 ENCOUNTER — Encounter: Payer: Self-pay | Admitting: Thoracic Surgery (Cardiothoracic Vascular Surgery)

## 2018-02-16 ENCOUNTER — Other Ambulatory Visit: Payer: Self-pay

## 2018-02-16 ENCOUNTER — Institutional Professional Consult (permissible substitution) (INDEPENDENT_AMBULATORY_CARE_PROVIDER_SITE_OTHER): Payer: Medicare Other | Admitting: Thoracic Surgery (Cardiothoracic Vascular Surgery)

## 2018-02-16 VITALS — BP 127/86 | HR 46 | Resp 18 | Ht 73.0 in | Wt 235.8 lb

## 2018-02-16 DIAGNOSIS — I7121 Aneurysm of the ascending aorta, without rupture: Secondary | ICD-10-CM

## 2018-02-16 DIAGNOSIS — I700 Atherosclerosis of aorta: Secondary | ICD-10-CM | POA: Diagnosis not present

## 2018-02-16 DIAGNOSIS — I712 Thoracic aortic aneurysm, without rupture: Secondary | ICD-10-CM
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
## 2018-02-19 DIAGNOSIS — H25813 Combined forms of age-related cataract, bilateral: Secondary | ICD-10-CM | POA: Diagnosis not present

## 2018-02-19 DIAGNOSIS — H53452 Other localized visual field defect, left eye: Secondary | ICD-10-CM | POA: Diagnosis not present

## 2018-02-19 DIAGNOSIS — H35372 Puckering of macula, left eye: Secondary | ICD-10-CM | POA: Diagnosis not present

## 2018-02-19 DIAGNOSIS — H40013 Open angle with borderline findings, low risk, bilateral: Secondary | ICD-10-CM | POA: Diagnosis not present

## 2018-02-23 ENCOUNTER — Ambulatory Visit (INDEPENDENT_AMBULATORY_CARE_PROVIDER_SITE_OTHER): Payer: Medicare Other | Admitting: General Surgery

## 2018-02-23 ENCOUNTER — Encounter: Payer: Self-pay | Admitting: General Surgery

## 2018-02-23 VITALS — BP 126/67 | HR 45 | Temp 97.3°F | Resp 20 | Wt 242.0 lb

## 2018-02-23 DIAGNOSIS — K8020 Calculus of gallbladder without cholecystitis without obstruction: Secondary | ICD-10-CM

## 2018-02-23 DIAGNOSIS — K828 Other specified diseases of gallbladder: Secondary | ICD-10-CM

## 2018-02-23 MED ORDER — SULFAMETHOXAZOLE-TRIMETHOPRIM 800-160 MG PO TABS: 1.0000 | ORAL_TABLET | Freq: Two times a day (BID) | ORAL | 0 refills | Status: AC
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
## 2018-02-23 NOTE — Patient Instructions (Signed)
Laparoscopic Cholecystectomy Laparoscopic cholecystectomy is surgery to remove the gallbladder. The gallbladder is a pear-shaped organ that lies beneath the liver on the right side of the body. The gallbladder stores bile, which is a fluid that helps the body to digest fats. Cholecystectomy is often done for inflammation of the gallbladder (cholecystitis). This condition is usually caused by a buildup of gallstones (cholelithiasis) in the gallbladder. Gallstones can block the flow of bile, which can result in inflammation and pain. In severe cases, emergency surgery may be required. This procedure is done though small incisions in your abdomen (laparoscopic surgery). A thin scope with a camera (laparoscope) is inserted through one incision. Thin surgical instruments are inserted through the other incisions. In some cases, a laparoscopic procedure may be turned into a type of surgery that is done through a larger incision (open surgery). Tell a health care provider about:  Any allergies you have.  All medicines you are taking, including vitamins, herbs, eye drops, creams, and over-the-counter medicines.  Any problems you or family members have had with anesthetic medicines.  Any blood disorders you have.  Any surgeries you have had.  Any medical conditions you have.  Whether you are pregnant or may be pregnant. What are the risks? Generally, this is a safe procedure. However, problems may occur, including:  Infection.  Bleeding.  Allergic reactions to medicines.  Damage to other structures or organs.  A stone remaining in the common bile duct. The common bile duct carries bile from the gallbladder into the small intestine.  A bile leak from the cyst duct that is clipped when your gallbladder is removed.   Medicines  Ask your health care provider about: ? Changing or stopping your regular medicines. This is especially important if you are taking diabetes medicines or blood  thinners. ? Taking medicines such as aspirin and ibuprofen. These medicines can thin your blood. Do not take these medicines before your procedure if your health care provider instructs you not to.  You may be given antibiotic medicine to help prevent infection. General instructions  Let your health care provider know if you develop a cold or an infection before surgery.  Plan to have someone take you home from the hospital or clinic.  Ask your health care provider how your surgical site will be marked or identified. What happens during the procedure?   To reduce your risk of infection: ? Your health care team will wash or sanitize their hands. ? Your skin will be washed with soap. ? Hair may be removed from the surgical area.  An IV tube may be inserted into one of your veins.  You will be given one or more of the following: ? A medicine to help you relax (sedative). ? A medicine to make you fall asleep (general anesthetic).  A breathing tube will be placed in your mouth.  Your surgeon will make several small cuts (incisions) in your abdomen.  The laparoscope will be inserted through one of the small incisions. The camera on the laparoscope will send images to a TV screen (monitor) in the operating room. This lets your surgeon see inside your abdomen.  Air-like gas will be pumped into your abdomen. This will expand your abdomen to give the surgeon more room to perform the surgery.  Other tools that are needed for the procedure will be inserted through the other incisions. The gallbladder will be removed through one of the incisions.  Your common bile duct may be examined. If stones   are found in the common bile duct, they may be removed.  After your gallbladder has been removed, the incisions will be closed with stitches (sutures), staples, or skin glue.  Your incisions may be covered with a bandage (dressing). The procedure may vary among health care providers and  hospitals. What happens after the procedure?  Your blood pressure, heart rate, breathing rate, and blood oxygen level will be monitored until the medicines you were given have worn off.  You will be given medicines as needed to control your pain.  Do not drive for 24 hours if you were given a sedative. This information is not intended to replace advice given to you by your health care provider. Make sure you discuss any questions you have with your health care provider. Document Released: 02/03/2005 Document Revised: 01/01/2017 Document Reviewed: 07/23/2015 Elsevier Interactive Patient Education  2019 [employer]  
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
## 2018-02-24 DIAGNOSIS — F0631 Mood disorder due to known physiological condition with depressive features: Secondary | ICD-10-CM | POA: Diagnosis not present

## 2018-03-01 ENCOUNTER — Encounter (HOSPITAL_COMMUNITY): Payer: Self-pay

## 2018-03-02 ENCOUNTER — Encounter (HOSPITAL_COMMUNITY)
Admission: RE | Admit: 2018-03-02 | Discharge: 2018-03-02 | Disposition: A | Discharge status: Home / Self Care | Payer: Medicare Other | Source: Ambulatory Visit | Attending: General Surgery | Admitting: General Surgery

## 2018-03-05 ENCOUNTER — Ambulatory Visit (HOSPITAL_COMMUNITY): Payer: Medicare Other | Admitting: Anesthesiology

## 2018-03-05 ENCOUNTER — Ambulatory Visit (HOSPITAL_COMMUNITY)
Admission: RE | Admit: 2018-03-05 | Discharge: 2018-03-05 | Disposition: A | Discharge status: Home / Self Care | Payer: Medicare Other | Attending: General Surgery | Admitting: General Surgery

## 2018-03-05 ENCOUNTER — Encounter (HOSPITAL_COMMUNITY): Payer: Self-pay | Admitting: *Deleted

## 2018-03-05 ENCOUNTER — Encounter (HOSPITAL_COMMUNITY)
Admission: RE | Disposition: A | Discharge status: Home / Self Care | Payer: Self-pay | Source: Home / Self Care | Attending: General Surgery

## 2018-03-05 DIAGNOSIS — Z91030 Bee allergy status: Secondary | ICD-10-CM | POA: Diagnosis not present

## 2018-03-05 DIAGNOSIS — F329 Major depressive disorder, single episode, unspecified: Secondary | ICD-10-CM | POA: Insufficient documentation

## 2018-03-05 DIAGNOSIS — Z8249 Family history of ischemic heart disease and other diseases of the circulatory system: Secondary | ICD-10-CM | POA: Diagnosis not present

## 2018-03-05 DIAGNOSIS — Z87892 Personal history of anaphylaxis: Secondary | ICD-10-CM | POA: Diagnosis not present

## 2018-03-05 DIAGNOSIS — K8020 Calculus of gallbladder without cholecystitis without obstruction: Secondary | ICD-10-CM | POA: Diagnosis not present

## 2018-03-05 DIAGNOSIS — G4733 Obstructive sleep apnea (adult) (pediatric): Secondary | ICD-10-CM | POA: Insufficient documentation

## 2018-03-05 DIAGNOSIS — Z87891 Personal history of nicotine dependence: Secondary | ICD-10-CM | POA: Diagnosis not present

## 2018-03-05 DIAGNOSIS — Z8673 Personal history of transient ischemic attack (TIA), and cerebral infarction without residual deficits: Secondary | ICD-10-CM | POA: Insufficient documentation

## 2018-03-05 DIAGNOSIS — K8010 Calculus of gallbladder with chronic cholecystitis without obstruction: Secondary | ICD-10-CM | POA: Insufficient documentation

## 2018-03-05 DIAGNOSIS — M479 Spondylosis, unspecified: Secondary | ICD-10-CM | POA: Insufficient documentation

## 2018-03-05 DIAGNOSIS — I10 Essential (primary) hypertension: Secondary | ICD-10-CM | POA: Diagnosis not present

## 2018-03-05 DIAGNOSIS — I712 Thoracic aortic aneurysm, without rupture: Secondary | ICD-10-CM | POA: Diagnosis not present

## 2018-03-05 DIAGNOSIS — G8929 Other chronic pain: Secondary | ICD-10-CM | POA: Diagnosis not present

## 2018-03-05 DIAGNOSIS — Z7902 Long term (current) use of antithrombotics/antiplatelets: Secondary | ICD-10-CM | POA: Insufficient documentation

## 2018-03-05 DIAGNOSIS — J449 Chronic obstructive pulmonary disease, unspecified: Secondary | ICD-10-CM | POA: Insufficient documentation

## 2018-03-05 DIAGNOSIS — Z888 Allergy status to other drugs, medicaments and biological substances status: Secondary | ICD-10-CM | POA: Insufficient documentation

## 2018-03-05 DIAGNOSIS — Z7982 Long term (current) use of aspirin: Secondary | ICD-10-CM | POA: Insufficient documentation

## 2018-03-05 DIAGNOSIS — Z79899 Other long term (current) drug therapy: Secondary | ICD-10-CM | POA: Diagnosis not present

## 2018-03-05 DIAGNOSIS — F419 Anxiety disorder, unspecified: Secondary | ICD-10-CM | POA: Diagnosis not present

## 2018-03-05 HISTORY — PX: CHOLECYSTECTOMY: SHX55

## 2018-03-05 SURGERY — LAPAROSCOPIC CHOLECYSTECTOMY
Anesthesia: General | Site: Abdomen

## 2018-03-05 MED ORDER — HYDROMORPHONE HCL 1 MG/ML IJ SOLN: 0.2500 mg | INTRAMUSCULAR | Status: DC | PRN

## 2018-03-05 MED ORDER — CHLORHEXIDINE GLUCONATE CLOTH 2 % EX PADS: 6.0000 | MEDICATED_PAD | Freq: Once | CUTANEOUS | Status: DC

## 2018-03-05 MED ORDER — ROCURONIUM BROMIDE 10 MG/ML (PF) SYRINGE
PREFILLED_SYRINGE | INTRAVENOUS | Status: AC
  Filled 2018-03-05: qty 10

## 2018-03-05 MED ORDER — ROCURONIUM BROMIDE 100 MG/10ML IV SOLN
INTRAVENOUS | Status: DC | PRN
  Administered 2018-03-05: 20 mg via INTRAVENOUS
  Administered 2018-03-05: 60 mg via INTRAVENOUS

## 2018-03-05 MED ORDER — HYDROCODONE-ACETAMINOPHEN 7.5-325 MG PO TABS: 1.0000 | ORAL_TABLET | Freq: Once | ORAL | Status: DC | PRN

## 2018-03-05 MED ORDER — SUCCINYLCHOLINE CHLORIDE 200 MG/10ML IV SOSY
PREFILLED_SYRINGE | INTRAVENOUS | Status: AC
  Filled 2018-03-05: qty 10

## 2018-03-05 MED ORDER — EPHEDRINE 5 MG/ML INJ
INTRAVENOUS | Status: AC
  Filled 2018-03-05: qty 10

## 2018-03-05 MED ORDER — EPHEDRINE SULFATE 50 MG/ML IJ SOLN
INTRAMUSCULAR | Status: DC | PRN
  Administered 2018-03-05 (×2): 10 mg via INTRAVENOUS

## 2018-03-05 MED ORDER — SUGAMMADEX SODIUM 200 MG/2ML IV SOLN
INTRAVENOUS | Status: AC
  Filled 2018-03-05: qty 4

## 2018-03-05 MED ORDER — DOCUSATE SODIUM 100 MG PO CAPS: 100.0000 mg | ORAL_CAPSULE | Freq: Two times a day (BID) | ORAL | 2 refills | Status: DC

## 2018-03-05 MED ORDER — MIDAZOLAM HCL 2 MG/2ML IJ SOLN: 0.5000 mg | Freq: Once | INTRAMUSCULAR | Status: DC | PRN

## 2018-03-05 MED ORDER — FENTANYL CITRATE (PF) 250 MCG/5ML IJ SOLN
INTRAMUSCULAR | Status: AC
  Filled 2018-03-05: qty 5

## 2018-03-05 MED ORDER — BUPIVACAINE HCL (PF) 0.5 % IJ SOLN
INTRAMUSCULAR | Status: DC | PRN
  Administered 2018-03-05: 10 mL

## 2018-03-05 MED ORDER — SODIUM CHLORIDE 0.9 % IV SOLN
2.0000 g | INTRAVENOUS | Status: AC
  Administered 2018-03-05: 2 g via INTRAVENOUS
  Filled 2018-03-05: qty 2

## 2018-03-05 MED ORDER — LACTATED RINGERS IV SOLN
INTRAVENOUS | Status: DC
  Administered 2018-03-05: 1000 mL via INTRAVENOUS

## 2018-03-05 MED ORDER — SODIUM CHLORIDE 0.9 % IR SOLN
Status: DC | PRN
  Administered 2018-03-05: 1000 mL

## 2018-03-05 MED ORDER — SUGAMMADEX SODIUM 500 MG/5ML IV SOLN
INTRAVENOUS | Status: DC | PRN
  Administered 2018-03-05: 200 mg via INTRAVENOUS

## 2018-03-05 MED ORDER — ONDANSETRON HCL 4 MG/2ML IJ SOLN
INTRAMUSCULAR | Status: DC | PRN
  Administered 2018-03-05: 4 mg via INTRAVENOUS

## 2018-03-05 MED ORDER — PROPOFOL 10 MG/ML IV BOLUS
INTRAVENOUS | Status: AC
  Filled 2018-03-05: qty 40

## 2018-03-05 MED ORDER — HEMOSTATIC AGENTS (NO CHARGE) OPTIME
TOPICAL | Status: DC | PRN
  Administered 2018-03-05: 1 via TOPICAL

## 2018-03-05 MED ORDER — ONDANSETRON HCL 4 MG/2ML IJ SOLN
INTRAMUSCULAR | Status: AC
  Filled 2018-03-05: qty 2

## 2018-03-05 MED ORDER — FENTANYL CITRATE (PF) 100 MCG/2ML IJ SOLN
INTRAMUSCULAR | Status: DC | PRN
  Administered 2018-03-05 (×3): 50 ug via INTRAVENOUS
  Administered 2018-03-05: 25 ug via INTRAVENOUS
  Administered 2018-03-05: 50 ug via INTRAVENOUS
  Administered 2018-03-05: 25 ug via INTRAVENOUS

## 2018-03-05 MED ORDER — OXYCODONE HCL 5 MG PO TABS: 5.0000 mg | ORAL_TABLET | ORAL | 0 refills | Status: DC | PRN

## 2018-03-05 MED ORDER — MIDAZOLAM HCL 2 MG/2ML IJ SOLN
INTRAMUSCULAR | Status: DC | PRN
  Administered 2018-03-05 (×2): 1 mg via INTRAVENOUS

## 2018-03-05 MED ORDER — BUPIVACAINE HCL (PF) 0.5 % IJ SOLN
INTRAMUSCULAR | Status: AC
  Filled 2018-03-05: qty 30

## 2018-03-05 MED ORDER — PROMETHAZINE HCL 25 MG/ML IJ SOLN: 6.2500 mg | INTRAMUSCULAR | Status: DC | PRN

## 2018-03-05 MED ORDER — MIDAZOLAM HCL 2 MG/2ML IJ SOLN
INTRAMUSCULAR | Status: AC
  Filled 2018-03-05: qty 2

## 2018-03-05 MED ORDER — PROPOFOL 10 MG/ML IV BOLUS
INTRAVENOUS | Status: DC | PRN
  Administered 2018-03-05: 130 mg via INTRAVENOUS
  Administered 2018-03-05: 20 mg via INTRAVENOUS

## 2018-03-05 SURGICAL SUPPLY — 49 items
ADH SKN CLS APL DERMABOND .7 (GAUZE/BANDAGES/DRESSINGS) ×1
APPLIER CLIP ROT 10 11.4 M/L (STAPLE) ×3
APR CLP MED LRG 11.4X10 (STAPLE) ×1
BAG RETRIEVAL 10 (BASKET) ×1
BAG RETRIEVAL 10MM (BASKET) ×1
BLADE SURG 15 STRL LF DISP TIS (BLADE) ×1 IMPLANT
BLADE SURG 15 STRL SS (BLADE) ×3
CHLORAPREP W/TINT 26ML (MISCELLANEOUS) ×3 IMPLANT
CLIP APPLIE ROT 10 11.4 M/L (STAPLE) ×1 IMPLANT
CLOTH BEACON ORANGE TIMEOUT ST (SAFETY) ×3 IMPLANT
COVER LIGHT HANDLE STERIS (MISCELLANEOUS) ×6 IMPLANT
COVER WAND RF STERILE (DRAPES) ×2 IMPLANT
DECANTER SPIKE VIAL GLASS SM (MISCELLANEOUS) ×3 IMPLANT
DERMABOND ADVANCED (GAUZE/BANDAGES/DRESSINGS) ×2
DERMABOND ADVANCED .7 DNX12 (GAUZE/BANDAGES/DRESSINGS) ×1 IMPLANT
ELECT REM PT RETURN 9FT ADLT (ELECTROSURGICAL) ×3
ELECTRODE REM PT RTRN 9FT ADLT (ELECTROSURGICAL) ×1 IMPLANT
FILTER SMOKE EVAC LAPAROSHD (FILTER) ×3 IMPLANT
GLOVE BIO SURGEON STRL SZ 6.5 (GLOVE) ×2 IMPLANT
GLOVE BIO SURGEONS STRL SZ 6.5 (GLOVE) ×1
GLOVE BIOGEL PI IND STRL 6.5 (GLOVE) ×1 IMPLANT
GLOVE BIOGEL PI IND STRL 7.0 (GLOVE) ×3 IMPLANT
GLOVE BIOGEL PI INDICATOR 6.5 (GLOVE) ×2
GLOVE BIOGEL PI INDICATOR 7.0 (GLOVE) ×8
GLOVE SURG SS PI 7.5 STRL IVOR (GLOVE) ×2 IMPLANT
GOWN STRL REUS W/ TWL LRG LVL3 (GOWN DISPOSABLE) IMPLANT
GOWN STRL REUS W/TWL LRG LVL3 (GOWN DISPOSABLE) ×11 IMPLANT
HEMOSTAT SNOW SURGICEL 2X4 (HEMOSTASIS) ×3 IMPLANT
INST SET LAPROSCOPIC AP (KITS) ×3 IMPLANT
KIT TURNOVER KIT A (KITS) ×3 IMPLANT
MANIFOLD NEPTUNE II (INSTRUMENTS) ×3 IMPLANT
NDL INSUFFLATION 14GA 120MM (NEEDLE) ×1 IMPLANT
NEEDLE INSUFFLATION 14GA 120MM (NEEDLE) ×3 IMPLANT
NS IRRIG 1000ML POUR BTL (IV SOLUTION) ×3 IMPLANT
PACK LAP CHOLE LZT030E (CUSTOM PROCEDURE TRAY) ×3 IMPLANT
PAD ARMBOARD 7.5X6 YLW CONV (MISCELLANEOUS) ×3 IMPLANT
SET BASIN LINEN APH (SET/KITS/TRAYS/PACK) ×3 IMPLANT
SLEEVE ENDOPATH XCEL 5M (ENDOMECHANICALS) ×3 IMPLANT
SUT MNCRL AB 4-0 PS2 18 (SUTURE) ×7 IMPLANT
SUT VICRYL 0 UR6 27IN ABS (SUTURE) ×3 IMPLANT
SYS BAG RETRIEVAL 10MM (BASKET) ×1
SYSTEM BAG RETRIEVAL 10MM (BASKET) ×1 IMPLANT
TROCAR ENDO BLADELESS 11MM (ENDOMECHANICALS) ×3 IMPLANT
TROCAR XCEL NON-BLD 5MMX100MML (ENDOMECHANICALS) ×3 IMPLANT
TROCAR XCEL UNIV SLVE 11M 100M (ENDOMECHANICALS) ×3 IMPLANT
TUBE CONNECTING 12'X1/4 (SUCTIONS) ×1
TUBE CONNECTING 12X1/4 (SUCTIONS) ×2 IMPLANT
TUBING INSUFFLATION (TUBING) ×3 IMPLANT
WARMER LAPAROSCOPE (MISCELLANEOUS) ×3 IMPLANT

## 2018-03-05 NOTE — Anesthesia Procedure Notes (Signed)
Procedure Name: Intubation Date/Time: 03/05/2018 8:42 AM Performed by: Vista Deck, CRNA Pre-anesthesia Checklist: Patient identified, Patient being monitored, Timeout performed, Emergency Drugs available and Suction available Patient Re-evaluated:Patient Re-evaluated prior to induction Oxygen Delivery Method: Circle System Utilized Preoxygenation: Pre-oxygenation with 100% oxygen Induction Type: IV induction Ventilation: Mask ventilation without difficulty Laryngoscope Size: Mac and 3 Grade View: Grade I Tube type: Oral Tube size: 7.0 mm Number of attempts: 1 Airway Equipment and Method: stylet Placement Confirmation: ETT inserted through vocal cords under direct vision,  positive ETCO2 and breath sounds checked- equal and bilateral Secured at: 23 cm Tube secured with: Tape Dental Injury: Teeth and Oropharynx as per pre-operative assessment

## 2018-03-05 NOTE — Progress Notes (Signed)
Message left at Dr. Constance Haw office to schedule patient a follow up visit

## 2018-03-05 NOTE — Op Note (Signed)
Operative Note   Preoperative Diagnosis: Symptomatic cholelithiasis   Postoperative Diagnosis: Same   Procedure(s) Performed: Laparoscopic cholecystectomy   Surgeon: Ria Comment C. Constance Haw, MD   Assistants: Aviva Signs, MD No qualified resident was available   Anesthesia: General endotracheal   Anesthesiologist: Dr. Hilaria Ota     Specimens: Gallbladder    Estimated Blood Loss: Minimal    Blood Replacement: None    Complications: None    Operative Findings: Distended gallbladder with stones    Procedure: The patient was taken to the operating room and placed supine. General endotracheal anesthesia was induced. Intravenous antibiotics were  administered per protocol. An orogastric tube positioned to decompress the stomach. The abdomen was prepared and draped in the usual sterile fashion.    A supraumbilical incision was made and a Veress technique was utilized to achieve pneumoperitoneum to 15 mmHg with carbon dioxide. A 11 mm optiview port was placed through the supraumbilical region, and a 10 mm 0-degree operative laparoscope was introduced. The area underlying the trocar and Veress needle were inspected and without evidence of injury.  Remaining trocars were placed under direct vision. Two 5 mm ports were placed in the right abdomen, between the anterior axillary and midclavicular line.  A final 11 mm port was placed through the mid-epigastrium, near the falciform ligament.    The gallbladder fundus was elevated cephalad and the infundibulum was retracted to the patient's right. The gallbladder/cystic duct junction was skeletonized. The cystic artery noted in the triangle of Calot and was also skeletonized.  We then continued liberal medial and lateral dissection until the critical view of safety was achieved.    The cystic duct and cystic artery were doubly clipped and divided. The gallbladder was then dissected from the liver bed with electrocautery. The mesentery at the hepatic bed  was bleeding some and a clip was placed across it high on the gallbladder. The specimen was placed in an Endopouch and was retrieved through the epigastric site.   Final inspection revealed acceptable hemostasis. Surgical Emogene Morgan was placed in the gallbladder bed. Trocars were removed and pneumoperitoneum was released. The epigastric and umbilical port sites were small and the epigastric site was at the xiphoid. These were not closed.  Skin incisions were closed with 4-0 Monocryl subcuticular sutures and Dermabond. The patient was awakened from anesthesia and extubated without complication.    [name], MD [facility] [address] [place], [place] [address] [phone] (office)

## 2018-03-05 NOTE — Interval H&P Note (Signed)
History and Physical Interval Note:  03/05/2018 8:17 AM  [name]  has presented today for surgery, with the diagnosis of cholelithiasis  The various methods of treatment have been discussed with the patient and family. After consideration of risks, benefits and other options for treatment, the patient has consented to  Procedure(s) with comments: LAPAROSCOPIC CHOLECYSTECTOMY (N/A) - spoke with pt's wife she knows arrival time is 7:00 as a surgical intervention .  The patient's history has been reviewed, patient examined, no change in status, stable for surgery.  I have reviewed the patient's chart and labs.  Questions were answered to the patient's satisfaction.    No additional questions.    Virl Cagey

## 2018-03-05 NOTE — Discharge Instructions (Signed)
Discharge Laparoscopic Surgery Instructions:  Common Complaints: Right shoulder pain is common after laparoscopic surgery. This is secondary to the gas used in the surgery being trapped under the diaphragm.  Walk to help your body absorb the gas. This will improve in a few days. Pain at the port sites are common, especially the larger port sites. This will improve with time.  Some nausea is common and poor appetite. The main goal is to stay hydrated the first few days after surgery.   Diet/ Activity: Diet as tolerated. You may not have an appetite, but it is important to stay hydrated. Drink 64 ounces of water a day. Your appetite will return with time.  Shower per your regular routine daily.  Do not take hot showers. Take warm showers that are less than 10 minutes. Rest and listen to your body, but do not remain in bed all day.    Laparoscopic Cholecystectomy, Care After This sheet gives you information about how to care for yourself after your procedure. Your doctor may also give you more specific instructions. If you have problems or questions, contact your doctor. Follow these instructions at home: Care for cuts from surgery (incisions)   Follow instructions from your doctor about how to take care of your cuts from surgery. Make sure you: ? Wash your hands with soap and water before you change your bandage (dressing). If you cannot use soap and water, use hand sanitizer. ? Change your bandage as told by your doctor. ? Leave stitches (sutures), skin glue, or skin tape (adhesive) strips in place. They may need to stay in place for 2 weeks or longer. If tape strips get loose and curl up, you may trim the loose edges. Do not remove tape strips completely unless your doctor says it is okay.  Do not take baths, swim, or use a hot tub until your doctor says it is okay. Ask your doctor if you can take showers. You may only be allowed to take sponge baths for bathing.  Check your surgical cut  area every day for signs of infection. Check for: ? More redness, swelling, or pain. ? More fluid or blood. ? Warmth. ? Pus or a bad smell. Activity  Do not drive or use heavy machinery while taking prescription pain medicine.  Do not lift anything that is heavier than 10 lb (4.5 kg) until your doctor says it is okay.  Do not play contact sports until your doctor says it is okay.  Do not drive for 24 hours if you were given a medicine to help you relax (sedative).  Rest as needed. Do not return to work or school until your doctor says it is okay. General instructions  Take over-the-counter and prescription medicines only as told by your doctor.  To prevent or treat constipation while you are taking prescription pain medicine, your doctor may recommend that you: ? Drink enough fluid to keep your pee (urine) clear or pale yellow. ? Take over-the-counter or prescription medicines. ? Eat foods that are high in fiber, such as fresh fruits and vegetables, whole grains, and beans. ? Limit foods that are high in fat and processed sugars, such as fried and sweet foods. Contact a doctor if:  You develop a rash.  You have more redness, swelling, or pain around your surgical cuts.  You have more fluid or blood coming from your surgical cuts.  Your surgical cuts feel warm to the touch.  You have pus or a bad smell coming  from your surgical cuts.  You have a fever.  One or more of your surgical cuts breaks open. Get help right away if:  You have trouble breathing.  You have chest pain.  You have pain that is getting worse in your shoulders.  You faint or feel dizzy when you stand.  You have very bad pain in your belly (abdomen).  You are sick to your stomach (nauseous) for more than one day.  You have throwing up (vomiting) that lasts for more than one day.  You have leg pain. This information is not intended to replace advice given to you by your health care provider. Make  sure you discuss any questions you have with your health care provider. Document Released: 11/13/2007 Document Revised: 08/25/2015 Document Reviewed: 07/23/2015 Elsevier Interactive Patient Education  2019 [place] everyday for at least 15-20 minutes. Deep cough and move around every 1-2 hours in the first few days after surgery.  Do not lift > 10 lbs, perform excessive bending, pushing, pulling, squatting for 1-2 weeks after surgery.  Do not pick at the dermabond glue on your incision sites.  This glue film will remain in place for 1-2 weeks and will start to peel off.  Do not place lotions or balms on your incision unless instructed to specifically by Dr. Constance Haw.   Medication: Take tylenol and ibuprofen as needed for pain control, alternating every 4-6 hours.  Example:  Tylenol 1000mg  @ 6am, 12noon, 6pm, 74midnight (Do not exceed 4000mg  of tylenol a day). Ibuprofen 800mg  @ 9am, 3pm, 9pm, 3am (Do not exceed 3600mg  of ibuprofen a day).  Take Roxicodone for breakthrough pain every 4 hours.  Take Colace for constipation related to narcotic pain medication. If you do not have a bowel movement in 2 days, take Miralax over the counter.  Drink plenty of water to also prevent constipation.   Contact Information: If you have questions or concerns, please call our office, [phone], Monday- Thursday 8AM-5PM and Friday 8AM-12Noon.  If it is after hours or on the weekend, please call [facility], [phone], and ask to speak to the surgeon on call for Dr. Constance Haw at [facility].    Laparoscopic Cholecystectomy, Care After This sheet gives you information about how to care for yourself after your procedure. Your doctor may also give you more specific instructions. If you have problems or questions, contact your doctor. Follow these instructions at home: Care for cuts from surgery (incisions)   Follow instructions from your doctor about how to take care of your cuts from  surgery. Make sure you: ? Wash your hands with soap and water before you change your bandage (dressing). If you cannot use soap and water, use hand sanitizer. ? Change your bandage as told by your doctor. ? Leave stitches (sutures), skin glue, or skin tape (adhesive) strips in place. They may need to stay in place for 2 weeks or longer. If tape strips get loose and curl up, you may trim the loose edges. Do not remove tape strips completely unless your doctor says it is okay.  Do not take baths, swim, or use a hot tub until your doctor says it is okay. Ask your doctor if you can take showers. You can shower.   Check your surgical cut area every day for signs of infection. Check for: ? More redness, swelling, or pain. ? More fluid or blood. ? Warmth. ? Pus or a bad smell. Activity  Do not drive or use  heavy machinery while taking prescription pain medicine.  Do not lift anything that is heavier than 10 lb (4.5 kg) until your doctor says it is okay.  Do not play contact sports until your doctor says it is okay.  Do not drive for 24 hours if you were given a medicine to help you relax (sedative).  Rest as needed. Do not return to work or school until your doctor says it is okay. General instructions  Take over-the-counter and prescription medicines only as told by your doctor.  To prevent or treat constipation while you are taking prescription pain medicine, your doctor may recommend that you: ? Drink enough fluid to keep your pee (urine) clear or pale yellow. ? Take over-the-counter or prescription medicines. ? Eat foods that are high in fiber, such as fresh fruits and vegetables, whole grains, and beans. ? Limit foods that are high in fat and processed sugars, such as fried and sweet foods. Contact a doctor if:  You develop a rash.  You have more redness, swelling, or pain around your surgical cuts.  You have more fluid or blood coming from your surgical cuts.  Your surgical  cuts feel warm to the touch.  You have pus or a bad smell coming from your surgical cuts.  You have a fever.  One or more of your surgical cuts breaks open. Get help right away if:  You have trouble breathing.  You have chest pain.  You have pain that is getting worse in your shoulders.  You faint or feel dizzy when you stand.  You have very bad pain in your belly (abdomen).  You are sick to your stomach (nauseous) for more than one day.  You have throwing up (vomiting) that lasts for more than one day.  You have leg pain. This information is not intended to replace advice given to you by your health care provider. Make sure you discuss any questions you have with your health care provider. Document Released: 11/13/2007 Document Revised: 08/25/2015 Document Reviewed: 07/23/2015 Elsevier Interactive Patient Education  2019 [place] Anesthesia, Adult, Care After This sheet gives you information about how to care for yourself after your procedure. Your health care provider may also give you more specific instructions. If you have problems or questions, contact your health care provider. What can I expect after the procedure? After the procedure, the following side effects are common:  Pain or discomfort at the IV site.  Nausea.  Vomiting.  Sore throat.  Trouble concentrating.  Feeling cold or chills.  Weak or tired.  Sleepiness and fatigue.  Soreness and body aches. These side effects can affect parts of the body that were not involved in surgery. Follow these instructions at home:  For at least 24 hours after the procedure:  Have a responsible adult stay with you. It is important to have someone help care for you until you are awake and alert.  Rest as needed.  Do not: ? Participate in activities in which you could fall or become injured. ? Drive. ? Use heavy machinery. ? Drink alcohol. ? Take sleeping pills or medicines that cause  drowsiness. ? Make important decisions or sign legal documents. ? Take care of children on your own. Eating and drinking  Follow any instructions from your health care provider about eating or drinking restrictions.  When you feel hungry, start by eating small amounts of foods that are soft and easy to digest (bland), such as toast. Gradually return  to your regular diet.  Drink enough fluid to keep your urine pale yellow.  If you vomit, rehydrate by drinking water, juice, or clear broth. General instructions  If you have sleep apnea, surgery and certain medicines can increase your risk for breathing problems. Follow instructions from your health care provider about wearing your sleep device: ? Anytime you are sleeping, including during daytime naps. ? While taking prescription pain medicines, sleeping medicines, or medicines that make you drowsy.  Return to your normal activities as told by your health care provider. Ask your health care provider what activities are safe for you.  Take over-the-counter and prescription medicines only as told by your health care provider.  If you smoke, do not smoke without supervision.  Keep all follow-up visits as told by your health care provider. This is important. Contact a health care provider if:  You have nausea or vomiting that does not get better with medicine.  You cannot eat or drink without vomiting.  You have pain that does not get better with medicine.  You are unable to pass urine.  You develop a skin rash.  You have a fever.  You have redness around your IV site that gets worse. Get help right away if:  You have difficulty breathing.  You have chest pain.  You have blood in your urine or stool, or you vomit blood. Summary  After the procedure, it is common to have a sore throat or nausea. It is also common to feel tired.  Have a responsible adult stay with you for the first 24 hours after general anesthesia. It is  important to have someone help care for you until you are awake and alert.  When you feel hungry, start by eating small amounts of foods that are soft and easy to digest (bland), such as toast. Gradually return to your regular diet.  Drink enough fluid to keep your urine pale yellow.  Return to your normal activities as told by your health care provider. Ask your health care provider what activities are safe for you. This information is not intended to replace advice given to you by your health care provider. Make sure you discuss any questions you have with your health care provider. Document Released: 05/12/2000 Document Revised: 09/19/2016 Document Reviewed: 09/19/2016 Elsevier Interactive Patient Education  2019 [employer].

## 2018-03-05 NOTE — Anesthesia Preprocedure Evaluation (Addendum)
Anesthesia Evaluation  Patient identified by MRN, date of birth, ID band Patient awake    Reviewed: Allergy & Precautions, NPO status , Patient's Chart, lab work & pertinent test results, reviewed documented beta blocker date and time   Airway Mallampati: I  TM Distance: >3 FB Neck ROM: Full    Dental no notable dental hx. (+) Teeth Intact   Pulmonary shortness of breath, neg sleep apnea, pneumonia, former smoker,  States was told had OSA - now off CPAP for years -denies current OSA   Pulmonary exam normal breath sounds clear to auscultation       Cardiovascular Exercise Tolerance: Good hypertension, Normal cardiovascular examI Rhythm:Regular Rate:Normal  States stable Ascending Aortic A. Which is stable ~4cm   Neuro/Psych  Headaches, Anxiety Depression Had EToH issues states none for 3 years States 4 cvas in the past -s/p PFO closure in 2010 - states almost completely resolved L weakness -walks without assist -states ~1 mile   Neuromuscular disease CVA negative psych ROS   GI/Hepatic negative GI ROS, Neg liver ROS,   Endo/Other  negative endocrine ROS  Renal/GU negative Renal ROS  negative genitourinary   Musculoskeletal  (+) Arthritis ,   Abdominal   Peds negative pediatric ROS (+)  Hematology negative hematology ROS (+)   Anesthesia Other Findings   Reproductive/Obstetrics negative OB ROS                            Anesthesia Physical Anesthesia Plan  ASA: III  Anesthesia Plan: General   Post-op Pain Management:    Induction: Intravenous  PONV Risk Score and Plan:   Airway Management Planned: Oral ETT  Additional Equipment:   Intra-op Plan:   Post-operative Plan: Extubation in OR  Informed Consent: I have reviewed the patients History and Physical, chart, labs and discussed the procedure including the risks, benefits and alternatives for the proposed anesthesia with the  patient or authorized representative who has indicated his/her understanding and acceptance.     Dental advisory given  Plan Discussed with: CRNA  Anesthesia Plan Comments:         Anesthesia Quick Evaluation

## 2018-03-05 NOTE — Transfer of Care (Signed)
Immediate Anesthesia Transfer of Care Note  Patient: [name]  Procedure(s) Performed: LAPAROSCOPIC CHOLECYSTECTOMY (N/A Abdomen)  Patient Location: PACU  Anesthesia Type:General  Level of Consciousness: awake and patient cooperative  Airway & Oxygen Therapy: Patient Spontanous Breathing and non-rebreather face mask  Post-op Assessment: Report given to RN and Post -op Vital signs reviewed and stable  Post vital signs: Reviewed and stable  Last Vitals:  Vitals Value Taken Time  BP    Temp    Pulse 62 03/05/2018  9:31 AM  Resp 12 03/05/2018  9:31 AM  SpO2 99 % 03/05/2018  9:31 AM  Vitals shown include unvalidated device data.  Last Pain:  Vitals:   03/05/18 0729  TempSrc: Oral  PainSc: 2       Patients Stated Pain Goal: 8 (81/38/87 1959)  Complications: No apparent anesthesia complications

## 2018-03-05 NOTE — Anesthesia Postprocedure Evaluation (Signed)
Anesthesia Post Note  Patient: [name]  Procedure(s) Performed: LAPAROSCOPIC CHOLECYSTECTOMY (N/A Abdomen)  Patient location during evaluation: PACU Anesthesia Type: General Level of consciousness: awake and alert and patient cooperative Pain management: satisfactory to patient Vital Signs Assessment: post-procedure vital signs reviewed and stable Respiratory status: spontaneous breathing Cardiovascular status: stable Postop Assessment: no apparent nausea or vomiting and adequate PO intake Anesthetic complications: no     Last Vitals:  Vitals:   03/05/18 0945 03/05/18 0950  BP: 112/69   Pulse: (!) 59 (!) 52  Resp: (!) 22 20  Temp:    SpO2: 94% 96%    Last Pain:  Vitals:   03/05/18 0930  TempSrc:   PainSc: 5                  Shakedra Beam

## 2018-03-08 ENCOUNTER — Encounter (HOSPITAL_COMMUNITY): Payer: Self-pay | Admitting: General Surgery

## 2018-03-11 DIAGNOSIS — F0631 Mood disorder due to known physiological condition with depressive features: Secondary | ICD-10-CM | POA: Diagnosis not present

## 2018-03-16 DIAGNOSIS — F3341 Major depressive disorder, recurrent, in partial remission: Secondary | ICD-10-CM | POA: Diagnosis not present

## 2018-03-18 ENCOUNTER — Ambulatory Visit (INDEPENDENT_AMBULATORY_CARE_PROVIDER_SITE_OTHER): Payer: Self-pay | Admitting: General Surgery

## 2018-03-18 ENCOUNTER — Encounter: Payer: Self-pay | Admitting: General Surgery

## 2018-03-18 VITALS — BP 140/74 | HR 50 | Temp 97.3°F | Resp 18 | Wt 242.2 lb

## 2018-03-18 DIAGNOSIS — K828 Other specified diseases of gallbladder: Secondary | ICD-10-CM

## 2018-03-18 DIAGNOSIS — K8020 Calculus of gallbladder without cholecystitis without obstruction: Secondary | ICD-10-CM

## 2018-03-18 NOTE — Patient Instructions (Signed)
Activity and diet as tolerated.    

## 2018-03-18 NOTE — Progress Notes (Signed)
[facility] Note   HPI:  69 y.o. Male presents to clinic for post-op follow-up evaluation after a lap chole. Patient reports he is doing well. the pain is improved.  Review of Systems:  No fever or chills Regular Bms Regular diet All other review of systems: otherwise negative   Pathology: Diagnosis Gallbladder - CHRONIC CHOLECYSTITIS AND CHOLELITHIASIS.  Vital Signs:  BP 140/74 (BP Location: Left Arm, Patient Position: Sitting, Cuff Size: Large)   Pulse (!) 50   Temp (!) 97.3 F (36.3 C) (Temporal)   Resp 18   Wt 242 lb 3.2 oz (109.9 kg)   BMI 31.95 kg/m    Physical Exam:  Physical Exam Cardiovascular:     Rate and Rhythm: Normal rate.  Pulmonary:     Effort: Pulmonary effort is normal.  Abdominal:     General: There is no distension.     Palpations: Abdomen is soft.     Tenderness: There is no abdominal tenderness.     Comments: Port sites healing, no erythema or drainage     Assessment:  68 y.o. yo Male s/p Lap chole. Doing well.  Plan:  - Activity and diet as tolerated  - Follow up PRN   All of the above recommendations were discussed with the patient, and all of patient's questions were answered to his expressed satisfaction.  [name], MD [facility] [address] [place], [place] [address] [phone] (office)

## 2018-03-25 DIAGNOSIS — F0631 Mood disorder due to known physiological condition with depressive features: Secondary | ICD-10-CM | POA: Diagnosis not present

## 2018-03-29 DIAGNOSIS — I10 Essential (primary) hypertension: Secondary | ICD-10-CM | POA: Diagnosis not present

## 2018-03-29 DIAGNOSIS — I712 Thoracic aortic aneurysm, without rupture: Secondary | ICD-10-CM | POA: Diagnosis not present

## 2018-03-29 DIAGNOSIS — F4312 Post-traumatic stress disorder, chronic: Secondary | ICD-10-CM | POA: Diagnosis not present

## 2018-03-29 DIAGNOSIS — J449 Chronic obstructive pulmonary disease, unspecified: Secondary | ICD-10-CM | POA: Diagnosis not present

## 2018-04-08 DIAGNOSIS — F0631 Mood disorder due to known physiological condition with depressive features: Secondary | ICD-10-CM | POA: Diagnosis not present

## 2018-04-20 DIAGNOSIS — F5104 Psychophysiologic insomnia: Secondary | ICD-10-CM | POA: Insufficient documentation

## 2018-04-20 DIAGNOSIS — F3341 Major depressive disorder, recurrent, in partial remission: Secondary | ICD-10-CM | POA: Diagnosis not present

## 2018-04-22 DIAGNOSIS — F0631 Mood disorder due to known physiological condition with depressive features: Secondary | ICD-10-CM | POA: Diagnosis not present

## 2018-05-27 DIAGNOSIS — M25511 Pain in right shoulder: Secondary | ICD-10-CM | POA: Diagnosis not present

## 2018-05-27 DIAGNOSIS — F0631 Mood disorder due to known physiological condition with depressive features: Secondary | ICD-10-CM | POA: Diagnosis not present

## 2018-05-27 DIAGNOSIS — J449 Chronic obstructive pulmonary disease, unspecified: Secondary | ICD-10-CM | POA: Diagnosis not present

## 2018-05-27 DIAGNOSIS — I10 Essential (primary) hypertension: Secondary | ICD-10-CM | POA: Diagnosis not present

## 2018-05-28 ENCOUNTER — Other Ambulatory Visit (HOSPITAL_COMMUNITY): Payer: Self-pay | Admitting: Pulmonary Disease

## 2018-05-28 DIAGNOSIS — M542 Cervicalgia: Secondary | ICD-10-CM

## 2018-06-10 DIAGNOSIS — F0631 Mood disorder due to known physiological condition with depressive features: Secondary | ICD-10-CM | POA: Diagnosis not present

## 2018-06-17 DIAGNOSIS — F0631 Mood disorder due to known physiological condition with depressive features: Secondary | ICD-10-CM | POA: Diagnosis not present

## 2018-06-24 DIAGNOSIS — F0631 Mood disorder due to known physiological condition with depressive features: Secondary | ICD-10-CM | POA: Diagnosis not present

## 2018-06-30 DIAGNOSIS — F0631 Mood disorder due to known physiological condition with depressive features: Secondary | ICD-10-CM | POA: Diagnosis not present

## 2018-07-14 DIAGNOSIS — F0631 Mood disorder due to known physiological condition with depressive features: Secondary | ICD-10-CM | POA: Diagnosis not present

## 2018-07-19 ENCOUNTER — Other Ambulatory Visit: Payer: Self-pay

## 2018-07-19 ENCOUNTER — Ambulatory Visit (HOSPITAL_COMMUNITY)
Admission: RE | Admit: 2018-07-19 | Discharge: 2018-07-19 | Disposition: A | Discharge status: Home / Self Care | Payer: Medicare Other | Source: Ambulatory Visit | Attending: Pulmonary Disease | Admitting: Pulmonary Disease

## 2018-07-19 DIAGNOSIS — M542 Cervicalgia: Secondary | ICD-10-CM | POA: Diagnosis not present

## 2018-07-27 DIAGNOSIS — M4722 Other spondylosis with radiculopathy, cervical region: Secondary | ICD-10-CM | POA: Diagnosis not present

## 2018-07-27 DIAGNOSIS — M542 Cervicalgia: Secondary | ICD-10-CM | POA: Diagnosis not present

## 2018-07-27 DIAGNOSIS — M5489 Other dorsalgia: Secondary | ICD-10-CM | POA: Diagnosis not present

## 2018-07-27 DIAGNOSIS — I10 Essential (primary) hypertension: Secondary | ICD-10-CM | POA: Diagnosis not present

## 2018-07-27 DIAGNOSIS — Z6831 Body mass index (BMI) 31.0-31.9, adult: Secondary | ICD-10-CM | POA: Diagnosis not present

## 2018-07-27 DIAGNOSIS — M9971 Connective tissue and disc stenosis of intervertebral foramina of cervical region: Secondary | ICD-10-CM | POA: Diagnosis not present

## 2018-07-27 DIAGNOSIS — R200 Anesthesia of skin: Secondary | ICD-10-CM | POA: Diagnosis not present

## 2018-07-29 DIAGNOSIS — F0631 Mood disorder due to known physiological condition with depressive features: Secondary | ICD-10-CM | POA: Diagnosis not present

## 2018-08-05 DIAGNOSIS — F0631 Mood disorder due to known physiological condition with depressive features: Secondary | ICD-10-CM | POA: Diagnosis not present

## 2018-08-09 DIAGNOSIS — M9971 Connective tissue and disc stenosis of intervertebral foramina of cervical region: Secondary | ICD-10-CM | POA: Diagnosis not present

## 2018-08-09 DIAGNOSIS — M542 Cervicalgia: Secondary | ICD-10-CM | POA: Diagnosis not present

## 2018-08-09 DIAGNOSIS — G5603 Carpal tunnel syndrome, bilateral upper limbs: Secondary | ICD-10-CM | POA: Diagnosis not present

## 2018-08-12 DIAGNOSIS — F0631 Mood disorder due to known physiological condition with depressive features: Secondary | ICD-10-CM | POA: Diagnosis not present

## 2018-08-19 DIAGNOSIS — F0631 Mood disorder due to known physiological condition with depressive features: Secondary | ICD-10-CM | POA: Diagnosis not present

## 2018-09-01 ENCOUNTER — Ambulatory Visit (HOSPITAL_COMMUNITY)
Admission: RE | Admit: 2018-09-01 | Discharge: 2018-09-01 | Disposition: A | Discharge status: Home / Self Care | Payer: Medicare Other | Source: Ambulatory Visit | Attending: Pulmonary Disease | Admitting: Pulmonary Disease

## 2018-09-01 ENCOUNTER — Other Ambulatory Visit: Payer: Self-pay | Admitting: Pulmonary Disease

## 2018-09-01 ENCOUNTER — Other Ambulatory Visit: Payer: Self-pay

## 2018-09-01 ENCOUNTER — Other Ambulatory Visit (HOSPITAL_COMMUNITY): Payer: Self-pay | Admitting: Pulmonary Disease

## 2018-09-01 DIAGNOSIS — G5600 Carpal tunnel syndrome, unspecified upper limb: Secondary | ICD-10-CM | POA: Diagnosis not present

## 2018-09-01 DIAGNOSIS — R600 Localized edema: Secondary | ICD-10-CM | POA: Diagnosis not present

## 2018-09-01 DIAGNOSIS — R2242 Localized swelling, mass and lump, left lower limb: Secondary | ICD-10-CM | POA: Insufficient documentation

## 2018-09-01 DIAGNOSIS — I10 Essential (primary) hypertension: Secondary | ICD-10-CM | POA: Diagnosis not present

## 2018-09-01 DIAGNOSIS — J449 Chronic obstructive pulmonary disease, unspecified: Secondary | ICD-10-CM | POA: Diagnosis not present

## 2018-09-02 ENCOUNTER — Other Ambulatory Visit (HOSPITAL_COMMUNITY): Payer: Self-pay | Admitting: Pulmonary Disease

## 2018-09-02 ENCOUNTER — Other Ambulatory Visit: Payer: Self-pay | Admitting: Pulmonary Disease

## 2018-09-02 DIAGNOSIS — R2242 Localized swelling, mass and lump, left lower limb: Secondary | ICD-10-CM

## 2018-09-02 DIAGNOSIS — F0631 Mood disorder due to known physiological condition with depressive features: Secondary | ICD-10-CM | POA: Diagnosis not present

## 2018-09-03 ENCOUNTER — Ambulatory Visit (HOSPITAL_COMMUNITY): Payer: Medicare Other

## 2018-09-03 ENCOUNTER — Ambulatory Visit (HOSPITAL_COMMUNITY)
Admission: RE | Admit: 2018-09-03 | Discharge: 2018-09-03 | Disposition: A | Discharge status: Home / Self Care | Payer: Medicare Other | Source: Ambulatory Visit | Attending: Pulmonary Disease | Admitting: Pulmonary Disease

## 2018-09-03 ENCOUNTER — Other Ambulatory Visit: Payer: Self-pay

## 2018-09-03 DIAGNOSIS — R2242 Localized swelling, mass and lump, left lower limb: Secondary | ICD-10-CM | POA: Diagnosis not present

## 2018-09-09 ENCOUNTER — Ambulatory Visit (HOSPITAL_COMMUNITY): Payer: Medicare Other

## 2018-09-09 DIAGNOSIS — F0631 Mood disorder due to known physiological condition with depressive features: Secondary | ICD-10-CM | POA: Diagnosis not present

## 2018-09-17 ENCOUNTER — Other Ambulatory Visit: Payer: Self-pay

## 2018-09-23 DIAGNOSIS — F0631 Mood disorder due to known physiological condition with depressive features: Secondary | ICD-10-CM | POA: Diagnosis not present

## 2018-10-01 ENCOUNTER — Other Ambulatory Visit: Payer: Self-pay | Admitting: *Deleted

## 2018-10-01 DIAGNOSIS — I712 Thoracic aortic aneurysm, without rupture, unspecified: Secondary | ICD-10-CM

## 2018-10-06 DIAGNOSIS — F0631 Mood disorder due to known physiological condition with depressive features: Secondary | ICD-10-CM | POA: Diagnosis not present

## 2018-10-07 ENCOUNTER — Other Ambulatory Visit: Payer: Self-pay | Admitting: *Deleted

## 2018-10-07 DIAGNOSIS — I712 Thoracic aortic aneurysm, without rupture, unspecified: Secondary | ICD-10-CM

## 2018-10-07 NOTE — Progress Notes (Signed)
Ct a 

## 2018-10-12 ENCOUNTER — Other Ambulatory Visit: Payer: Self-pay | Admitting: Pulmonary Disease

## 2018-10-12 ENCOUNTER — Other Ambulatory Visit (HOSPITAL_COMMUNITY): Payer: Self-pay | Admitting: Pulmonary Disease

## 2018-10-12 DIAGNOSIS — I714 Abdominal aortic aneurysm, without rupture, unspecified: Secondary | ICD-10-CM

## 2018-10-13 DIAGNOSIS — F3341 Major depressive disorder, recurrent, in partial remission: Secondary | ICD-10-CM | POA: Diagnosis not present

## 2018-10-19 DIAGNOSIS — F0631 Mood disorder due to known physiological condition with depressive features: Secondary | ICD-10-CM | POA: Diagnosis not present

## 2018-10-29 DIAGNOSIS — G5603 Carpal tunnel syndrome, bilateral upper limbs: Secondary | ICD-10-CM | POA: Diagnosis not present

## 2018-10-29 DIAGNOSIS — I10 Essential (primary) hypertension: Secondary | ICD-10-CM | POA: Diagnosis not present

## 2018-10-29 DIAGNOSIS — Z6832 Body mass index (BMI) 32.0-32.9, adult: Secondary | ICD-10-CM | POA: Diagnosis not present

## 2018-10-29 DIAGNOSIS — R200 Anesthesia of skin: Secondary | ICD-10-CM | POA: Diagnosis not present

## 2018-10-29 DIAGNOSIS — M9971 Connective tissue and disc stenosis of intervertebral foramina of cervical region: Secondary | ICD-10-CM | POA: Diagnosis not present

## 2018-11-03 ENCOUNTER — Ambulatory Visit (HOSPITAL_COMMUNITY)
Admission: RE | Admit: 2018-11-03 | Discharge: 2018-11-03 | Disposition: A | Discharge status: Home / Self Care | Payer: Medicare Other | Source: Ambulatory Visit | Attending: Thoracic Surgery (Cardiothoracic Vascular Surgery) | Admitting: Thoracic Surgery (Cardiothoracic Vascular Surgery)

## 2018-11-03 ENCOUNTER — Other Ambulatory Visit: Payer: Self-pay

## 2018-11-03 DIAGNOSIS — I714 Abdominal aortic aneurysm, without rupture, unspecified: Secondary | ICD-10-CM

## 2018-11-03 DIAGNOSIS — I712 Thoracic aortic aneurysm, without rupture, unspecified: Secondary | ICD-10-CM

## 2018-11-03 LAB — POCT I-STAT CREATININE: Creatinine, Ser: 1 mg/dL (ref 0.61–1.24)

## 2018-11-03 MED ORDER — IOHEXOL 350 MG/ML SOLN
100.0000 mL | Freq: Once | INTRAVENOUS | Status: AC | PRN
  Administered 2018-11-03: 100 mL via INTRAVENOUS

## 2018-11-04 DIAGNOSIS — F0631 Mood disorder due to known physiological condition with depressive features: Secondary | ICD-10-CM | POA: Diagnosis not present

## 2018-11-09 ENCOUNTER — Ambulatory Visit: Payer: Medicare Other | Admitting: Thoracic Surgery (Cardiothoracic Vascular Surgery)

## 2018-11-10 DIAGNOSIS — Z23 Encounter for immunization: Secondary | ICD-10-CM | POA: Diagnosis not present

## 2018-11-16 ENCOUNTER — Other Ambulatory Visit: Payer: Self-pay

## 2018-11-16 ENCOUNTER — Encounter: Payer: Self-pay | Admitting: Thoracic Surgery (Cardiothoracic Vascular Surgery)

## 2018-11-16 ENCOUNTER — Ambulatory Visit (INDEPENDENT_AMBULATORY_CARE_PROVIDER_SITE_OTHER): Payer: Medicare Other | Admitting: Thoracic Surgery (Cardiothoracic Vascular Surgery)

## 2018-11-16 DIAGNOSIS — I712 Thoracic aortic aneurysm, without rupture, unspecified: Secondary | ICD-10-CM | POA: Insufficient documentation
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
## 2018-11-18 DIAGNOSIS — F0631 Mood disorder due to known physiological condition with depressive features: Secondary | ICD-10-CM | POA: Diagnosis not present

## 2018-11-24 DIAGNOSIS — F4312 Post-traumatic stress disorder, chronic: Secondary | ICD-10-CM | POA: Diagnosis not present

## 2018-11-30 ENCOUNTER — Other Ambulatory Visit: Payer: Self-pay | Admitting: Neurosurgery

## 2018-12-02 DIAGNOSIS — F0631 Mood disorder due to known physiological condition with depressive features: Secondary | ICD-10-CM | POA: Diagnosis not present

## 2018-12-16 DIAGNOSIS — F0631 Mood disorder due to known physiological condition with depressive features: Secondary | ICD-10-CM | POA: Diagnosis not present

## 2018-12-23 ENCOUNTER — Other Ambulatory Visit: Payer: Self-pay | Admitting: Neurosurgery

## 2018-12-23 DIAGNOSIS — D225 Melanocytic nevi of trunk: Secondary | ICD-10-CM | POA: Diagnosis not present

## 2018-12-23 DIAGNOSIS — L821 Other seborrheic keratosis: Secondary | ICD-10-CM | POA: Diagnosis not present

## 2018-12-23 DIAGNOSIS — D485 Neoplasm of uncertain behavior of skin: Secondary | ICD-10-CM | POA: Diagnosis not present

## 2018-12-23 DIAGNOSIS — D2262 Melanocytic nevi of left upper limb, including shoulder: Secondary | ICD-10-CM | POA: Diagnosis not present

## 2018-12-23 DIAGNOSIS — Z1283 Encounter for screening for malignant neoplasm of skin: Secondary | ICD-10-CM | POA: Diagnosis not present

## 2018-12-28 ENCOUNTER — Encounter (HOSPITAL_COMMUNITY): Payer: Self-pay

## 2018-12-28 ENCOUNTER — Other Ambulatory Visit: Payer: Self-pay

## 2018-12-28 ENCOUNTER — Encounter (HOSPITAL_COMMUNITY)
Admission: RE | Admit: 2018-12-28 | Discharge: 2018-12-28 | Disposition: A | Discharge status: Home / Self Care | Payer: Medicare Other | Source: Ambulatory Visit | Attending: Neurosurgery | Admitting: Neurosurgery

## 2018-12-28 DIAGNOSIS — Z01812 Encounter for preprocedural laboratory examination: Secondary | ICD-10-CM | POA: Insufficient documentation

## 2018-12-28 HISTORY — DX: Abdominal aortic aneurysm, without rupture: I71.4

## 2018-12-28 HISTORY — DX: Carpal tunnel syndrome, unspecified upper limb: G56.00

## 2018-12-28 HISTORY — DX: Chronic obstructive pulmonary disease, unspecified: J44.9

## 2018-12-28 HISTORY — DX: Abdominal aortic aneurysm, without rupture, unspecified: I71.40

## 2018-12-28 HISTORY — DX: Essential (primary) hypertension: I10

## 2018-12-28 LAB — BASIC METABOLIC PANEL
Anion gap: 10 (ref 5–15)
BUN: 23 mg/dL (ref 8–23)
CO2: 25 mmol/L (ref 22–32)
Calcium: 9.6 mg/dL (ref 8.9–10.3)
Chloride: 106 mmol/L (ref 98–111)
Creatinine, Ser: 0.9 mg/dL (ref 0.61–1.24)
GFR calc Af Amer: >60 mL/min (ref >60)
GFR calc non Af Amer: >60 mL/min (ref >60)
Glucose, Bld: 115 mg/dL — ABNORMAL HIGH (ref 70–99)
Potassium: 4.2 mmol/L (ref 3.5–5.1)
Sodium: 141 mmol/L (ref 135–145)

## 2018-12-28 LAB — CBC
HCT: 43.2 % (ref 39.0–52.0)
Hemoglobin: 14.9 g/dL (ref 13.0–17.0)
MCH: 32.7 pg (ref 26.0–34.0)
MCHC: 34.5 g/dL (ref 30.0–36.0)
MCV: 94.9 fL (ref 80.0–100.0)
Platelets: 237 10*3/uL (ref 150–400)
RBC: 4.55 MIL/uL (ref 4.22–5.81)
RDW: 11.7 % (ref 11.5–15.5)
WBC: 6.6 10*3/uL (ref 4.0–10.5)
nRBC: 0 % (ref 0.0–0.2)

## 2018-12-28 LAB — TYPE AND SCREEN
ABO/RH(D): A POS
Antibody Screen: NEGATIVE

## 2018-12-28 LAB — ABO/RH: ABO/RH(D): A POS

## 2018-12-28 LAB — SURGICAL PCR SCREEN
MRSA, PCR: NEGATIVE
Staphylococcus aureus: NEGATIVE

## 2018-12-28 NOTE — Progress Notes (Addendum)
PCP - Dr. Luan Pulling Cardiologist - Dr. Johnsie Cancel (has not seen in a "couple of years") last visit in [place] was in 2015  PPM/ICD - N/A Device Orders -  Rep Notified -   Chest x-ray - N/A EKG - 09/28/18 Stress Test - denies ECHO - 12/18/14 Cardiac Cath - 2010 (CE)  Sleep Study - unsure CPAP - no - states he has a mild case of sleep apnea  Fasting Blood Sugar - N/A Checks Blood Sugar _____ times a day  Blood Thinner Instructions:Instructed to hold 7 days prior to surgery Aspirin Instructions:Instructed to hold 7 days prior to surgery   ERAS Protcol - N/A PRE-SURGERY Ensure or G2-   COVID TEST- scheduled for 01/03/19  Pt had a small mole removed from his left scapula last week, states it's healing well.   Anesthesia review: Yes, hx of AAA and TAA, strokes, PFO (has been surgically repaired)  Patient denies shortness of breath, fever, cough and chest pain at PAT appointment   All instructions explained to the patient, with a verbal understanding of the material. Patient agrees to go over the instructions while at home for a better understanding. Patient also instructed to self quarantine after being tested for COVID-19. The opportunity to ask questions was provided.

## 2018-12-28 NOTE — Pre-Procedure Instructions (Signed)
[name]  12/28/2018    Your procedure is scheduled on Wednesday, January 05, 2019 at 8:30 AM.   Report to [facility] Entrance "A" Admitting Office at 6:30 AM.   Call this number if you have problems the morning of surgery: [phone]   Questions prior to day of surgery, please call [phone] between 8 & 4 PM.   Remember:  Do not eat or drink after midnight Tuesday, 01/04/19.  Take these medicines the morning of surgery with A SIP OF WATER: Lamotrigine (Lamictal), Metoprolol (Toprol XL)  Stop Aspirin and Plavix as instructed by surgeon/physician. Stop NSAIDS (Naproxen, Aleve, Ibuprofen, etc), Fish Oil and Herbal Medications 7 days prior to surgery. Do not use other Aspirin products (Goody's, BC Powders, etc) prior to surgery.    Do not wear jewelry.  Do not wear lotions, powders, cologne or deodorant.  Men may shave face and neck.  Do not bring valuables to the hospital.  [facility] is not responsible for any belongings or valuables.  Contacts, dentures or bridgework may not be worn into surgery.  Leave your suitcase in the car.  After surgery it may be brought to your room.  For patients admitted to the hospital, discharge time will be determined by your treatment team.  [facility] - Preparing for Surgery  Before surgery, you can play an important role.  Because skin is not sterile, your skin needs to be as free of germs as possible.  You can reduce the number of germs on you skin by washing with CHG (chlorahexidine gluconate) soap before surgery.  CHG is an antiseptic cleaner which kills germs and bonds with the skin to continue killing germs even after washing.  Oral Hygiene is also important in reducing the risk of infection.  Remember to brush your teeth with your regular toothpaste the morning of surgery.  Please DO NOT use if you have an allergy to CHG or antibacterial soaps.  If your skin becomes reddened/irritated stop using the CHG and inform  your nurse when you arrive at Short Stay.  Do not shave (including legs and underarms) for at least 48 hours prior to the first CHG shower.  You may shave your face.  Please follow these instructions carefully:   1.  Shower with CHG Soap the night before surgery and the morning of Surgery.  2.  If you choose to wash your hair, wash your hair first as usual with your normal shampoo.  3.  After you shampoo, rinse your hair and body thoroughly to remove the shampoo. 4.  Use CHG as you would any other liquid soap.  You can apply chg directly to the skin and wash gently with a      scrungie or washcloth.           5.  Apply the CHG Soap to your body ONLY FROM THE NECK DOWN.   Do not use on open wounds or open sores. Avoid contact with your eyes, ears, mouth and genitals (private parts).  Wash genitals (private parts) with your normal soap - do this prior to using CHG soap.  6.  Wash thoroughly, paying special attention to the area where your surgery will be performed.  7.  Thoroughly rinse your body with warm water from the neck down.  8.  DO NOT shower/wash with your normal soap after using and rinsing off the CHG Soap.  9.  Pat yourself dry with a clean towel.  10.  Wear clean pajamas.            11.  Place clean sheets on your bed the night of your first shower and do not sleep with pets.  Day of Surgery  Shower as above. Do not apply any lotions/deodorants the morning of surgery.   Please wear clean clothes to the hospital. Remember to brush your teeth with toothpaste.  Please read over the fact sheets that you were given.

## 2018-12-30 DIAGNOSIS — F0631 Mood disorder due to known physiological condition with depressive features: Secondary | ICD-10-CM | POA: Diagnosis not present
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
## 2018-12-30 NOTE — Progress Notes (Signed)
Anesthesia Chart Review: Follows yearly with Dr. Roxan Hockey for hx of ascending aortic aneurysm. Last seen 11/16/18. Per note, "Ascending aortic ectasia/aneurysm-aorta measures about 3.9 to 4.0 cm in distal ascending aorta.  Aortic root appears normal.  Needs continued annual follow-up. No abdominal aortic aneurysm by CT."  PFO s/p occluder device.  Echo normal 10/16.  Preop labs WNL  EKG in epic under date 09/28/18 is actually a scanned tracing from 2018. Pt will need repeat EKG on DOS.   TTE 12/18/14:  - Left ventricle: The cavity size was normal. Wall thickness was   increased in a pattern of mild LVH. Systolic function was normal.   The estimated ejection fraction was in the range of 60% to 65%.   Wall motion was normal; there were no regional wall motion   abnormalities. Doppler parameters are consistent with abnormal   left ventricular relaxation (grade 1 diastolic dysfunction). - Aortic valve: Mildly calcified annulus. Trileaflet. - Atrial septum: Normally functioning septal occluder device. No   leakage noted.   Wynonia Musty [facility] Short Stay Center/Anesthesiology Phone [phone] 12/30/2018 1:22 PM

## 2019-01-03 ENCOUNTER — Other Ambulatory Visit: Payer: Self-pay

## 2019-01-03 ENCOUNTER — Other Ambulatory Visit (HOSPITAL_COMMUNITY)
Admission: RE | Admit: 2019-01-03 | Discharge: 2019-01-03 | Disposition: A | Discharge status: Home / Self Care | Payer: Medicare Other | Source: Ambulatory Visit | Attending: Neurosurgery | Admitting: Neurosurgery

## 2019-01-03 DIAGNOSIS — Z01812 Encounter for preprocedural laboratory examination: Secondary | ICD-10-CM | POA: Insufficient documentation

## 2019-01-03 DIAGNOSIS — Z20828 Contact with and (suspected) exposure to other viral communicable diseases: Secondary | ICD-10-CM | POA: Diagnosis not present

## 2019-01-03 LAB — SARS CORONAVIRUS 2 (TAT 6-24 HRS): SARS Coronavirus 2: NEGATIVE

## 2019-01-04 ENCOUNTER — Encounter (HOSPITAL_COMMUNITY): Payer: Self-pay | Admitting: Certified Registered Nurse Anesthetist

## 2019-01-05 ENCOUNTER — Encounter (HOSPITAL_COMMUNITY): Payer: Self-pay | Admitting: *Deleted

## 2019-01-05 ENCOUNTER — Ambulatory Visit (HOSPITAL_COMMUNITY): Payer: Medicare Other | Admitting: Vascular Surgery

## 2019-01-05 ENCOUNTER — Other Ambulatory Visit: Payer: Self-pay

## 2019-01-05 ENCOUNTER — Ambulatory Visit (HOSPITAL_COMMUNITY): Payer: Medicare Other

## 2019-01-05 ENCOUNTER — Observation Stay (HOSPITAL_COMMUNITY)
Admission: RE | Admit: 2019-01-05 | Discharge: 2019-01-06 | Disposition: A | Discharge status: Home / Self Care | Payer: Medicare Other | Attending: Neurosurgery | Admitting: Neurosurgery

## 2019-01-05 ENCOUNTER — Ambulatory Visit (HOSPITAL_COMMUNITY): Payer: Medicare Other | Admitting: Certified Registered Nurse Anesthetist

## 2019-01-05 ENCOUNTER — Encounter (HOSPITAL_COMMUNITY)
Admission: RE | Disposition: A | Discharge status: Home / Self Care | Payer: Self-pay | Source: Home / Self Care | Attending: Neurosurgery

## 2019-01-05 DIAGNOSIS — M1990 Unspecified osteoarthritis, unspecified site: Secondary | ICD-10-CM | POA: Diagnosis not present

## 2019-01-05 DIAGNOSIS — M4802 Spinal stenosis, cervical region: Principal | ICD-10-CM | POA: Diagnosis present

## 2019-01-05 DIAGNOSIS — M5001 Cervical disc disorder with myelopathy,  high cervical region: Secondary | ICD-10-CM | POA: Insufficient documentation

## 2019-01-05 DIAGNOSIS — Z79899 Other long term (current) drug therapy: Secondary | ICD-10-CM | POA: Diagnosis not present

## 2019-01-05 DIAGNOSIS — I10 Essential (primary) hypertension: Secondary | ICD-10-CM | POA: Insufficient documentation

## 2019-01-05 DIAGNOSIS — M4722 Other spondylosis with radiculopathy, cervical region: Secondary | ICD-10-CM | POA: Insufficient documentation

## 2019-01-05 DIAGNOSIS — G5601 Carpal tunnel syndrome, right upper limb: Secondary | ICD-10-CM | POA: Diagnosis not present

## 2019-01-05 DIAGNOSIS — M47812 Spondylosis without myelopathy or radiculopathy, cervical region: Secondary | ICD-10-CM | POA: Diagnosis present

## 2019-01-05 DIAGNOSIS — Z7902 Long term (current) use of antithrombotics/antiplatelets: Secondary | ICD-10-CM | POA: Diagnosis not present

## 2019-01-05 DIAGNOSIS — F419 Anxiety disorder, unspecified: Secondary | ICD-10-CM | POA: Insufficient documentation

## 2019-01-05 DIAGNOSIS — Z8673 Personal history of transient ischemic attack (TIA), and cerebral infarction without residual deficits: Secondary | ICD-10-CM | POA: Insufficient documentation

## 2019-01-05 DIAGNOSIS — Z888 Allergy status to other drugs, medicaments and biological substances status: Secondary | ICD-10-CM | POA: Diagnosis not present

## 2019-01-05 DIAGNOSIS — J449 Chronic obstructive pulmonary disease, unspecified: Secondary | ICD-10-CM | POA: Insufficient documentation

## 2019-01-05 DIAGNOSIS — M4712 Other spondylosis with myelopathy, cervical region: Secondary | ICD-10-CM | POA: Diagnosis not present

## 2019-01-05 DIAGNOSIS — Z7982 Long term (current) use of aspirin: Secondary | ICD-10-CM | POA: Insufficient documentation

## 2019-01-05 DIAGNOSIS — Z87891 Personal history of nicotine dependence: Secondary | ICD-10-CM | POA: Insufficient documentation

## 2019-01-05 DIAGNOSIS — M5011 Cervical disc disorder with radiculopathy,  high cervical region: Secondary | ICD-10-CM | POA: Diagnosis not present

## 2019-01-05 DIAGNOSIS — F329 Major depressive disorder, single episode, unspecified: Secondary | ICD-10-CM | POA: Diagnosis not present

## 2019-01-05 DIAGNOSIS — G4730 Sleep apnea, unspecified: Secondary | ICD-10-CM | POA: Diagnosis not present

## 2019-01-05 DIAGNOSIS — M4322 Fusion of spine, cervical region: Secondary | ICD-10-CM | POA: Diagnosis not present

## 2019-01-05 DIAGNOSIS — I714 Abdominal aortic aneurysm, without rupture: Secondary | ICD-10-CM | POA: Diagnosis not present

## 2019-01-05 DIAGNOSIS — Z419 Encounter for procedure for purposes other than remedying health state, unspecified: Secondary | ICD-10-CM

## 2019-01-05 DIAGNOSIS — Z791 Long term (current) use of non-steroidal anti-inflammatories (NSAID): Secondary | ICD-10-CM | POA: Insufficient documentation

## 2019-01-05 HISTORY — PX: CARPAL TUNNEL RELEASE: SHX101

## 2019-01-05 HISTORY — PX: ANTERIOR CERVICAL DECOMP/DISCECTOMY FUSION: SHX1161

## 2019-01-05 SURGERY — ANTERIOR CERVICAL DECOMPRESSION/DISCECTOMY FUSION 1 LEVEL
Anesthesia: General | Site: Wrist | Laterality: Right

## 2019-01-05 MED ORDER — CALCIUM CARBONATE-VITAMIN D 500-200 MG-UNIT PO TABS
1.0000 | ORAL_TABLET | Freq: Every day | ORAL | Status: DC
  Administered 2019-01-06: 1 via ORAL
  Filled 2019-01-05: qty 1

## 2019-01-05 MED ORDER — TRAZODONE HCL 50 MG PO TABS
50.0000 mg | ORAL_TABLET | Freq: Every day | ORAL | Status: DC
  Administered 2019-01-05: 50 mg via ORAL
  Filled 2019-01-05: qty 1

## 2019-01-05 MED ORDER — THROMBIN 5000 UNITS EX SOLR
OROMUCOSAL | Status: DC | PRN
  Administered 2019-01-05: 5 mL

## 2019-01-05 MED ORDER — KETOROLAC TROMETHAMINE 30 MG/ML IJ SOLN
INTRAMUSCULAR | Status: AC
  Filled 2019-01-05: qty 1

## 2019-01-05 MED ORDER — METOPROLOL SUCCINATE ER 100 MG PO TB24: 200.0000 mg | ORAL_TABLET | Freq: Every day | ORAL | Status: DC

## 2019-01-05 MED ORDER — MEPERIDINE HCL 25 MG/ML IJ SOLN: 6.2500 mg | INTRAMUSCULAR | Status: DC | PRN

## 2019-01-05 MED ORDER — ACETAMINOPHEN 650 MG RE SUPP: 650.0000 mg | RECTAL | Status: DC | PRN

## 2019-01-05 MED ORDER — OXYCODONE HCL 5 MG PO TABS
ORAL_TABLET | ORAL | Status: AC
  Filled 2019-01-05: qty 1

## 2019-01-05 MED ORDER — PHENOL 1.4 % MT LIQD
1.0000 | OROMUCOSAL | Status: DC | PRN
  Filled 2019-01-05: qty 177

## 2019-01-05 MED ORDER — LIDOCAINE 2% (20 MG/ML) 5 ML SYRINGE
INTRAMUSCULAR | Status: DC | PRN
  Administered 2019-01-05: 60 mg via INTRAVENOUS

## 2019-01-05 MED ORDER — LAMOTRIGINE 100 MG PO TABS
200.0000 mg | ORAL_TABLET | Freq: Two times a day (BID) | ORAL | Status: DC
  Administered 2019-01-05 – 2019-01-06 (×2): 200 mg via ORAL
  Filled 2019-01-05 (×2): qty 2

## 2019-01-05 MED ORDER — SODIUM CHLORIDE 0.9 % IV SOLN
INTRAVENOUS | Status: DC | PRN
  Administered 2019-01-05: 09:00:00 500 mL

## 2019-01-05 MED ORDER — HYDROMORPHONE HCL 1 MG/ML IJ SOLN
0.2500 mg | INTRAMUSCULAR | Status: DC | PRN
  Administered 2019-01-05 (×2): 0.5 mg via INTRAVENOUS

## 2019-01-05 MED ORDER — BISACODYL 10 MG RE SUPP: 10.0000 mg | Freq: Every day | RECTAL | Status: DC | PRN

## 2019-01-05 MED ORDER — CEFAZOLIN SODIUM-DEXTROSE 2-4 GM/100ML-% IV SOLN
2.0000 g | Freq: Three times a day (TID) | INTRAVENOUS | Status: AC
  Administered 2019-01-05 (×2): 2 g via INTRAVENOUS
  Filled 2019-01-05 (×2): qty 100

## 2019-01-05 MED ORDER — BUPIVACAINE-EPINEPHRINE 0.5% -1:200000 IJ SOLN
INTRAMUSCULAR | Status: DC | PRN
  Administered 2019-01-05: 10 mL

## 2019-01-05 MED ORDER — VITAMIN B-1 100 MG PO TABS
100.0000 mg | ORAL_TABLET | Freq: Every day | ORAL | Status: DC
  Administered 2019-01-05 – 2019-01-06 (×2): 100 mg via ORAL
  Filled 2019-01-05 (×2): qty 1

## 2019-01-05 MED ORDER — THROMBIN 5000 UNITS EX SOLR
CUTANEOUS | Status: DC | PRN
  Administered 2019-01-05 (×2): 5000 [IU] via TOPICAL

## 2019-01-05 MED ORDER — ALUM & MAG HYDROXIDE-SIMETH 200-200-20 MG/5ML PO SUSP: 30.0000 mL | Freq: Four times a day (QID) | ORAL | Status: DC | PRN

## 2019-01-05 MED ORDER — BUPIVACAINE-EPINEPHRINE 0.5% -1:200000 IJ SOLN
INTRAMUSCULAR | Status: AC
  Filled 2019-01-05: qty 1

## 2019-01-05 MED ORDER — CYCLOBENZAPRINE HCL 10 MG PO TABS
10.0000 mg | ORAL_TABLET | Freq: Three times a day (TID) | ORAL | Status: DC | PRN
  Administered 2019-01-05: 10 mg via ORAL
  Filled 2019-01-05: qty 1

## 2019-01-05 MED ORDER — ROCURONIUM BROMIDE 100 MG/10ML IV SOLN
INTRAVENOUS | Status: DC | PRN
  Administered 2019-01-05: 20 mg via INTRAVENOUS
  Administered 2019-01-05: 30 mg via INTRAVENOUS
  Administered 2019-01-05: 50 mg via INTRAVENOUS
  Administered 2019-01-05: 20 mg via INTRAVENOUS
  Administered 2019-01-05: 30 mg via INTRAVENOUS
  Administered 2019-01-05 (×2): 20 mg via INTRAVENOUS

## 2019-01-05 MED ORDER — GLYCOPYRROLATE PF 0.2 MG/ML IJ SOSY
PREFILLED_SYRINGE | INTRAMUSCULAR | Status: DC | PRN
  Administered 2019-01-05: .1 mg via INTRAVENOUS

## 2019-01-05 MED ORDER — FENTANYL CITRATE (PF) 250 MCG/5ML IJ SOLN
INTRAMUSCULAR | Status: AC
  Filled 2019-01-05: qty 5

## 2019-01-05 MED ORDER — DEXAMETHASONE SODIUM PHOSPHATE 4 MG/ML IJ SOLN
4.0000 mg | Freq: Four times a day (QID) | INTRAMUSCULAR | Status: AC
  Administered 2019-01-05: 4 mg via INTRAVENOUS
  Filled 2019-01-05: qty 1

## 2019-01-05 MED ORDER — SUGAMMADEX SODIUM 200 MG/2ML IV SOLN
INTRAVENOUS | Status: DC | PRN
  Administered 2019-01-05: 250 mg via INTRAVENOUS

## 2019-01-05 MED ORDER — MORPHINE SULFATE (PF) 4 MG/ML IV SOLN: 4.0000 mg | INTRAVENOUS | Status: DC | PRN

## 2019-01-05 MED ORDER — PROPOFOL 10 MG/ML IV BOLUS
INTRAVENOUS | Status: DC | PRN
  Administered 2019-01-05: 150 mg via INTRAVENOUS
  Administered 2019-01-05: 20 mg via INTRAVENOUS

## 2019-01-05 MED ORDER — ROCURONIUM BROMIDE 10 MG/ML (PF) SYRINGE
PREFILLED_SYRINGE | INTRAVENOUS | Status: AC
  Filled 2019-01-05: qty 10

## 2019-01-05 MED ORDER — OXYCODONE HCL 5 MG/5ML PO SOLN: 5.0000 mg | Freq: Once | ORAL | Status: AC | PRN

## 2019-01-05 MED ORDER — LACTATED RINGERS IV SOLN
INTRAVENOUS | Status: DC | PRN
  Administered 2019-01-05 (×2): via INTRAVENOUS

## 2019-01-05 MED ORDER — DEXAMETHASONE SODIUM PHOSPHATE 10 MG/ML IJ SOLN
INTRAMUSCULAR | Status: DC | PRN
  Administered 2019-01-05: 10 mg via INTRAVENOUS

## 2019-01-05 MED ORDER — ACETAMINOPHEN 325 MG PO TABS: 650.0000 mg | ORAL_TABLET | Freq: Four times a day (QID) | ORAL | Status: DC | PRN

## 2019-01-05 MED ORDER — ACETAMINOPHEN 500 MG PO TABS
1000.0000 mg | ORAL_TABLET | Freq: Once | ORAL | Status: AC
  Administered 2019-01-05: 1000 mg via ORAL
  Filled 2019-01-05: qty 2

## 2019-01-05 MED ORDER — ACETAMINOPHEN 500 MG PO TABS
1000.0000 mg | ORAL_TABLET | Freq: Four times a day (QID) | ORAL | Status: AC
  Administered 2019-01-05 – 2019-01-06 (×4): 1000 mg via ORAL
  Filled 2019-01-05 (×4): qty 2

## 2019-01-05 MED ORDER — 0.9 % SODIUM CHLORIDE (POUR BTL) OPTIME
TOPICAL | Status: DC | PRN
  Administered 2019-01-05: 09:00:00 1000 mL

## 2019-01-05 MED ORDER — POVIDONE-IODINE 10 % EX OINT
TOPICAL_OINTMENT | CUTANEOUS | Status: AC
  Filled 2019-01-05: qty 28.35

## 2019-01-05 MED ORDER — PANTOPRAZOLE SODIUM 40 MG PO TBEC
40.0000 mg | DELAYED_RELEASE_TABLET | Freq: Every day | ORAL | Status: DC
  Administered 2019-01-05: 40 mg via ORAL
  Filled 2019-01-05: qty 1

## 2019-01-05 MED ORDER — THROMBIN 5000 UNITS EX SOLR
CUTANEOUS | Status: AC
  Filled 2019-01-05: qty 5000

## 2019-01-05 MED ORDER — OXYCODONE HCL 5 MG PO TABS
5.0000 mg | ORAL_TABLET | ORAL | Status: DC | PRN
  Administered 2019-01-06: 07:00:00 5 mg via ORAL
  Filled 2019-01-05: qty 1

## 2019-01-05 MED ORDER — FENTANYL CITRATE (PF) 100 MCG/2ML IJ SOLN
INTRAMUSCULAR | Status: DC | PRN
  Administered 2019-01-05: 100 ug via INTRAVENOUS
  Administered 2019-01-05 (×2): 50 ug via INTRAVENOUS

## 2019-01-05 MED ORDER — CHLORTHALIDONE 25 MG PO TABS
25.0000 mg | ORAL_TABLET | Freq: Every day | ORAL | Status: DC
  Administered 2019-01-06: 25 mg via ORAL
  Filled 2019-01-05: qty 1

## 2019-01-05 MED ORDER — CHLORHEXIDINE GLUCONATE CLOTH 2 % EX PADS: 6.0000 | MEDICATED_PAD | Freq: Once | CUTANEOUS | Status: DC

## 2019-01-05 MED ORDER — THROMBIN 5000 UNITS EX SOLR
CUTANEOUS | Status: AC
  Filled 2019-01-05: qty 10000

## 2019-01-05 MED ORDER — ONDANSETRON HCL 4 MG PO TABS: 4.0000 mg | ORAL_TABLET | Freq: Four times a day (QID) | ORAL | Status: DC | PRN

## 2019-01-05 MED ORDER — EPHEDRINE 5 MG/ML INJ
INTRAVENOUS | Status: AC
  Filled 2019-01-05: qty 10

## 2019-01-05 MED ORDER — EPHEDRINE SULFATE 50 MG/ML IJ SOLN
INTRAMUSCULAR | Status: DC | PRN
  Administered 2019-01-05: 5 mg via INTRAVENOUS

## 2019-01-05 MED ORDER — LIDOCAINE 2% (20 MG/ML) 5 ML SYRINGE
INTRAMUSCULAR | Status: AC
  Filled 2019-01-05: qty 5

## 2019-01-05 MED ORDER — DOCUSATE SODIUM 100 MG PO CAPS
100.0000 mg | ORAL_CAPSULE | Freq: Two times a day (BID) | ORAL | Status: DC
  Administered 2019-01-05 – 2019-01-06 (×2): 100 mg via ORAL
  Filled 2019-01-05 (×2): qty 1

## 2019-01-05 MED ORDER — HEMOSTATIC AGENTS (NO CHARGE) OPTIME
TOPICAL | Status: DC | PRN
  Administered 2019-01-05: 1

## 2019-01-05 MED ORDER — BACITRACIN ZINC 500 UNIT/GM EX OINT
TOPICAL_OINTMENT | CUTANEOUS | Status: AC
  Filled 2019-01-05: qty 28.35

## 2019-01-05 MED ORDER — KETOROLAC TROMETHAMINE 30 MG/ML IJ SOLN
30.0000 mg | Freq: Once | INTRAMUSCULAR | Status: AC | PRN
  Administered 2019-01-05: 30 mg via INTRAVENOUS

## 2019-01-05 MED ORDER — CEFAZOLIN SODIUM-DEXTROSE 2-4 GM/100ML-% IV SOLN
2.0000 g | INTRAVENOUS | Status: AC
  Administered 2019-01-05: 2 g via INTRAVENOUS
  Filled 2019-01-05: qty 100

## 2019-01-05 MED ORDER — ONDANSETRON HCL 4 MG/2ML IJ SOLN
INTRAMUSCULAR | Status: AC
  Filled 2019-01-05: qty 2

## 2019-01-05 MED ORDER — PANTOPRAZOLE SODIUM 40 MG IV SOLR: 40.0000 mg | Freq: Every day | INTRAVENOUS | Status: DC

## 2019-01-05 MED ORDER — MELATONIN 3 MG PO TABS
6.0000 mg | ORAL_TABLET | Freq: Every day | ORAL | Status: DC
  Administered 2019-01-05: 21:00:00 6 mg via ORAL
  Filled 2019-01-05: qty 2

## 2019-01-05 MED ORDER — HYDROMORPHONE HCL 1 MG/ML IJ SOLN
INTRAMUSCULAR | Status: AC
  Filled 2019-01-05: qty 1

## 2019-01-05 MED ORDER — MIRTAZAPINE 45 MG PO TABS
45.0000 mg | ORAL_TABLET | Freq: Every day | ORAL | Status: DC
  Filled 2019-01-05 (×3): qty 1

## 2019-01-05 MED ORDER — LACTATED RINGERS IV SOLN: INTRAVENOUS | Status: DC

## 2019-01-05 MED ORDER — ONDANSETRON HCL 4 MG/2ML IJ SOLN: 4.0000 mg | Freq: Four times a day (QID) | INTRAMUSCULAR | Status: DC | PRN

## 2019-01-05 MED ORDER — LOSARTAN POTASSIUM 50 MG PO TABS
100.0000 mg | ORAL_TABLET | Freq: Every day | ORAL | Status: DC
  Administered 2019-01-06: 100 mg via ORAL
  Filled 2019-01-05: qty 2

## 2019-01-05 MED ORDER — ONDANSETRON HCL 4 MG/2ML IJ SOLN
INTRAMUSCULAR | Status: DC | PRN
  Administered 2019-01-05: 4 mg via INTRAVENOUS

## 2019-01-05 MED ORDER — PROPOFOL 10 MG/ML IV BOLUS
INTRAVENOUS | Status: AC
  Filled 2019-01-05: qty 40

## 2019-01-05 MED ORDER — OXYCODONE HCL 5 MG PO TABS
10.0000 mg | ORAL_TABLET | ORAL | Status: DC | PRN
  Administered 2019-01-05 – 2019-01-06 (×5): 10 mg via ORAL
  Filled 2019-01-05 (×5): qty 2

## 2019-01-05 MED ORDER — DEXAMETHASONE SODIUM PHOSPHATE 10 MG/ML IJ SOLN
INTRAMUSCULAR | Status: AC
  Filled 2019-01-05: qty 1

## 2019-01-05 MED ORDER — BACITRACIN ZINC 500 UNIT/GM EX OINT
TOPICAL_OINTMENT | CUTANEOUS | Status: DC | PRN
  Administered 2019-01-05: 1 via TOPICAL

## 2019-01-05 MED ORDER — DEXAMETHASONE 4 MG PO TABS
4.0000 mg | ORAL_TABLET | Freq: Four times a day (QID) | ORAL | Status: AC
  Administered 2019-01-05: 17:00:00 4 mg via ORAL
  Filled 2019-01-05: qty 1

## 2019-01-05 MED ORDER — MENTHOL 3 MG MT LOZG: 1.0000 | LOZENGE | OROMUCOSAL | Status: DC | PRN

## 2019-01-05 MED ORDER — ACETAMINOPHEN 325 MG PO TABS: 650.0000 mg | ORAL_TABLET | ORAL | Status: DC | PRN

## 2019-01-05 MED ORDER — PHENYLEPHRINE HCL-NACL 10-0.9 MG/250ML-% IV SOLN
INTRAVENOUS | Status: DC | PRN
  Administered 2019-01-05: 25 ug/min via INTRAVENOUS

## 2019-01-05 MED ORDER — OXYCODONE HCL 5 MG PO TABS
5.0000 mg | ORAL_TABLET | Freq: Once | ORAL | Status: AC | PRN
  Administered 2019-01-05: 5 mg via ORAL

## 2019-01-05 MED ORDER — CALCIUM CARB-CHOLECALCIFEROL 600-200 MG-UNIT PO TABS: ORAL_TABLET | Freq: Every day | ORAL | Status: DC

## 2019-01-05 MED ORDER — PROMETHAZINE HCL 25 MG/ML IJ SOLN: 6.2500 mg | INTRAMUSCULAR | Status: DC | PRN

## 2019-01-05 SURGICAL SUPPLY — 80 items
ADH SKN CLS APL DERMABOND .7 (GAUZE/BANDAGES/DRESSINGS) ×2
APL SKNCLS STERI-STRIP NONHPOA (GAUZE/BANDAGES/DRESSINGS) ×2
BAG DECANTER FOR FLEXI CONT (MISCELLANEOUS) ×8 IMPLANT
BENZOIN TINCTURE PRP APPL 2/3 (GAUZE/BANDAGES/DRESSINGS) ×4 IMPLANT
BIT DRILL NEURO 2X3.1 SFT TUCH (MISCELLANEOUS) ×2 IMPLANT
BLADE SURG 15 STRL LF DISP TIS (BLADE) ×4 IMPLANT
BLADE SURG 15 STRL SS (BLADE) ×8
BLADE ULTRA TIP 2M (BLADE) ×4 IMPLANT
BNDG CMPR 75X41 PLY HI ABS (GAUZE/BANDAGES/DRESSINGS) ×2
BNDG ELASTIC 3X5.8 VLCR STR LF (GAUZE/BANDAGES/DRESSINGS) ×2 IMPLANT
BNDG ELASTIC 4X5.8 VLCR STR LF (GAUZE/BANDAGES/DRESSINGS) ×4 IMPLANT
BNDG STRETCH 4X75 STRL LF (GAUZE/BANDAGES/DRESSINGS) ×4 IMPLANT
BUR BARREL STRAIGHT FLUTE 4.0 (BURR) ×4 IMPLANT
BUR MATCHSTICK NEURO 3.0 LAGG (BURR) ×4 IMPLANT
CABLE BIPOLOR RESECTION CORD (MISCELLANEOUS) ×4 IMPLANT
CANISTER SUCT 3000ML PPV (MISCELLANEOUS) ×4 IMPLANT
CARTRIDGE OIL MAESTRO DRILL (MISCELLANEOUS) ×4 IMPLANT
CLOSURE WOUND 1/2 X4 (GAUZE/BANDAGES/DRESSINGS) ×1
COVER MAYO STAND STRL (DRAPES) ×4 IMPLANT
COVER WAND RF STERILE (DRAPES) ×8 IMPLANT
DECANTER SPIKE VIAL GLASS SM (MISCELLANEOUS) ×4 IMPLANT
DERMABOND ADVANCED (GAUZE/BANDAGES/DRESSINGS) ×2
DERMABOND ADVANCED .7 DNX12 (GAUZE/BANDAGES/DRESSINGS) IMPLANT
DIFFUSER DRILL AIR PNEUMATIC (MISCELLANEOUS) ×8 IMPLANT
DRAPE EXTREMITY T 121X128X90 (DISPOSABLE) ×4 IMPLANT
DRAPE HALF SHEET 40X57 (DRAPES) ×12 IMPLANT
DRAPE LAPAROTOMY 100X72 PEDS (DRAPES) ×4 IMPLANT
DRAPE MICROSCOPE LEICA (MISCELLANEOUS) IMPLANT
DRAPE POUCH INSTRU U-SHP 10X18 (DRAPES) ×4 IMPLANT
DRAPE SURG 17X23 STRL (DRAPES) ×8 IMPLANT
DRILL NEURO 2X3.1 SOFT TOUCH (MISCELLANEOUS) ×4
DRSG OPSITE POSTOP 3X4 (GAUZE/BANDAGES/DRESSINGS) ×2 IMPLANT
ELECT REM PT RETURN 9FT ADLT (ELECTROSURGICAL) ×4
ELECTRODE REM PT RTRN 9FT ADLT (ELECTROSURGICAL) ×2 IMPLANT
GAUZE 4X4 16PLY RFD (DISPOSABLE) ×6 IMPLANT
GAUZE SPONGE 4X4 12PLY STRL (GAUZE/BANDAGES/DRESSINGS) ×6 IMPLANT
GLOVE BIO SURGEON STRL SZ8 (GLOVE) ×8 IMPLANT
GLOVE BIO SURGEON STRL SZ8.5 (GLOVE) ×8 IMPLANT
GLOVE EXAM NITRILE XL STR (GLOVE) IMPLANT
GOWN STRL REUS W/ TWL LRG LVL3 (GOWN DISPOSABLE) ×2 IMPLANT
GOWN STRL REUS W/ TWL XL LVL3 (GOWN DISPOSABLE) ×4 IMPLANT
GOWN STRL REUS W/TWL LRG LVL3 (GOWN DISPOSABLE) ×4
GOWN STRL REUS W/TWL XL LVL3 (GOWN DISPOSABLE) ×8
HEMOSTAT POWDER KIT SURGIFOAM (HEMOSTASIS) ×4 IMPLANT
KIT BASIN OR (CUSTOM PROCEDURE TRAY) ×8 IMPLANT
KIT TURNOVER KIT B (KITS) ×8 IMPLANT
MARKER SKIN DUAL TIP RULER LAB (MISCELLANEOUS) ×8 IMPLANT
NDL HYPO 25X1 1.5 SAFETY (NEEDLE) ×2 IMPLANT
NDL SPNL 18GX3.5 QUINCKE PK (NEEDLE) ×2 IMPLANT
NEEDLE HYPO 22GX1.5 SAFETY (NEEDLE) ×4 IMPLANT
NEEDLE HYPO 25X1 1.5 SAFETY (NEEDLE) ×4 IMPLANT
NEEDLE SPNL 18GX3.5 QUINCKE PK (NEEDLE) ×8 IMPLANT
NS IRRIG 1000ML POUR BTL (IV SOLUTION) ×8 IMPLANT
OIL CARTRIDGE MAESTRO DRILL (MISCELLANEOUS) ×8
PACK LAMINECTOMY NEURO (CUSTOM PROCEDURE TRAY) ×4 IMPLANT
PACK SURGICAL SETUP 50X90 (CUSTOM PROCEDURE TRAY) ×4 IMPLANT
PAD ARMBOARD 7.5X6 YLW CONV (MISCELLANEOUS) ×4 IMPLANT
PIN DISTRACTION 14MM (PIN) ×8 IMPLANT
PLATE ANT CERV XTEND 1 LV 14 (Plate) ×2 IMPLANT
PUTTY DBM 2CC CALC GRAN (Putty) ×2 IMPLANT
RUBBERBAND STERILE (MISCELLANEOUS) IMPLANT
SCREW XTD VAR 4.2 SELF TAP (Screw) ×8 IMPLANT
SPONGE INTESTINAL PEANUT (DISPOSABLE) ×10 IMPLANT
SPONGE SURGIFOAM ABS GEL SZ50 (HEMOSTASIS) ×2 IMPLANT
STOCKINETTE 4X48 STRL (DRAPES) ×4 IMPLANT
STRIP CLOSURE SKIN 1/2X4 (GAUZE/BANDAGES/DRESSINGS) ×3 IMPLANT
SUT ETHILON 2 0 FS 18 (SUTURE) ×2 IMPLANT
SUT ETHILON 3 0 PS 1 (SUTURE) ×4 IMPLANT
SUT VIC AB 0 CT1 27 (SUTURE) ×4
SUT VIC AB 0 CT1 27XBRD ANTBC (SUTURE) ×2 IMPLANT
SUT VIC AB 2-0 CP2 18 (SUTURE) ×2 IMPLANT
SUT VIC AB 3-0 SH 8-18 (SUTURE) ×8 IMPLANT
SYR BULB 3OZ (MISCELLANEOUS) ×4 IMPLANT
SYR CONTROL 10ML LL (SYRINGE) ×4 IMPLANT
TOWEL GREEN STERILE (TOWEL DISPOSABLE) ×4 IMPLANT
TOWEL GREEN STERILE FF (TOWEL DISPOSABLE) ×16 IMPLANT
TUBE CONNECTING 12'X1/4 (SUCTIONS) ×1
TUBE CONNECTING 12X1/4 (SUCTIONS) ×3 IMPLANT
UNDERPAD 30X30 (UNDERPADS AND DIAPERS) ×4 IMPLANT
WATER STERILE IRR 1000ML POUR (IV SOLUTION) ×8 IMPLANT

## 2019-01-05 NOTE — Anesthesia Procedure Notes (Signed)
Procedure Name: Intubation Date/Time: 01/05/2019 8:31 AM Performed by: Candis Shine, CRNA Pre-anesthesia Checklist: Patient identified, Emergency Drugs available, Suction available and Patient being monitored Patient Re-evaluated:Patient Re-evaluated prior to induction Oxygen Delivery Method: Circle System Utilized Preoxygenation: Pre-oxygenation with 100% oxygen Induction Type: IV induction Ventilation: Mask ventilation without difficulty and Oral airway inserted - appropriate to patient size Laryngoscope Size: Glidescope and 4 Grade View: Grade I Tube type: Oral Tube size: 7.5 mm Number of attempts: 1 Airway Equipment and Method: Oral airway,  Video-laryngoscopy and Rigid stylet Placement Confirmation: ETT inserted through vocal cords under direct vision,  positive ETCO2 and breath sounds checked- equal and bilateral Secured at: 23 cm Tube secured with: Tape Dental Injury: Teeth and Oropharynx as per pre-operative assessment  Comments: Elective glidescope intubation.

## 2019-01-05 NOTE — Anesthesia Postprocedure Evaluation (Signed)
Anesthesia Post Note  Patient: [name]  Procedure(s) Performed: ANTERIOR CERVICAL DECOMPRESSION/DISCECTOMY FUSION, INTERBODY PROSTHESIS, PLATE/SCREWS CERVICAL THREE- CERVICAL FOUR (N/A Spine Cervical) CARPAL TUNNEL RELEASE (Right Wrist)     Patient location during evaluation: PACU Anesthesia Type: General Level of consciousness: awake and alert, oriented and patient cooperative Pain management: pain level controlled Vital Signs Assessment: post-procedure vital signs reviewed and stable Respiratory status: spontaneous breathing, nonlabored ventilation and respiratory function stable Cardiovascular status: blood pressure returned to baseline and stable Postop Assessment: no apparent nausea or vomiting Anesthetic complications: no    Last Vitals:  Vitals:   01/05/19 1145 01/05/19 1200  BP: 131/80 129/78  Pulse: (!) 55 (!) 49  Resp: 17 14  Temp:    SpO2: 92% 94%    Last Pain:  Vitals:   01/05/19 1154  TempSrc:   PainSc: [place]

## 2019-01-05 NOTE — Progress Notes (Signed)
Orthopedic Tech Progress Note Patient Details:  [name] [birth year] [id number] RN said patient has collar Patient ID: [name], male   DOB: 1951/01/16, 68 y.o.   MRN: [id number]   [name] 01/05/2019, 12:50 PM

## 2019-01-05 NOTE — Op Note (Signed)
Brief history: The patient is a 68 year old white male who has complained of neck pain with arm and hand numbness and tingling.  He was worked up with NCV EMGs which demonstrated carpal tunnel syndrome.  I discussed the various treatment options with the patient.  He has decided to proceed with a right carpal tunnel release.  Preoperative diagnosis: Right carpal tunnel syndrome  Postoperative diagnosis: The same  Procedure: Open right carpal tunnel release  Surgeon: Dr. Earle Gell  Asst.: None  Anesthesia: MAC  Estimated blood loss: Minimal  Drains: None  Complications: None  Description of procedure: The patient was brought to the operating room by the anesthesia team. MAC anesthesia was induced. The patient remained in the supine position. The patient's right upper extremity was prepared with Betadine scrub and Betadine solution. Sterile drapes were applied.  I then injected the area to be incised with Marcaine solution. I then used a scalpel to make an incision in the palmar crease. I then dissected with the scalpel deeper and incised the superficial fascia. We inserted the Weitlanter retractor for exposure. We then identified the transverse carpal ligament. I carefully incised with a 15 blade scalpel. We then identified the median nerve. I used the Metzenbaum scissors to divide the  transverse carpal ligament both proximally and distally staying along the ulnar aspect of the median nerve. We got a good decompression of the nerve.  I then obtained hemostasis using bipolar electrocautery. We then irrigated the wound out with bacitracin solution. I then removed the retractor. We then reapproximated patient's subcutaneous tissue with interrupted 3-0 Vicryl suture. The skin was reapproximated with a running 3-0 nylon suture. The wound was then covered with bacitracin ointment. A sterile dressing was applied. The drapes were removed. Patient was subsequently transported to the post anesthesia  care unit in stable condition. All sponge instrument and needle counts were reportedly correct at the end of this case.
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
## 2019-01-05 NOTE — Transfer of Care (Signed)
Immediate Anesthesia Transfer of Care Note  Patient: [name]  Procedure(s) Performed: ANTERIOR CERVICAL DECOMPRESSION/DISCECTOMY FUSION, INTERBODY PROSTHESIS, PLATE/SCREWS CERVICAL THREE- CERVICAL FOUR (N/A Spine Cervical) CARPAL TUNNEL RELEASE (Right Wrist)  Patient Location: PACU  Anesthesia Type:General  Level of Consciousness: awake, alert  and oriented  Airway & Oxygen Therapy: Patient Spontanous Breathing  Post-op Assessment: Report given to RN and Post -op Vital signs reviewed and stable  Post vital signs: Reviewed and stable  Last Vitals:  Vitals Value Taken Time  BP 128/76 01/05/19 1116  Temp    Pulse 55 01/05/19 1116  Resp 13 01/05/19 1116  SpO2 93 % 01/05/19 1116  Vitals shown include unvalidated device data.  Last Pain:  Vitals:   01/05/19 0716  TempSrc:   PainSc: 5       Patients Stated Pain Goal: 5 (0000000 [id number])  Complications: No apparent anesthesia complications

## 2019-01-05 NOTE — Op Note (Signed)
Brief history: The patient is a 68 year old white male who has complained of neck and arm pain.  He has failed medical management.  He was worked up with a cervical MRI which demonstrated spondylosis and stenosis at C3-4.  I discussed the various treatment options.  He has weighed the risks, benefits and alternatives and decided proceed with a C3-4 anterior cervical discectomy, fusion and plating.  Preoperative diagnosis: C3-4 disc degeneration, spondylosis, stenosis, cervical radiculopathy, cervical myelopathy  Postoperative diagnosis: The same  Procedure: C3-4 anterior cervical discectomy/decompression; C3-4 interbody arthrodesis with local morcellized autograft bone and Zimmer DBM; insertion of interbody prosthesis at C3-4 (Zimmer peek interbody prosthesis); anterior cervical plating from C3-4 with globus titanium plate  Surgeon: Dr. Earle Gell  Asst.: Jethro Bolus nurse practitioner  Anesthesia: Gen. endotracheal  Estimated blood loss: 100 cc  Drains: None  Complications: None  Description of procedure: The patient was brought to the operating room by the anesthesia team. General endotracheal anesthesia was induced. A roll was placed under the patient's shoulders to keep the neck in the neutral position. The patient's anterior cervical region was then prepared with Betadine scrub and Betadine solution. Sterile drapes were applied.  The area to be incised was then injected with Marcaine with epinephrine solution. I then used a scalpel to make a transverse incision in the patient's left anterior neck. I used the Metzenbaum scissors to divide the platysmal muscle and then to dissect medial to the sternocleidomastoid muscle, jugular vein, and carotid artery. I carefully dissected down towards the anterior cervical spine identifying the esophagus and retracting it medially. Then using Kitner swabs to clear soft tissue from the anterior cervical spine. We then inserted a bent spinal needle  into the upper exposed intervertebral disc space. We then obtained intraoperative radiographs confirm our location.  I then used electrocautery to detach the medial border of the longus colli muscle bilaterally from the C3-4 intervertebral disc spaces. I then inserted the Caspar self-retaining retractor underneath the longus colli muscle bilaterally to provide exposure.  We then incised the intervertebral disc at C3-4. We then performed a partial intervertebral discectomy with a pituitary forceps and the Karlin curettes. I then inserted distraction screws into the vertebral bodies at C3 and C4. We then distracted the interspace. We then used the high-speed drill to decorticate the vertebral endplates at [id number]-GRM, to drill away the remainder of the intervertebral disc, to drill away some posterior spondylosis, and to thin out the posterior longitudinal ligament. I then incised ligament with the arachnoid knife. We then removed the ligament with a Kerrison punches undercutting the vertebral endplates and decompressing the thecal sac. We then performed foraminotomies about the bilateral C4 nerve roots. This completed the decompression at this level.  We now turned our to attention to the interbody fusion. We used the trial spacers to determine the appropriate size for the interbody prosthesis. We then pre-filled prosthesis with a combination of local morcellized autograft bone that we obtained during decompression as well as Zimmer DBM. We then inserted the prosthesis into the distracted interspace at C3-4. We then removed the distraction screws. There was a good snug fit of the prosthesis in the interspace.  Having completed the fusion we now turned attention to the anterior spinal instrumentation. We used the high-speed drill to drill away some anterior spondylosis at the disc spaces so that the plate lay down flat. We selected the appropriate length titanium anterior cervical plate. We laid it along the anterior  aspect of the vertebral bodies  from C3 and C4. We then drilled 14 mm holes at C3 and C4. We then secured the plate to the vertebral bodies by placing two 14 mm self-tapping screws at C3 and C4. We then obtained intraoperative radiograph. The demonstrating good position of the instrumentation. We therefore secured the screws the plate the locking each cam. This completed the instrumentation.  We then obtained hemostasis using bipolar electrocautery. We irrigated the wound out with bacitracin solution. We then removed the retractor. We inspected the esophagus for any damage. There was none apparent. We then reapproximated patient's platysmal muscle with interrupted 3-0 Vicryl suture. We then reapproximated the subcutaneous tissue with interrupted 3-0 Vicryl suture. The skin was reapproximated with Steri-Strips and benzoin. The wound was then covered with bacitracin ointment. A sterile dressing was applied. The drapes were removed. Patient was subsequently extubated by the anesthesia team and transported to the post anesthesia care unit in stable condition. All sponge instrument and needle counts were reportedly correct at the end of this case.

## 2019-01-05 NOTE — Progress Notes (Signed)
Subjective: The patient is alert and pleasant.  He looks well.  His daughter is at the bedside.  Objective: Vital signs in last 24 hours: Temp:  [97.6 F (36.4 C)-98.8 F (37.1 C)] 97.6 F (36.4 C) (11/18 1242) Pulse Rate:  [45-90] 58 (11/18 1242) Resp:  [12-19] 18 (11/18 1242) BP: (126-170)/(76-88) 170/88 (11/18 1242) SpO2:  [92 %-100 %] 100 % (11/18 1242) Weight:  [110.9 kg] 110.9 kg (11/18 1245) Estimated body mass index is 32.26 kg/m as calculated from the following:   Height as of this encounter: 6\' 1"  (1.854 m).   Weight as of this encounter: 110.9 kg.   Intake/Output from previous day: No intake/output data recorded. Intake/Output this shift: Total I/O In: 1200 [P.O.:200; I.V.:1000] Out: 25 [Blood:25]  Physical exam the patient is alert and oriented.  His dressing is clean and dry.  There is no hematoma or shift.  He is moving all 4 extremities well.  Lab Results: No results for input(s): WBC, HGB, HCT, PLT in the last 72 hours. BMET No results for input(s): NA, K, CL, CO2, GLUCOSE, BUN, CREATININE, CALCIUM in the last 72 hours.  Studies/Results: Dg Cervical Spine 2-3 Views  Result Date: 01/05/2019 CLINICAL DATA:  C3-4 ACDF EXAM: CERVICAL SPINE - 2-3 VIEW COMPARISON:  MRI cervical spine 07/19/2018 FINDINGS: Cross-table lateral portable film obtained at 0954 hours shows soft tissue retractors overlying the anterior neck. Radiopaque localization needle is position with the tip overlying the C3-4 interspace. Subsequent film labeled "#2" Obtained at 1049 hours shows anterior cervical plate with screws at C3 and C4. Interbody graft material visible in the C3-4 interspace. No evidence for immediate hardware complication. Surgical sponge noted in the operative bed. IMPRESSION: Intraoperative assessment during C3-4 ACDF with plating. Electronically Signed   By: Misty Stanley M.D.   On: 01/05/2019 12:01    Assessment/Plan: The patient is doing well.  He will likely go home  tomorrow.  LOS: 0 days     Ophelia Charter 01/05/2019, 2:30 PM

## 2019-01-06 DIAGNOSIS — M4802 Spinal stenosis, cervical region: Secondary | ICD-10-CM | POA: Diagnosis not present

## 2019-01-06 DIAGNOSIS — M5011 Cervical disc disorder with radiculopathy,  high cervical region: Secondary | ICD-10-CM | POA: Diagnosis not present

## 2019-01-06 DIAGNOSIS — M5001 Cervical disc disorder with myelopathy,  high cervical region: Secondary | ICD-10-CM | POA: Diagnosis not present

## 2019-01-06 DIAGNOSIS — M4712 Other spondylosis with myelopathy, cervical region: Secondary | ICD-10-CM | POA: Diagnosis not present

## 2019-01-06 DIAGNOSIS — M4722 Other spondylosis with radiculopathy, cervical region: Secondary | ICD-10-CM | POA: Diagnosis not present

## 2019-01-06 DIAGNOSIS — G5601 Carpal tunnel syndrome, right upper limb: Secondary | ICD-10-CM | POA: Diagnosis not present

## 2019-01-06 NOTE — Discharge Instructions (Addendum)
Wound Care Keep incision area dry.  You may remove outer bandage after 2 days and shower.   If you shower prior cover incision with plastic wrap.  Do not put any creams, lotions, or ointments on incision. Leave steri-strips on neck.  They will fall off by themselves. Activity Walk each and every day, increasing distance each day. No lifting greater than 5 lbs.  Avoid excessive neck motion. No driving for 2 weeks; may ride as a passenger locally. Wear neck brace at all times except when showering. Diet Resume your normal diet.  Return to Work Will be discussed at you follow up appointment. Call Your Doctor If Any of These Occur Redness, drainage, or swelling at the wound.  Temperature greater than 101 degrees. Severe pain not relieved by pain medication. Increased difficulty swallowing.  Incision starts to come apart. Follow Up Appt Call today and ask for Wells Guiles for appointment in 3  weeks ([phone]) or for problems.  If you have any hardware placed in your spine, you will need an x-ray before your appointment.

## 2019-01-06 NOTE — Progress Notes (Signed)
PT Cancellation Note  Patient Details Name: [name] MRN: [id number] DOB: [birth year]   Cancelled Treatment:    Reason Eval/Treat Not Completed: PT screened, no needs identified, will sign off. Discussed pt case with OT who reports pt is independent with mobility and observed pt independently ambulating in hallway without AD. Will defer formal PT evaluation and sign off however if needs change, please reconsult.   Thelma Comp 01/06/2019, 10:37 AM   Rolinda Roan, PT, DPT Acute Rehabilitation Services Pager: [phone] Office: [phone]

## 2019-01-06 NOTE — Plan of Care (Signed)
Patient alert and oriented, mae's well, voiding adequate amount of urine, swallowing without difficulty, no c/o pain at time of discharge. Patient discharged home with family. Script and discharged instructions given to patient. Patient and family stated understanding of instructions given. Patient has an appointment with Dr. Jenkins   

## 2019-01-06 NOTE — Evaluation (Signed)
Occupational Therapy Evaluation Patient Details Name: [name] MRN: [id number] DOB: [birth year] Today's Date: 01/06/2019    History of Present Illness Pt is a 68 yo male s/p ACDF C3-4 and carpal tunnel release. PMHx:  carpal tunnel syndrome, anxiety, depression, HTN, stroke, memory loss.    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 Pt currently with no focal deficits in R hand or in neck, other than minimal pain in neck. Pt performing ADL and mobility with no deficits. Pt able to don/doff brace, Education provided for washing instructions; education for LB dressing with figure 4 technique. Minimal cues required for abiding by precautions. Neck handout provided and reviewed ADL in detail. Pt educated on: clothing between brace, never sleep in brace, set an alarm at night for medication, avoid sitting for long periods of time, correct bed positioning for sleeping, correct sequence for bed mobility, avoiding lifting more than 5 pounds and never wash directly over incision. All education is complete and patient indicates understanding. Pt does not require continued OT skilled services. OT signing off.    Follow Up Recommendations  No OT follow up    Equipment Recommendations  None recommended by OT    Recommendations for Other Services       Precautions / Restrictions Precautions Precautions: Cervical Precaution Booklet Issued: Yes (comment) Precaution Comments: cervical precautions and handout Required Braces or Orthoses: Cervical Brace Restrictions Weight Bearing Restrictions: No      Mobility Bed Mobility Overal bed mobility: Modified Independent             General bed mobility comments: Education completed  Transfers Overall transfer level: Modified independent                    Balance Overall balance assessment: Modified Independent                                         ADL either  performed or assessed with clinical judgement   ADL Overall ADL's : Modified independent                                       General ADL Comments: Pt able to don/doff brace, Education provided for washing instructions; education for LB dressing with figure 4 technique.     Vision Baseline Vision/History: No visual deficits Vision Assessment?: No apparent visual deficits     Perception     Praxis      Pertinent Vitals/Pain Pain Assessment: 0-10 Pain Score: 2  Pain Location: neck Pain Descriptors / Indicators: Discomfort Pain Intervention(s): Limited activity within patient's tolerance     Hand Dominance Right   Extremity/Trunk Assessment Upper Extremity Assessment Upper Extremity Assessment: Overall WFL for tasks assessed   Lower Extremity Assessment Lower Extremity Assessment: Overall WFL for tasks assessed   Cervical / Trunk Assessment Cervical / Trunk Assessment: Other exceptions Cervical / Trunk Exceptions: s/p cervical sx   Communication Communication Communication: No difficulties   Cognition Arousal/Alertness: Awake/alert Behavior During Therapy: WFL for tasks assessed/performed Overall Cognitive Status: Within Functional Limits for tasks assessed  General Comments  Cervical brace required fixing and pt able to don/doff.    Exercises     Shoulder Instructions      Home Living Family/patient expects to be discharged to:: Private residence Living Arrangements: Spouse/significant other Available Help at Discharge: Family;Available 24 hours/day Type of Home: House Home Access: Stairs to enter [employer] of Steps: 2 Entrance Stairs-Rails: Right Home Layout: Two level Alternate Level Stairs-Number of Steps: chair lift   Bathroom Shower/Tub: Tub/shower unit;Walk-in shower   Bathroom Toilet: Handicapped height     Home Equipment: Cane - single point;Hand held shower  head          Prior Functioning/Environment Level of Independence: Independent                 OT Problem List: Pain      OT Treatment/Interventions:      OT Goals(Current goals can be found in the care plan section) Acute Rehab OT Goals OT Goal Formulation: With patient  OT Frequency:     Barriers to D/C:            Co-evaluation              AM-PAC OT "6 Clicks" Daily Activity     Outcome Measure Help from another person eating meals?: None Help from another person taking care of personal grooming?: None Help from another person toileting, which includes using toliet, bedpan, or urinal?: None Help from another person bathing (including washing, rinsing, drying)?: None Help from another person to put on and taking off regular upper body clothing?: None Help from another person to put on and taking off regular lower body clothing?: None 6 Click Score: 24   End of Session Equipment Utilized During Treatment: Cervical collar Nurse Communication: Mobility status  Activity Tolerance: Patient tolerated treatment well Patient left: in chair;with call bell/phone within reach  OT Visit Diagnosis: Unsteadiness on feet (R26.81);Pain Pain - part of body: (neck)                Time: [id number] OT Time Calculation (min): 21 min Charges:  OT General Charges $OT Visit: 1 Visit OT Evaluation $OT Eval Moderate Complexity: 1 Mod  Darryl Nestle) Marsa Aris OTR/L Acute Rehabilitation Services Pager: [phone] Office: [place] 01/06/2019, 9:33 AM

## 2019-01-06 NOTE — Discharge Summary (Signed)
Physician Discharge Summary  Patient ID: [name] MRN: [id number] DOB/AGE: [birth year] 68 y.o.  Admit date: 01/05/2019 Discharge date: 01/06/2019  Admission Diagnoses: Cervical spondylosis, Spinal stenosis of cervical region  Discharge Diagnoses:  Active Problems:   Cervical spondylosis   Spinal stenosis of cervical region   Discharged Condition: good  Hospital Course: Patient was admitted following a right-sided carpal tunnel release and C3-4 anterior cervical decompression and fusion. He has done well post operatively. He has ambulated and his pain is well-controlled. He is ready for discharge home.  Consults: rehabilitation medicine  Significant Diagnostic Studies: radiology: Dg Cervical Spine 2-3 Views  Result Date: 01/05/2019 CLINICAL DATA:  C3-4 ACDF EXAM: CERVICAL SPINE - 2-3 VIEW COMPARISON:  MRI cervical spine 07/19/2018 FINDINGS: Cross-table lateral portable film obtained at 0954 hours shows soft tissue retractors overlying the anterior neck. Radiopaque localization needle is position with the tip overlying the C3-4 interspace. Subsequent film labeled "#2" Obtained at 1049 hours shows anterior cervical plate with screws at C3 and C4. Interbody graft material visible in the C3-4 interspace. No evidence for immediate hardware complication. Surgical sponge noted in the operative bed. IMPRESSION: Intraoperative assessment during C3-4 ACDF with plating. Electronically Signed   By: Misty Stanley M.D.   On: 01/05/2019 12:01     Treatments: surgery: Open right carpal tunnel release   C3-4 anterior cervical discectomy/decompression; C3-4 interbody arthrodesis with local morcellized autograft bone and Zimmer DBM; insertion of interbody prosthesis at C3-4 (Zimmer peek interbody prosthesis); anterior cervical plating from C3-4 with globus titanium plate  Discharge Exam: Blood pressure 133/65, pulse (!) 59, temperature 98.2 F (36.8 C), temperature source Oral, resp. rate 16,  height 6\' 1"  (1.854 m), weight 110.9 kg, SpO2 95 %.   Alert and oriented x 4 PERRLA CN II-XII grossly intact MAE, Strength and sensation intact Cervical incision is covered with Honeycomb dressing and Steri Strips; Dressing is clean, dry, and intact Right carpal tunnel release incision is wrapped with an ACE bandage. Dressing is clean, dry, and intact   Disposition: Discharge disposition: 01-Home or Self Care        Allergies as of 01/06/2019      Reactions   Aricept [donepezil Hcl] Other (See Comments)   PASSED OUT/HYPOTENSION SYNCOPE   Bee Venom Anaphylaxis, Hives   Namenda [memantine Hcl] Other (See Comments)   HYPOTENSION-PASSED OUT SYNCOPE   Quinine Derivatives Other (See Comments)   PASSED OUT   Statins Other (See Comments)   TGA/MIGRAINES   Divalproex Sodium Other (See Comments)   Patient fainted but was taking this medication with another different drug. QUESTIONABLE IF THE CAUSE OF PASSING OUT   Oxcarbazepine Other (See Comments)   UNSPECIFIED REACTION       Medication List    STOP taking these medications   aspirin EC 325 MG tablet   clopidogrel 75 MG tablet Commonly known as: PLAVIX   docusate sodium 100 MG capsule Commonly known as: Colace   naproxen sodium 220 MG tablet Commonly known as: ALEVE   omega-3 fish oil 1000 MG Caps capsule Commonly known as: MAXEPA   OMEGA-3 FISH OIL PO     TAKE these medications   acetaminophen 325 MG tablet Commonly known as: TYLENOL Take 650 mg by mouth every 6 (six) hours as needed for mild pain.   CALCIUM 600+D3 PO Take 1 tablet by mouth daily.   chlorthalidone 25 MG tablet Commonly known as: HYGROTON Take 25 mg by mouth daily.   lamoTRIgine 200 MG tablet Commonly  known as: LAMICTAL Take 200 mg by mouth 2 (two) times daily.   losartan 50 MG tablet Commonly known as: COZAAR Take 100 mg by mouth daily.   Melatonin 5 MG Tabs Take 5 mg by mouth at bedtime.   metoprolol 200 MG 24 hr  tablet Commonly known as: TOPROL-XL Take 200 mg by mouth daily.   mirtazapine 45 MG tablet Commonly known as: REMERON Take 45 mg by mouth at bedtime.   thiamine 100 MG tablet Take 100 mg by mouth daily.   traZODone 50 MG tablet Commonly known as: DESYREL Take 50 mg by mouth at bedtime.      Follow-up Information    Newman Pies, MD. Schedule an appointment as soon as possible for a visit in 2 week(s).   Specialty: Neurosurgery Contact information: [address] [place] [address] [phone]           Signed: Patricia Nettle 01/06/2019, 10:16 AM

## 2019-01-06 NOTE — Care Management CC44 (Signed)
Condition Code 44 Documentation Completed  Patient Details  Name: [name] MRN: [id number] Date of Birth: 24-Aug-1950   Condition Code 44 given:  Yes Patient signature on Condition Code 44 notice:  Yes Documentation of 2 MD's agreement:  Yes Code 44 added to claim:  Yes    Sharin Mons, RN 01/06/2019, 10:31 AM

## 2019-01-06 NOTE — Care Management Obs Status (Signed)
[facility] NOTIFICATION   Patient Details  Name: [name] MRN: [id number] Date of Birth: [birth year]   Medicare Observation Status Notification Given:  Yes    Sharin Mons, RN 01/06/2019, 10:31 AM

## 2019-01-07 ENCOUNTER — Encounter (HOSPITAL_COMMUNITY): Payer: Self-pay | Admitting: Neurosurgery

## 2019-01-20 DIAGNOSIS — F0631 Mood disorder due to known physiological condition with depressive features: Secondary | ICD-10-CM | POA: Diagnosis not present

## 2019-02-24 DIAGNOSIS — F0631 Mood disorder due to known physiological condition with depressive features: Secondary | ICD-10-CM | POA: Diagnosis not present

## 2019-03-08 DIAGNOSIS — I10 Essential (primary) hypertension: Secondary | ICD-10-CM | POA: Diagnosis not present

## 2019-03-08 DIAGNOSIS — Z6832 Body mass index (BMI) 32.0-32.9, adult: Secondary | ICD-10-CM | POA: Diagnosis not present

## 2019-03-08 DIAGNOSIS — M4722 Other spondylosis with radiculopathy, cervical region: Secondary | ICD-10-CM | POA: Diagnosis not present

## 2019-03-10 DIAGNOSIS — F0631 Mood disorder due to known physiological condition with depressive features: Secondary | ICD-10-CM | POA: Diagnosis not present

## 2019-03-24 DIAGNOSIS — F0631 Mood disorder due to known physiological condition with depressive features: Secondary | ICD-10-CM | POA: Diagnosis not present

## 2019-03-31 DIAGNOSIS — F0631 Mood disorder due to known physiological condition with depressive features: Secondary | ICD-10-CM | POA: Diagnosis not present

## 2019-04-07 DIAGNOSIS — F0631 Mood disorder due to known physiological condition with depressive features: Secondary | ICD-10-CM | POA: Diagnosis not present

## 2019-04-21 DIAGNOSIS — F0631 Mood disorder due to known physiological condition with depressive features: Secondary | ICD-10-CM | POA: Diagnosis not present

## 2019-04-28 DIAGNOSIS — F0631 Mood disorder due to known physiological condition with depressive features: Secondary | ICD-10-CM | POA: Diagnosis not present

## 2019-05-12 DIAGNOSIS — F0631 Mood disorder due to known physiological condition with depressive features: Secondary | ICD-10-CM | POA: Diagnosis not present

## 2019-05-19 DIAGNOSIS — E7800 Pure hypercholesterolemia, unspecified: Secondary | ICD-10-CM | POA: Diagnosis not present

## 2019-05-19 DIAGNOSIS — R399 Unspecified symptoms and signs involving the genitourinary system: Secondary | ICD-10-CM | POA: Diagnosis not present

## 2019-05-19 DIAGNOSIS — F5101 Primary insomnia: Secondary | ICD-10-CM | POA: Diagnosis not present

## 2019-05-19 DIAGNOSIS — R7303 Prediabetes: Secondary | ICD-10-CM | POA: Diagnosis not present

## 2019-05-19 DIAGNOSIS — Z1321 Encounter for screening for nutritional disorder: Secondary | ICD-10-CM | POA: Diagnosis not present

## 2019-05-19 DIAGNOSIS — I10 Essential (primary) hypertension: Secondary | ICD-10-CM | POA: Diagnosis not present

## 2019-05-19 DIAGNOSIS — Z8673 Personal history of transient ischemic attack (TIA), and cerebral infarction without residual deficits: Secondary | ICD-10-CM | POA: Diagnosis not present

## 2019-05-19 DIAGNOSIS — Z125 Encounter for screening for malignant neoplasm of prostate: Secondary | ICD-10-CM | POA: Diagnosis not present

## 2019-05-19 DIAGNOSIS — Z131 Encounter for screening for diabetes mellitus: Secondary | ICD-10-CM | POA: Diagnosis not present

## 2019-05-26 DIAGNOSIS — F0631 Mood disorder due to known physiological condition with depressive features: Secondary | ICD-10-CM | POA: Diagnosis not present

## 2019-06-09 DIAGNOSIS — F0631 Mood disorder due to known physiological condition with depressive features: Secondary | ICD-10-CM | POA: Diagnosis not present

## 2019-06-14 DIAGNOSIS — M4722 Other spondylosis with radiculopathy, cervical region: Secondary | ICD-10-CM | POA: Diagnosis not present

## 2019-06-23 DIAGNOSIS — F0631 Mood disorder due to known physiological condition with depressive features: Secondary | ICD-10-CM | POA: Diagnosis not present

## 2019-07-07 DIAGNOSIS — F0631 Mood disorder due to known physiological condition with depressive features: Secondary | ICD-10-CM | POA: Diagnosis not present

## 2019-07-21 DIAGNOSIS — F0631 Mood disorder due to known physiological condition with depressive features: Secondary | ICD-10-CM | POA: Diagnosis not present

## 2019-09-01 DIAGNOSIS — F0631 Mood disorder due to known physiological condition with depressive features: Secondary | ICD-10-CM | POA: Diagnosis not present

## 2019-09-09 DIAGNOSIS — H40013 Open angle with borderline findings, low risk, bilateral: Secondary | ICD-10-CM | POA: Diagnosis not present

## 2019-09-09 DIAGNOSIS — H524 Presbyopia: Secondary | ICD-10-CM | POA: Diagnosis not present

## 2019-09-09 DIAGNOSIS — H25813 Combined forms of age-related cataract, bilateral: Secondary | ICD-10-CM | POA: Diagnosis not present

## 2019-09-15 DIAGNOSIS — F0631 Mood disorder due to known physiological condition with depressive features: Secondary | ICD-10-CM | POA: Diagnosis not present

## 2019-09-28 DIAGNOSIS — T83410A Breakdown (mechanical) of penile (implanted) prosthesis, initial encounter: Secondary | ICD-10-CM | POA: Diagnosis not present

## 2019-09-28 DIAGNOSIS — N5201 Erectile dysfunction due to arterial insufficiency: Secondary | ICD-10-CM | POA: Diagnosis not present

## 2019-09-29 DIAGNOSIS — F0631 Mood disorder due to known physiological condition with depressive features: Secondary | ICD-10-CM | POA: Diagnosis not present

## 2019-10-11 ENCOUNTER — Other Ambulatory Visit: Payer: Self-pay | Admitting: Thoracic Surgery (Cardiothoracic Vascular Surgery)

## 2019-10-11 DIAGNOSIS — I712 Thoracic aortic aneurysm, without rupture, unspecified: Secondary | ICD-10-CM

## 2019-10-11 DIAGNOSIS — M109 Gout, unspecified: Secondary | ICD-10-CM | POA: Diagnosis not present

## 2019-10-11 DIAGNOSIS — R7303 Prediabetes: Secondary | ICD-10-CM | POA: Diagnosis not present

## 2019-10-12 ENCOUNTER — Other Ambulatory Visit: Payer: Self-pay | Admitting: Thoracic Surgery (Cardiothoracic Vascular Surgery)

## 2019-10-12 DIAGNOSIS — I712 Thoracic aortic aneurysm, without rupture, unspecified: Secondary | ICD-10-CM

## 2019-10-13 DIAGNOSIS — I712 Thoracic aortic aneurysm, without rupture: Secondary | ICD-10-CM | POA: Diagnosis not present

## 2019-10-13 DIAGNOSIS — F5101 Primary insomnia: Secondary | ICD-10-CM | POA: Diagnosis not present

## 2019-10-13 DIAGNOSIS — E782 Mixed hyperlipidemia: Secondary | ICD-10-CM | POA: Diagnosis not present

## 2019-10-13 DIAGNOSIS — R399 Unspecified symptoms and signs involving the genitourinary system: Secondary | ICD-10-CM | POA: Diagnosis not present

## 2019-10-13 DIAGNOSIS — R7303 Prediabetes: Secondary | ICD-10-CM | POA: Diagnosis not present

## 2019-10-13 DIAGNOSIS — L03031 Cellulitis of right toe: Secondary | ICD-10-CM | POA: Diagnosis not present

## 2019-10-13 DIAGNOSIS — Z8673 Personal history of transient ischemic attack (TIA), and cerebral infarction without residual deficits: Secondary | ICD-10-CM | POA: Diagnosis not present

## 2019-10-13 DIAGNOSIS — I714 Abdominal aortic aneurysm, without rupture: Secondary | ICD-10-CM | POA: Diagnosis not present

## 2019-10-13 DIAGNOSIS — Z6832 Body mass index (BMI) 32.0-32.9, adult: Secondary | ICD-10-CM | POA: Diagnosis not present

## 2019-10-13 DIAGNOSIS — F0631 Mood disorder due to known physiological condition with depressive features: Secondary | ICD-10-CM | POA: Diagnosis not present

## 2019-10-13 DIAGNOSIS — I10 Essential (primary) hypertension: Secondary | ICD-10-CM | POA: Diagnosis not present

## 2019-10-14 ENCOUNTER — Other Ambulatory Visit: Payer: Self-pay | Admitting: Emergency Medicine

## 2019-10-14 ENCOUNTER — Other Ambulatory Visit (HOSPITAL_COMMUNITY): Payer: Self-pay | Admitting: Emergency Medicine

## 2019-10-14 DIAGNOSIS — I714 Abdominal aortic aneurysm, without rupture, unspecified: Secondary | ICD-10-CM

## 2019-10-20 DIAGNOSIS — F4312 Post-traumatic stress disorder, chronic: Secondary | ICD-10-CM | POA: Diagnosis not present

## 2019-10-27 DIAGNOSIS — F0631 Mood disorder due to known physiological condition with depressive features: Secondary | ICD-10-CM | POA: Diagnosis not present

## 2019-11-09 ENCOUNTER — Other Ambulatory Visit: Payer: Medicare Other

## 2019-11-10 ENCOUNTER — Other Ambulatory Visit: Payer: Self-pay

## 2019-11-10 ENCOUNTER — Encounter (HOSPITAL_COMMUNITY): Payer: Self-pay

## 2019-11-10 ENCOUNTER — Ambulatory Visit (HOSPITAL_COMMUNITY)
Admission: RE | Admit: 2019-11-10 | Discharge: 2019-11-10 | Disposition: A | Discharge status: Home / Self Care | Payer: Medicare Other | Source: Ambulatory Visit | Attending: Thoracic Surgery (Cardiothoracic Vascular Surgery) | Admitting: Thoracic Surgery (Cardiothoracic Vascular Surgery)

## 2019-11-10 DIAGNOSIS — I712 Thoracic aortic aneurysm, without rupture, unspecified: Secondary | ICD-10-CM

## 2019-11-10 DIAGNOSIS — I714 Abdominal aortic aneurysm, without rupture, unspecified: Secondary | ICD-10-CM

## 2019-11-10 DIAGNOSIS — F0631 Mood disorder due to known physiological condition with depressive features: Secondary | ICD-10-CM | POA: Diagnosis not present

## 2019-11-10 LAB — POCT I-STAT CREATININE: Creatinine, Ser: 0.9 mg/dL (ref 0.61–1.24)

## 2019-11-10 MED ORDER — IOHEXOL 350 MG/ML SOLN
100.0000 mL | Freq: Once | INTRAVENOUS | Status: AC | PRN
  Administered 2019-11-10: 100 mL via INTRAVENOUS

## 2019-11-15 ENCOUNTER — Other Ambulatory Visit: Payer: Self-pay

## 2019-11-15 ENCOUNTER — Other Ambulatory Visit: Payer: Medicare Other

## 2019-11-15 ENCOUNTER — Ambulatory Visit (INDEPENDENT_AMBULATORY_CARE_PROVIDER_SITE_OTHER): Payer: Medicare Other | Admitting: Thoracic Surgery (Cardiothoracic Vascular Surgery)

## 2019-11-15 VITALS — BP 114/67 | HR 40 | Temp 97.6°F | Resp 20 | Ht 73.0 in | Wt 240.0 lb

## 2019-11-15 DIAGNOSIS — I712 Thoracic aortic aneurysm, without rupture: Secondary | ICD-10-CM

## 2019-11-15 DIAGNOSIS — I7121 Aneurysm of the ascending aorta, without rupture: Secondary | ICD-10-CM
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
## 2019-11-24 DIAGNOSIS — F0631 Mood disorder due to known physiological condition with depressive features: Secondary | ICD-10-CM | POA: Diagnosis not present

## 2019-12-08 DIAGNOSIS — F0631 Mood disorder due to known physiological condition with depressive features: Secondary | ICD-10-CM | POA: Diagnosis not present

## 2019-12-16 DIAGNOSIS — R079 Chest pain, unspecified: Secondary | ICD-10-CM | POA: Diagnosis not present

## 2019-12-16 DIAGNOSIS — R0789 Other chest pain: Secondary | ICD-10-CM | POA: Diagnosis not present

## 2019-12-16 DIAGNOSIS — R001 Bradycardia, unspecified: Secondary | ICD-10-CM | POA: Diagnosis not present

## 2019-12-18 IMAGING — CT CT CHEST W/O CM
2 of 3 series · 15 of 36 positions shown, 18 images · non-contrast
Comparison: 08/10/2017

ADDENDUM:
Exam is somewhat limited secondary to the lack of IV contrast. The
ascending aorta demonstrates a measurement of 4.0 cm at the upper
limits of normal. The patient now has a given strong family history
of aneurysm formation. Recommend annual imaging followup by CTA or
MRA. This recommendation follows 1575
ACCF/AHA/AATS/ACR/ASA/SCA/KEMPU/KODJAPASHIS/NYA/STEVEN O Guidelines for the
Diagnosis and Management of Patients with Thoracic Aortic Disease.
Circulation. 1575; 121: e266-e369
CLINICAL DATA: Follow-up left lower lobe nodular changes

EXAM:
CT CHEST WITHOUT CONTRAST
TECHNIQUE: Multidetector CT imaging of the chest was performed following the
standard protocol without IV contrast.

[Series 2: thorax · axial · 0.71mm/px · z∈[+1252,+1534]mm · 12 of 167 slices shown, 15 images]
[im 13/167  mediastinal]
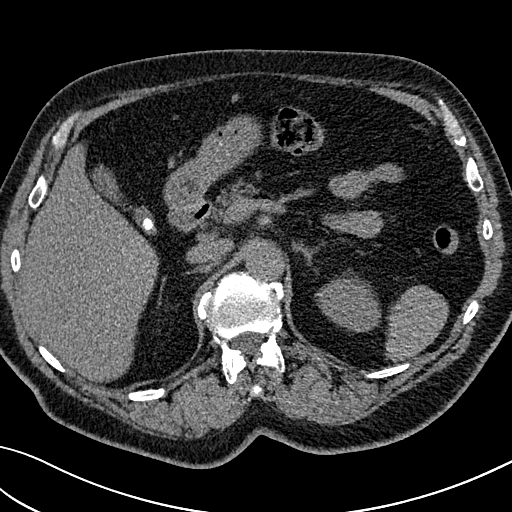
[im 13/167  lung]
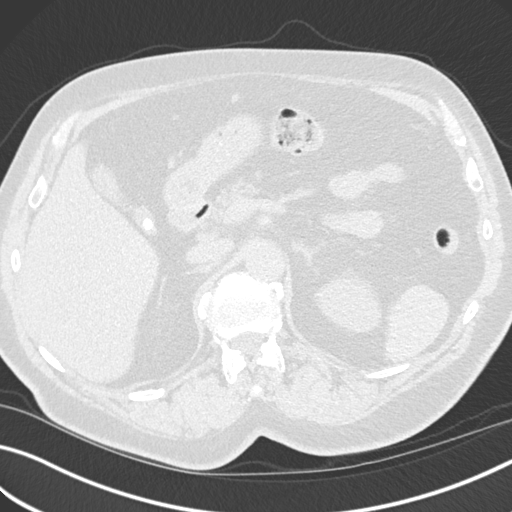
[im 25/167  lung]
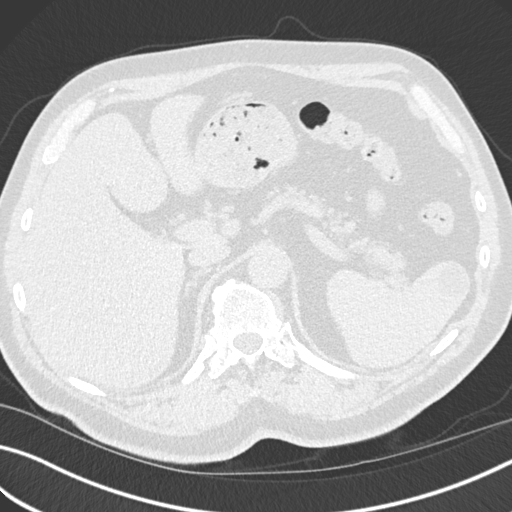
[im 37/167  lung]
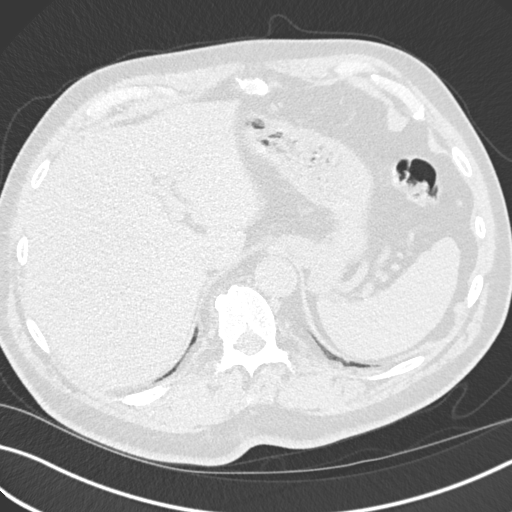
[im 50/167  lung]
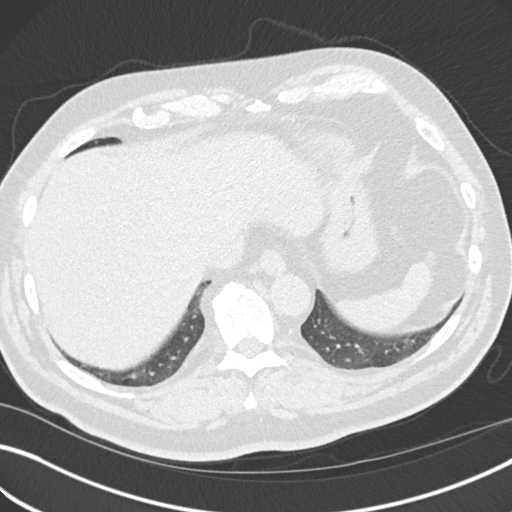
[im 62/167  mediastinal]
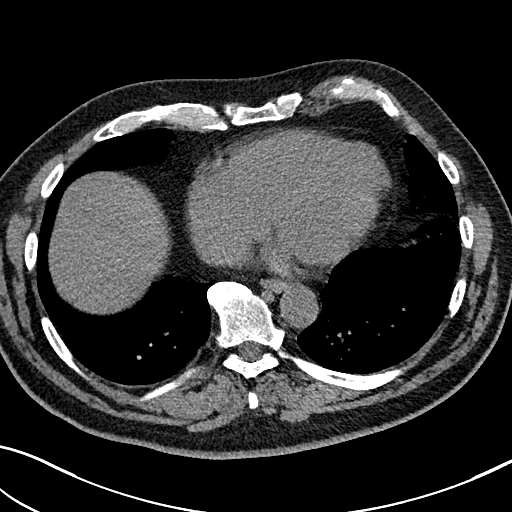
[im 62/167  lung]
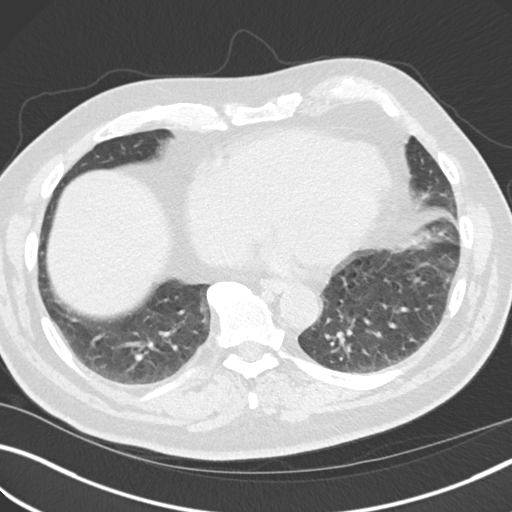
[im 74/167  lung]
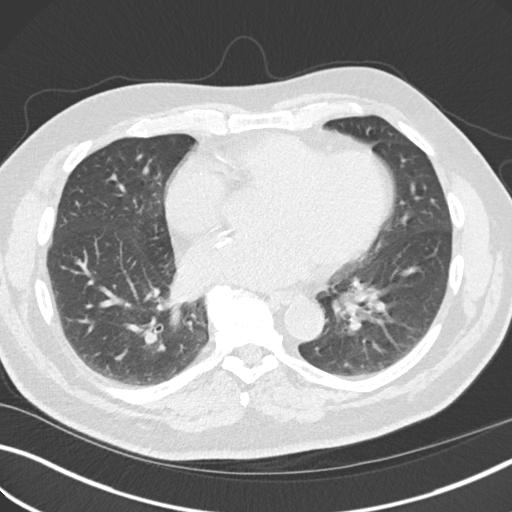
[im 93/167  lung]
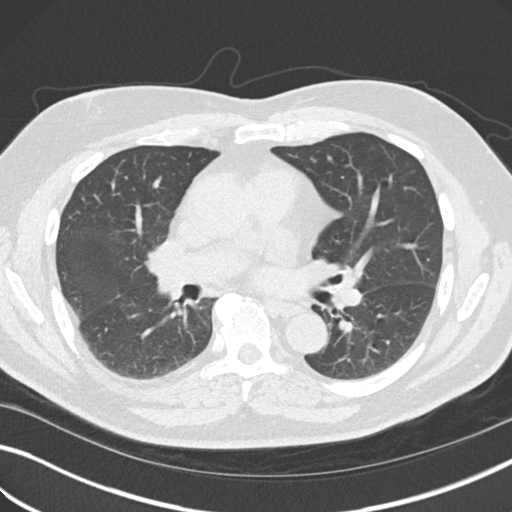
[im 105/167  lung]
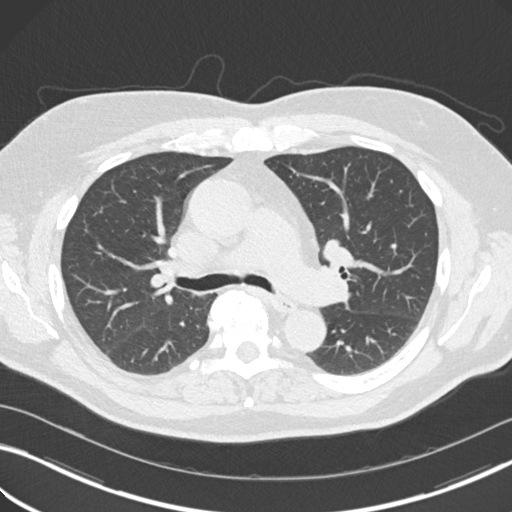
[im 117/167  mediastinal]
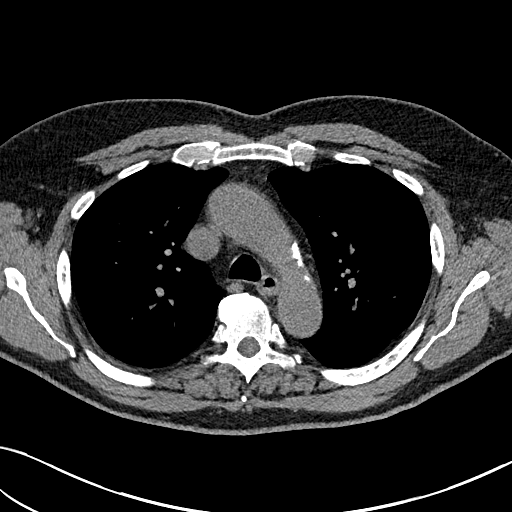
[im 117/167  lung]
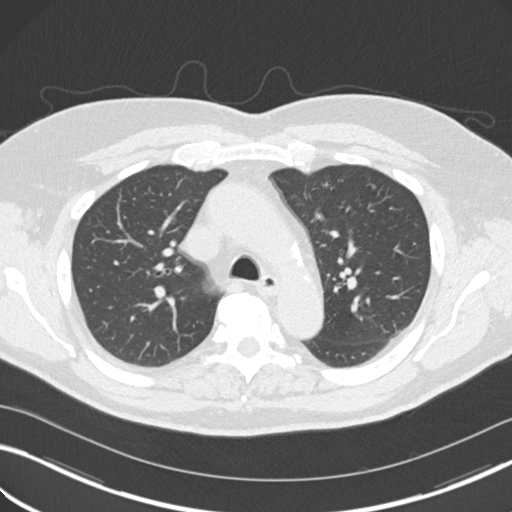
[im 130/167  lung]
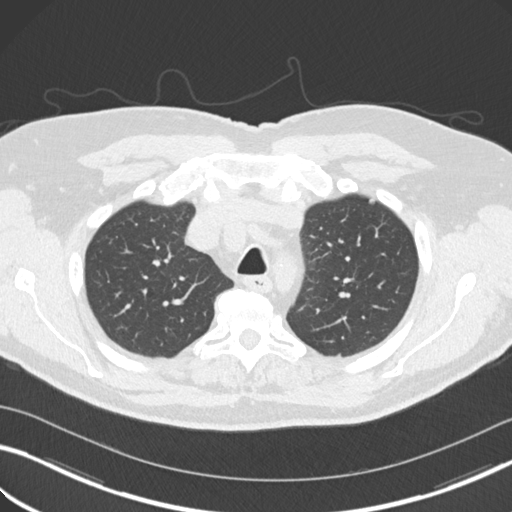
[im 142/167  lung]
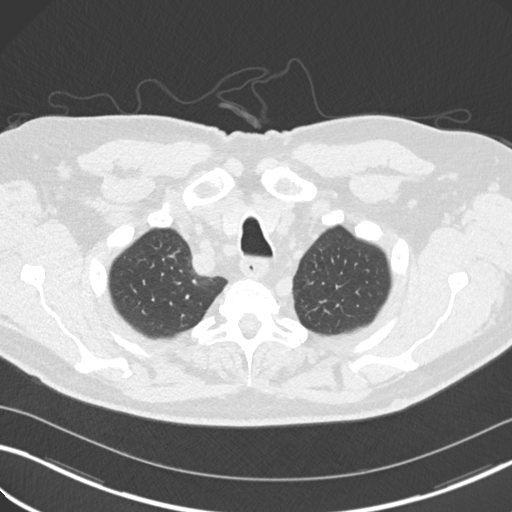
[im 154/167  lung]
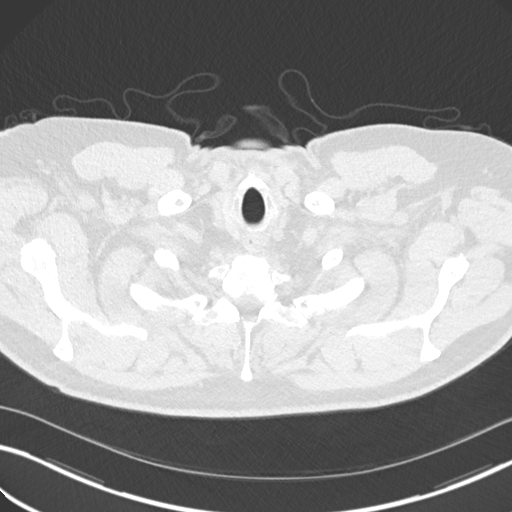

[Series 5: coronal · coronal · 0.65mm/px · 3 of 150 slices shown]
[im 30/150  lung]
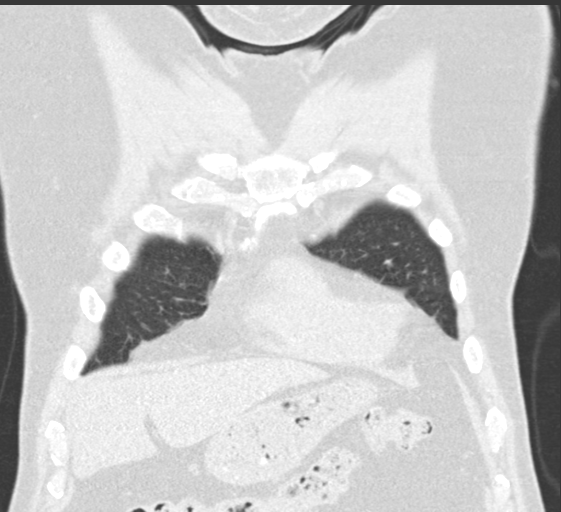
[im 60/150  lung]
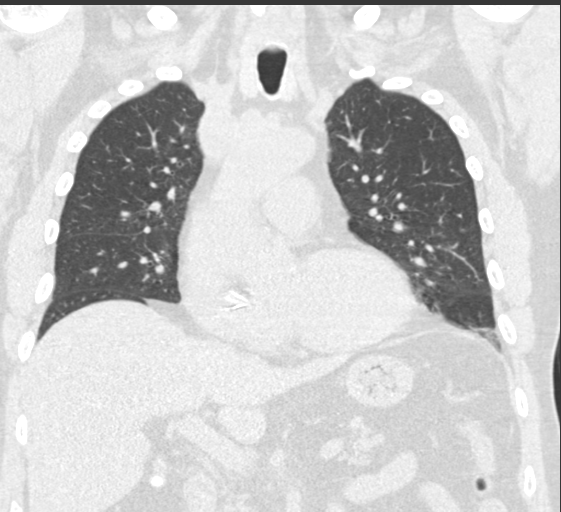
[im 90/150  lung]
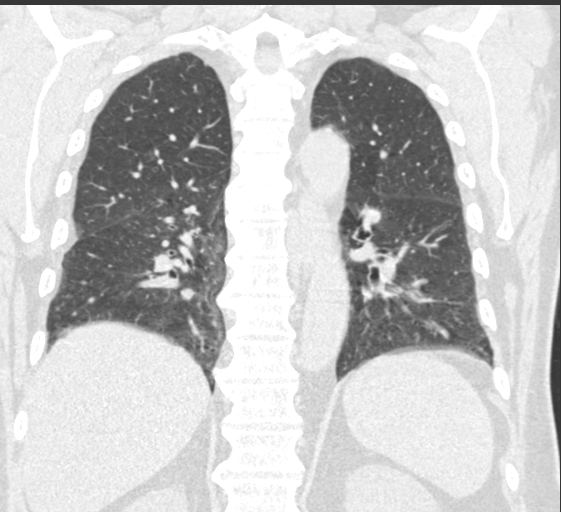

[15 of 36 positions shown; findings below may reference images not displayed]

FINDINGS: Cardiovascular: Somewhat limited due to the lack of IV contrast.
Aortic atherosclerotic changes are noted without aneurysmal
dilatation. Mild cardiac enlargement is noted. Coronary
calcifications are seen.

Mediastinum/Nodes: The thoracic inlet is within normal limits. No
hilar or mediastinal adenopathy is noted. The esophagus is within
normal limits.

Lungs/Pleura: The lungs are well aerated bilaterally. No acute
infiltrate or sizable effusion is noted. The previously seen changes
in the superior segment of the left lower lobe have resolved
consistent with prior inflammatory change. No residual scarring is
noted. No parenchymal nodules are seen.

Upper Abdomen: Cholelithiasis is noted, not well appreciated on
prior CT exam. The remainder of the upper abdomen is stable with
hypodensities in both the spleen and liver stable from previous
exams.

Musculoskeletal: Degenerative changes of the thoracic spine are
noted. No acute bony abnormality is seen.
IMPRESSION: Resolution of previously seen left lower lobe nodular changes
consistent with resolved inflammatory change.

No acute abnormality noted.

Cholelithiasis

Aortic Atherosclerosis ([id number].O).

## 2019-12-19 ENCOUNTER — Emergency Department (HOSPITAL_COMMUNITY)
Admission: EM | Admit: 2019-12-19 | Discharge: 2019-12-19 | Disposition: A | Discharge status: Home / Self Care | Payer: Medicare Other | Attending: Emergency Medicine | Admitting: Emergency Medicine

## 2019-12-19 ENCOUNTER — Encounter (HOSPITAL_COMMUNITY): Payer: Self-pay | Admitting: *Deleted

## 2019-12-19 ENCOUNTER — Other Ambulatory Visit: Payer: Self-pay

## 2019-12-19 ENCOUNTER — Emergency Department (HOSPITAL_COMMUNITY): Payer: Medicare Other

## 2019-12-19 DIAGNOSIS — J449 Chronic obstructive pulmonary disease, unspecified: Secondary | ICD-10-CM | POA: Insufficient documentation

## 2019-12-19 DIAGNOSIS — R0602 Shortness of breath: Secondary | ICD-10-CM | POA: Diagnosis not present

## 2019-12-19 DIAGNOSIS — J90 Pleural effusion, not elsewhere classified: Secondary | ICD-10-CM | POA: Diagnosis not present

## 2019-12-19 DIAGNOSIS — R079 Chest pain, unspecified: Secondary | ICD-10-CM | POA: Diagnosis not present

## 2019-12-19 DIAGNOSIS — I712 Thoracic aortic aneurysm, without rupture: Secondary | ICD-10-CM | POA: Diagnosis not present

## 2019-12-19 DIAGNOSIS — R001 Bradycardia, unspecified: Secondary | ICD-10-CM | POA: Diagnosis not present

## 2019-12-19 DIAGNOSIS — I714 Abdominal aortic aneurysm, without rupture: Secondary | ICD-10-CM | POA: Insufficient documentation

## 2019-12-19 DIAGNOSIS — Z87891 Personal history of nicotine dependence: Secondary | ICD-10-CM | POA: Diagnosis not present

## 2019-12-19 DIAGNOSIS — Z86010 Personal history of colonic polyps: Secondary | ICD-10-CM | POA: Diagnosis not present

## 2019-12-19 DIAGNOSIS — I10 Essential (primary) hypertension: Secondary | ICD-10-CM | POA: Diagnosis not present

## 2019-12-19 DIAGNOSIS — R0789 Other chest pain: Secondary | ICD-10-CM | POA: Diagnosis not present

## 2019-12-19 DIAGNOSIS — Z8673 Personal history of transient ischemic attack (TIA), and cerebral infarction without residual deficits: Secondary | ICD-10-CM | POA: Diagnosis not present

## 2019-12-19 DIAGNOSIS — Z7982 Long term (current) use of aspirin: Secondary | ICD-10-CM | POA: Diagnosis not present

## 2019-12-19 LAB — COMPREHENSIVE METABOLIC PANEL
ALT: 18 U/L (ref 0–44)
AST: 14 U/L — ABNORMAL LOW (ref 15–41)
Albumin: 4.3 g/dL (ref 3.5–5.0)
Alkaline Phosphatase: 64 U/L (ref 38–126)
Anion gap: 10 (ref 5–15)
BUN: 28 mg/dL — ABNORMAL HIGH (ref 8–23)
CO2: 27 mmol/L (ref 22–32)
Calcium: 9.1 mg/dL (ref 8.9–10.3)
Chloride: 98 mmol/L (ref 98–111)
Creatinine, Ser: 0.85 mg/dL (ref 0.61–1.24)
GFR, Estimated: >60 mL/min (ref >60)
Glucose, Bld: 91 mg/dL (ref 70–99)
Potassium: 3.3 mmol/L — ABNORMAL LOW (ref 3.5–5.1)
Sodium: 135 mmol/L (ref 135–145)
Total Bilirubin: 1.1 mg/dL (ref 0.3–1.2)
Total Protein: 6.6 g/dL (ref 6.5–8.1)

## 2019-12-19 LAB — CBC WITH DIFFERENTIAL/PLATELET
Abs Immature Granulocytes: 0.02 10*3/uL (ref 0.00–0.07)
Basophils Absolute: 0.1 10*3/uL (ref 0.0–0.1)
Basophils Relative: 1 %
Eosinophils Absolute: 0.1 10*3/uL (ref 0.0–0.5)
Eosinophils Relative: 2 %
HCT: 43.3 % (ref 39.0–52.0)
Hemoglobin: 15 g/dL (ref 13.0–17.0)
Immature Granulocytes: 0 %
Lymphocytes Relative: 25 %
Lymphs Abs: 1.9 10*3/uL (ref 0.7–4.0)
MCH: 33 pg (ref 26.0–34.0)
MCHC: 34.6 g/dL (ref 30.0–36.0)
MCV: 95.4 fL (ref 80.0–100.0)
Monocytes Absolute: 0.7 10*3/uL (ref 0.1–1.0)
Monocytes Relative: 9 %
Neutro Abs: 4.9 10*3/uL (ref 1.7–7.7)
Neutrophils Relative %: 63 %
Platelets: 238 10*3/uL (ref 150–400)
RBC: 4.54 MIL/uL (ref 4.22–5.81)
RDW: 11.9 % (ref 11.5–15.5)
WBC: 7.6 10*3/uL (ref 4.0–10.5)
nRBC: 0 % (ref 0.0–0.2)

## 2019-12-19 LAB — BRAIN NATRIURETIC PEPTIDE: B Natriuretic Peptide: 94 pg/mL (ref 0.0–100.0)

## 2019-12-19 LAB — TROPONIN I (HIGH SENSITIVITY)
Troponin I (High Sensitivity): 5 ng/L (ref <18)
Troponin I (High Sensitivity): 5 ng/L (ref <18)

## 2019-12-19 NOTE — ED Triage Notes (Signed)
Slow heart rate for the past 5 days, c/o chest pressure

## 2019-12-19 NOTE — ED Provider Notes (Signed)
  Face-to-face evaluation   History: He presents for evaluation of a sensation of slow heartbeat.  He states that this is constant over the last week and he checks it at home with a monitor.  He also has a feeling of chest pain occasionally, dizziness, weakness and has fallen.  He denies prior problems similar to this.  Physical exam: Alert elderly male, who is irritable.  No dysarthria or aphasia.  No respiratory distress.  Heart rate 56 on the monitor at the time I saw him, 10:45 PM.  Medical screening examination/treatment/procedure(s) were conducted as a shared visit with non-physician practitioner(s) and myself.  I personally evaluated the patient during the encounter    Daleen Bo, MD 12/20/19 1219

## 2019-12-19 NOTE — Discharge Instructions (Signed)
As we discussed, we think your low heart rate is being caused by the high dose of metoprolol that you're on. Please cut the dose of metoprolol to 100 mg daily.  Follow-up with your primary care doctor as well as Dr. Johnsie Cancel.  Return the emergency department for any chest pain, difficulty breathing, vomiting, any other worsening concerning symptoms.
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
## 2019-12-22 DIAGNOSIS — F0631 Mood disorder due to known physiological condition with depressive features: Secondary | ICD-10-CM | POA: Diagnosis not present

## 2019-12-26 DIAGNOSIS — E7800 Pure hypercholesterolemia, unspecified: Secondary | ICD-10-CM | POA: Diagnosis not present

## 2019-12-29 DIAGNOSIS — E876 Hypokalemia: Secondary | ICD-10-CM | POA: Diagnosis not present

## 2019-12-29 DIAGNOSIS — I10 Essential (primary) hypertension: Secondary | ICD-10-CM | POA: Diagnosis not present

## 2019-12-29 DIAGNOSIS — R001 Bradycardia, unspecified: Secondary | ICD-10-CM | POA: Diagnosis not present

## 2019-12-29 DIAGNOSIS — R7303 Prediabetes: Secondary | ICD-10-CM | POA: Diagnosis not present

## 2019-12-29 DIAGNOSIS — E782 Mixed hyperlipidemia: Secondary | ICD-10-CM | POA: Diagnosis not present

## 2020-01-05 DIAGNOSIS — N5201 Erectile dysfunction due to arterial insufficiency: Secondary | ICD-10-CM | POA: Diagnosis not present

## 2020-01-05 DIAGNOSIS — F0631 Mood disorder due to known physiological condition with depressive features: Secondary | ICD-10-CM | POA: Diagnosis not present

## 2020-01-19 DIAGNOSIS — F3341 Major depressive disorder, recurrent, in partial remission: Secondary | ICD-10-CM | POA: Diagnosis not present
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
## 2020-01-24 ENCOUNTER — Ambulatory Visit (INDEPENDENT_AMBULATORY_CARE_PROVIDER_SITE_OTHER): Payer: Medicare Other | Admitting: Cardiovascular Disease

## 2020-01-24 ENCOUNTER — Other Ambulatory Visit: Payer: Self-pay

## 2020-01-24 ENCOUNTER — Encounter: Payer: Self-pay | Admitting: Cardiovascular Disease

## 2020-01-24 VITALS — BP 118/60 | HR 53 | Ht 73.0 in | Wt 237.6 lb

## 2020-01-24 DIAGNOSIS — R001 Bradycardia, unspecified: Secondary | ICD-10-CM | POA: Diagnosis not present

## 2020-01-24 DIAGNOSIS — R079 Chest pain, unspecified: Secondary | ICD-10-CM

## 2020-01-24 MED ORDER — METOPROLOL SUCCINATE 25 MG PO CS24
1.0000 | EXTENDED_RELEASE_CAPSULE | ORAL | 3 refills | Status: DC
Start: 2020-01-24 — End: 2021-08-19

## 2020-01-24 NOTE — Patient Instructions (Signed)
Medication Instructions:  Your physician has recommended you make the following change in your medication:  1-Decrease Metoprolol 25 mg by mouth every other day.  *If you need a refill on your cardiac medications before your next appointment, please call your pharmacy*   Lab Work: If you have labs (blood work) drawn today and your tests are completely normal, you will receive your results only by: Marland Kitchen MyChart Message (if you have MyChart) OR . A paper copy in the mail If you have any lab test that is abnormal or we need to change your treatment, we will call you to review the results.  Testing/Procedures: Your physician has requested that you have en exercise stress myoview. For further information please visit [email or web address]. Please follow instruction sheet, as given.  Follow-Up: At [facility], you and your health needs are our priority.  As part of our continuing mission to provide you with exceptional heart care, we have created designated Provider Care Teams.  These Care Teams include your primary Cardiologist (physician) and Advanced Practice Providers (APPs -  Physician Assistants and Nurse Practitioners) who all work together to provide you with the care you need, when you need it.  We recommend signing up for the patient portal called "MyChart".  Sign up information is provided on this After Visit Summary.  MyChart is used to connect with patients for Virtual Visits (Telemedicine).  Patients are able to view lab/test results, encounter notes, upcoming appointments, etc.  Non-urgent messages can be sent to your provider as well.   To learn more about what you can do with MyChart, go to [email or web address].    Your next appointment:   3 month(s)  The format for your next appointment:   In Person  Provider:   Jenkins Rouge, MD in [place]

## 2020-01-26 DIAGNOSIS — R001 Bradycardia, unspecified: Secondary | ICD-10-CM | POA: Diagnosis not present

## 2020-01-26 DIAGNOSIS — R7303 Prediabetes: Secondary | ICD-10-CM | POA: Diagnosis not present

## 2020-01-26 DIAGNOSIS — E782 Mixed hyperlipidemia: Secondary | ICD-10-CM | POA: Diagnosis not present

## 2020-01-26 DIAGNOSIS — F0631 Mood disorder due to known physiological condition with depressive features: Secondary | ICD-10-CM | POA: Diagnosis not present

## 2020-01-26 DIAGNOSIS — I10 Essential (primary) hypertension: Secondary | ICD-10-CM | POA: Diagnosis not present

## 2020-02-01 ENCOUNTER — Encounter (HOSPITAL_COMMUNITY): Payer: Self-pay | Admitting: *Deleted

## 2020-02-01 ENCOUNTER — Telehealth (HOSPITAL_COMMUNITY): Payer: Self-pay | Admitting: *Deleted

## 2020-02-01 ENCOUNTER — Other Ambulatory Visit (HOSPITAL_COMMUNITY): Payer: Medicare Other

## 2020-02-01 NOTE — Telephone Encounter (Signed)
Letter sent via MyChart outlining instructions for upcoming stress test on 02/06/20.

## 2020-02-02 ENCOUNTER — Other Ambulatory Visit (HOSPITAL_COMMUNITY): Payer: Medicare Other

## 2020-02-03 ENCOUNTER — Other Ambulatory Visit (HOSPITAL_COMMUNITY)
Admission: RE | Admit: 2020-02-03 | Discharge: 2020-02-03 | Disposition: A | Discharge status: Home / Self Care | Payer: Medicare Other | Source: Ambulatory Visit | Attending: Cardiovascular Disease | Admitting: Cardiovascular Disease

## 2020-02-03 DIAGNOSIS — Z01812 Encounter for preprocedural laboratory examination: Secondary | ICD-10-CM | POA: Diagnosis not present

## 2020-02-03 DIAGNOSIS — Z20822 Contact with and (suspected) exposure to covid-19: Secondary | ICD-10-CM | POA: Insufficient documentation

## 2020-02-04 LAB — SARS CORONAVIRUS 2 (TAT 6-24 HRS): SARS Coronavirus 2: NEGATIVE

## 2020-02-06 ENCOUNTER — Ambulatory Visit (HOSPITAL_COMMUNITY): Payer: Medicare Other | Attending: Cardiology

## 2020-02-06 DIAGNOSIS — R079 Chest pain, unspecified: Secondary | ICD-10-CM | POA: Diagnosis not present

## 2020-02-06 LAB — MYOCARDIAL PERFUSION IMAGING
Estimated workload: 7.4 METS
Exercise duration (min): 6 min
Exercise duration (sec): 16 s
LV dias vol: 86 mL (ref 62–150)
LV sys vol: 29 mL
MPHR: 149 {beats}/min
Peak HR: 112 {beats}/min
Percent HR: 75 %
RPE: 20
Rest HR: 66 {beats}/min
SDS: 3
SRS: 1
SSS: 4
TID: 0.86

## 2020-02-06 MED ORDER — TECHNETIUM TC 99M TETROFOSMIN IV KIT
10.8000 | PACK | Freq: Once | INTRAVENOUS | Status: AC | PRN
  Administered 2020-02-06: 10.8 via INTRAVENOUS
  Filled 2020-02-06: qty 11

## 2020-02-06 MED ORDER — REGADENOSON 0.4 MG/5ML IV SOLN
0.4000 mg | Freq: Once | INTRAVENOUS | Status: AC
  Administered 2020-02-06: 0.4 mg via INTRAVENOUS

## 2020-02-06 MED ORDER — TECHNETIUM TC 99M TETROFOSMIN IV KIT
32.1000 | PACK | Freq: Once | INTRAVENOUS | Status: AC | PRN
  Administered 2020-02-06: 32.1 via INTRAVENOUS
  Filled 2020-02-06: qty 33

## 2020-02-09 DIAGNOSIS — F0631 Mood disorder due to known physiological condition with depressive features: Secondary | ICD-10-CM | POA: Diagnosis not present

## 2020-04-27 ENCOUNTER — Ambulatory Visit: Payer: Medicare Other | Admitting: Cardiovascular Disease
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
## 2020-05-28 ENCOUNTER — Other Ambulatory Visit: Payer: Self-pay

## 2020-05-28 ENCOUNTER — Ambulatory Visit (INDEPENDENT_AMBULATORY_CARE_PROVIDER_SITE_OTHER): Payer: Medicare Other | Admitting: Cardiovascular Disease

## 2020-05-28 ENCOUNTER — Encounter: Payer: Self-pay | Admitting: Cardiovascular Disease

## 2020-05-28 VITALS — BP 144/88 | HR 64 | Ht 73.0 in | Wt 240.0 lb

## 2020-05-28 DIAGNOSIS — Q211 Atrial septal defect: Secondary | ICD-10-CM | POA: Diagnosis not present

## 2020-05-28 DIAGNOSIS — Q2112 Patent foramen ovale: Secondary | ICD-10-CM

## 2020-05-28 DIAGNOSIS — R001 Bradycardia, unspecified: Secondary | ICD-10-CM | POA: Diagnosis not present

## 2020-05-28 DIAGNOSIS — I10 Essential (primary) hypertension: Secondary | ICD-10-CM

## 2020-05-28 NOTE — Patient Instructions (Signed)
Medication Instructions:  Your physician recommends that you continue on your current medications as directed. Please refer to the Current Medication list given to you today.  *If you need a refill on your cardiac medications before your next appointment, please call your pharmacy*   Lab Work: None today  If you have labs (blood work) drawn today and your tests are completely normal, you will receive your results only by: MyChart Message (if you have MyChart) OR A paper copy in the mail If you have any lab test that is abnormal or we need to change your treatment, we will call you to review the results.   Testing/Procedures: None today    Follow-Up: At [facility], you and your health needs are our priority.  As part of our continuing mission to provide you with exceptional heart care, we have created designated Provider Care Teams.  These Care Teams include your primary Cardiologist (physician) and Advanced Practice Providers (APPs -  Physician Assistants and Nurse Practitioners) who all work together to provide you with the care you need, when you need it.  We recommend signing up for the patient portal called "MyChart".  Sign up information is provided on this After Visit Summary.  MyChart is used to connect with patients for Virtual Visits (Telemedicine).  Patients are able to view lab/test results, encounter notes, upcoming appointments, etc.  Non-urgent messages can be sent to your provider as well.   To learn more about what you can do with MyChart, go to [email or web address].    Your next appointment:   12 month(s)  The format for your next appointment:   In Person  Provider:   Jonathan Branch, MD   Other Instructions None   

## 2020-09-18 ENCOUNTER — Other Ambulatory Visit: Payer: Self-pay | Admitting: *Deleted

## 2020-09-18 DIAGNOSIS — I712 Thoracic aortic aneurysm, without rupture, unspecified: Secondary | ICD-10-CM

## 2020-09-25 ENCOUNTER — Other Ambulatory Visit: Payer: Self-pay | Admitting: Emergency Medicine

## 2020-09-25 DIAGNOSIS — I714 Abdominal aortic aneurysm, without rupture, unspecified: Secondary | ICD-10-CM

## 2020-11-13 ENCOUNTER — Ambulatory Visit
Admission: RE | Admit: 2020-11-13 | Discharge: 2020-11-13 | Disposition: A | Discharge status: Home / Self Care | Payer: Medicare Other | Source: Ambulatory Visit | Attending: Emergency Medicine | Admitting: Emergency Medicine

## 2020-11-13 ENCOUNTER — Other Ambulatory Visit: Payer: Medicare Other

## 2020-11-13 ENCOUNTER — Encounter: Payer: Medicare Other | Admitting: Thoracic Surgery (Cardiothoracic Vascular Surgery)

## 2020-11-13 DIAGNOSIS — I714 Abdominal aortic aneurysm, without rupture, unspecified: Secondary | ICD-10-CM

## 2020-11-13 MED ORDER — IOPAMIDOL (ISOVUE-370) INJECTION 76%
75.0000 mL | Freq: Once | INTRAVENOUS | Status: AC | PRN
  Administered 2020-11-13: 75 mL via INTRAVENOUS

## 2020-11-27 ENCOUNTER — Encounter: Payer: Medicare Other | Admitting: Thoracic Surgery (Cardiothoracic Vascular Surgery)

## 2020-12-04 ENCOUNTER — Encounter: Payer: Medicare Other | Admitting: Thoracic Surgery (Cardiothoracic Vascular Surgery)

## 2020-12-11 ENCOUNTER — Encounter: Payer: Self-pay | Admitting: Thoracic Surgery (Cardiothoracic Vascular Surgery)

## 2020-12-11 ENCOUNTER — Other Ambulatory Visit: Payer: Self-pay

## 2020-12-11 ENCOUNTER — Ambulatory Visit (INDEPENDENT_AMBULATORY_CARE_PROVIDER_SITE_OTHER): Payer: Medicare Other | Admitting: Thoracic Surgery (Cardiothoracic Vascular Surgery)

## 2020-12-11 VITALS — BP 130/76 | HR 61 | Resp 20 | Ht 73.0 in | Wt 228.0 lb

## 2020-12-11 DIAGNOSIS — I7121 Aneurysm of the ascending aorta, without rupture: Secondary | ICD-10-CM
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
## 2021-04-16 ENCOUNTER — Emergency Department (HOSPITAL_COMMUNITY): Payer: Medicare Other

## 2021-04-16 ENCOUNTER — Encounter (HOSPITAL_COMMUNITY): Payer: Self-pay | Admitting: Emergency Medicine

## 2021-04-16 ENCOUNTER — Emergency Department (HOSPITAL_COMMUNITY)
Admission: EM | Admit: 2021-04-16 | Discharge: 2021-04-16 | Disposition: A | Discharge status: Home / Self Care | Payer: Medicare Other | Attending: Emergency Medicine | Admitting: Emergency Medicine

## 2021-04-16 ENCOUNTER — Other Ambulatory Visit: Payer: Self-pay

## 2021-04-16 DIAGNOSIS — J449 Chronic obstructive pulmonary disease, unspecified: Secondary | ICD-10-CM | POA: Insufficient documentation

## 2021-04-16 DIAGNOSIS — W19XXXA Unspecified fall, initial encounter: Secondary | ICD-10-CM

## 2021-04-16 DIAGNOSIS — R911 Solitary pulmonary nodule: Secondary | ICD-10-CM | POA: Diagnosis not present

## 2021-04-16 DIAGNOSIS — S3992XA Unspecified injury of lower back, initial encounter: Secondary | ICD-10-CM | POA: Diagnosis present

## 2021-04-16 DIAGNOSIS — K7689 Other specified diseases of liver: Secondary | ICD-10-CM | POA: Insufficient documentation

## 2021-04-16 DIAGNOSIS — S300XXA Contusion of lower back and pelvis, initial encounter: Secondary | ICD-10-CM | POA: Diagnosis not present

## 2021-04-16 DIAGNOSIS — Z7982 Long term (current) use of aspirin: Secondary | ICD-10-CM | POA: Diagnosis not present

## 2021-04-16 DIAGNOSIS — W541XXA Struck by dog, initial encounter: Secondary | ICD-10-CM | POA: Diagnosis not present

## 2021-04-16 DIAGNOSIS — S0990XA Unspecified injury of head, initial encounter: Secondary | ICD-10-CM | POA: Diagnosis not present

## 2021-04-16 DIAGNOSIS — R39198 Other difficulties with micturition: Secondary | ICD-10-CM | POA: Insufficient documentation

## 2021-04-16 LAB — I-STAT CHEM 8, ED
BUN: 25 mg/dL — ABNORMAL HIGH (ref 8–23)
Calcium, Ion: 1.16 mmol/L (ref 1.15–1.40)
Chloride: 100 mmol/L (ref 98–111)
Creatinine, Ser: 1 mg/dL (ref 0.61–1.24)
Glucose, Bld: 113 mg/dL — ABNORMAL HIGH (ref 70–99)
HCT: 39 % (ref 39.0–52.0)
Hemoglobin: 13.3 g/dL (ref 13.0–17.0)
Potassium: 3.9 mmol/L (ref 3.5–5.1)
Sodium: 139 mmol/L (ref 135–145)
TCO2: 33 mmol/L — ABNORMAL HIGH (ref 22–32)

## 2021-04-16 MED ORDER — IOHEXOL 300 MG/ML  SOLN
100.0000 mL | Freq: Once | INTRAMUSCULAR | Status: AC | PRN
  Administered 2021-04-16: 100 mL via INTRAVENOUS

## 2021-04-16 MED ORDER — HYDROMORPHONE HCL 1 MG/ML IJ SOLN
0.5000 mg | Freq: Once | INTRAMUSCULAR | Status: AC
  Administered 2021-04-16: 0.5 mg via INTRAVENOUS
  Filled 2021-04-16: qty 1

## 2021-04-16 MED ORDER — HYDROMORPHONE HCL 1 MG/ML IJ SOLN
1.0000 mg | Freq: Once | INTRAMUSCULAR | Status: AC
  Administered 2021-04-16: 1 mg via INTRAVENOUS
  Filled 2021-04-16: qty 1

## 2021-04-16 MED ORDER — ONDANSETRON HCL 4 MG/2ML IJ SOLN
4.0000 mg | Freq: Once | INTRAMUSCULAR | Status: AC
  Administered 2021-04-16: 4 mg via INTRAVENOUS
  Filled 2021-04-16: qty 2

## 2021-04-16 MED ORDER — OXYCODONE-ACETAMINOPHEN 5-325 MG PO TABS: 1.0000 | ORAL_TABLET | Freq: Three times a day (TID) | ORAL | 0 refills | Status: AC | PRN

## 2021-04-16 NOTE — ED Provider Notes (Signed)
[facility] EMERGENCY DEPARTMENT Provider Note   CSN: [id number] Arrival date & time: 04/16/21  1133     History  Chief Complaint  Patient presents with   [name] is a 71 y.o. male.  HPI  Patient with medical history including thoracic AAA COPD presents after a fall.  Patient states last night he was going to let out his dog and his dog got in front of him causing him to trip and slide down the stairs, states he slid down about approximately 8 steps then landed onto his face.  He denies any loss of conscious, is not on anticoag, denies any headaches change in vision paresthesia weakness upper lower extremities.  He states that he has pain mainly in his gluteus area, pain is worse with movement improved with rest, he states he has slight difficulty with urination, states he has a urinary stimulator placed about 10 years ago, states he urinated a little bit today, denies dysuria hematuria flank tenderness nausea vomit diarrhea.  He has no other complaints at this time.  He states that he was able to get up and ambulate after the incident.  Home Medications Prior to Admission medications   Medication Sig Start Date End Date Taking? Authorizing Provider  oxyCODONE-acetaminophen (PERCOCET/ROXICET) 5-325 MG tablet Take 1 tablet by mouth every 8 (eight) hours as needed for up to 4 days for severe pain. 04/16/21 04/20/21 Yes Marcello Fennel, PA-C  acetaminophen (TYLENOL) 325 MG tablet Take 650 mg by mouth every 6 (six) hours as needed for mild pain.     [provider]  aspirin 325 MG tablet Take 325 mg by mouth daily.    [provider]  chlorthalidone (HYGROTON) 25 MG tablet Take 25 mg by mouth daily.    [provider]  clopidogrel (PLAVIX) 75 MG tablet Take 75 mg by mouth daily.    [provider]  ezetimibe (ZETIA) 10 MG tablet Take 10 mg by mouth daily. 11/10/19   [provider]  lamoTRIgine (LAMICTAL) 200 MG tablet Take  200 mg by mouth 2 (two) times daily.  08/12/17   [provider]  losartan (COZAAR) 50 MG tablet Take 100 mg by mouth daily. 10/11/18   [provider]  Melatonin 5 MG TABS Take 5 mg by mouth at bedtime.    [provider]  Metoprolol Succinate 25 MG CS24 Take 1 tablet by mouth every other day. 01/24/20   Josue Hector, MD  mirtazapine (REMERON) 45 MG tablet Take 45 mg by mouth at bedtime. 10/06/17   [provider]  Omega-3 Fatty Acids (SM OMEGA-3 FISH OIL PO) Take 1 capsule by mouth daily.    [provider]  traZODone (DESYREL) 50 MG tablet Take 50 mg by mouth at bedtime.  12/10/17   [provider]      Allergies    Aricept Reather Littler hcl], Bee venom, Namenda [memantine hcl], Quinine derivatives, Statins, Divalproex sodium, and Oxcarbazepine    Review of Systems   Review of Systems  Constitutional:  Negative for chills and fever.  Respiratory:  Negative for shortness of breath.   Cardiovascular:  Negative for chest pain.  Gastrointestinal:  Negative for abdominal pain.  Musculoskeletal:  Negative for back pain and neck pain.       Pain in the gluteus  Neurological:  Negative for headaches.   Physical Exam Updated Vital Signs BP (!) 120/97    Pulse 69    Temp  98.4 F (36.9 C) (Oral)    Resp 18    Ht 6\' 1"  (1.854 m)    Wt 105.2 kg    SpO2 92%    BMI 30.61 kg/m  Physical Exam Vitals and nursing note reviewed. Exam conducted with a chaperone present.  Constitutional:      General: He is not in acute distress.    Appearance: He is not ill-appearing.  HENT:     Head: Normocephalic and atraumatic.     Comments: No deformity of the head present, no raccoon eyes or battle sign noted.    Nose: No congestion.     Mouth/Throat:     Mouth: Mucous membranes are moist.     Pharynx: Oropharynx is clear. No oropharyngeal exudate or posterior oropharyngeal erythema.     Comments: No trismus no torticollis, no oral trauma present. Eyes:      Conjunctiva/sclera: Conjunctivae normal.  Cardiovascular:     Rate and Rhythm: Normal rate and regular rhythm.     Pulses: Normal pulses.     Heart sounds: No murmur heard.   No friction rub. No gallop.  Pulmonary:     Effort: No respiratory distress.     Breath sounds: No wheezing, rhonchi or rales.  Chest:     Chest wall: No tenderness.  Abdominal:     Palpations: Abdomen is soft.     Tenderness: There is abdominal tenderness. There is no right CVA tenderness or left CVA tenderness.     Comments: Abdomen nondistended normal bowel sounds, dull to percussion, no ecchymosis noted on my exam, he had slight pain on the suprapubic region, no guarding, rebound tenderness, peritoneal sign.  Genitourinary:    Comments: Patient had noted ecchymosis on his right gluteus going down to his peritoneum and slightly on the posterior aspect of his scrotum, testicles are nontender to location no gross abdomen is present. Musculoskeletal:     Cervical back: No rigidity.     Comments: Spine was palpated he has tenderness to palpation along his thoracic and lumbar spine no step-off or deformities noted.  No pelvis instability no leg shortening he was able to ambulate.  Patient is moving all 4 extremities.  Neurovascular fully intact.  Skin:    General: Skin is warm and dry.     Comments: Patient's small abrasions on his back  Neurological:     Mental Status: He is alert.     Comments: No facial asymmetry, no difficultity with word finding, follow  two-step commands, no unilateral weakness present ambulate without difficulty.  Psychiatric:        Mood and Affect: Mood normal.    ED Results / Procedures / Treatments   Labs (all labs ordered are listed, but only abnormal results are displayed) Labs Reviewed  I-STAT CHEM 8, ED - Abnormal; Notable for the following components:      Result Value   BUN 25 (*)    Glucose, Bld 113 (*)    TCO2 33 (*)    All other components within normal limits     EKG None  Radiology CT Head Wo Contrast  Result Date: 04/16/2021 CLINICAL DATA:  Head trauma, minor (Age >= 65y).  Status post fall. EXAM: CT HEAD WITHOUT CONTRAST CT CERVICAL SPINE WITHOUT CONTRAST TECHNIQUE: Multidetector CT imaging of the head and cervical spine was performed following the standard protocol without intravenous contrast. Multiplanar CT image reconstructions of the cervical spine were also generated. RADIATION DOSE REDUCTION: This exam was performed according to  the departmental dose-optimization program which includes automated exposure control, adjustment of the mA and/or kV according to patient size and/or use of iterative reconstruction technique. COMPARISON:  None. FINDINGS: CT HEAD FINDINGS Brain: No evidence of large-territorial acute infarction. No parenchymal hemorrhage. No mass lesion. No extra-axial collection. No mass effect or midline shift. No hydrocephalus. Basilar cisterns are patent. Vascular: No hyperdense vessel. Skull: No acute fracture or focal lesion. Sinuses/Orbits: Paranasal sinuses and mastoid air cells are clear. The orbits are unremarkable. Other: None. CT CERVICAL SPINE FINDINGS Alignment: Normal. Skull base and vertebrae: Anterior C3-C4 cervical discectomy and interbody fusion. No CT findings suggest surgical hardware complication. Multilevel severe degenerative changes of the spine most prominent at the C5 through T1 levels with partial fusion of the C6 through T1 vertebral bodies. Severe osseous bilateral C3-C4 neural foraminal stenosis. Severe osseous left C5-C6 neural foraminal stenosis. Severe osseous bilateral C6-C7 neural foraminal stenosis. No severe osseous central canal stenosis. No acute fracture. No aggressive appearing focal osseous lesion or focal pathologic process. Soft tissues and spinal canal: No prevertebral fluid or swelling. No visible canal hematoma. Upper chest: Unremarkable. Other: Atherosclerotic plaque of the carotid arteries within  the neck. IMPRESSION: 1. No acute intracranial abnormality. 2. No acute displaced fracture or traumatic listhesis of the cervical spine. Electronically Signed   By: Iven Finn M.D.   On: 04/16/2021 15:45   CT CERVICAL SPINE WO CONTRAST  Result Date: 04/16/2021 CLINICAL DATA:  Head trauma, minor (Age >= 65y).  Status post fall. EXAM: CT HEAD WITHOUT CONTRAST CT CERVICAL SPINE WITHOUT CONTRAST TECHNIQUE: Multidetector CT imaging of the head and cervical spine was performed following the standard protocol without intravenous contrast. Multiplanar CT image reconstructions of the cervical spine were also generated. RADIATION DOSE REDUCTION: This exam was performed according to the departmental dose-optimization program which includes automated exposure control, adjustment of the mA and/or kV according to patient size and/or use of iterative reconstruction technique. COMPARISON:  None. FINDINGS: CT HEAD FINDINGS Brain: No evidence of large-territorial acute infarction. No parenchymal hemorrhage. No mass lesion. No extra-axial collection. No mass effect or midline shift. No hydrocephalus. Basilar cisterns are patent. Vascular: No hyperdense vessel. Skull: No acute fracture or focal lesion. Sinuses/Orbits: Paranasal sinuses and mastoid air cells are clear. The orbits are unremarkable. Other: None. CT CERVICAL SPINE FINDINGS Alignment: Normal. Skull base and vertebrae: Anterior C3-C4 cervical discectomy and interbody fusion. No CT findings suggest surgical hardware complication. Multilevel severe degenerative changes of the spine most prominent at the C5 through T1 levels with partial fusion of the C6 through T1 vertebral bodies. Severe osseous bilateral C3-C4 neural foraminal stenosis. Severe osseous left C5-C6 neural foraminal stenosis. Severe osseous bilateral C6-C7 neural foraminal stenosis. No severe osseous central canal stenosis. No acute fracture. No aggressive appearing focal osseous lesion or focal  pathologic process. Soft tissues and spinal canal: No prevertebral fluid or swelling. No visible canal hematoma. Upper chest: Unremarkable. Other: Atherosclerotic plaque of the carotid arteries within the neck. IMPRESSION: 1. No acute intracranial abnormality. 2. No acute displaced fracture or traumatic listhesis of the cervical spine. Electronically Signed   By: Iven Finn M.D.   On: 04/16/2021 15:45   CT CHEST ABDOMEN PELVIS W CONTRAST  Result Date: 04/16/2021 CLINICAL DATA:  Polytrauma, blunt.  Fall EXAM: CT CHEST, ABDOMEN, AND PELVIS WITH CONTRAST TECHNIQUE: Multidetector CT imaging of the chest, abdomen and pelvis was performed following the standard protocol during bolus administration of intravenous contrast. RADIATION DOSE REDUCTION: This exam was performed according  to the departmental dose-optimization program which includes automated exposure control, adjustment of the mA and/or kV according to patient size and/or use of iterative reconstruction technique. CONTRAST:  130mL OMNIPAQUE IOHEXOL 300 MG/ML  SOLN COMPARISON:  CT angiography chest 11/10/2019 FINDINGS: CHEST: Ports and Devices: None. Lungs/airways: No focal consolidation. Stable right upper lobe calcified pulmonary micronodule. Stable calcified right lower lobe subpleural micronodule. No pulmonary mass. No pulmonary contusion or laceration. No pneumatocele formation. The central airways are patent. Pleura: No pleural effusion. No pneumothorax. No hemothorax. Lymph Nodes: No mediastinal, hilar, or axillary lymphadenopathy. Mediastinum: No pneumomediastinum. No aortic injury or mediastinal hematoma. Stable 4 cm ascending thoracic aorta aneurysm (slightly limited evaluation due to motion artifact). The descending thoracic aorta is normal in caliber. Mild atherosclerotic plaque. At least 3 vessel coronary calcification. The heart is normal in size. No significant pericardial effusion. The main pulmonary artery is normal in caliber. No central  or proximal segmental pulmonary embolus. The esophagus is unremarkable. The thyroid is unremarkable. Chest Wall / Breasts: No chest wall mass.  Bilateral gynecomastia. Musculoskeletal: No acute rib or sternal fracture. No spinal fracture. ABDOMEN / PELVIS: Liver: Not enlarged. There is a 1.8 cm fluid density lesion within right hepatic lobe likely represents a simple hepatic cyst. Subcentimeter hypodensities are too small to characterize. No laceration or subcapsular hematoma. Biliary System: Status post appendectomy. No biliary ductal dilatation. Pancreas: Normal pancreatic contour. No main pancreatic duct dilatation. Spleen: Not enlarged. There is a 2.4 cm fluid dense lesion within the spleen likely represents a cyst. No laceration, subcapsular hematoma, or vascular injury. Adrenal Glands: No nodularity bilaterally. Kidneys: Bilateral kidneys enhance symmetrically. No hydronephrosis. No contusion, laceration, or subcapsular hematoma. No injury to the vascular structures or collecting systems. No hydroureter. The urinary bladder is unremarkable. Bowel: No small or large bowel wall thickening or dilatation. Scattered colonic diverticulosis. The appendix is unremarkable. Mesentery, Omentum, and Peritoneum: No simple free fluid ascites. No pneumoperitoneum. No hemoperitoneum. No mesenteric hematoma identified. No organized fluid collection. Pelvic Organs: The prostate is unremarkable. A penile prosthesis is noted. Query edema of the scrotum. Lymph Nodes: No abdominal, pelvic, inguinal lymphadenopathy. Vasculature: No abdominal aorta or iliac aneurysm. No active contrast extravasation or pseudoaneurysm. Musculoskeletal: There is a right medial gluteal subcutaneus soft tissue hematoma measuring up to at least 7.3 x 6 cm with associated surrounding subcutaneus soft tissue edema. Edema tracks along the perineum to the scrotum. No acute pelvic fracture. No spinal fracture. IMPRESSION: 1. No acute traumatic injury to the  chest, abdomen, or pelvis. 2. Please see separately dictated thoracolumbar spine CT 04/16/2021. 3. A 7 x 6 cm right medial gluteal subcutaneus soft tissue hematoma. 4. Query edema of the scrotum. Penile prosthesis grossly unremarkable. 5. Likely stable 4 cm ascending thoracic aorta aneurysm (slightly limited evaluation due to motion artifact). Recommend annual imaging followup by CTA or MRA. This recommendation follows 2010 ACCF/AHA/AATS/ACR/ASA/SCA/SCAI/SIR/STS/SVM Guidelines for the Diagnosis and Management of Patients with Thoracic Aortic Disease. Circulation. 2010; 121: G992-E268. Aortic aneurysm NOS (ICD10-I71.9) Electronically Signed   By: Iven Finn M.D.   On: 04/16/2021 15:57   CT T-SPINE NO CHARGE  Result Date: 04/16/2021 CLINICAL DATA:  Status post fall down 8 wooden steps. EXAM: CT THORACIC AND LUMBAR SPINE WITHOUT CONTRAST TECHNIQUE: Multidetector CT imaging of the thoracic and lumbar spine was performed without contrast. Multiplanar CT image reconstructions were also generated. RADIATION DOSE REDUCTION: This exam was performed according to the departmental dose-optimization program which includes automated exposure control, adjustment of the mA  and/or kV according to patient size and/or use of iterative reconstruction technique. COMPARISON:  None. FINDINGS: CT THORACIC SPINE FINDINGS Segmentation: 13 rib-bearing thoracic vertebral bodies labeled T1 through T13. Alignment: Normal. Vertebrae: Multilevel multi contiguous osteophyte formation. No acute fracture or focal pathologic process. Paraspinal and other soft tissues: Negative. Disc levels: Maintained. CT LUMBAR SPINE FINDINGS Segmentation: 4 non-rib-bearing lumbar vertebral bodies labeled L1 through L4. Alignment: Slight straightening of the normal lumbar lordosis likely due to positioning and degenerative changes. Vertebrae: Multilevel degenerative changes of the spine with endplate sclerosis at the L2-L3 levels as well as posterior disc  osteophyte complex formation at this level. No acute fracture or focal pathologic process. Paraspinal and other soft tissues: Negative. Disc levels: Multilevel osteophyte formation and intervertebral disc space narrowing with trace vacuum phenomenon at the L2-L3 level. IMPRESSION: CT THORACIC SPINE IMPRESSION No acute displaced fracture or traumatic listhesis of the thoracic spine. CT LUMBAR SPINE IMPRESSION No acute displaced fracture or traumatic listhesis of the lumbar spine. Electronically Signed   By: Iven Finn M.D.   On: 04/16/2021 16:04   CT L-SPINE NO CHARGE  Result Date: 04/16/2021 CLINICAL DATA:  Status post fall down 8 wooden steps. EXAM: CT THORACIC AND LUMBAR SPINE WITHOUT CONTRAST TECHNIQUE: Multidetector CT imaging of the thoracic and lumbar spine was performed without contrast. Multiplanar CT image reconstructions were also generated. RADIATION DOSE REDUCTION: This exam was performed according to the departmental dose-optimization program which includes automated exposure control, adjustment of the mA and/or kV according to patient size and/or use of iterative reconstruction technique. COMPARISON:  None. FINDINGS: CT THORACIC SPINE FINDINGS Segmentation: 13 rib-bearing thoracic vertebral bodies labeled T1 through T13. Alignment: Normal. Vertebrae: Multilevel multi contiguous osteophyte formation. No acute fracture or focal pathologic process. Paraspinal and other soft tissues: Negative. Disc levels: Maintained. CT LUMBAR SPINE FINDINGS Segmentation: 4 non-rib-bearing lumbar vertebral bodies labeled L1 through L4. Alignment: Slight straightening of the normal lumbar lordosis likely due to positioning and degenerative changes. Vertebrae: Multilevel degenerative changes of the spine with endplate sclerosis at the L2-L3 levels as well as posterior disc osteophyte complex formation at this level. No acute fracture or focal pathologic process. Paraspinal and other soft tissues: Negative. Disc  levels: Multilevel osteophyte formation and intervertebral disc space narrowing with trace vacuum phenomenon at the L2-L3 level. IMPRESSION: CT THORACIC SPINE IMPRESSION No acute displaced fracture or traumatic listhesis of the thoracic spine. CT LUMBAR SPINE IMPRESSION No acute displaced fracture or traumatic listhesis of the lumbar spine. Electronically Signed   By: Iven Finn M.D.   On: 04/16/2021 16:04    Procedures Procedures    Medications Ordered in ED Medications  HYDROmorphone (DILAUDID) injection 0.5 mg (has no administration in time range)  HYDROmorphone (DILAUDID) injection 1 mg (1 mg Intravenous Given 04/16/21 1407)  ondansetron (ZOFRAN) injection 4 mg (4 mg Intravenous Given 04/16/21 1409)  iohexol (OMNIPAQUE) 300 MG/ML solution 100 mL (100 mLs Intravenous Contrast Given 04/16/21 1537)    ED Course/ Medical Decision Making/ A&P                           Medical Decision Making Amount and/or Complexity of Data Reviewed Radiology: ordered.  Risk Prescription drug management.   This patient presents to the ED for concern of fall, this involves an extensive number of treatment options, and is a complaint that carries with it a high risk of complications and morbidity.  The differential diagnosis includes CVA, intracranial head bleed,  fractures, dislocation    Additional history obtained:  Additional history obtained wife who is at bedside   Co morbidities that complicate the patient evaluation  N/A  Social Determinants of Health:  N/A    Lab Tests:  I Ordered, and personally interpreted labs.  The pertinent results include: I-STAT which was unremarkable   Imaging Studies ordered:  I ordered imaging studies including CT chest abdomen pelvis, CT head, CT C-spine thoracic spine lumbar spine I independently visualized and interpreted imaging which showed CT head and C-spine both negative for acute findings, CT thoracic and lumbar spine also negative for  acute findings.  CT chest abdomen pelvis reveals ecchymosis noted within the gluteus aspect, 4 cm ascending thoracic aneurysm without acute changes I agree with the radiologist interpretation   Cardiac Monitoring:  The patient was maintained on a cardiac monitor.  I personally viewed and interpreted the cardiac monitored which showed an underlying rhythm of: N/A   Medicines ordered and prescription drug management:  I ordered medication including Dilaudid for pain management I have reviewed the patients home medicines and have made adjustments as needed  Reevaluation:  On evaluation patient had ecchymosis with a significant mechanism of injury I am concerned for possible intra-abdominal as well as intrathoracic trauma will obtain CT imaging for further evaluation will provide with pain medications.  Patient is reassessed updated on imaging, states he is feeling better, still has a pain will provide with additional pain medications, he also notes that he has not urinated since he is got here will obtain bladder scan and reassess  Bladder scan was obtained 325, patient medially urinated after an urinate about 350.  Rule out low suspicion for intracranial head bleed as patient denies loss of conscious, is not on anticoagulant, he does not endorse headaches, paresthesia/weakness in the upper and lower extremities, no focal deficits present on my exam, CT head negative for acute findings.  low suspicion for spinal cord abnormality or spinal fracture spine was palpated was nontender to palpation, patient has full range of motion in the upper and lower extremities, CT imaging of the cervical thoracic and lumbar spine negative for acute findings.  Low suspicion for intrathoracic or intra-abdominal trauma as CT imaging were negative for acute findings.  I have low suspicion for UTI or urinary retention he denies any dysuria hematuria, patient's urine without difficulty.   Dispostion and problem  list  After consideration of the diagnostic results and the patients response to treatment, I feel that the patent would benefit from discharge.  Pain-likely muscular in nature, will provide with pain medications, recommend over-the-counter pain medications, follow-up with PCP in weeks time symptoms not fully resolved.  given strict return precautions.            Final Clinical Impression(s) / ED Diagnoses Final diagnoses:  Fall, initial encounter    Rx / DC Orders ED Discharge Orders          Ordered    oxyCODONE-acetaminophen (PERCOCET/ROXICET) 5-325 MG tablet  Every 8 hours PRN        04/16/21 1707              Marcello Fennel, PA-C 04/16/21 1709    Varney Biles, MD 04/21/21 1427

## 2021-04-16 NOTE — ED Triage Notes (Signed)
Pt tripped over dog last night and fell down 8 wooden steps. Pt c/o pain to right buttock and upper leg, r and l mid back and right upper back. Denies LOC. Small Bruising noted to front left side of head. Denies n/v/dizziness. Abrasions noted to right and left mid back area. A/o. Ambulatory.

## 2021-04-16 NOTE — Discharge Instructions (Signed)
I suspect you are going to be pain over the next couple of days, have given you pain medication please take as prescribed.  You may also supplement with over-the-counter pain medications.  can try applying ice or heat over the area which can help with pain management.   I have given you a short course of narcotics please take as prescribed.  This medication can make you drowsy do not consume alcohol or operate heavy machinery when taking this medication.  This medication is Tylenol in it do not take Tylenol and take this medication.    Please follow-up PCP as needed  Come back to the emergency department if you develop chest pain, shortness of breath, severe abdominal pain, uncontrolled nausea, vomiting, diarrhea.

## 2021-04-30 ENCOUNTER — Other Ambulatory Visit: Payer: Self-pay | Admitting: Emergency Medicine

## 2021-04-30 ENCOUNTER — Other Ambulatory Visit (HOSPITAL_COMMUNITY): Payer: Self-pay | Admitting: Emergency Medicine

## 2021-04-30 DIAGNOSIS — S3992XD Unspecified injury of lower back, subsequent encounter: Secondary | ICD-10-CM

## 2021-04-30 DIAGNOSIS — N501 Vascular disorders of male genital organs: Secondary | ICD-10-CM

## 2021-04-30 DIAGNOSIS — T148XXA Other injury of unspecified body region, initial encounter: Secondary | ICD-10-CM

## 2021-04-30 DIAGNOSIS — Z87828 Personal history of other (healed) physical injury and trauma: Secondary | ICD-10-CM

## 2021-05-01 ENCOUNTER — Ambulatory Visit (HOSPITAL_COMMUNITY)
Admission: RE | Admit: 2021-05-01 | Discharge: 2021-05-01 | Disposition: A | Discharge status: Home / Self Care | Payer: Medicare Other | Source: Ambulatory Visit | Attending: Emergency Medicine | Admitting: Emergency Medicine

## 2021-05-01 ENCOUNTER — Other Ambulatory Visit: Payer: Self-pay

## 2021-05-01 ENCOUNTER — Encounter (HOSPITAL_COMMUNITY): Payer: Self-pay | Admitting: Radiology

## 2021-05-01 DIAGNOSIS — S3992XD Unspecified injury of lower back, subsequent encounter: Secondary | ICD-10-CM | POA: Diagnosis present

## 2021-05-01 MED ORDER — IOHEXOL 300 MG/ML  SOLN
100.0000 mL | Freq: Once | INTRAMUSCULAR | Status: AC | PRN
  Administered 2021-05-01: 100 mL via INTRAVENOUS

## 2021-06-11 ENCOUNTER — Ambulatory Visit (INDEPENDENT_AMBULATORY_CARE_PROVIDER_SITE_OTHER): Payer: Medicare Other

## 2021-06-11 ENCOUNTER — Encounter: Payer: Self-pay | Admitting: Orthopaedic Surgery

## 2021-06-11 ENCOUNTER — Ambulatory Visit (INDEPENDENT_AMBULATORY_CARE_PROVIDER_SITE_OTHER): Payer: Medicare Other | Admitting: Orthopaedic Surgery

## 2021-06-11 VITALS — BP 141/61 | HR 51 | Ht 73.0 in | Wt 230.0 lb

## 2021-06-11 DIAGNOSIS — G8929 Other chronic pain: Secondary | ICD-10-CM

## 2021-06-11 DIAGNOSIS — M25512 Pain in left shoulder: Secondary | ICD-10-CM | POA: Diagnosis not present
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
## 2021-06-12 ENCOUNTER — Ambulatory Visit (INDEPENDENT_AMBULATORY_CARE_PROVIDER_SITE_OTHER): Payer: Medicare Other | Admitting: Family Medicine

## 2021-06-12 ENCOUNTER — Ambulatory Visit (HOSPITAL_COMMUNITY)
Admission: RE | Admit: 2021-06-12 | Discharge: 2021-06-12 | Disposition: A | Discharge status: Home / Self Care | Payer: Medicare Other | Source: Ambulatory Visit | Attending: Orthopaedic Surgery | Admitting: Orthopaedic Surgery

## 2021-06-12 DIAGNOSIS — I7121 Aneurysm of the ascending aorta, without rupture: Secondary | ICD-10-CM

## 2021-06-12 DIAGNOSIS — M4712 Other spondylosis with myelopathy, cervical region: Secondary | ICD-10-CM

## 2021-06-12 DIAGNOSIS — Z8673 Personal history of transient ischemic attack (TIA), and cerebral infarction without residual deficits: Secondary | ICD-10-CM | POA: Diagnosis not present

## 2021-06-12 DIAGNOSIS — M25511 Pain in right shoulder: Secondary | ICD-10-CM | POA: Insufficient documentation

## 2021-06-12 DIAGNOSIS — E785 Hyperlipidemia, unspecified: Secondary | ICD-10-CM | POA: Diagnosis not present

## 2021-06-12 DIAGNOSIS — F419 Anxiety disorder, unspecified: Secondary | ICD-10-CM

## 2021-06-12 DIAGNOSIS — I10 Essential (primary) hypertension: Secondary | ICD-10-CM

## 2021-06-12 DIAGNOSIS — F332 Major depressive disorder, recurrent severe without psychotic features: Secondary | ICD-10-CM

## 2021-06-12 DIAGNOSIS — G8929 Other chronic pain: Secondary | ICD-10-CM | POA: Insufficient documentation

## 2021-06-12 MED ORDER — ASPIRIN 81 MG PO TBEC: 81.0000 mg | DELAYED_RELEASE_TABLET | Freq: Every day | ORAL | 3 refills | Status: DC

## 2021-06-12 NOTE — Patient Instructions (Signed)
Aspirin daily. ? ?Continue your other medications. ? ?Follow up in 6 months. ? ?Call with concerns. ? ?Take care ? ?Dr. Lacinda Axon  ?

## 2021-06-13 DIAGNOSIS — F419 Anxiety disorder, unspecified: Secondary | ICD-10-CM | POA: Insufficient documentation

## 2021-06-13 DIAGNOSIS — I714 Abdominal aortic aneurysm, without rupture, unspecified: Secondary | ICD-10-CM | POA: Insufficient documentation

## 2021-06-13 NOTE — Assessment & Plan Note (Signed)
Advised aspirin daily.  Discontinuing Plavix. ?

## 2021-06-13 NOTE — Assessment & Plan Note (Addendum)
Unsure of current status.  Needs labs from prior PCP.  Patient currently on Zetia and tolerating. ?

## 2021-06-13 NOTE — Assessment & Plan Note (Signed)
Followed by mental health provider at [facility]. ?

## 2021-06-13 NOTE — Assessment & Plan Note (Signed)
Blood pressure well controlled.  Continue chlorthalidone, metoprolol, and losartan.  ?

## 2021-06-13 NOTE — Progress Notes (Signed)
? ?Subjective:  ?Patient ID: [name], male    DOB: 1950/08/10  Age: 71 y.o. MRN: [id number] ? ?CC: ?Chief Complaint  ?Patient presents with  ? Establish Care  ?  Ell approx 6 to o8 weeks ago 8 steps down stairs in home , suffered hematoma and shoulder injury - being worked up for possible tear- stopped clopidogrel since - sees [facility] for psychology  ? ? ?HPI: ? ?71 year old male with history of thoracic aortic aneurysm, hypertension, alcohol abuse, severe depression and anxiety, history of stroke, hyperlipidemia presents to establish care. ? ?Patient's depression and anxiety are managed at [facility].  He is on multiple agents. ? ?Recently suffered an injury to the shoulder and is followed by orthopedics.  Scheduled for MRI today. ? ?Patient's hypertension is well controlled.  He is currently taking chlorthalidone 25 mg daily, losartan 100 mg, and metoprolol 25 mg every other day.  Has a history of PFO which has been closed. ? ?Patient's aneurysm is followed by CV surgery.  Has been stable. ? ?Need records regarding patient's most recent labs. ? ?Patient was previously taking Plavix following prior CVA.  However, he has recently stopped due to his recent injury.  He would like to discuss whether he should restart today. ? ?Patient Active Problem List  ? Diagnosis Date Noted  ? Anxiety 06/13/2021  ? History of stroke 06/12/2021  ? Hyperlipidemia 06/12/2021  ? Thoracic aortic aneurysm without rupture ([place]) 11/16/2018  ? Severe recurrent major depression without psychotic features ([place]) 06/20/2016  ? Alcohol abuse   ? Essential hypertension 06/07/2008  ? ? ?Social Hx   ?Social History  ? ?Socioeconomic History  ? Marital status: Married  ?  Spouse name: Not on file  ? Number of children: Not on file  ? Years of education: Not on file  ? Highest education level: Not on file  ?Occupational History  ? Not on file  ?Tobacco Use  ? Smoking status: Former  ?  Packs/day: 3.00  ?  Years: 35.00  ?  Pack years: 105.00   ?  Types: Cigarettes  ?  Quit date: 02/18/1992  ?  Years since quitting: 29.3  ? Smokeless tobacco: Never  ? Tobacco comments:  ?  no smoking in 24 yrs,   ?Vaping Use  ? Vaping Use: Never used  ?Substance and Sexual Activity  ? Alcohol use: Yes  ?  Alcohol/week: 4.0 standard drinks  ?  Types: 4 Shots of liquor per week  ?  Comment: No ETOH since 02/2016; previously 2-4 drinks vodka 2 times week  ? Drug use: No  ? Sexual activity: Not Currently  ?Other Topics Concern  ? Not on file  ?Social History Narrative  ? Not on file  ? ?Social Determinants of Health  ? ?Financial Resource Strain: Not on file  ?Food Insecurity: Not on file  ?Transportation Needs: Not on file  ?Physical Activity: Not on file  ?Stress: Not on file  ?Social Connections: Not on file  ? ? ?Review of Systems ?Per HPI ? ?Objective:  ?BP 118/64   Pulse (!) 56   Temp 98.1 ?F (36.7 ?C)   Ht '6\' 1"'$  (1.854 m)   Wt 233 lb (105.7 kg)   SpO2 95%   BMI 30.74 kg/m?  ? ? ?  06/12/2021  ?  1:07 PM 06/11/2021  ?  1:57 PM 04/16/2021  ?  3:12 PM  ?BP/Weight  ?Systolic BP 570 177 939  ?Diastolic BP 64 61 97  ?Wt. (  Lbs) 233 230   ?BMI 30.74 kg/m2 30.34 kg/m2   ? ? ?Physical Exam ?Vitals and nursing note reviewed.  ?Constitutional:   ?   General: He is not in acute distress. ?   Appearance: Normal appearance.  ?HENT:  ?   Head: Normocephalic and atraumatic.  ?Eyes:  ?   General:     ?   Right eye: No discharge.     ?   Left eye: No discharge.  ?   Conjunctiva/sclera: Conjunctivae normal.  ?Cardiovascular:  ?   Rate and Rhythm: Regular rhythm. Bradycardia present.  ?Pulmonary:  ?   Effort: Pulmonary effort is normal.  ?   Breath sounds: Normal breath sounds. No wheezing, rhonchi or rales.  ?Neurological:  ?   Mental Status: He is alert.  ? ? ?Lab Results  ?Component Value Date  ? WBC 7.6 12/19/2019  ? HGB 13.3 04/16/2021  ? HCT 39.0 04/16/2021  ? PLT 238 12/19/2019  ? GLUCOSE 113 (H) 04/16/2021  ? CHOL 233 (H) 12/18/2014  ? TRIG 202 (H) 12/18/2014  ? HDL 43 12/18/2014   ? [place] 150 (H) 12/18/2014  ? ALT 18 12/19/2019  ? AST 14 (L) 12/19/2019  ? NA 139 04/16/2021  ? K 3.9 04/16/2021  ? CL 100 04/16/2021  ? CREATININE 1.00 04/16/2021  ? BUN 25 (H) 04/16/2021  ? CO2 27 12/19/2019  ? TSH 1.518 06/12/2016  ? INR 1.10 03/01/2015  ? HGBA1C 5.8 (H) 12/18/2014  ? ? ? ?Assessment & Plan:  ? ?Problem List Items Addressed This Visit   ? ?  ? Cardiovascular and Mediastinum  ? Essential hypertension  ?  Blood pressure well controlled.  Continue chlorthalidone, metoprolol, and losartan.  ? ?  ?  ? Relevant Medications  ? aspirin 81 MG EC tablet  ? Thoracic aortic aneurysm without rupture ([place])  ? Relevant Medications  ? aspirin 81 MG EC tablet  ?  ? Other  ? Severe recurrent major depression without psychotic features ([place])  ?  Followed by mental health provider at [facility]. ? ?  ?  ? Relevant Medications  ? escitalopram (LEXAPRO) 20 MG tablet  ? History of stroke  ?  Advised aspirin daily.  Discontinuing Plavix. ? ?  ?  ? Hyperlipidemia  ?  Unsure of current status.  Needs labs from prior PCP.  Patient currently on Zetia and tolerating. ? ?  ?  ? Relevant Medications  ? aspirin 81 MG EC tablet  ? Anxiety  ? Relevant Medications  ? escitalopram (LEXAPRO) 20 MG tablet  ? ? ?Meds ordered this encounter  ?Medications  ? aspirin 81 MG EC tablet  ?  Sig: Take 1 tablet (81 mg total) by mouth daily. Swallow whole.  ?  Dispense:  90 tablet  ?  Refill:  3  ? ? ?Follow-up:  Return in about 6 months (around 12/12/2021) for Follow up Chronic medical issues. ? ?Thersa Salt DO ?[place] ? ?

## 2021-06-18 ENCOUNTER — Ambulatory Visit (INDEPENDENT_AMBULATORY_CARE_PROVIDER_SITE_OTHER): Payer: Medicare Other | Admitting: Orthopaedic Surgery

## 2021-06-18 ENCOUNTER — Encounter: Payer: Self-pay | Admitting: Orthopaedic Surgery

## 2021-06-18 DIAGNOSIS — G8929 Other chronic pain: Secondary | ICD-10-CM

## 2021-06-18 DIAGNOSIS — M25511 Pain in right shoulder: Secondary | ICD-10-CM

## 2021-06-18 NOTE — Progress Notes (Signed)
My shoulder is only a little better. ? ?He had injection last time which helped slightly. ? ?He had MRI which showed: ?IMPRESSION: ?1. Moderate tendinosis of the supraspinatus tendon with a tiny ?insertional interstitial tear. ?2. Mild tendinosis the infraspinatus tendon. ?3. Severe subacromial/subdeltoid bursitis. ?4. Partial-thickness cartilage loss of the glenohumeral joint. ? ?I have explained the findings to him.  I have recommended some exercises and use of a rubber tubing.  He appears to understand.  I will hold off on therapy now.   ? ?I have independently reviewed the MRI.   ? ? ?Increase Aleve to two bid pc ? ?Left shoulder has near full motion but pain in the extremes.  NV intact. ? ?Encounter Diagnosis  ?Name Primary?  ? Chronic right shoulder pain Yes  ? ?Return in six weeks. ? ?Do exercises. ? ?Call if any problem. ? ?Precautions discussed. ? ?Electronically Signed ?Sanjuana Kava, MD ?5/2/20232:19 PM ? ?

## 2021-07-30 ENCOUNTER — Ambulatory Visit (INDEPENDENT_AMBULATORY_CARE_PROVIDER_SITE_OTHER): Payer: Medicare Other | Admitting: Orthopaedic Surgery

## 2021-07-30 ENCOUNTER — Encounter: Payer: Self-pay | Admitting: Orthopaedic Surgery

## 2021-07-30 VITALS — BP 140/76 | HR 68 | Ht 73.0 in | Wt 230.0 lb

## 2021-07-30 DIAGNOSIS — M25512 Pain in left shoulder: Secondary | ICD-10-CM | POA: Diagnosis not present

## 2021-07-30 DIAGNOSIS — G8929 Other chronic pain: Secondary | ICD-10-CM

## 2021-07-30 NOTE — Progress Notes (Signed)
PROCEDURE NOTE:  The patient request injection, verbal consent was obtained.  The left shoulder was prepped appropriately after time out was performed.   Sterile technique was observed and injection of 1 cc of DepoMedrol '40mg'$  with several cc's of plain xylocaine. Anesthesia was provided by ethyl chloride and a 20-gauge needle was used to inject the shoulder area. A posterior approach was used.  The injection was tolerated well.  A band aid dressing was applied.  The patient was advised to apply ice later today and tomorrow to the injection sight as needed.  Encounter Diagnosis  Name Primary?   Chronic left shoulder pain Yes   Return in six weeks.  Call if any problem.  Precautions discussed.  Electronically Signed Sanjuana Kava, MD 6/13/20239:52 AM

## 2021-08-05 ENCOUNTER — Other Ambulatory Visit: Payer: Self-pay

## 2021-08-05 MED ORDER — LOSARTAN POTASSIUM 50 MG PO TABS: 100.0000 mg | ORAL_TABLET | Freq: Every day | ORAL | 2 refills | Status: DC

## 2021-08-12 DIAGNOSIS — R7303 Prediabetes: Secondary | ICD-10-CM | POA: Insufficient documentation

## 2021-08-12 DIAGNOSIS — R911 Solitary pulmonary nodule: Secondary | ICD-10-CM | POA: Insufficient documentation

## 2021-08-12 DIAGNOSIS — R51 Headache: Secondary | ICD-10-CM | POA: Insufficient documentation

## 2021-08-12 DIAGNOSIS — G894 Chronic pain syndrome: Secondary | ICD-10-CM | POA: Insufficient documentation

## 2021-08-12 DIAGNOSIS — G4730 Sleep apnea, unspecified: Secondary | ICD-10-CM | POA: Insufficient documentation

## 2021-08-12 DIAGNOSIS — R419 Unspecified symptoms and signs involving cognitive functions and awareness: Secondary | ICD-10-CM | POA: Insufficient documentation

## 2021-08-12 DIAGNOSIS — M109 Gout, unspecified: Secondary | ICD-10-CM | POA: Insufficient documentation

## 2021-08-12 DIAGNOSIS — R519 Headache, unspecified: Secondary | ICD-10-CM | POA: Insufficient documentation

## 2021-08-12 DIAGNOSIS — E042 Nontoxic multinodular goiter: Secondary | ICD-10-CM | POA: Insufficient documentation

## 2021-08-12 DIAGNOSIS — IMO0002 Reserved for concepts with insufficient information to code with codable children: Secondary | ICD-10-CM | POA: Insufficient documentation

## 2021-08-12 DIAGNOSIS — R2681 Unsteadiness on feet: Secondary | ICD-10-CM | POA: Insufficient documentation

## 2021-08-12 DIAGNOSIS — R399 Unspecified symptoms and signs involving the genitourinary system: Secondary | ICD-10-CM | POA: Insufficient documentation

## 2021-08-12 DIAGNOSIS — H40009 Preglaucoma, unspecified, unspecified eye: Secondary | ICD-10-CM | POA: Insufficient documentation

## 2021-08-12 DIAGNOSIS — R55 Syncope and collapse: Secondary | ICD-10-CM | POA: Insufficient documentation

## 2021-08-13 ENCOUNTER — Ambulatory Visit (INDEPENDENT_AMBULATORY_CARE_PROVIDER_SITE_OTHER): Payer: Medicare Other

## 2021-08-13 VITALS — Ht 73.0 in | Wt 230.0 lb

## 2021-08-13 DIAGNOSIS — Z0000 Encounter for general adult medical examination without abnormal findings: Secondary | ICD-10-CM | POA: Diagnosis not present
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
## 2021-08-19 ENCOUNTER — Ambulatory Visit (INDEPENDENT_AMBULATORY_CARE_PROVIDER_SITE_OTHER): Payer: Medicare Other | Admitting: Cardiovascular Disease

## 2021-08-19 ENCOUNTER — Encounter: Payer: Self-pay | Admitting: Cardiovascular Disease

## 2021-08-19 VITALS — BP 104/42 | HR 40 | Ht 73.0 in | Wt 235.8 lb

## 2021-08-19 DIAGNOSIS — I10 Essential (primary) hypertension: Secondary | ICD-10-CM

## 2021-08-19 DIAGNOSIS — I7121 Aneurysm of the ascending aorta, without rupture: Secondary | ICD-10-CM | POA: Diagnosis not present

## 2021-08-19 DIAGNOSIS — Q2112 Patent foramen ovale: Secondary | ICD-10-CM | POA: Diagnosis not present

## 2021-08-19 DIAGNOSIS — R001 Bradycardia, unspecified: Secondary | ICD-10-CM

## 2021-08-19 NOTE — Addendum Note (Signed)
Addended by: Levonne Hubert on: 08/19/2021 11:06 AM   Modules accepted: Orders

## 2021-08-19 NOTE — Patient Instructions (Signed)
Medication Instructions:  Your physician has recommended you make the following change in your medication:   Stop Taking Toprol XL   *If you need a refill on your cardiac medications before your next appointment, please call your pharmacy*   Lab Work: NONE   If you have labs (blood work) drawn today and your tests are completely normal, you will receive your results only by: [place] (if you have MyChart) OR A paper copy in the mail If you have any lab test that is abnormal or we need to change your treatment, we will call you to review the results.   Testing/Procedures: NONE    Follow-Up: At [facility], you and your health needs are our priority.  As part of our continuing mission to provide you with exceptional heart care, we have created designated Provider Care Teams.  These Care Teams include your primary Cardiologist (physician) and Advanced Practice Providers (APPs -  Physician Assistants and Nurse Practitioners) who all work together to provide you with the care you need, when you need it.  We recommend signing up for the patient portal called "MyChart".  Sign up information is provided on this After Visit Summary.  MyChart is used to connect with patients for Virtual Visits (Telemedicine).  Patients are able to view lab/test results, encounter notes, upcoming appointments, etc.  Non-urgent messages can be sent to your provider as well.   To learn more about what you can do with MyChart, go to [email or web address].    Your next appointment:   1 year(s)  The format for your next appointment:   In Person  Provider:   Jenkins Rouge, MD    Other Instructions Thank you for choosing [facility]!    Important Information About [place]

## 2021-09-17 ENCOUNTER — Ambulatory Visit: Payer: Medicare Other | Admitting: Orthopaedic Surgery

## 2021-10-24 ENCOUNTER — Encounter: Payer: Self-pay | Admitting: Thoracic Surgery (Cardiothoracic Vascular Surgery)

## 2021-10-24 ENCOUNTER — Other Ambulatory Visit: Payer: Self-pay | Admitting: Thoracic Surgery (Cardiothoracic Vascular Surgery)

## 2021-10-24 ENCOUNTER — Encounter: Payer: Self-pay | Admitting: Orthopedic Surgery

## 2021-10-24 DIAGNOSIS — I7121 Aneurysm of the ascending aorta, without rupture: Secondary | ICD-10-CM

## 2021-10-29 ENCOUNTER — Other Ambulatory Visit: Payer: Self-pay | Admitting: Family Medicine

## 2021-10-29 ENCOUNTER — Telehealth: Payer: Self-pay | Admitting: *Deleted

## 2021-10-29 DIAGNOSIS — I714 Abdominal aortic aneurysm, without rupture, unspecified: Secondary | ICD-10-CM

## 2021-10-29 NOTE — Telephone Encounter (Signed)
Good afternoon [name], sending you this just in case you need to call [facility] if there is a pre cert needed or anything. Please let us know if we need to do anything. Thank you  Pt is aware that we have ordered the CT scan

## 2021-10-29 NOTE — Telephone Encounter (Signed)
Patient stated he has a thoracic and abdominal aneurysm and he has a CT scan scheduled at [facility] 12/17/21 for his thoracic aneurysm by cardiothoracic specialist but would like [place] to schedule a CT can for the other aneurysm after that appt on the same day at [facility]. Please advise.

## 2021-10-29 NOTE — Telephone Encounter (Signed)
Coral Spikes, DO   Imaging ordered

## 2021-12-12 ENCOUNTER — Encounter: Payer: Self-pay | Admitting: Orthopaedic Surgery

## 2021-12-12 ENCOUNTER — Ambulatory Visit: Payer: Self-pay | Admitting: Family Medicine

## 2021-12-12 ENCOUNTER — Ambulatory Visit (INDEPENDENT_AMBULATORY_CARE_PROVIDER_SITE_OTHER): Payer: Medicare Other | Admitting: Orthopaedic Surgery

## 2021-12-12 VITALS — BP 171/113 | HR 65 | Ht 73.0 in | Wt 235.0 lb

## 2021-12-12 DIAGNOSIS — M25512 Pain in left shoulder: Secondary | ICD-10-CM | POA: Diagnosis not present

## 2021-12-12 DIAGNOSIS — G8929 Other chronic pain: Secondary | ICD-10-CM | POA: Diagnosis not present

## 2021-12-12 MED ORDER — METHYLPREDNISOLONE ACETATE 40 MG/ML IJ SUSP
40.0000 mg | Freq: Once | INTRAMUSCULAR | Status: AC
  Administered 2021-12-12: 40 mg via INTRA_ARTICULAR

## 2021-12-12 NOTE — Progress Notes (Signed)
PROCEDURE NOTE:  The patient request injection, verbal consent was obtained.  The left shoulder was prepped appropriately after time out was performed.   Sterile technique was observed and injection of 1 cc of DepoMedrol '40mg'$  with several cc's of plain xylocaine. Anesthesia was provided by ethyl chloride and a 20-gauge needle was used to inject the shoulder area. A posterior approach was used.  The injection was tolerated well.  A band aid dressing was applied.  The patient was advised to apply ice later today and tomorrow to the injection sight as needed.  Encounter Diagnosis  Name Primary?   Chronic left shoulder pain Yes   His wife is in end stage cancer treatments and is not responding.  He is the main caregiver.  Return prn.  Call if any problem.  Precautions discussed.  Electronically Signed Sanjuana Kava, MD 10/26/20239:20 AM

## 2021-12-12 NOTE — Patient Instructions (Signed)
As the weather changes and gets cooler, you may notice you are affected more. You may have more pain in your joints. This is normal. Dress warmly and make sure that area is covered well.

## 2021-12-13 ENCOUNTER — Ambulatory Visit: Payer: Medicare Other | Admitting: Orthopedic Surgery

## 2021-12-16 ENCOUNTER — Ambulatory Visit: Payer: Medicare Other | Admitting: Family Medicine

## 2021-12-17 ENCOUNTER — Ambulatory Visit (INDEPENDENT_AMBULATORY_CARE_PROVIDER_SITE_OTHER): Payer: Medicare Other | Admitting: Thoracic Surgery (Cardiothoracic Vascular Surgery)

## 2021-12-17 ENCOUNTER — Ambulatory Visit
Admission: RE | Admit: 2021-12-17 | Discharge: 2021-12-17 | Disposition: A | Discharge status: Home / Self Care | Payer: Medicare Other | Source: Ambulatory Visit | Attending: Thoracic Surgery (Cardiothoracic Vascular Surgery) | Admitting: Thoracic Surgery (Cardiothoracic Vascular Surgery)

## 2021-12-17 ENCOUNTER — Ambulatory Visit
Admission: RE | Admit: 2021-12-17 | Discharge: 2021-12-17 | Disposition: A | Discharge status: Home / Self Care | Payer: Medicare Other | Source: Ambulatory Visit | Attending: Family Medicine | Admitting: Family Medicine

## 2021-12-17 ENCOUNTER — Encounter: Payer: Self-pay | Admitting: Thoracic Surgery (Cardiothoracic Vascular Surgery)

## 2021-12-17 VITALS — BP 133/79 | HR 60 | Resp 18 | Ht 73.0 in | Wt 246.0 lb

## 2021-12-17 DIAGNOSIS — I7121 Aneurysm of the ascending aorta, without rupture: Secondary | ICD-10-CM | POA: Diagnosis not present

## 2021-12-17 MED ORDER — IOPAMIDOL (ISOVUE-370) INJECTION 76%
75.0000 mL | Freq: Once | INTRAVENOUS | Status: AC | PRN
  Administered 2021-12-17: 75 mL via INTRAVENOUS
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
## 2021-12-18 ENCOUNTER — Ambulatory Visit (INDEPENDENT_AMBULATORY_CARE_PROVIDER_SITE_OTHER): Payer: Medicare Other | Admitting: Family Medicine

## 2021-12-18 ENCOUNTER — Encounter: Payer: Self-pay | Admitting: Family Medicine

## 2021-12-18 VITALS — BP 138/70 | HR 65 | Wt 250.0 lb

## 2021-12-18 DIAGNOSIS — E785 Hyperlipidemia, unspecified: Secondary | ICD-10-CM

## 2021-12-18 DIAGNOSIS — I10 Essential (primary) hypertension: Secondary | ICD-10-CM

## 2021-12-18 DIAGNOSIS — Z23 Encounter for immunization: Secondary | ICD-10-CM

## 2021-12-18 DIAGNOSIS — I7121 Aneurysm of the ascending aorta, without rupture: Secondary | ICD-10-CM

## 2021-12-18 DIAGNOSIS — Z130 Encounter for screening for diseases of the blood and blood-forming organs and certain disorders involving the immune mechanism: Secondary | ICD-10-CM | POA: Diagnosis not present

## 2021-12-18 DIAGNOSIS — R7303 Prediabetes: Secondary | ICD-10-CM | POA: Diagnosis not present

## 2021-12-18 DIAGNOSIS — Z79899 Other long term (current) drug therapy: Secondary | ICD-10-CM

## 2021-12-18 DIAGNOSIS — F332 Major depressive disorder, recurrent severe without psychotic features: Secondary | ICD-10-CM

## 2021-12-18 NOTE — Patient Instructions (Signed)
Labs at your convenience.  Follow up closely with your Therapist.  Follow up in 6 months.  Take care  Dr. Lacinda Axon

## 2021-12-19 LAB — CMP14+EGFR
ALT: 67 IU/L — ABNORMAL HIGH (ref 0–44)
AST: 44 IU/L — ABNORMAL HIGH (ref 0–40)
Albumin/Globulin Ratio: 2.1 (ref 1.2–2.2)
Albumin: 4.9 g/dL — ABNORMAL HIGH (ref 3.8–4.8)
Alkaline Phosphatase: 83 IU/L (ref 44–121)
BUN/Creatinine Ratio: 27 — ABNORMAL HIGH (ref 10–24)
BUN: 25 mg/dL (ref 8–27)
Bilirubin Total: 0.9 mg/dL (ref 0.0–1.2)
CO2: 27 mmol/L (ref 20–29)
Calcium: 9.7 mg/dL (ref 8.6–10.2)
Chloride: 101 mmol/L (ref 96–106)
Creatinine, Ser: 0.92 mg/dL (ref 0.76–1.27)
Globulin, Total: 2.3 g/dL (ref 1.5–4.5)
Glucose: 124 mg/dL — ABNORMAL HIGH (ref 70–99)
Potassium: 4.6 mmol/L (ref 3.5–5.2)
Sodium: 143 mmol/L (ref 134–144)
Total Protein: 7.2 g/dL (ref 6.0–8.5)
eGFR: 88 mL/min/{1.73_m2} (ref >59)

## 2021-12-19 LAB — CBC
Hematocrit: 46.3 % (ref 37.5–51.0)
Hemoglobin: 16.2 g/dL (ref 13.0–17.7)
MCH: 34.1 pg — ABNORMAL HIGH (ref 26.6–33.0)
MCHC: 35 g/dL (ref 31.5–35.7)
MCV: 98 fL — ABNORMAL HIGH (ref 79–97)
Platelets: 193 10*3/uL (ref 150–450)
RBC: 4.75 x10E6/uL (ref 4.14–5.80)
RDW: 11.7 % (ref 11.6–15.4)
WBC: 6.7 10*3/uL (ref 3.4–10.8)

## 2021-12-19 LAB — LIPID PANEL
Chol/HDL Ratio: 2.4 ratio (ref 0.0–5.0)
Cholesterol, Total: 201 mg/dL — ABNORMAL HIGH (ref 100–199)
HDL: 85 mg/dL (ref >39)
LDL Chol Calc (NIH): 104 mg/dL — ABNORMAL HIGH (ref 0–99)
Triglycerides: 69 mg/dL (ref 0–149)
VLDL Cholesterol Cal: 12 mg/dL (ref 5–40)

## 2021-12-19 LAB — HEMOGLOBIN A1C
Est. average glucose Bld gHb Est-mCnc: 114 mg/dL
Hgb A1c MFr Bld: 5.6 % (ref 4.8–5.6)

## 2021-12-19 NOTE — Progress Notes (Signed)
Subjective:  Patient ID: [name], male    DOB: 15-Jan-1951  Age: 71 y.o. MRN: [id number]  CC: Chief Complaint  Patient presents with   Depression    Pt stressed but handling it. Pt has provider at [facility] that follows him and prescribed meds.     HPI:  71 year old male with thoracic aortic aneurysm, HTN, OSA, Depression, Chronic pain, HLD, Prediabetes, Anxiety presents for follow up.  Has had imaging for aneurysm on 10/31. Aneurysm is stable.   HTN is stable on Losartan and Chlorthalidone.   He is current struggling with anxiety and depression. This is due to his wife's current battle with cancer. Follows closely with psychiatry and his therapist.   Needs labs today. Desires flu shot today.  Patient Active Problem List   Diagnosis Date Noted   Chronic pain disorder 08/12/2021   Prediabetes 08/12/2021   Sleep apnea 08/12/2021   Multiple thyroid nodules 08/12/2021   Anxiety 06/13/2021   History of stroke 06/12/2021   Hyperlipidemia 06/12/2021   Thoracic aortic aneurysm without rupture ([place]) 11/16/2018   Severe recurrent major depression without psychotic features ([place]) 06/20/2016   Benzodiazepine abuse in remission ([place]) 03/28/2016   Alcohol abuse    Essential hypertension 06/07/2008    Social Hx   Social History   Socioeconomic History   Marital status: Married    Spouse name: Santiago Glad   Number of children: 3   Years of education: Not on file   Highest education level: Not on file  Occupational History   Not on file  Tobacco Use   Smoking status: Former    Packs/day: 3.00    Years: 35.00    Total pack years: 105.00    Types: Cigarettes    Quit date: 02/18/1992    Years since quitting: 29.8   Smokeless tobacco: Never   Tobacco comments:    no smoking in 24 yrs,   Vaping Use   Vaping Use: Never used  Substance and Sexual Activity   Alcohol use: Yes    Alcohol/week: 4.0 standard drinks of alcohol    Types: 4 Shots of liquor per week    Comment: No  ETOH since 02/2016; previously 2-4 drinks vodka 2 times week   Drug use: No   Sexual activity: Not Currently  Other Topics Concern   Not on file  Social History Narrative   3 biological children and 2 step children.   Social Determinants of Health   Financial Resource Strain: Low Risk  (08/13/2021)   Overall Financial Resource Strain (CARDIA)    Difficulty of Paying Living Expenses: Not hard at all  Food Insecurity: No Food Insecurity (08/13/2021)   Hunger Vital Sign    Worried About Running Out of Food in the Last Year: Never true    Ran Out of Food in the Last Year: Never true  Transportation Needs: No Transportation Needs (08/13/2021)   PRAPARE - Hydrologist (Medical): No    Lack of Transportation (Non-Medical): No  Physical Activity: Sufficiently Active (08/13/2021)   Exercise Vital Sign    Days of Exercise per Week: 5 days    Minutes of Exercise per Session: 30 min  Stress: Stress Concern Present (08/13/2021)   [place]    Feeling of Stress : To some extent  Social Connections: Moderately Integrated (08/13/2021)   Social Connection and Isolation Panel [NHANES]    Frequency of Communication with Friends and  Family: More than three times a week    Frequency of Social Gatherings with Friends and Family: More than three times a week    Attends Religious Services: 1 to 4 times per year    Active Member of [employer] or Organizations: No    Attends Music therapist: Never    Marital Status: Married    Review of Systems Per HPI  Objective:  BP 138/70   Pulse 65   Wt 250 lb (113.4 kg)   SpO2 95%   BMI 32.98 kg/m      12/18/2021   10:47 AM 12/17/2021   11:31 AM 12/12/2021    8:59 AM  BP/Weight  Systolic BP 419 379 024  Diastolic BP 70 79 097  Wt. (Lbs) 250 246 235  BMI 32.98 kg/m2 32.46 kg/m2 31 kg/m2    Physical Exam Vitals and nursing note reviewed.   Constitutional:      General: He is not in acute distress.    Appearance: Normal appearance.  HENT:     Head: Normocephalic and atraumatic.  Eyes:     General:        Right eye: No discharge.        Left eye: No discharge.     Conjunctiva/sclera: Conjunctivae normal.  Cardiovascular:     Rate and Rhythm: Normal rate and regular rhythm.  Pulmonary:     Effort: Pulmonary effort is normal.     Breath sounds: Normal breath sounds. No wheezing or rales.  Neurological:     Mental Status: He is alert.  Psychiatric:        Mood and Affect: Mood normal.        Behavior: Behavior normal.     Lab Results  Component Value Date   WBC 6.7 12/18/2021   HGB 16.2 12/18/2021   HCT 46.3 12/18/2021   PLT 193 12/18/2021   GLUCOSE 124 (H) 12/18/2021   CHOL 201 (H) 12/18/2021   TRIG 69 12/18/2021   HDL 85 12/18/2021   LDLCALC 104 (H) 12/18/2021   ALT 67 (H) 12/18/2021   AST 44 (H) 12/18/2021   NA 143 12/18/2021   K 4.6 12/18/2021   CL 101 12/18/2021   CREATININE 0.92 12/18/2021   BUN 25 12/18/2021   CO2 27 12/18/2021   TSH 1.518 06/12/2016   INR 1.10 03/01/2015   HGBA1C 5.6 12/18/2021     Assessment & Plan:   Problem List Items Addressed This Visit       Cardiovascular and Mediastinum   Thoracic aortic aneurysm without rupture ([place])    Stable.       Essential hypertension - Primary    BP Stable. Continue Losartan and Chlorthalidone.       Relevant Orders   CMP14+EGFR (Completed)     Other   Severe recurrent major depression without psychotic features ([place])    Advised close follow with therapist and psychiatry.       Relevant Medications   HYDROXYZINE HCL PO   Prediabetes   Relevant Orders   Hemoglobin A1c (Completed)   Hyperlipidemia   Relevant Orders   Lipid panel (Completed)   Other Visit Diagnoses     Need for vaccination       Relevant Orders   Flu Vaccine QUAD High Dose(Fluad) (Completed)   Screening for deficiency anemia       High risk medication  use       Relevant Orders   CBC (Completed)      Follow-up:  Return in about 6 months (around 06/18/2022).  [place]

## 2021-12-22 NOTE — Assessment & Plan Note (Signed)
Stable

## 2021-12-22 NOTE — Assessment & Plan Note (Signed)
BP Stable. Continue Losartan and Chlorthalidone.

## 2021-12-22 NOTE — Assessment & Plan Note (Signed)
Advised close follow with therapist and psychiatry.

## 2022-02-27 ENCOUNTER — Other Ambulatory Visit: Payer: Self-pay | Admitting: Family Medicine

## 2022-03-10 ENCOUNTER — Other Ambulatory Visit: Payer: Self-pay | Admitting: Family Medicine

## 2022-03-13 ENCOUNTER — Other Ambulatory Visit: Payer: Self-pay | Admitting: Family Medicine

## 2022-03-19 ENCOUNTER — Other Ambulatory Visit: Payer: Self-pay | Admitting: Family Medicine

## 2022-03-19 DIAGNOSIS — R1310 Dysphagia, unspecified: Secondary | ICD-10-CM

## 2022-04-17 ENCOUNTER — Encounter: Payer: Self-pay | Admitting: Radiology

## 2022-04-23 ENCOUNTER — Ambulatory Visit: Payer: Medicare Other | Admitting: Gastroenterology

## 2022-05-06 ENCOUNTER — Ambulatory Visit (INDEPENDENT_AMBULATORY_CARE_PROVIDER_SITE_OTHER): Payer: Medicare Other | Admitting: Internal Medicine

## 2022-05-06 ENCOUNTER — Ambulatory Visit: Payer: Medicare Other | Admitting: Internal Medicine

## 2022-05-06 ENCOUNTER — Other Ambulatory Visit: Payer: Self-pay | Admitting: *Deleted

## 2022-05-06 ENCOUNTER — Encounter: Payer: Self-pay | Admitting: Internal Medicine

## 2022-05-06 VITALS — BP 118/73 | HR 67 | Temp 97.9°F | Ht 74.0 in | Wt 240.0 lb

## 2022-05-06 DIAGNOSIS — R0989 Other specified symptoms and signs involving the circulatory and respiratory systems: Secondary | ICD-10-CM

## 2022-05-06 DIAGNOSIS — Z860100 Personal history of colon polyps, unspecified: Secondary | ICD-10-CM
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
## 2022-05-06 NOTE — Patient Instructions (Signed)
[name], it was great to see you again today!  As discussed, I am suspicious the reported Zenker's may be the cause of your throat clearing cough issues.  Will initially proceed with a barium pill esophagram to get a "roadmap"  Depending on the barium findings, will likely offer a diagnostic EGD.  If a Zenker's is confirmed to be the issue, you will be referred to the ear nose and throat specialist for definitive care  I would give some consideration of updating a colonoscopy at the same time as your EGD  Further recommendations to follow.

## 2022-05-07 ENCOUNTER — Encounter: Payer: Self-pay | Admitting: Internal Medicine

## 2022-05-13 ENCOUNTER — Ambulatory Visit (HOSPITAL_COMMUNITY)
Admission: RE | Admit: 2022-05-13 | Discharge: 2022-05-13 | Disposition: A | Discharge status: Home / Self Care | Payer: Medicare Other | Source: Ambulatory Visit | Attending: Internal Medicine | Admitting: Internal Medicine

## 2022-05-13 DIAGNOSIS — R0989 Other specified symptoms and signs involving the circulatory and respiratory systems: Secondary | ICD-10-CM | POA: Diagnosis not present

## 2022-06-12 ENCOUNTER — Telehealth: Payer: Self-pay | Admitting: *Deleted

## 2022-06-12 NOTE — Telephone Encounter (Signed)
Pt called to schedule his TCS/EGD ASA 3 w/Dr. Jena Gauss. He was given dates for procedure. He says he will have to call back to schedule because he will have to get someone to bring him to the procedure.

## 2022-06-16 ENCOUNTER — Other Ambulatory Visit: Payer: Self-pay | Admitting: *Deleted

## 2022-06-16 ENCOUNTER — Encounter: Payer: Self-pay | Admitting: *Deleted

## 2022-06-16 MED ORDER — NA SULFATE-K SULFATE-MG SULF 17.5-3.13-1.6 GM/177ML PO SOLN: ORAL | 0 refills | Status: DC

## 2022-06-16 NOTE — Telephone Encounter (Signed)
Pt has been scheduled for 08/15/22. Instructions mailed and prep sent to pharmacy.,

## 2022-06-17 ENCOUNTER — Encounter: Payer: Self-pay | Admitting: *Deleted

## 2022-06-18 ENCOUNTER — Ambulatory Visit: Payer: Medicare Other | Admitting: Family Medicine

## 2022-07-19 ENCOUNTER — Other Ambulatory Visit: Payer: Self-pay | Admitting: Family Medicine

## 2022-07-21 ENCOUNTER — Ambulatory Visit (INDEPENDENT_AMBULATORY_CARE_PROVIDER_SITE_OTHER): Payer: Medicare Other | Admitting: Family Medicine

## 2022-07-21 ENCOUNTER — Ambulatory Visit (HOSPITAL_COMMUNITY)
Admission: RE | Admit: 2022-07-21 | Discharge: 2022-07-21 | Disposition: A | Discharge status: Home / Self Care | Payer: Medicare Other | Source: Ambulatory Visit | Attending: Family Medicine | Admitting: Family Medicine

## 2022-07-21 VITALS — BP 120/72 | HR 63 | Temp 98.1°F | Ht 74.0 in | Wt 233.0 lb

## 2022-07-21 DIAGNOSIS — M546 Pain in thoracic spine: Secondary | ICD-10-CM | POA: Diagnosis not present

## 2022-07-21 DIAGNOSIS — D7589 Other specified diseases of blood and blood-forming organs: Secondary | ICD-10-CM

## 2022-07-21 DIAGNOSIS — R7303 Prediabetes: Secondary | ICD-10-CM | POA: Diagnosis not present

## 2022-07-21 DIAGNOSIS — I2510 Atherosclerotic heart disease of native coronary artery without angina pectoris: Secondary | ICD-10-CM | POA: Insufficient documentation

## 2022-07-21 DIAGNOSIS — M25561 Pain in right knee: Secondary | ICD-10-CM | POA: Diagnosis present

## 2022-07-21 DIAGNOSIS — I10 Essential (primary) hypertension: Secondary | ICD-10-CM | POA: Diagnosis not present

## 2022-07-21 DIAGNOSIS — E785 Hyperlipidemia, unspecified: Secondary | ICD-10-CM

## 2022-07-21 HISTORY — DX: Pain in right knee: M25.561

## 2022-07-21 MED ORDER — DICLOFENAC SODIUM 75 MG PO TBEC: 75.0000 mg | DELAYED_RELEASE_TABLET | Freq: Two times a day (BID) | ORAL | 0 refills | Status: DC | PRN

## 2022-07-21 NOTE — Patient Instructions (Signed)
Labs and xrays ordered.  Medication as prescribed.  We will call with results.  Take care  Dr. Adriana Simas

## 2022-07-21 NOTE — Assessment & Plan Note (Signed)
Hypertension is stable on chlorthalidone and losartan.

## 2022-07-21 NOTE — Assessment & Plan Note (Signed)
X-ray for further evaluation.  Concern for MCL injury.  Diclofenac as prescribed.  Continue wearing brace.

## 2022-07-21 NOTE — Progress Notes (Signed)
Subjective:  Patient ID: [name], male    DOB: [birth year]  Age: 72 y.o. MRN: [id number]  CC: Chief Complaint  Patient presents with   6 month follow up    Request for routine blood work    pain in upper back    Shoulder injured - scapula area hit under tractor 2 weeks ago - ibuprofen and aleve   Knee Injury    Right knee injured while pushing lawnmower 2 days ago     HPI:  72 year old male with the below mentioned medical problems presents for follow-up.  He has concerns today.  Patient reports that he recently hit his upper back while he was under a tractor.  He states that this is slowly improving.  He has been using over-the-counter NSAIDs.  Patient also reports that 2 days ago he was using a push mower and turned.  When he did so, injured his right knee.  He states that he felt a pop.  He reports pain on the medial aspect of his right knee.  He has been wearing a brace.  He has been using over-the-counter NSAIDs with little improvement.  He is concerned about ligamentous injury.  Patient is requesting his routine blood work.  Patient Active Problem List   Diagnosis Date Noted   CAD (coronary artery disease) 07/21/2022   Acute midline thoracic back pain 07/21/2022   Acute pain of right knee 07/21/2022   Chronic pain disorder 08/12/2021   Prediabetes 08/12/2021   Sleep apnea 08/12/2021   Multiple thyroid nodules 08/12/2021   Anxiety 06/13/2021   History of stroke 06/12/2021   Hyperlipidemia 06/12/2021   Thoracic aortic aneurysm without rupture (HCC) 11/16/2018   Severe recurrent major depression without psychotic features (HCC) 06/20/2016   Benzodiazepine abuse in remission (HCC) 03/28/2016   Alcohol abuse    Essential hypertension 06/07/2008    Social Hx   Social History   Socioeconomic History   Marital status: Married    Spouse name: Clydie Braun   Number of children: 3   Years of education: Not on file   Highest education level: Not on file   Occupational History   Not on file  Tobacco Use   Smoking status: Former    Packs/day: 3.00    Years: 35.00    Additional pack years: 0.00    Total pack years: 105.00    Types: Cigarettes    Quit date: 02/18/1992    Years since quitting: 30.4   Smokeless tobacco: Never   Tobacco comments:    no smoking in 24 yrs,   Vaping Use   Vaping Use: Never used  Substance and Sexual Activity   Alcohol use: Yes    Alcohol/week: 4.0 standard drinks of alcohol    Types: 4 Shots of liquor per week    Comment: No ETOH since 02/2016; previously 2-4 drinks vodka 2 times week   Drug use: No   Sexual activity: Not Currently  Other Topics Concern   Not on file  Social History Narrative   3 biological children and 2 step children.   Social Determinants of Health   Financial Resource Strain: Low Risk  (08/13/2021)   Overall Financial Resource Strain (CARDIA)    Difficulty of Paying Living Expenses: Not hard at all  Food Insecurity: No Food Insecurity (08/13/2021)   Hunger Vital Sign    Worried About Running Out of Food in the Last Year: Never true    Ran Out of Food in the Last Year:  Never true  Transportation Needs: No Transportation Needs (08/13/2021)   PRAPARE - Administrator, Civil Service (Medical): No    Lack of Transportation (Non-Medical): No  Physical Activity: Sufficiently Active (08/13/2021)   Exercise Vital Sign    Days of Exercise per Week: 5 days    Minutes of Exercise per Session: 30 min  Stress: Stress Concern Present (08/13/2021)   [employer] of Occupational Health - Occupational Stress Questionnaire    Feeling of Stress : To some extent  Social Connections: Moderately Integrated (08/13/2021)   Social Connection and Isolation Panel [NHANES]    Frequency of Communication with Friends and Family: More than three times a week    Frequency of Social Gatherings with Friends and Family: More than three times a week    Attends Religious Services: 1 to 4 times per  year    Active Member of [employer] or Organizations: No    Attends Engineer, structural: Never    Marital Status: Married    Review of Systems Per HPI  Objective:  BP 120/72   Pulse 63   Temp 98.1 F (36.7 C)   Ht 6\' 2"  (1.88 m)   Wt 233 lb (105.7 kg)   SpO2 98%   BMI 29.92 kg/m      07/21/2022    3:26 PM 05/06/2022    9:59 AM 12/18/2021   10:47 AM  BP/Weight  Systolic BP 120 118 138  Diastolic BP 72 73 70  Wt. (Lbs) 233 240 250  BMI 29.92 kg/m2 30.81 kg/m2 32.98 kg/m2    Physical Exam Constitutional:      General: He is not in acute distress.    Appearance: Normal appearance.  HENT:     Head: Normocephalic and atraumatic.  Cardiovascular:     Rate and Rhythm: Normal rate and regular rhythm.  Pulmonary:     Effort: Pulmonary effort is normal.     Breath sounds: Normal breath sounds.  Musculoskeletal:     Comments: Upper back with mild midline tenderness.  Right knee with tenderness to palpation over the MCL.  Neurological:     Mental Status: He is alert.  Psychiatric:        Mood and Affect: Mood normal.        Behavior: Behavior normal.     Lab Results  Component Value Date   WBC 6.7 12/18/2021   HGB 16.2 12/18/2021   HCT 46.3 12/18/2021   PLT 193 12/18/2021   GLUCOSE 124 (H) 12/18/2021   CHOL 201 (H) 12/18/2021   TRIG 69 12/18/2021   HDL 85 12/18/2021   LDLCALC 104 (H) 12/18/2021   ALT 67 (H) 12/18/2021   AST 44 (H) 12/18/2021   NA 143 12/18/2021   K 4.6 12/18/2021   CL 101 12/18/2021   CREATININE 0.92 12/18/2021   BUN 25 12/18/2021   CO2 27 12/18/2021   TSH 1.518 06/12/2016   INR 1.10 03/01/2015   HGBA1C 5.6 12/18/2021     Assessment & Plan:   Problem List Items Addressed This Visit       Cardiovascular and Mediastinum   Essential hypertension    Hypertension is stable on chlorthalidone and losartan.      Relevant Orders   CMP14+EGFR     Other   Hyperlipidemia   Relevant Orders   Lipid panel   Prediabetes   Relevant  Orders   Hemoglobin A1c   Acute pain of right knee    X-ray for further  evaluation.  Concern for MCL injury.  Diclofenac as prescribed.  Continue wearing brace.      Relevant Orders   DG Knee Complete 4 Views Right (Completed)   Acute midline thoracic back pain - Primary    X-ray for further evaluation today.      Relevant Medications   diclofenac (VOLTAREN) 75 MG EC tablet   Other Relevant Orders   DG Thoracic Spine W/Swimmers (Completed)   Other Visit Diagnoses     Macrocytosis       Relevant Orders   CBC   Vitamin B12   Folate       Meds ordered this encounter  Medications   diclofenac (VOLTAREN) 75 MG EC tablet    Sig: Take 1 tablet (75 mg total) by mouth 2 (two) times daily as needed for moderate pain.    Dispense:  30 tablet    Refill:  0    Follow-up:  Pending work up  [employer] DO [facility]

## 2022-07-21 NOTE — Assessment & Plan Note (Signed)
X-ray for further evaluation today. 

## 2022-07-24 LAB — HEMOGLOBIN A1C
Est. average glucose Bld gHb Est-mCnc: 120 mg/dL
Hgb A1c MFr Bld: 5.8 % — ABNORMAL HIGH (ref 4.8–5.6)

## 2022-07-24 LAB — CMP14+EGFR
ALT: 27 IU/L (ref 0–44)
AST: 23 IU/L (ref 0–40)
Albumin/Globulin Ratio: 2.4 — ABNORMAL HIGH (ref 1.2–2.2)
Albumin: 4.6 g/dL (ref 3.8–4.8)
Alkaline Phosphatase: 84 IU/L (ref 44–121)
BUN/Creatinine Ratio: 29 — ABNORMAL HIGH (ref 10–24)
BUN: 22 mg/dL (ref 8–27)
Bilirubin Total: 0.6 mg/dL (ref 0.0–1.2)
CO2: 27 mmol/L (ref 20–29)
Calcium: 9.7 mg/dL (ref 8.6–10.2)
Chloride: 101 mmol/L (ref 96–106)
Creatinine, Ser: 0.77 mg/dL (ref 0.76–1.27)
Globulin, Total: 1.9 g/dL (ref 1.5–4.5)
Glucose: 90 mg/dL (ref 70–99)
Potassium: 4.1 mmol/L (ref 3.5–5.2)
Sodium: 142 mmol/L (ref 134–144)
Total Protein: 6.5 g/dL (ref 6.0–8.5)
eGFR: 94 mL/min/{1.73_m2} (ref >59)

## 2022-07-24 LAB — VITAMIN B12: Vitamin B-12: 386 pg/mL (ref 232–1245)

## 2022-07-24 LAB — CBC
Hematocrit: 42.5 % (ref 37.5–51.0)
Hemoglobin: 15 g/dL (ref 13.0–17.7)
MCH: 33 pg (ref 26.6–33.0)
MCHC: 35.3 g/dL (ref 31.5–35.7)
MCV: 94 fL (ref 79–97)
Platelets: 231 10*3/uL (ref 150–450)
RBC: 4.54 x10E6/uL (ref 4.14–5.80)
RDW: 11.8 % (ref 11.6–15.4)
WBC: 7.2 10*3/uL (ref 3.4–10.8)

## 2022-07-24 LAB — LIPID PANEL
Chol/HDL Ratio: 2.9 ratio (ref 0.0–5.0)
Cholesterol, Total: 194 mg/dL (ref 100–199)
HDL: 68 mg/dL (ref >39)
LDL Chol Calc (NIH): 95 mg/dL (ref 0–99)
Triglycerides: 186 mg/dL — ABNORMAL HIGH (ref 0–149)
VLDL Cholesterol Cal: 31 mg/dL (ref 5–40)

## 2022-07-24 LAB — FOLATE: Folate: 9.8 ng/mL (ref >3.0)

## 2022-07-27 ENCOUNTER — Encounter: Payer: Self-pay | Admitting: Family Medicine

## 2022-07-29 ENCOUNTER — Encounter: Payer: Self-pay | Admitting: Orthopaedic Surgery

## 2022-07-29 ENCOUNTER — Ambulatory Visit (INDEPENDENT_AMBULATORY_CARE_PROVIDER_SITE_OTHER): Payer: Medicare Other | Admitting: Orthopaedic Surgery

## 2022-07-29 VITALS — BP 127/78 | HR 77 | Ht 74.0 in | Wt 230.0 lb

## 2022-07-29 DIAGNOSIS — M25561 Pain in right knee: Secondary | ICD-10-CM | POA: Diagnosis not present

## 2022-07-29 DIAGNOSIS — W19XXXA Unspecified fall, initial encounter: Secondary | ICD-10-CM | POA: Diagnosis not present

## 2022-07-29 MED ORDER — METHYLPREDNISOLONE ACETATE 40 MG/ML IJ SUSP
40.0000 mg | Freq: Once | INTRAMUSCULAR | Status: AC
  Administered 2022-07-29: 40 mg via INTRA_ARTICULAR

## 2022-07-29 NOTE — Progress Notes (Signed)
I fell and hurt my knee.  He fell about 10 days or so ago and hurt his right knee.  He has had anterior swelling and pain medially.  He has feeling of instability.  He had X-rays done 07-21-22.  No fracture was seen, no loose body.  He has some anterior swelling over the patella tendon.  He saw his family doctor, given diclofenac which only helped a little.  His pain continues medially, swelling and instability medially.  I have independently reviewed and interpreted x-rays of this patient done at another site by another physician or qualified health professional.  Right knee has swelling over patella tendon, no redness.  Medial joint pain. ROM 0 to 100 with pain medially, 1+ laxity medially over MCL, positive medial McMurray, Limp right, NV intact, no distal edema.  Encounter Diagnosis  Name Primary?   Acute pain of right knee Yes   I am concerned about medial meniscus tear.  I will get MRI.  Return in one week.  PROCEDURE NOTE:  The patient requests injections of the right knee , verbal consent was obtained.  The right knee was prepped appropriately after time out was performed.   Sterile technique was observed and injection of 1 cc of DepoMedrol 40mg  with several cc's of plain xylocaine. Anesthesia was provided by ethyl chloride and a 20-gauge needle was used to inject the knee area. The injection was tolerated well.  A band aid dressing was applied.  The patient was advised to apply ice later today and tomorrow to the injection sight as needed.  Call if any problem.  Precautions discussed.  Electronically Signed Darreld Mclean, MD 6/11/20242:38 PM

## 2022-07-30 ENCOUNTER — Telehealth: Payer: Self-pay | Admitting: Orthopaedic Surgery

## 2022-07-30 ENCOUNTER — Ambulatory Visit (HOSPITAL_COMMUNITY)
Admission: RE | Admit: 2022-07-30 | Discharge: 2022-07-30 | Disposition: A | Discharge status: Home / Self Care | Payer: Medicare Other | Source: Ambulatory Visit | Attending: Orthopaedic Surgery | Admitting: Orthopaedic Surgery

## 2022-07-30 DIAGNOSIS — M25561 Pain in right knee: Secondary | ICD-10-CM | POA: Diagnosis present

## 2022-07-30 DIAGNOSIS — G8929 Other chronic pain: Secondary | ICD-10-CM

## 2022-07-30 NOTE — Addendum Note (Signed)
Addended by: Michaele Offer on: 07/30/2022 10:42 AM   Modules accepted: Orders

## 2022-07-30 NOTE — Telephone Encounter (Signed)
KEELING Patient called and states he went to [facility] to get his MRI done and they said they do not have a order in the system, they have to have the order. They said they can get him in today if we put the order in.   Please call the patient back and advise him [phone]

## 2022-08-07 ENCOUNTER — Telehealth: Payer: Self-pay | Admitting: Radiology

## 2022-08-07 NOTE — Telephone Encounter (Signed)
Mychart message sent.

## 2022-08-08 ENCOUNTER — Encounter: Payer: Self-pay | Admitting: Orthopedic Surgery

## 2022-08-08 ENCOUNTER — Ambulatory Visit (INDEPENDENT_AMBULATORY_CARE_PROVIDER_SITE_OTHER): Payer: Medicare Other | Admitting: Orthopedic Surgery

## 2022-08-08 VITALS — BP 123/67 | HR 66 | Ht 74.0 in | Wt 227.8 lb

## 2022-08-08 DIAGNOSIS — S83231A Complex tear of medial meniscus, current injury, right knee, initial encounter: Secondary | ICD-10-CM

## 2022-08-08 DIAGNOSIS — M1711 Unilateral primary osteoarthritis, right knee: Secondary | ICD-10-CM

## 2022-08-08 MED ORDER — METHYLPREDNISOLONE ACETATE 40 MG/ML IJ SUSP
40.0000 mg | Freq: Once | INTRAMUSCULAR | Status: AC
  Administered 2022-08-08: 40 mg via INTRA_ARTICULAR

## 2022-08-08 NOTE — Patient Instructions (Addendum)
Call when you are ready for the surgery, leave message for [facility] [employer] or send a my chart message, pick a Tuesday or Wednesday.  Your surgery will be at [facility] by Dr Romeo Apple  The hospital will contact you with a preoperative appointment to discuss Anesthesia.  Please arrive on time or 15 minutes early for the preoperative appointment, they have a very tight schedule if you are late or do not come in your surgery will be cancelled.  The phone number is [phone]. Please bring your medications with you for the appointment. They will tell you the arrival time and medication instructions when you have your preoperative evaluation. Do not wear nail polish the day of your surgery and if you take Phentermine you need to stop this medication ONE WEEK prior to your surgery. If you take Docia Barrier, Jardiance, or Steglatro) - Hold 72 hours before the procedure.  If you take Ozempic,  Bydureon or Trulicity do not take for 8 days before your surgery. If you take Victoza, Rybelsis, Saxenda or Adlyxi stop 24 hours before the procedure.  Please arrive at the hospital 2 hours before procedure if scheduled at 9:30 or later in the day or at the time the nurse tells you at your preoperative visit.   If you have my chart do not use the time given in my chart use the time given to you by the nurse during your preoperative visit.   Your surgery  time may change. Please be available for phone calls the day of your surgery and the day before. The Short Stay department may need to discuss changes about your surgery time. Not reaching the you could lead to procedure delays and possible cancellation.  You must have a ride home and someone to stay with you for 24 to 48 hours. The person taking you home will receive and sign for the your discharge instructions.  Please be prepared to give your support person's name and telephone number to Central Registration. Dr Romeo Apple will need that name and phone number post  procedure.

## 2022-08-11 ENCOUNTER — Encounter: Payer: Self-pay | Admitting: Orthopedic Surgery

## 2022-08-11 NOTE — Addendum Note (Signed)
Addended by: Vickki Hearing on: 08/11/2022 04:32 PM   Modules accepted: Level of Service

## 2022-08-11 NOTE — Progress Notes (Addendum)
Chief Complaint  Patient presents with   Knee Pain    R knee pain here to discuss options for knee at this point.     72 year old male referred to me by Dr. Hilda Lias with right knee pain after trauma.  His MRI shows arthritis and torn meniscus  I discussed this with him in terms of how much pain relief he would get from surgery and what type of residual symptoms he would have from arthritis.  He seemed to understand that and wishes to proceed with arthroscopy once he talks with his family.  Problem list, medical hx, medications and allergies reviewed   BP 123/67   Pulse 66   Ht 6\' 2"  (1.88 m)   Wt 227 lb 12.8 oz (103.3 kg)   BMI 29.25 kg/m  Physical Exam Vitals and nursing note reviewed.  Constitutional:      Appearance: Normal appearance.  HENT:     Head: Normocephalic and atraumatic.  Eyes:     General: No scleral icterus.       Right eye: No discharge.        Left eye: No discharge.     Extraocular Movements: Extraocular movements intact.     Conjunctiva/sclera: Conjunctivae normal.     Pupils: Pupils are equal, round, and reactive to light.  Cardiovascular:     Rate and Rhythm: Normal rate.     Pulses: Normal pulses.  Musculoskeletal:     Comments: Skin envelope is normal over the right knee  He is tender over the medial joint line.  There is mild crepitation and mild effusion.  Range of motion remains intact.  No instability is detected muscle tone is normal meniscal signs such as McMurray's is positive  Skin:    General: Skin is warm and dry.     Capillary Refill: Capillary refill takes less than 2 seconds.  Neurological:     General: No focal deficit present.     Mental Status: He is alert and oriented to person, place, and time.     Gait: Gait abnormal.  Psychiatric:        Mood and Affect: Mood normal.        Behavior: Behavior normal.        Thought Content: Thought content normal.        Judgment: Judgment normal.   Encounter Diagnoses  Name Primary?    Complex tear of medial meniscus of right knee as current injury, initial encounter Yes   Unilateral primary osteoarthritis, right knee     Imaging studies do not show a significant amount of arthritis probably a grade 1 or 2 by the KL criteria  His MRI however shows high-grade cartilage loss medial and patellofemoral compartments with a complex medial meniscus tear  We decided to do an injection for now   Procedure note right knee injection   verbal consent was obtained to inject right knee joint  Timeout was completed to confirm the site of injection  The medications used were depomedrol 40 mg and 1% lidocaine 3 cc Anesthesia was provided by ethyl chloride and the skin was prepped with alcohol.  After cleaning the skin with alcohol a 20-gauge needle was used to inject the right knee joint. There were no complications. A sterile bandage was applied.   Encounter Diagnoses  Name Primary?   Complex tear of medial meniscus of right knee as current injury, initial encounter Yes   Unilateral primary osteoarthritis, right knee     Plan arthroscopy right  knee partial medial meniscectomy Expect brace for several weeks after surgery  The procedure has been fully reviewed with the patient; The risks and benefits of surgery have been discussed and explained and understood. Alternative treatment has also been reviewed, questions were encouraged and answered. The postoperative plan is also been reviewed.

## 2022-08-13 ENCOUNTER — Encounter (HOSPITAL_COMMUNITY)
Admission: RE | Admit: 2022-08-13 | Discharge: 2022-08-13 | Disposition: A | Discharge status: Home / Self Care | Payer: Medicare Other | Source: Ambulatory Visit | Attending: Internal Medicine | Admitting: Internal Medicine
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
## 2022-08-13 NOTE — Patient Instructions (Signed)
    [name]  08/13/2022     @PREFPERIOPPHARMACY @   Your procedure is scheduled on Friday, June 28.  [facility] to [facility] at 0830 [facility].M.  Call this number if you have problems the morning of surgery:  [phone]  If you experience any cold or flu symptoms such as cough, fever, chills, shortness of breath, etc. between now and your scheduled surgery, please notify us at the above number.   Remember:  Do not eat or drink after midnight.                      Take these medicines the morning of surgery with A SIP OF WATER lexapro, lamictal, and zetia    Do not wear jewelry, make-up or nail polish, including gel polish,  artificial nails, or any other type of covering on natural nails (fingers and  toes).  Do not wear lotions, powders, or perfumes, or deodorant.  Do not shave 48 hours prior to surgery.  Men may shave face and neck.  Do not bring valuables to the hospital.  [facility] is not responsible for any belongings or valuables.  Contacts, dentures or bridgework may not be worn into surgery.  Leave your suitcase in the car.  After surgery it may be brought to your room.  For patients admitted to the hospital, discharge time will be determined by your treatment team.  Patients discharged the day of surgery will not be allowed to drive home.   Name and phone number of your driver:   daughter, [name] Special instructions:  Please follow diet and prep instructions given to you by Dr Jena Gauss.  Please read over the following fact sheets that you were given. Anesthesia Post-op Instructions and Care and Recovery After Surgery

## 2022-08-15 ENCOUNTER — Encounter (HOSPITAL_COMMUNITY): Payer: Self-pay | Admitting: Internal Medicine

## 2022-08-15 ENCOUNTER — Ambulatory Visit (HOSPITAL_COMMUNITY): Payer: Medicare Other | Admitting: Certified Registered"

## 2022-08-15 ENCOUNTER — Telehealth: Payer: Self-pay

## 2022-08-15 ENCOUNTER — Other Ambulatory Visit: Payer: Self-pay

## 2022-08-15 ENCOUNTER — Ambulatory Visit (HOSPITAL_BASED_OUTPATIENT_CLINIC_OR_DEPARTMENT_OTHER): Payer: Medicare Other | Admitting: Certified Registered"

## 2022-08-15 ENCOUNTER — Encounter (HOSPITAL_COMMUNITY)
Admission: RE | Disposition: A | Discharge status: Home / Self Care | Payer: Self-pay | Source: Ambulatory Visit | Attending: Internal Medicine

## 2022-08-15 ENCOUNTER — Ambulatory Visit (HOSPITAL_COMMUNITY)
Admission: RE | Admit: 2022-08-15 | Discharge: 2022-08-15 | Disposition: A | Discharge status: Home / Self Care | Payer: Medicare Other | Source: Ambulatory Visit | Attending: Internal Medicine | Admitting: Internal Medicine

## 2022-08-15 DIAGNOSIS — Z1211 Encounter for screening for malignant neoplasm of colon: Secondary | ICD-10-CM | POA: Diagnosis present

## 2022-08-15 DIAGNOSIS — K259 Gastric ulcer, unspecified as acute or chronic, without hemorrhage or perforation: Secondary | ICD-10-CM | POA: Diagnosis not present

## 2022-08-15 DIAGNOSIS — R1310 Dysphagia, unspecified: Secondary | ICD-10-CM | POA: Insufficient documentation

## 2022-08-15 DIAGNOSIS — D126 Benign neoplasm of colon, unspecified: Secondary | ICD-10-CM

## 2022-08-15 DIAGNOSIS — I2510 Atherosclerotic heart disease of native coronary artery without angina pectoris: Secondary | ICD-10-CM

## 2022-08-15 DIAGNOSIS — K529 Noninfective gastroenteritis and colitis, unspecified: Secondary | ICD-10-CM | POA: Insufficient documentation

## 2022-08-15 DIAGNOSIS — Z86010 Personal history of colonic polyps: Secondary | ICD-10-CM | POA: Insufficient documentation

## 2022-08-15 DIAGNOSIS — D124 Benign neoplasm of descending colon: Secondary | ICD-10-CM | POA: Insufficient documentation

## 2022-08-15 DIAGNOSIS — K5730 Diverticulosis of large intestine without perforation or abscess without bleeding: Secondary | ICD-10-CM | POA: Insufficient documentation

## 2022-08-15 DIAGNOSIS — I10 Essential (primary) hypertension: Secondary | ICD-10-CM | POA: Diagnosis not present

## 2022-08-15 DIAGNOSIS — Z87891 Personal history of nicotine dependence: Secondary | ICD-10-CM | POA: Insufficient documentation

## 2022-08-15 HISTORY — PX: COLONOSCOPY WITH PROPOFOL: SHX5780

## 2022-08-15 HISTORY — PX: ESOPHAGOGASTRODUODENOSCOPY (EGD) WITH PROPOFOL: SHX5813

## 2022-08-15 HISTORY — PX: BIOPSY: SHX5522

## 2022-08-15 SURGERY — COLONOSCOPY WITH PROPOFOL
Anesthesia: General

## 2022-08-15 MED ORDER — PROPOFOL 10 MG/ML IV BOLUS
INTRAVENOUS | Status: DC | PRN
  Administered 2022-08-15: 100 mg via INTRAVENOUS
  Administered 2022-08-15: 40 mg via INTRAVENOUS

## 2022-08-15 MED ORDER — PHENYLEPHRINE 80 MCG/ML (10ML) SYRINGE FOR IV PUSH (FOR BLOOD PRESSURE SUPPORT)
PREFILLED_SYRINGE | INTRAVENOUS | Status: DC | PRN
  Administered 2022-08-15: 160 ug via INTRAVENOUS
  Administered 2022-08-15: 80 ug via INTRAVENOUS

## 2022-08-15 MED ORDER — LACTATED RINGERS IV SOLN: INTRAVENOUS | Status: DC

## 2022-08-15 MED ORDER — GLYCOPYRROLATE PF 0.2 MG/ML IJ SOSY
PREFILLED_SYRINGE | INTRAMUSCULAR | Status: AC
  Filled 2022-08-15: qty 1

## 2022-08-15 MED ORDER — PHENYLEPHRINE 80 MCG/ML (10ML) SYRINGE FOR IV PUSH (FOR BLOOD PRESSURE SUPPORT)
PREFILLED_SYRINGE | INTRAVENOUS | Status: AC
  Filled 2022-08-15: qty 10

## 2022-08-15 MED ORDER — PROPOFOL 500 MG/50ML IV EMUL
INTRAVENOUS | Status: AC
  Filled 2022-08-15: qty 50

## 2022-08-15 MED ORDER — PROPOFOL 500 MG/50ML IV EMUL
INTRAVENOUS | Status: DC | PRN
  Administered 2022-08-15: 150 ug/kg/min via INTRAVENOUS

## 2022-08-15 MED ORDER — EPHEDRINE 5 MG/ML INJ
INTRAVENOUS | Status: AC
  Filled 2022-08-15: qty 5

## 2022-08-15 MED ORDER — LIDOCAINE HCL (CARDIAC) PF 100 MG/5ML IV SOSY
PREFILLED_SYRINGE | INTRAVENOUS | Status: DC | PRN
  Administered 2022-08-15: 80 mg via INTRAVENOUS

## 2022-08-15 MED ORDER — EPHEDRINE SULFATE-NACL 50-0.9 MG/10ML-% IV SOSY
PREFILLED_SYRINGE | INTRAVENOUS | Status: DC | PRN
  Administered 2022-08-15: 5 mg via INTRAVENOUS
  Administered 2022-08-15: 10 mg via INTRAVENOUS

## 2022-08-15 MED ORDER — PANTOPRAZOLE SODIUM 40 MG PO TBEC: 40.0000 mg | DELAYED_RELEASE_TABLET | Freq: Two times a day (BID) | ORAL | 3 refills | Status: DC

## 2022-08-15 NOTE — Anesthesia Preprocedure Evaluation (Signed)
Anesthesia Evaluation  Patient identified by MRN, date of birth, ID band Patient awake    Reviewed: Allergy & Precautions, H&P , NPO status , Patient's Chart, lab work & pertinent test results, reviewed documented beta blocker date and time   Airway Mallampati: II  TM Distance: >3 FB Neck ROM: full    Dental no notable dental hx.    Pulmonary neg pulmonary ROS, shortness of breath, sleep apnea , pneumonia, COPD, former smoker   Pulmonary exam normal breath sounds clear to auscultation       Cardiovascular Exercise Tolerance: Good hypertension, + CAD  negative cardio ROS  Rhythm:regular Rate:Normal     Neuro/Psych  Headaches PSYCHIATRIC DISORDERS Anxiety Depression     Neuromuscular disease CVA negative neurological ROS  negative psych ROS   GI/Hepatic negative GI ROS, Neg liver ROS,,,  Endo/Other  negative endocrine ROS    Renal/GU negative Renal ROS  negative genitourinary   Musculoskeletal   Abdominal   Peds  Hematology negative hematology ROS (+)   Anesthesia Other Findings   Reproductive/Obstetrics negative OB ROS                             Anesthesia Physical Anesthesia Plan  ASA: 3  Anesthesia Plan: General   Post-op Pain Management:    Induction:   PONV Risk Score and Plan: Propofol infusion  Airway Management Planned:   Additional Equipment:   Intra-op Plan:   Post-operative Plan:   Informed Consent: I have reviewed the patients History and Physical, chart, labs and discussed the procedure including the risks, benefits and alternatives for the proposed anesthesia with the patient or authorized representative who has indicated his/her understanding and acceptance.     Dental Advisory Given  Plan Discussed with: CRNA  Anesthesia Plan Comments:        Anesthesia Quick Evaluation

## 2022-08-15 NOTE — Transfer of Care (Addendum)
Immediate Anesthesia Transfer of Care Note  Patient: [name]  Procedure(s) Performed: COLONOSCOPY WITH PROPOFOL ESOPHAGOGASTRODUODENOSCOPY (EGD) WITH PROPOFOL BIOPSY  Patient Location: PACU  Anesthesia Type:General  Level of Consciousness: awake, alert , and patient cooperative  Airway & Oxygen Therapy: Patient Spontanous Breathing  Post-op Assessment: Report given to RN and Post -op Vital signs reviewed and stable  Post vital signs: Reviewed and stable  Last Vitals:  Vitals Value Taken Time  BP 125/77 08/15/22   1057  Temp 36.6 08/15/22   1057  Pulse 71 08/15/22   1057  Resp 14 08/15/22   1057  SpO2 99% 08/15/22   1057    Last Pain:  Vitals:   08/15/22 0902  TempSrc: Oral  PainSc: 0-No pain         Complications: No notable events documented.

## 2022-08-15 NOTE — Discharge Instructions (Signed)
EGD Discharge instructions Please read the instructions outlined below and refer to this sheet in the next few weeks. These discharge instructions provide you with general information on caring for yourself after you leave the hospital. Your doctor may also give you specific instructions. While your treatment has been planned according to the most current medical practices available, unavoidable complications occasionally occur. If you have any problems or questions after discharge, please call your doctor. ACTIVITY You may resume your regular activity but move at a slower pace for the next 24 hours.  Take frequent rest periods for the next 24 hours.  Walking will help expel (get rid of) the air and reduce the bloated feeling in your abdomen.  No driving for 24 hours (because of the anesthesia (medicine) used during the test).  You may shower.  Do not sign any important legal documents or operate any machinery for 24 hours (because of the anesthesia used during the test).  NUTRITION Drink plenty of fluids.  You may resume your normal diet.  Begin with a light meal and progress to your normal diet.  Avoid alcoholic beverages for 24 hours or as instructed by your caregiver.  MEDICATIONS You may resume your normal medications unless your caregiver tells you otherwise.  WHAT YOU CAN EXPECT TODAY You may experience abdominal discomfort such as a feeling of fullness or "gas" pains.  FOLLOW-UP Your doctor will discuss the results of your test with you.  SEEK IMMEDIATE MEDICAL ATTENTION IF ANY OF THE FOLLOWING OCCUR: Excessive nausea (feeling sick to your stomach) and/or vomiting.  Severe abdominal pain and distention (swelling).  Trouble swallowing.  Temperature over 101 F (37.8 C).  Rectal bleeding or vomiting of blood.    Colonoscopy Discharge Instructions  Read the instructions outlined below and refer to this sheet in the next few weeks. These discharge instructions provide you with  general information on caring for yourself after you leave the hospital. Your doctor may also give you specific instructions. While your treatment has been planned according to the most current medical practices available, unavoidable complications occasionally occur. If you have any problems or questions after discharge, call Dr. Gala Romney at [phone]. ACTIVITY You may resume your regular activity, but move at a slower pace for the next 24 hours.  Take frequent rest periods for the next 24 hours.  Walking will help get rid of the air and reduce the bloated feeling in your belly (abdomen).  No driving for 24 hours (because of the medicine (anesthesia) used during the test).   Do not sign any important legal documents or operate any machinery for 24 hours (because of the anesthesia used during the test).  NUTRITION Drink plenty of fluids.  You may resume your normal diet as instructed by your doctor.  Begin with a light meal and progress to your normal diet. Heavy or fried foods are harder to digest and may make you feel sick to your stomach (nauseated).  Avoid alcoholic beverages for 24 hours or as instructed.  MEDICATIONS You may resume your normal medications unless your doctor tells you otherwise.  WHAT YOU CAN EXPECT TODAY Some feelings of bloating in the abdomen.  Passage of more gas than usual.  Spotting of blood in your stool or on the toilet paper.  IF YOU HAD POLYPS REMOVED DURING THE COLONOSCOPY: No aspirin products for 7 days or as instructed.  No alcohol for 7 days or as instructed.  Eat a soft diet for the next 24 hours.  FINDING  OUT THE RESULTS OF YOUR TEST Not all test results are available during your visit. If your test results are not back during the visit, make an appointment with your caregiver to find out the results. Do not assume everything is normal if you have not heard from your caregiver or the medical facility. It is important for you to follow up on all of your test  results.  SEEK IMMEDIATE MEDICAL ATTENTION IF: You have more than a spotting of blood in your stool.  Your belly is swollen (abdominal distention).  You are nauseated or vomiting.  You have a temperature over 101.     Your esophagus was dilated today.    You have a small gastric ulcer.  Biopsies taken.-Likely related to NSAID medication     begin Protonix 40 mg orally twice daily 30 minutes before breakfast and supper.  A new prescription is already been sent to your pharmacy from my office     you also had an ulcer in your colon probably related to NSAIDs as well.  1 small polyp removed      I recommend  you minimize NSAID agents is much as possible.    office visit with [place] in 3 months  As discussed, discuss sleep apnea with your primary care doctor.  Some of your throat clearing may be related to untreated sleep apnea   further recommendations to follow pending review of pathology report    at patient request, I called Brendia Sacks [phone] findings      Further recommendations to follow pending review of pathology report       at patient request, I called [name] at [phone] You have abdominal pain or discomfort that is severe or gets worse throughout the day.

## 2022-08-15 NOTE — Op Note (Signed)
[facility] Patient Name: [name] Procedure Date: 08/15/2022 9:56 AM MRN: [id number] Date of Birth: 1950/03/18 Attending MD: Gennette Pac , MD, [id number] CSN: [id number] Age: 72 Admit Type: Outpatient Procedure:                Colonoscopy Indications:              High risk colon cancer surveillance: Personal                            history of colonic polyps Providers:                Gennette Pac, MD, Nena Polio, RN, Pandora Leiter, Technician Referring MD:              Medicines:                Propofol per Anesthesia Complications:            No immediate complications. Estimated Blood Loss:     Estimated blood loss was minimal. Procedure:                Pre-Anesthesia Assessment:                           - Prior to the procedure, a History and Physical                            was performed, and patient medications and                            allergies were reviewed. The patient's tolerance of                            previous anesthesia was also reviewed. The risks                            and benefits of the procedure and the sedation                            options and risks were discussed with the patient.                            All questions were answered, and informed consent                            was obtained. Prior Anticoagulants: The patient has                            taken no anticoagulant or antiplatelet agents. ASA                            Grade Assessment: III - A patient with severe  systemic disease. After reviewing the risks and                            benefits, the patient was deemed in satisfactory                            condition to undergo the procedure.                           After obtaining informed consent, the colonoscope                            was passed under direct vision. Throughout the                            procedure,  the patient's blood pressure, pulse, and                            oxygen saturations were monitored continuously. The                            [id number]) scope was introduced through the                            anus and advanced to the 5 cm into the ileum. The                            colonoscopy was performed without difficulty. The                            patient tolerated the procedure well. The quality                            of the bowel preparation was adequate. The terminal                            ileum, ileocecal valve, appendiceal orifice, and                            rectum were photographed. Scope In: 10:33:47 AM Scope Out: 10:50:09 AM Scope Withdrawal Time: 0 hours 9 minutes 49 seconds  Total Procedure Duration: 0 hours 16 minutes 22 seconds  Findings:      The perianal and digital rectal examinations were normal.      Scattered small-mouthed diverticula were found in the entire colon. (1)       1 cm stellate shaped ulcer on the distal side of the ileocecal valve.       The terminal ileum appeared normal.      A 4 mm polyp was found in the descending colon. The polyp was sessile.       The polyp was removed with a cold snare. Resection and retrieval were       complete. Estimated blood loss was minimal. Impression:               - Diverticulosis in the entire examined  colon.                            Normal terminal ileum. Ulcer distal side of the                            ileocecal valve?"likely NSAID induced. Status post                            biopsy.                           - One 4 mm polyp in the descending colon, removed                            with a cold snare. Resected and retrieved. Moderate Sedation:      Moderate (conscious) sedation was personally administered by an       anesthesia professional. The following parameters were monitored: oxygen       saturation, heart rate, blood pressure, respiratory rate, EKG, adequacy        of pulmonary ventilation, and response to care. Recommendation:           - Patient has a contact number available for                            emergencies. The signs and symptoms of potential                            delayed complications were discussed with the                            patient. Return to normal activities tomorrow.                            Written discharge instructions were provided to the                            patient.                           - Advance diet as tolerated. Limit NSAID use.                            Further recommendations to follow. See EGD report. Procedure Code(s):        --- Professional ---                           [phone], Colonoscopy, flexible; with removal of                            tumor(s), polyp(s), or other lesion(s) by snare                            technique Diagnosis Code(s):        --- Professional ---  Z86.010, Personal history of colonic polyps                           D12.4, Benign neoplasm of descending colon                           K57.30, Diverticulosis of large intestine without                            perforation or abscess without bleeding CPT copyright 2022 American Medical Association. All rights reserved. The codes documented in this report are preliminary and upon coder review may  be revised to meet current compliance requirements. [name]. Tasha Jindra, MD Gennette Pac, MD 08/15/2022 11:11:54 AM This report has been signed electronically. Number of Addenda: 0

## 2022-08-15 NOTE — Telephone Encounter (Signed)
-----   Message from Corbin Ade, MD sent at 08/15/2022 10:55 AM EDT -----   Patient has a gastric ulcer.  Needs new prescription Protonix 40 mg pill.  Dispense 60 with 3 refills.  Take 1 pill twice daily 30 minutes before meals.

## 2022-08-15 NOTE — Telephone Encounter (Signed)
Rx sent to pharmacy on file.
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
## 2022-08-15 NOTE — Op Note (Signed)
[facility] Patient Name: [name] Procedure Date: 08/15/2022 9:59 AM MRN: [id number] Date of Birth: October 15, 1950 Attending MD: Gennette Pac , MD, [id number] CSN: [id number] Age: 72 Admit Type: Outpatient Procedure:                Upper GI endoscopy Indications:              Dysphagia Providers:                Gennette Pac, MD, Nena Polio, RN, Pandora Leiter, Technician Referring MD:              Medicines:                Propofol per Anesthesia Complications:            No immediate complications. Estimated Blood Loss:     Estimated blood loss was minimal. Procedure:                Pre-Anesthesia Assessment:                           - Prior to the procedure, a History and Physical                            was performed, and patient medications and                            allergies were reviewed. The patient's tolerance of                            previous anesthesia was also reviewed. The risks                            and benefits of the procedure and the sedation                            options and risks were discussed with the patient.                            All questions were answered, and informed consent                            was obtained. Prior Anticoagulants: The patient has                            taken no anticoagulant or antiplatelet agents. ASA                            Grade Assessment: III - A patient with severe                            systemic disease. After reviewing the risks and  benefits, the patient was deemed in satisfactory                            condition to undergo the procedure.                           After obtaining informed consent, the endoscope was                            passed under direct vision. Throughout the                            procedure, the patient's blood pressure, pulse, and                            oxygen saturations  were monitored continuously. The                            [id number] ([id number] scope was introduced through the                            mouth, and advanced to the second part of duodenum.                            The upper GI endoscopy was accomplished without                            difficulty. The patient tolerated the procedure                            well. Scope In: 10:18:33 AM Scope Out: 10:27:38 AM Total Procedure Duration: 0 hours 9 minutes 5 seconds  Findings:      The examined esophagus was normal. The scope was withdrawn. Dilation was       performed with a Maloney dilator with mild resistance at 56 Fr. The       dilation site was examined and showed no change. Estimated blood loss:       none.      One cratered gastric ulcer was found in the gastric antrum. The lesion       was 4 mm in largest dimension. Clean base. Surrounding erosions. No       infiltrating process seen. Patent pylorus.      The duodenal bulb and second portion of the duodenum were normal. Impression:               - Normal esophagus. Dilated.                           - Gastric ulcer. biopsied                           - Normal duodenal bulb and second portion of the                            duodenum.                           -  Moderate Sedation:      Moderate (conscious) sedation was personally administered by an       anesthesia professional. The following parameters were monitored: oxygen       saturation, heart rate, blood pressure, respiratory rate, EKG, adequacy       of pulmonary ventilation, and response to care. Recommendation:           - Patient has a contact number available for                            emergencies. The signs and symptoms of potential                            delayed complications were discussed with the                            patient. Return to normal activities tomorrow.                            Written discharge instructions were provided to the                             patient.                           - Advance diet as tolerated. Begin Protonix 40 mg                            twice daily. Limit NSAID use. Follow-up on                            pathology. See colonoscopy report Procedure Code(s):        --- Professional ---                           [phone], Esophagogastroduodenoscopy, flexible,                            transoral; diagnostic, including collection of                            specimen(s) by brushing or washing, when performed                            (separate procedure)                           43450, Dilation of esophagus, by unguided sound or                            bougie, single or multiple passes Diagnosis Code(s):        --- Professional ---                           K25.9, Gastric ulcer, unspecified as acute or  chronic, without hemorrhage or perforation                           R13.10, Dysphagia, unspecified CPT copyright 2022 American Medical Association. All rights reserved. The codes documented in this report are preliminary and upon coder review may  be revised to meet current compliance requirements. [name]. Jose Casanas, MD Gennette Pac, MD 08/15/2022 11:08:18 AM This report has been signed electronically. Number of Addenda: 0

## 2022-08-18 LAB — SURGICAL PATHOLOGY

## 2022-08-18 NOTE — Anesthesia Postprocedure Evaluation (Signed)
Anesthesia Post Note  Patient: [name]  Procedure(s) Performed: COLONOSCOPY WITH PROPOFOL ESOPHAGOGASTRODUODENOSCOPY (EGD) WITH PROPOFOL BIOPSY  Patient location during evaluation: Phase II Anesthesia Type: General Level of consciousness: awake Pain management: pain level controlled Vital Signs Assessment: post-procedure vital signs reviewed and stable Respiratory status: spontaneous breathing and respiratory function stable Cardiovascular status: blood pressure returned to baseline and stable Postop Assessment: no headache and no apparent nausea or vomiting Anesthetic complications: no Comments: Late entry   No notable events documented.   Last Vitals:  Vitals:   08/15/22 0902 08/15/22 1057  BP: 135/82 125/77  Pulse: 61 72  Resp: 12 14  Temp: 36.5 C 36.6 C  SpO2: 98% 99%    Last Pain:  Vitals:   08/18/22 1132  TempSrc:   PainSc: 0-No pain                 [name]

## 2022-08-19 ENCOUNTER — Telehealth: Payer: Self-pay | Admitting: Orthopedic Surgery

## 2022-08-19 ENCOUNTER — Other Ambulatory Visit: Payer: Self-pay | Admitting: Orthopedic Surgery

## 2022-08-19 DIAGNOSIS — S83231A Complex tear of medial meniscus, current injury, right knee, initial encounter: Secondary | ICD-10-CM

## 2022-08-19 NOTE — Telephone Encounter (Signed)
I have sent message to patient via my chart.

## 2022-08-19 NOTE — Telephone Encounter (Signed)
Dr. Mort Sawyers pt - pt lvm for [name], he would like a call back to discuss scheduling surgery.

## 2022-08-19 NOTE — Telephone Encounter (Signed)
I have put in orders for surgery he can arrange care for his wife and proceed with surgery on July 23rd  He has had to d/c NSAIDs due to stomach ulcers Wants to know if you can recommend anything else? He is stating he is using Tylenol with no relief and would like something please  [facility]

## 2022-08-19 NOTE — Progress Notes (Signed)
Plan arthroscopy right knee partial medial meniscectomy

## 2022-08-21 ENCOUNTER — Encounter: Payer: Self-pay | Admitting: Internal Medicine

## 2022-08-22 ENCOUNTER — Telehealth: Payer: Self-pay

## 2022-08-22 NOTE — Telephone Encounter (Signed)
Letter has been mailed to the pt and follow up appt has been made.

## 2022-08-22 NOTE — Telephone Encounter (Signed)
-----   Message from Corbin Ade, MD sent at 08/21/2022  1:49 PM EDT -----   See letter going out today.  Patient needs a follow-up appointment with app in 6 to 8 weeks.

## 2022-08-25 ENCOUNTER — Encounter (HOSPITAL_COMMUNITY): Payer: Self-pay | Admitting: Internal Medicine

## 2022-08-26 ENCOUNTER — Encounter: Payer: Self-pay | Admitting: Cardiovascular Disease

## 2022-08-26 ENCOUNTER — Ambulatory Visit: Payer: Medicare Other | Attending: Cardiovascular Disease | Admitting: Cardiovascular Disease

## 2022-08-26 VITALS — BP 138/82 | HR 62 | Ht 74.0 in | Wt 225.6 lb

## 2022-08-26 DIAGNOSIS — Q2112 Patent foramen ovale: Secondary | ICD-10-CM | POA: Insufficient documentation

## 2022-08-26 DIAGNOSIS — I7121 Aneurysm of the ascending aorta, without rupture: Secondary | ICD-10-CM | POA: Diagnosis not present

## 2022-08-26 DIAGNOSIS — R001 Bradycardia, unspecified: Secondary | ICD-10-CM | POA: Diagnosis not present

## 2022-08-26 DIAGNOSIS — I10 Essential (primary) hypertension: Secondary | ICD-10-CM | POA: Insufficient documentation

## 2022-08-26 NOTE — Patient Instructions (Signed)
Medication Instructions:  Your physician recommends that you continue on your current medications as directed. Please refer to the Current Medication list given to you today.   Labwork: None today  Testing/Procedures: None today  Follow-Up: 1 year  Any Other Special Instructions Will Be Listed Below (If Applicable).  If you need a refill on your cardiac medications before your next appointment, please call your pharmacy.  

## 2022-09-05 ENCOUNTER — Other Ambulatory Visit: Payer: Self-pay

## 2022-09-05 ENCOUNTER — Encounter (HOSPITAL_COMMUNITY)
Admission: RE | Admit: 2022-09-05 | Discharge: 2022-09-05 | Disposition: A | Discharge status: Home / Self Care | Payer: Medicare Other | Source: Ambulatory Visit | Attending: Orthopedic Surgery | Admitting: Orthopedic Surgery

## 2022-09-05 ENCOUNTER — Encounter (HOSPITAL_COMMUNITY): Payer: Self-pay
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
## 2022-09-09 ENCOUNTER — Other Ambulatory Visit: Payer: Self-pay

## 2022-09-09 ENCOUNTER — Ambulatory Visit (HOSPITAL_COMMUNITY)
Admission: RE | Admit: 2022-09-09 | Discharge: 2022-09-09 | Disposition: A | Discharge status: Home / Self Care | Payer: Medicare Other | Source: Ambulatory Visit | Attending: Orthopedic Surgery | Admitting: Orthopedic Surgery

## 2022-09-09 ENCOUNTER — Ambulatory Visit (HOSPITAL_COMMUNITY): Payer: Medicare Other | Admitting: Registered Nurse

## 2022-09-09 ENCOUNTER — Ambulatory Visit (HOSPITAL_BASED_OUTPATIENT_CLINIC_OR_DEPARTMENT_OTHER): Payer: Medicare Other | Admitting: Registered Nurse

## 2022-09-09 ENCOUNTER — Encounter (HOSPITAL_COMMUNITY): Payer: Self-pay | Admitting: Orthopedic Surgery

## 2022-09-09 ENCOUNTER — Encounter (HOSPITAL_COMMUNITY)
Admission: RE | Disposition: A | Discharge status: Home / Self Care | Payer: Self-pay | Source: Ambulatory Visit | Attending: Orthopedic Surgery

## 2022-09-09 DIAGNOSIS — F32A Depression, unspecified: Secondary | ICD-10-CM | POA: Diagnosis not present

## 2022-09-09 DIAGNOSIS — S83241A Other tear of medial meniscus, current injury, right knee, initial encounter: Secondary | ICD-10-CM | POA: Diagnosis not present

## 2022-09-09 DIAGNOSIS — G4730 Sleep apnea, unspecified: Secondary | ICD-10-CM | POA: Diagnosis not present

## 2022-09-09 DIAGNOSIS — M1711 Unilateral primary osteoarthritis, right knee: Secondary | ICD-10-CM

## 2022-09-09 DIAGNOSIS — F4310 Post-traumatic stress disorder, unspecified: Secondary | ICD-10-CM | POA: Diagnosis not present

## 2022-09-09 DIAGNOSIS — I2510 Atherosclerotic heart disease of native coronary artery without angina pectoris: Secondary | ICD-10-CM | POA: Diagnosis not present

## 2022-09-09 DIAGNOSIS — F419 Anxiety disorder, unspecified: Secondary | ICD-10-CM | POA: Diagnosis not present

## 2022-09-09 DIAGNOSIS — X58XXXA Exposure to other specified factors, initial encounter: Secondary | ICD-10-CM | POA: Diagnosis not present

## 2022-09-09 DIAGNOSIS — Z87891 Personal history of nicotine dependence: Secondary | ICD-10-CM | POA: Diagnosis not present

## 2022-09-09 DIAGNOSIS — I69354 Hemiplegia and hemiparesis following cerebral infarction affecting left non-dominant side: Secondary | ICD-10-CM | POA: Diagnosis not present

## 2022-09-09 DIAGNOSIS — I10 Essential (primary) hypertension: Secondary | ICD-10-CM | POA: Insufficient documentation

## 2022-09-09 DIAGNOSIS — J449 Chronic obstructive pulmonary disease, unspecified: Secondary | ICD-10-CM | POA: Insufficient documentation

## 2022-09-09 DIAGNOSIS — S83241D Other tear of medial meniscus, current injury, right knee, subsequent encounter: Secondary | ICD-10-CM

## 2022-09-09 DIAGNOSIS — G709 Myoneural disorder, unspecified: Secondary | ICD-10-CM | POA: Diagnosis not present

## 2022-09-09 DIAGNOSIS — M23321 Other meniscus derangements, posterior horn of medial meniscus, right knee: Secondary | ICD-10-CM | POA: Diagnosis not present

## 2022-09-09 HISTORY — PX: KNEE ARTHROSCOPY WITH MEDIAL MENISECTOMY: SHX5651

## 2022-09-09 SURGERY — ARTHROSCOPY, KNEE, WITH MEDIAL MENISCECTOMY
Anesthesia: General | Site: Knee | Laterality: Right

## 2022-09-09 MED ORDER — LIDOCAINE 2% (20 MG/ML) 5 ML SYRINGE
INTRAMUSCULAR | Status: DC | PRN
  Administered 2022-09-09: 60 mg via INTRAVENOUS

## 2022-09-09 MED ORDER — SODIUM CHLORIDE 0.9 % IR SOLN
Status: DC | PRN
  Administered 2022-09-09 (×3): 3000 mL

## 2022-09-09 MED ORDER — ORAL CARE MOUTH RINSE: 15.0000 mL | Freq: Once | OROMUCOSAL | Status: DC

## 2022-09-09 MED ORDER — OXYCODONE HCL 5 MG/5ML PO SOLN: 5.0000 mg | Freq: Once | ORAL | Status: DC | PRN

## 2022-09-09 MED ORDER — FENTANYL CITRATE (PF) 100 MCG/2ML IJ SOLN
INTRAMUSCULAR | Status: AC
  Filled 2022-09-09: qty 2

## 2022-09-09 MED ORDER — CHLORHEXIDINE GLUCONATE 0.12 % MT SOLN: 15.0000 mL | Freq: Once | OROMUCOSAL | Status: DC

## 2022-09-09 MED ORDER — PHENYLEPHRINE HCL (PRESSORS) 10 MG/ML IV SOLN
INTRAVENOUS | Status: DC | PRN
  Administered 2022-09-09 (×3): 80 ug via INTRAVENOUS

## 2022-09-09 MED ORDER — CEFAZOLIN SODIUM-DEXTROSE 2-4 GM/100ML-% IV SOLN
2.0000 g | INTRAVENOUS | Status: AC
  Administered 2022-09-09: 2 g via INTRAVENOUS

## 2022-09-09 MED ORDER — LACTATED RINGERS IV SOLN: INTRAVENOUS | Status: DC

## 2022-09-09 MED ORDER — BUPIVACAINE-EPINEPHRINE (PF) 0.5% -1:200000 IJ SOLN
INTRAMUSCULAR | Status: DC | PRN
  Administered 2022-09-09: 20 mL via PERINEURAL

## 2022-09-09 MED ORDER — OXYCODONE-ACETAMINOPHEN 5-325 MG PO TABS
ORAL_TABLET | ORAL | Status: AC
  Filled 2022-09-09: qty 1

## 2022-09-09 MED ORDER — ONDANSETRON HCL 4 MG/2ML IJ SOLN
INTRAMUSCULAR | Status: AC
  Filled 2022-09-09: qty 2

## 2022-09-09 MED ORDER — BUPIVACAINE-EPINEPHRINE (PF) 0.5% -1:200000 IJ SOLN
INTRAMUSCULAR | Status: AC
  Filled 2022-09-09: qty 60

## 2022-09-09 MED ORDER — DEXAMETHASONE SODIUM PHOSPHATE 10 MG/ML IJ SOLN
INTRAMUSCULAR | Status: DC | PRN
  Administered 2022-09-09: 5 mg via INTRAVENOUS

## 2022-09-09 MED ORDER — CEFAZOLIN SODIUM-DEXTROSE 2-4 GM/100ML-% IV SOLN
INTRAVENOUS | Status: AC
  Filled 2022-09-09: qty 100

## 2022-09-09 MED ORDER — KETOROLAC TROMETHAMINE 30 MG/ML IJ SOLN
30.0000 mg | Freq: Once | INTRAMUSCULAR | Status: AC
  Administered 2022-09-09: 30 mg via INTRAVENOUS

## 2022-09-09 MED ORDER — PROMETHAZINE HCL 12.5 MG PO TABS: 12.5000 mg | ORAL_TABLET | Freq: Four times a day (QID) | ORAL | 0 refills | Status: DC | PRN

## 2022-09-09 MED ORDER — FENTANYL CITRATE (PF) 250 MCG/5ML IJ SOLN
INTRAMUSCULAR | Status: DC | PRN
  Administered 2022-09-09 (×4): 50 ug via INTRAVENOUS

## 2022-09-09 MED ORDER — ONDANSETRON HCL 4 MG/2ML IJ SOLN: 4.0000 mg | Freq: Once | INTRAMUSCULAR | Status: DC | PRN

## 2022-09-09 MED ORDER — FENTANYL CITRATE PF 50 MCG/ML IJ SOSY
25.0000 ug | PREFILLED_SYRINGE | INTRAMUSCULAR | Status: DC | PRN
  Administered 2022-09-09: 50 ug via INTRAVENOUS
  Filled 2022-09-09: qty 1

## 2022-09-09 MED ORDER — ONDANSETRON HCL 4 MG/2ML IJ SOLN
4.0000 mg | Freq: Once | INTRAMUSCULAR | Status: AC
  Administered 2022-09-09: 4 mg via INTRAVENOUS

## 2022-09-09 MED ORDER — PHENYLEPHRINE HCL-NACL 20-0.9 MG/250ML-% IV SOLN
INTRAVENOUS | Status: DC | PRN
  Administered 2022-09-09: 30 ug/min via INTRAVENOUS

## 2022-09-09 MED ORDER — OXYCODONE HCL 5 MG PO TABS: 5.0000 mg | ORAL_TABLET | Freq: Once | ORAL | Status: DC | PRN

## 2022-09-09 MED ORDER — KETOROLAC TROMETHAMINE 30 MG/ML IJ SOLN
INTRAMUSCULAR | Status: AC
  Filled 2022-09-09: qty 1

## 2022-09-09 MED ORDER — OXYCODONE-ACETAMINOPHEN 5-325 MG PO TABS: 1.0000 | ORAL_TABLET | ORAL | 0 refills | Status: DC | PRN

## 2022-09-09 MED ORDER — EPHEDRINE SULFATE-NACL 50-0.9 MG/10ML-% IV SOSY
PREFILLED_SYRINGE | INTRAVENOUS | Status: DC | PRN
  Administered 2022-09-09 (×2): 10 mg via INTRAVENOUS

## 2022-09-09 MED ORDER — OXYCODONE-ACETAMINOPHEN 5-325 MG PO TABS
1.0000 | ORAL_TABLET | ORAL | Status: DC | PRN
  Administered 2022-09-09: 1 via ORAL

## 2022-09-09 MED ORDER — ONDANSETRON HCL 4 MG/2ML IJ SOLN
INTRAMUSCULAR | Status: DC | PRN
  Administered 2022-09-09: 4 mg via INTRAVENOUS

## 2022-09-09 MED ORDER — PROPOFOL 10 MG/ML IV BOLUS
INTRAVENOUS | Status: DC | PRN
  Administered 2022-09-09: 100 mg via INTRAVENOUS
  Administered 2022-09-09: 200 mg via INTRAVENOUS

## 2022-09-09 SURGICAL SUPPLY — 47 items
ABLATOR ASPIRATE 50D MULTI-PRT (SURGICAL WAND) IMPLANT
APL PRP STRL LF DISP 70% ISPRP (MISCELLANEOUS) ×1
BAG HAMPER (MISCELLANEOUS) ×2 IMPLANT
BLADE SHAVER TORPEDO 4X13 (MISCELLANEOUS) IMPLANT
BNDG CMPR STD VLCR NS LF 5.8X6 (GAUZE/BANDAGES/DRESSINGS) ×1
BNDG ELASTIC 6X5.8 VLCR NS LF (GAUZE/BANDAGES/DRESSINGS) ×2 IMPLANT
CHLORAPREP W/TINT 26 (MISCELLANEOUS) ×2 IMPLANT
CLOTH BEACON ORANGE TIMEOUT ST (SAFETY) ×2 IMPLANT
COOLER ICEMAN CLASSIC (MISCELLANEOUS) ×2 IMPLANT
COVER LIGHT HANDLE STERIS (MISCELLANEOUS) ×4 IMPLANT
CUFF TOURN SGL QUICK 34 (TOURNIQUET CUFF) ×1
CUFF TRNQT CYL 34X4.125X (TOURNIQUET CUFF) IMPLANT
GAUZE SPONGE 4X4 12PLY STRL (GAUZE/BANDAGES/DRESSINGS) ×2 IMPLANT
GAUZE XEROFORM 5X9 LF (GAUZE/BANDAGES/DRESSINGS) ×2 IMPLANT
GLOVE BIO SURGEON STRL SZ7 (GLOVE) IMPLANT
GLOVE BIOGEL PI IND STRL 7.0 (GLOVE) ×4 IMPLANT
GLOVE SS N UNI LF 8.5 STRL (GLOVE) ×2 IMPLANT
GLOVE SURG POLYISO LF SZ8 (GLOVE) ×2 IMPLANT
GOWN STRL REUS W/TWL LRG LVL3 (GOWN DISPOSABLE) ×2 IMPLANT
GOWN STRL REUS W/TWL XL LVL3 (GOWN DISPOSABLE) ×2 IMPLANT
IV NS IRRIG 3000ML ARTHROMATIC (IV SOLUTION) ×4 IMPLANT
KIT BLADEGUARD II DBL (SET/KITS/TRAYS/PACK) ×2 IMPLANT
KIT TURNOVER CYSTO (KITS) ×2 IMPLANT
MANIFOLD NEPTUNE II (INSTRUMENTS) ×2 IMPLANT
MARKER SKIN DUAL TIP RULER LAB (MISCELLANEOUS) ×2 IMPLANT
NDL HYPO 18GX1.5 BLUNT FILL (NEEDLE) ×2 IMPLANT
NDL HYPO 21X1.5 SAFETY (NEEDLE) ×2 IMPLANT
NDL SPNL 18GX3.5 QUINCKE PK (NEEDLE) ×2 IMPLANT
NEEDLE HYPO 18GX1.5 BLUNT FILL (NEEDLE) ×1 IMPLANT
NEEDLE HYPO 21X1.5 SAFETY (NEEDLE) ×1 IMPLANT
NEEDLE SPNL 18GX3.5 QUINCKE PK (NEEDLE) ×1 IMPLANT
PACK ARTHROSCOPY WL (CUSTOM PROCEDURE TRAY) ×2 IMPLANT
PAD ABD 5X9 TENDERSORB (GAUZE/BANDAGES/DRESSINGS) ×2 IMPLANT
PAD ARMBOARD 7.5X6 YLW CONV (MISCELLANEOUS) ×2 IMPLANT
PAD COLD SHLDR SM WRAP-ON (PAD) IMPLANT
PAD FOR LEG HOLDER (MISCELLANEOUS) ×2 IMPLANT
PADDING CAST COTTON 6X4 STRL (CAST SUPPLIES) ×2 IMPLANT
POSITIONER HEAD 8X9X4 ADT (SOFTGOODS) ×2 IMPLANT
RESECTOR TORPEDO 4MM 13CM CVD (MISCELLANEOUS) IMPLANT
SET ARTHROSCOPY INST (INSTRUMENTS) ×2 IMPLANT
SET BASIN LINEN APH (SET/KITS/TRAYS/PACK) ×2 IMPLANT
SOL ANTI FOG 6CC (MISCELLANEOUS) IMPLANT
SUT ETHILON 3 0 FSL (SUTURE) ×2 IMPLANT
SYR 10ML LL (SYRINGE) ×2 IMPLANT
SYR 30ML LL (SYRINGE) ×4 IMPLANT
TUBE CONNECTING 12X1/4 (SUCTIONS) IMPLANT
TUBING IN/OUT FLOW W/MAIN PUMP (TUBING) ×2 IMPLANT

## 2022-09-09 NOTE — Op Note (Signed)
09/09/2022  1:05 PM  Knee arthroscopy dictation  Preop diagnosis torn medial meniscus osteoarthritis right knee with stress fracture/stress reaction  Postop diagnosis same  Procedure arthroscopy right knee partial medial meniscectomy and chondroplasty medial femoral condyle  Surgeon Romeo Apple  Operative findings  MEDIAL - meniscus posterior horn meniscus tear with flipped fragment into the medial gutter -articular surface grade 3 chondral changes medial femoral condyle  LATERAL - meniscus fraying of the free edges no tear -articular surface normal  PTF   -articular surface grade 2  NOTCH  -acl normal -pcl normal     The patient was identified in the preoperative holding area using 2 approved identification mechanisms. The chart was reviewed and updated. The surgical site was confirmed as right knee and marked with an indelible marker.  The patient was taken to the operating room for anesthesia. After successful general anesthesia, Ancef was used as IV antibiotics.  The patient was placed in the supine position with the (right) the operative extremity in an arthroscopic leg holder and the opposite extremity in a padded leg holder.  The timeout was executed.  A lateral portal was established with an 11 blade and the scope was introduced into the joint. A diagnostic arthroscopy was performed in circumferential manner examining the entire knee joint. A medial portal was established and the diagnostic arthroscopy was repeated using a probe to palpate intra-articular structures as they were encountered.    The medial meniscus was resected using a duckbill forceps. The meniscal fragments were removed with a motorized shaver. The meniscus was balanced with a combination of a motorized shaver and a 50 ArthroCare wand until a stable rim was obtained.  Confirmed by probing  A rasp was used to perform a chondroplasty of the medial femoral condyle  The arthroscopic pump was placed  on the wash mode and any excess debris was removed from the joint using suction.  60 cc of Marcaine with epinephrine was injected through the arthroscope.  The portals were closed with 3-0 nylon suture.  A sterile bandage, Ace wrap and Cryo/Cuff was placed and the Cryo/Cuff was activated. The patient was taken to the recovery room in stable condition.  PHYSICIAN ASSISTANT: no  ASSISTANTS: none   ANESTHESIA:   General  EBL:  none   BLOOD ADMINISTERED:none  DRAINS: none   LOCAL MEDICATIONS USED:  MARCAINE     SPECIMEN:  No Specimen  DISPOSITION OF SPECIMEN:  N/A  COUNTS:  YES   DICTATION: .Dragon Dictation  PLAN OF CARE: Discharge to home after PACU  PATIENT DISPOSITION:  PACU - hemodynamically stable.   Delay start of Pharmacological VTE agent (>24hrs) due to surgical blood loss or risk of bleeding: not applicable  POST OP PLAN  WB as tolerated SUTURES OUT IN A WEEK  AROM  BRACE yes after swelling goes down for approximately 6 weeks

## 2022-09-09 NOTE — Brief Op Note (Signed)
09/09/2022  1:04 PM  PATIENT:  [name]  72 y.o. male  PRE-OPERATIVE DIAGNOSIS:  Torn medial meniscus right knee  POST-OPERATIVE DIAGNOSIS:  Torn medial meniscus right knee  PROCEDURE:  Procedure(s): KNEE ARTHROSCOPY WITH MEDIAL MENISCECTOMY AND CHONDROPLASTY (Right)  SURGEON:  Surgeons and Role:    Vickki Hearing, MD - Primary  PHYSICIAN ASSISTANT:   ASSISTANTS: none   ANESTHESIA:   general  EBL:  minimal    BLOOD ADMINISTERED:none  DRAINS: none   LOCAL MEDICATIONS USED:  MARCAINE     SPECIMEN:  No Specimen  DISPOSITION OF SPECIMEN:  N/A  COUNTS:  YES  TOURNIQUET:  * Missing tourniquet times found for documented tourniquets in log: [id number] *  DICTATION: .Dragon Dictation  PLAN OF CARE: Discharge to home after PACU  PATIENT DISPOSITION:  PACU - hemodynamically stable.   Delay start of Pharmacological VTE agent (>24hrs) due to surgical blood loss or risk of bleeding: not applicable

## 2022-09-09 NOTE — Anesthesia Procedure Notes (Signed)
Procedure Name: LMA Insertion Date/Time: 09/09/2022 11:59 AM  Performed by: Marquis Buggy, CRNAPre-anesthesia Checklist: Patient identified, Emergency Drugs available, Suction available and Patient being monitored Patient Re-evaluated:Patient Re-evaluated prior to induction Oxygen Delivery Method: Circle System Utilized Preoxygenation: Pre-oxygenation with 100% oxygen Induction Type: IV induction Ventilation: Mask ventilation without difficulty LMA: LMA inserted LMA Size: 5.0 Number of attempts: 1 Placement Confirmation: positive ETCO2 Tube secured with: Tape Dental Injury: Teeth and Oropharynx as per pre-operative assessment

## 2022-09-09 NOTE — Transfer of Care (Signed)
Immediate Anesthesia Transfer of Care Note  Patient: [name]  Procedure(s) Performed: KNEE ARTHROSCOPY WITH MEDIAL MENISCECTOMY AND CHONDROPLASTY (Right: Knee)  Patient Location: PACU  Anesthesia Type:General  Level of Consciousness: awake, alert , and oriented  Airway & Oxygen Therapy: Patient Spontanous Breathing  Post-op Assessment: Report given to RN and Post -op Vital signs reviewed and stable  Post vital signs: Reviewed and stable  Last Vitals:  Vitals Value Taken Time  BP 123/67 09/09/22 1308  Temp 36.7 C 09/09/22 1308  Pulse 68 09/09/22 1309  Resp 17 09/09/22 1309  SpO2 99 % 09/09/22 1309  Vitals shown include unfiled device data.  Last Pain:  Vitals:   09/09/22 1308  TempSrc: Oral  PainSc: 0-No pain         Complications: No notable events documented.

## 2022-09-09 NOTE — Interval H&P Note (Signed)
History and Physical Interval Note:  09/09/2022 11:24 AM  [name]  has presented today for surgery, with the diagnosis of Torn medial meniscus right knee.  The various methods of treatment have been discussed with the patient and family. After consideration of risks, benefits and other options for treatment, the patient has consented to  Procedure(s): KNEE ARTHROSCOPY WITH MEDIAL MENISCECTOMY (Right) as a surgical intervention.  The patient's history has been reviewed, patient examined, no change in status, stable for surgery.  I have reviewed the patient's chart and labs.  Questions were answered to the patient's satisfaction.     Fuller Canada

## 2022-09-09 NOTE — Anesthesia Preprocedure Evaluation (Signed)
Anesthesia Evaluation  Patient identified by MRN, date of birth, ID band Patient awake    Reviewed: Allergy & Precautions, H&P , NPO status , Patient's Chart, lab work & pertinent test results, reviewed documented beta blocker date and time   Airway Mallampati: II  TM Distance: >3 FB Neck ROM: full    Dental no notable dental hx.    Pulmonary neg pulmonary ROS, shortness of breath, sleep apnea , pneumonia, COPD, former smoker   Pulmonary exam normal breath sounds clear to auscultation       Cardiovascular Exercise Tolerance: Good hypertension, + CAD  negative cardio ROS  Rhythm:regular Rate:Normal     Neuro/Psych  Headaches PSYCHIATRIC DISORDERS Anxiety Depression     Neuromuscular disease CVA negative neurological ROS  negative psych ROS   GI/Hepatic negative GI ROS, Neg liver ROS,,,  Endo/Other  negative endocrine ROS    Renal/GU negative Renal ROS  negative genitourinary   Musculoskeletal   Abdominal   Peds  Hematology negative hematology ROS (+)   Anesthesia Other Findings   Reproductive/Obstetrics negative OB ROS                             Anesthesia Physical Anesthesia Plan  ASA: 3  Anesthesia Plan: General and General LMA   Post-op Pain Management:    Induction:   PONV Risk Score and Plan: Ondansetron  Airway Management Planned:   Additional Equipment:   Intra-op Plan:   Post-operative Plan:   Informed Consent: I have reviewed the patients History and Physical, chart, labs and discussed the procedure including the risks, benefits and alternatives for the proposed anesthesia with the patient or authorized representative who has indicated his/her understanding and acceptance.     Dental Advisory Given  Plan Discussed with: CRNA  Anesthesia Plan Comments:        Anesthesia Quick Evaluation

## 2022-09-10 ENCOUNTER — Encounter (HOSPITAL_COMMUNITY): Payer: Self-pay | Admitting: Orthopedic Surgery

## 2022-09-11 ENCOUNTER — Telehealth: Payer: Self-pay | Admitting: Orthopedic Surgery

## 2022-09-11 DIAGNOSIS — Z9889 Other specified postprocedural states: Secondary | ICD-10-CM | POA: Insufficient documentation

## 2022-09-11 NOTE — Telephone Encounter (Signed)
Dr. Mort Sawyers pt - pt called at 4pm today, stated he had surgery 09/09/22 and having pain.  I've added it to the schedule for tomorrow, but he is wanting to know if he needs to up his Oxycodone intake to help w/the pain.

## 2022-09-11 NOTE — Anesthesia Postprocedure Evaluation (Signed)
Anesthesia Post Note  Patient: [name]  Procedure(s) Performed: KNEE ARTHROSCOPY WITH MEDIAL MENISCECTOMY AND CHONDROPLASTY (Right: Knee)  Patient location during evaluation: Phase II Anesthesia Type: General Level of consciousness: awake Pain management: pain level controlled Vital Signs Assessment: post-procedure vital signs reviewed and stable Respiratory status: spontaneous breathing and respiratory function stable Cardiovascular status: blood pressure returned to baseline and stable Postop Assessment: no headache and no apparent nausea or vomiting Anesthetic complications: no Comments: Late entry   No notable events documented.   Last Vitals:  Vitals:   09/09/22 1400 09/09/22 1406  BP: 130/77 126/61  Pulse: 70 70  Resp: 15 16  Temp:  36.6 C  SpO2: 95% 96%    Last Pain:  Vitals:   09/10/22 1029  TempSrc:   PainSc: 0-No pain                 [name]

## 2022-09-12 ENCOUNTER — Encounter: Payer: Self-pay | Admitting: Orthopedic Surgery

## 2022-09-12 ENCOUNTER — Ambulatory Visit (INDEPENDENT_AMBULATORY_CARE_PROVIDER_SITE_OTHER): Payer: Medicare Other

## 2022-09-12 ENCOUNTER — Ambulatory Visit: Payer: Medicare Other | Admitting: Orthopedic Surgery

## 2022-09-12 VITALS — Ht 74.0 in | Wt 225.0 lb

## 2022-09-12 DIAGNOSIS — Z0000 Encounter for general adult medical examination without abnormal findings: Secondary | ICD-10-CM | POA: Diagnosis not present

## 2022-09-12 DIAGNOSIS — Z9889 Other specified postprocedural states: Secondary | ICD-10-CM

## 2022-09-12 MED ORDER — OXYCODONE-ACETAMINOPHEN 5-325 MG PO TABS: 1.0000 | ORAL_TABLET | ORAL | 0 refills | Status: AC | PRN

## 2022-09-12 NOTE — Patient Instructions (Addendum)
[name] , Thank you for taking time to come for your Medicare Wellness Visit. I appreciate your ongoing commitment to your health goals. Please review the following plan we discussed and let me know if I can assist you in the future.   Referrals/Orders/Follow-Ups/Clinician Recommendations:   This is a list of the screening recommended for you and due dates:  Health Maintenance  Topic Date Due   Hepatitis C Screening  Never done   DTaP/Tdap/Td vaccine (1 - Tdap) Never done   Zoster (Shingles) Vaccine (1 of 2) Never done   Pneumonia Vaccine (2 of 2 - PCV) 10/30/2013   COVID-19 Vaccine (6 - 2023-24 season) 10/18/2021   Flu Shot  09/18/2022   Medicare Annual Wellness Visit  09/12/2023   Colon Cancer Screening  08/14/2032   HPV Vaccine  Aged Out   Opioid Pain Medicine Management Opioids are powerful medicines that are used to treat moderate to severe pain. When used for short periods of time, they can help you to: Sleep better. Do better in physical or occupational therapy. Feel better in the first few days after an injury. Recover from surgery. Opioids should be taken with the supervision of a trained health care provider. They should be taken for the shortest period of time possible. This is because opioids can be addictive, and the longer you take opioids, the greater your risk of addiction. This addiction can also be called opioid use disorder. What are the risks? Using opioid pain medicines for longer than 3 days increases your risk of side effects. Side effects include: Constipation. Nausea and vomiting. Breathing difficulties (respiratory depression). Drowsiness. Confusion. Opioid use disorder. Itching. Taking opioid pain medicine for a long period of time can affect your ability to do daily tasks. It also puts you at risk for: Motor vehicle crashes. Depression. Suicide. Heart attack. Overdose, which can be life-threatening. What is a pain treatment plan? A pain  treatment plan is an agreement between you and your health care provider. Pain is unique to each person, and treatments vary depending on your condition. To manage your pain, you and your health care provider need to work together. To help you do this: Discuss the goals of your treatment, including how much pain you might expect to have and how you will manage the pain. Review the risks and benefits of taking opioid medicines. Remember that a good treatment plan uses more than one approach and minimizes the chance of side effects. Be honest about the amount of medicines you take and about any drug or alcohol use. Get pain medicine prescriptions from only one health care provider. Pain can be managed with many types of alternative treatments. Ask your health care provider to refer you to one or more specialists who can help you manage pain through: Physical or occupational therapy. Counseling (cognitive behavioral therapy). Good nutrition. Biofeedback. Massage. Meditation. Non-opioid medicine. Following a gentle exercise program. How to use opioid pain medicine Taking medicine Take your pain medicine exactly as told by your health care provider. Take it only when you need it. If your pain gets less severe, you may take less than your prescribed dose if your health care provider approves. If you are not having pain, do nottake pain medicine unless your health care provider tells you to take it. If your pain is severe, do nottry to treat it yourself by taking more pills than instructed on your prescription. Contact your health care provider for help. Write down the times when you take  your pain medicine. It is easy to become confused while on pain medicine. Writing the time can help you avoid overdose. Take other over-the-counter or prescription medicines only as told by your health care provider. Keeping yourself and others safe  While you are taking opioid pain medicine: Do not drive, use  machinery, or power tools. Do not sign legal documents. Do not drink alcohol. Do not take sleeping pills. Do not supervise children by yourself. Do not do activities that require climbing or being in high places. Do not go to a lake, river, ocean, spa, or swimming pool. Do not share your pain medicine with anyone. Keep pain medicine in a locked cabinet or in a secure area where pets and children cannot reach it. Stopping your use of opioids If you have been taking opioid medicine for more than a few weeks, you may need to slowly decrease (taper) how much you take until you stop completely. Tapering your use of opioids can decrease your risk of symptoms of withdrawal, such as: Pain and cramping in the abdomen. Nausea. Sweating. Sleepiness. Restlessness. Uncontrollable shaking (tremors). Cravings for the medicine. Do not attempt to taper your use of opioids on your own. Talk with your health care provider about how to do this. Your health care provider may prescribe a step-down schedule based on how much medicine you are taking and how long you have been taking it. Getting rid of leftover pills Do not save any leftover pills. Get rid of leftover pills safely by: Taking the medicine to a prescription take-back program. This is usually offered by the county or law enforcement. Bringing them to a pharmacy that has a drug disposal container. Flushing them down the toilet. Check the label or package insert of your medicine to see whether this is safe to do. Throwing them out in the trash. Check the label or package insert of your medicine to see whether this is safe to do. If it is safe to throw it out, remove the medicine from the original container, put it into a sealable bag or container, and mix it with used coffee grounds, food scraps, dirt, or cat litter before putting it in the trash. Follow these instructions at home: Activity Do exercises as told by your health care provider. Avoid  activities that make your pain worse. Return to your normal activities as told by your health care provider. Ask your health care provider what activities are safe for you. General instructions You may need to take these actions to prevent or treat constipation: Drink enough fluid to keep your urine pale yellow. Take over-the-counter or prescription medicines. Eat foods that are high in fiber, such as beans, whole grains, and fresh fruits and vegetables. Limit foods that are high in fat and processed sugars, such as fried or sweet foods. Keep all follow-up visits. This is important. Where to find support If you have been taking opioids for a long time, you may benefit from receiving support for quitting from a local support group or counselor. Ask your health care provider for a referral to these resources in your area. Where to find more information Centers for Disease Control and Prevention (CDC): FootballExhibition.com.br [place] Food and Drug Administration (FDA): PumpkinSearch.com.ee Get help right away if: You may have taken too much of an opioid (overdosed). Common symptoms of an overdose: Your breathing is slower or more shallow than normal. You have a very slow heartbeat (pulse). You have slurred speech. You have nausea and vomiting. Your pupils become  very small. You have other potential symptoms: You are very confused. You faint or feel like you will faint. You have cold, clammy skin. You have blue lips or fingernails. You have thoughts of harming yourself or harming others. These symptoms may represent a serious problem that is an emergency. Do not wait to see if the symptoms will go away. Get medical help right away. Call your local emergency services (911 in the [place] Do not drive yourself to the hospital.  If you ever feel like you may hurt yourself or others, or have thoughts about taking your own life, get help right away. Go to your nearest emergency department or: Call your local emergency  services (911 in the [place] Call the [facility] ((361)329-5087 in the [place] Call a suicide crisis helpline, such as the National Suicide Prevention Lifeline at (773)498-1932 or 988 in the [place] This is open 24 hours a day in the [place] Text the Crisis Text Line at 717-615-5591 (in the [place] Summary Opioid medicines can help you manage moderate to severe pain for a short period of time. A pain treatment plan is an agreement between you and your health care provider. Discuss the goals of your treatment, including how much pain you might expect to have and how you will manage the pain. If you think that you or someone else may have taken too much of an opioid, get medical help right away. This information is not intended to replace advice given to you by your health care provider. Make sure you discuss any questions you have with your health care provider. Document Revised: 08/29/2020 Document Reviewed: 05/16/2020 Elsevier Patient Education  2024 [employer]  Advanced directives: (Copy Requested) Please bring a copy of your health care power of attorney and living will to the office to be added to your chart at your convenience.  Next Medicare Annual Wellness Visit scheduled for next year: Yes  Preventive Care 23 Years and Older, Male  Preventive care refers to lifestyle choices and visits with your health care provider that can promote health and wellness. What does preventive care include? A yearly physical exam. This is also called an annual well check. Dental exams once or twice a year. Routine eye exams. Ask your health care provider how often you should have your eyes checked. Personal lifestyle choices, including: Daily care of your teeth and gums. Regular physical activity. Eating a healthy diet. Avoiding tobacco and drug use. Limiting alcohol use. Practicing safe sex. Taking low doses of aspirin every day. Taking vitamin and mineral supplements as recommended by  your health care provider. What happens during an annual well check? The services and screenings done by your health care provider during your annual well check will depend on your age, overall health, lifestyle risk factors, and family history of disease. Counseling  Your health care provider may ask you questions about your: Alcohol use. Tobacco use. Drug use. Emotional well-being. Home and relationship well-being. Sexual activity. Eating habits. History of falls. Memory and ability to understand (cognition). Work and work Astronomer. Screening  You may have the following tests or measurements: Height, weight, and BMI. Blood pressure. Lipid and cholesterol levels. These may be checked every 5 years, or more frequently if you are over 48 years old. Skin check. Lung cancer screening. You may have this screening every year starting at age 68 if you have a 30-pack-year history of smoking and currently smoke or have quit within the past 15 years. Fecal occult blood  test (FOBT) of the stool. You may have this test every year starting at age 63. Flexible sigmoidoscopy or colonoscopy. You may have a sigmoidoscopy every 5 years or a colonoscopy every 10 years starting at age 88. Prostate cancer screening. Recommendations will vary depending on your family history and other risks. Hepatitis C blood test. Hepatitis B blood test. Sexually transmitted disease (STD) testing. Diabetes screening. This is done by checking your blood sugar (glucose) after you have not eaten for a while (fasting). You may have this done every 1-3 years. Abdominal aortic aneurysm (AAA) screening. You may need this if you are a current or former smoker. Osteoporosis. You may be screened starting at age 23 if you are at high risk. Talk with your health care provider about your test results, treatment options, and if necessary, the need for more tests. Vaccines  Your health care provider may recommend certain vaccines,  such as: Influenza vaccine. This is recommended every year. Tetanus, diphtheria, and acellular pertussis (Tdap, Td) vaccine. You may need a Td booster every 10 years. Zoster vaccine. You may need this after age 90. Pneumococcal 13-valent conjugate (PCV13) vaccine. One dose is recommended after age 38. Pneumococcal polysaccharide (PPSV23) vaccine. One dose is recommended after age 72. Talk to your health care provider about which screenings and vaccines you need and how often you need them. This information is not intended to replace advice given to you by your health care provider. Make sure you discuss any questions you have with your health care provider. Document Released: 03/02/2015 Document Revised: 10/24/2015 Document Reviewed: 12/05/2014 Elsevier Interactive Patient Education  2017 [employer].  Fall Prevention in the Home Falls can cause injuries. They can happen to people of all ages. There are many things you can do to make your home safe and to help prevent falls. What can I do on the outside of my home? Regularly fix the edges of walkways and driveways and fix any cracks. Remove anything that might make you trip as you walk through a door, such as a raised step or threshold. Trim any bushes or trees on the path to your home. Use bright outdoor lighting. Clear any walking paths of anything that might make someone trip, such as rocks or tools. Regularly check to see if handrails are loose or broken. Make sure that both sides of any steps have handrails. Any raised decks and porches should have guardrails on the edges. Have any leaves, snow, or ice cleared regularly. Use sand or salt on walking paths during winter. Clean up any spills in your garage right away. This includes oil or grease spills. What can I do in the bathroom? Use night lights. Install grab bars by the toilet and in the tub and shower. Do not use towel bars as grab bars. Use non-skid mats or decals in the tub or  shower. If you need to sit down in the shower, use a plastic, non-slip stool. Keep the floor dry. Clean up any water that spills on the floor as soon as it happens. Remove soap buildup in the tub or shower regularly. Attach bath mats securely with double-sided non-slip rug tape. Do not have throw rugs and other things on the floor that can make you trip. What can I do in the bedroom? Use night lights. Make sure that you have a light by your bed that is easy to reach. Do not use any sheets or blankets that are too big for your bed. They should not hang  down onto the floor. Have a firm chair that has side arms. You can use this for support while you get dressed. Do not have throw rugs and other things on the floor that can make you trip. What can I do in the kitchen? Clean up any spills right away. Avoid walking on wet floors. Keep items that you use a lot in easy-to-reach places. If you need to reach something above you, use a strong step stool that has a grab bar. Keep electrical cords out of the way. Do not use floor polish or wax that makes floors slippery. If you must use wax, use non-skid floor wax. Do not have throw rugs and other things on the floor that can make you trip. What can I do with my stairs? Do not leave any items on the stairs. Make sure that there are handrails on both sides of the stairs and use them. Fix handrails that are broken or loose. Make sure that handrails are as long as the stairways. Check any carpeting to make sure that it is firmly attached to the stairs. Fix any carpet that is loose or worn. Avoid having throw rugs at the top or bottom of the stairs. If you do have throw rugs, attach them to the floor with carpet tape. Make sure that you have a light switch at the top of the stairs and the bottom of the stairs. If you do not have them, ask someone to add them for you. What else can I do to help prevent falls? Wear shoes that: Do not have high heels. Have  rubber bottoms. Are comfortable and fit you well. Are closed at the toe. Do not wear sandals. If you use a stepladder: Make sure that it is fully opened. Do not climb a closed stepladder. Make sure that both sides of the stepladder are locked into place. Ask someone to hold it for you, if possible. Clearly mark and make sure that you can see: Any grab bars or handrails. First and last steps. Where the edge of each step is. Use tools that help you move around (mobility aids) if they are needed. These include: Canes. Walkers. Scooters. Crutches. Turn on the lights when you go into a dark area. Replace any light bulbs as soon as they burn out. Set up your furniture so you have a clear path. Avoid moving your furniture around. If any of your floors are uneven, fix them. If there are any pets around you, be aware of where they are. Review your medicines with your doctor. Some medicines can make you feel dizzy. This can increase your chance of falling. Ask your doctor what other things that you can do to help prevent falls. This information is not intended to replace advice given to you by your health care provider. Make sure you discuss any questions you have with your health care provider. Document Released: 11/30/2008 Document Revised: 07/12/2015 Document Reviewed: 03/10/2014 Elsevier Interactive Patient Education  2017 [employer].

## 2022-09-12 NOTE — Progress Notes (Signed)
Chief Complaint  Patient presents with   Post-op Follow-up    Right knee pain s/p knee arthroscopy 09/09/22/ requests refill oxycodone/ pain increased yesterday    72 year old male had uncomplicated knee arthroscopy partial medial meniscectomy.  See dictation below  The patient says he had acute pain when he was not having any pain so he was concerned  His leg looks fine his wound looks good he has mild swelling in the knee he has some stiffness and pain with range of motion.  Nothing looks out of the ordinary there is no sign of blood clot  I refilled his medication to keep his regular appointment   Encounter Diagnosis  Name Primary?   S/P arthroscopy of right knee 09/09/22 Yes    Meds ordered this encounter  Medications   oxyCODONE-acetaminophen (PERCOCET) 5-325 MG tablet    Sig: Take 1 tablet by mouth every 4 (four) hours as needed for up to 5 days for severe pain.    Dispense:  30 tablet    Refill:  0     Knee arthroscopy dictation   Preop diagnosis torn medial meniscus osteoarthritis right knee with stress fracture/stress reaction  Postop diagnosis same  Procedure arthroscopy right knee partial medial meniscectomy and chondroplasty medial femoral condyle   Surgeon Romeo Apple   Operative findings   MEDIAL - meniscus posterior horn meniscus tear with flipped fragment into the medial gutter -articular surface grade 3 chondral changes medial femoral condyle   LATERAL - meniscus fraying of the free edges no tear -articular surface normal   PTF   -articular surface grade 2   NOTCH  -acl normal -pcl normal
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
## 2022-09-12 NOTE — Telephone Encounter (Signed)
I saw this message today, he is here now.

## 2022-09-18 ENCOUNTER — Ambulatory Visit (INDEPENDENT_AMBULATORY_CARE_PROVIDER_SITE_OTHER): Payer: Medicare Other | Admitting: Orthopedic Surgery

## 2022-09-18 DIAGNOSIS — Z4889 Encounter for other specified surgical aftercare: Secondary | ICD-10-CM

## 2022-09-18 DIAGNOSIS — Z9889 Other specified postprocedural states: Secondary | ICD-10-CM

## 2022-09-18 DIAGNOSIS — M1711 Unilateral primary osteoarthritis, right knee: Secondary | ICD-10-CM

## 2022-09-18 DIAGNOSIS — S83231A Complex tear of medial meniscus, current injury, right knee, initial encounter: Secondary | ICD-10-CM

## 2022-09-18 NOTE — Progress Notes (Signed)
Chief Complaint  Patient presents with   Follow-up    Recheck on right knee, DOS 09/09/22.   oxyCODONE-acetaminophen (PERCOCET) 5-325 MG tablet (Expired) Take 1 tablet by mouth every 4 (four) hours as needed for up to 5 days for severe pain. Dispense: 30 tablet, Refills: 0 ordered   Last visit 09/12/22 72 had uncomplicated knee arthroscopy partial medial meniscectomy.  See dictation below   The patient says he had acute pain when he was not having any pain so he was concerned   His leg looks fine his wound looks good he has mild swelling in the knee he has some stiffness and pain with range of motion.   Nothing looks out of the ordinary there is no sign of blood clot   I refilled his medication to keep his regular appointment.  Today: pod # 8  [name] has made extensive recovery and is doing much better.  He is resting much better.  He is taking only Tylenol for pain maybe 1 or 2 pain pills at night he was taking over the last couple of days  Sutures were removed portals look good no swelling walking with a cane minimal limp  Quadriceps exercises return in 3 weeks  Encounter Diagnoses  Name Primary?   S/P arthroscopy of right knee 09/09/22 Yes   Complex tear of medial meniscus of right knee as current injury, initial encounter    Unilateral primary osteoarthritis, right knee    Aftercare following surgery

## 2022-09-28 ENCOUNTER — Encounter: Payer: Self-pay | Admitting: Orthopedic Surgery

## 2022-09-29 ENCOUNTER — Other Ambulatory Visit: Payer: Self-pay | Admitting: Orthopedic Surgery

## 2022-09-29 ENCOUNTER — Ambulatory Visit (HOSPITAL_COMMUNITY)
Admission: RE | Admit: 2022-09-29 | Discharge: 2022-09-29 | Disposition: A | Discharge status: Home / Self Care | Payer: Medicare Other | Source: Ambulatory Visit | Attending: Orthopedic Surgery | Admitting: Orthopedic Surgery

## 2022-09-29 DIAGNOSIS — M7989 Other specified soft tissue disorders: Secondary | ICD-10-CM | POA: Insufficient documentation

## 2022-09-29 DIAGNOSIS — Z9889 Other specified postprocedural states: Secondary | ICD-10-CM | POA: Insufficient documentation
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
## 2022-10-07 ENCOUNTER — Ambulatory Visit (INDEPENDENT_AMBULATORY_CARE_PROVIDER_SITE_OTHER): Payer: Medicare Other | Admitting: Gastroenterology

## 2022-10-07 ENCOUNTER — Encounter: Payer: Self-pay | Admitting: Gastroenterology

## 2022-10-07 ENCOUNTER — Other Ambulatory Visit: Payer: Self-pay | Admitting: Family Medicine

## 2022-10-07 VITALS — BP 125/85 | HR 67 | Temp 97.9°F | Ht 74.0 in | Wt 225.2 lb

## 2022-10-07 DIAGNOSIS — K633 Ulcer of intestine: Secondary | ICD-10-CM

## 2022-10-07 DIAGNOSIS — K259 Gastric ulcer, unspecified as acute or chronic, without hemorrhage or perforation: Secondary | ICD-10-CM | POA: Insufficient documentation

## 2022-10-07 DIAGNOSIS — R0989 Other specified symptoms and signs involving the circulatory and respiratory systems: Secondary | ICD-10-CM | POA: Diagnosis not present

## 2022-10-07 NOTE — Patient Instructions (Signed)
It was good to catch up with you today. I will keep you in my thoughts as you navigate all of these life changes ahead.   Continue pantoprazole 40mg  twice daily before meals for at least 3 months, after that you can try to reduce to once daily. At some point in the next year, you can try coming off pantoprazole but if you have worsening throat clearing, cough, or any other GI symptoms, you may have to stay on it for now.  As discussed, we will plan on seeing you back as needed.   It was a pleasure to see you today.  I truly value your feedback, so please be on the lookout for a survey regarding your visit with me today. I appreciate your time in completing this!

## 2022-10-10 ENCOUNTER — Ambulatory Visit: Payer: Medicare Other | Admitting: Orthopedic Surgery

## 2022-10-10 ENCOUNTER — Encounter: Payer: Self-pay | Admitting: Orthopedic Surgery

## 2022-10-10 DIAGNOSIS — S83231A Complex tear of medial meniscus, current injury, right knee, initial encounter: Secondary | ICD-10-CM

## 2022-10-10 DIAGNOSIS — Z9889 Other specified postprocedural states: Secondary | ICD-10-CM

## 2022-10-10 DIAGNOSIS — M1711 Unilateral primary osteoarthritis, right knee: Secondary | ICD-10-CM

## 2022-10-10 NOTE — Progress Notes (Signed)
Chief Complaint  Patient presents with   Post-op Follow-up    Right knee 09/09/22 improving but still painful    Encounter Diagnoses  Name Primary?   S/P arthroscopy of right knee Yes   Complex tear of medial meniscus of right knee as current injury, initial encounter    Unilateral primary osteoarthritis, right knee     Status post arthroscopy partial medial meniscectomy has a stress reaction of the bone on the medial side he says his knee is better he did go to the ER for ultrasound of his leg to rule out DVT and he has a ruptured Baker's cyst causing some Swelling of recommended he use a heating pad for that  He regained his range of motion his knee looks good he is walking much better  Recommend follow-up as needed regarding his knee  He wants to have a left carpal tunnel release done at some point and have his left long finger checked as well he will schedule at his convenience

## 2022-10-15 ENCOUNTER — Telehealth: Payer: Self-pay | Admitting: Orthopedic Surgery

## 2022-10-15 DIAGNOSIS — M7989 Other specified soft tissue disorders: Secondary | ICD-10-CM

## 2022-10-15 DIAGNOSIS — Z9889 Other specified postprocedural states: Secondary | ICD-10-CM

## 2022-10-15 NOTE — Telephone Encounter (Signed)
[name] called and states he is swelling on his calf.   And it is not getting better.   He would like Amy to return his call.

## 2022-10-15 NOTE — Telephone Encounter (Signed)
I spoke to him he states again pain swelling in calf worse with activity

## 2022-10-16 ENCOUNTER — Other Ambulatory Visit: Payer: Self-pay

## 2022-10-16 ENCOUNTER — Encounter (HOSPITAL_COMMUNITY): Payer: Self-pay | Admitting: Emergency Medicine

## 2022-10-16 ENCOUNTER — Other Ambulatory Visit: Payer: Self-pay | Admitting: Family Medicine

## 2022-10-16 ENCOUNTER — Emergency Department (HOSPITAL_COMMUNITY)
Admission: EM | Admit: 2022-10-16 | Discharge: 2022-10-16 | Disposition: A | Discharge status: Home / Self Care | Payer: Medicare Other | Attending: Emergency Medicine | Admitting: Emergency Medicine

## 2022-10-16 ENCOUNTER — Emergency Department (HOSPITAL_COMMUNITY): Payer: Medicare Other

## 2022-10-16 DIAGNOSIS — W01198A Fall on same level from slipping, tripping and stumbling with subsequent striking against other object, initial encounter: Secondary | ICD-10-CM | POA: Diagnosis not present

## 2022-10-16 DIAGNOSIS — Z79899 Other long term (current) drug therapy: Secondary | ICD-10-CM | POA: Insufficient documentation

## 2022-10-16 DIAGNOSIS — S01311A Laceration without foreign body of right ear, initial encounter: Secondary | ICD-10-CM | POA: Diagnosis not present

## 2022-10-16 DIAGNOSIS — Z7982 Long term (current) use of aspirin: Secondary | ICD-10-CM | POA: Diagnosis not present

## 2022-10-16 DIAGNOSIS — I10 Essential (primary) hypertension: Secondary | ICD-10-CM | POA: Insufficient documentation

## 2022-10-16 DIAGNOSIS — J449 Chronic obstructive pulmonary disease, unspecified: Secondary | ICD-10-CM | POA: Insufficient documentation

## 2022-10-16 DIAGNOSIS — S0991XA Unspecified injury of ear, initial encounter: Secondary | ICD-10-CM | POA: Diagnosis present

## 2022-10-16 DIAGNOSIS — Y9301 Activity, walking, marching and hiking: Secondary | ICD-10-CM | POA: Diagnosis not present

## 2022-10-16 DIAGNOSIS — Z23 Encounter for immunization: Secondary | ICD-10-CM | POA: Diagnosis not present

## 2022-10-16 DIAGNOSIS — S0990XA Unspecified injury of head, initial encounter: Secondary | ICD-10-CM | POA: Diagnosis not present

## 2022-10-16 MED ORDER — POVIDONE-IODINE 10 % EX SOLN
CUTANEOUS | Status: AC
  Filled 2022-10-16: qty 14.8

## 2022-10-16 MED ORDER — LIDOCAINE HCL (PF) 2 % IJ SOLN
10.0000 mL | Freq: Once | INTRAMUSCULAR | Status: AC
  Administered 2022-10-16: 10 mL

## 2022-10-16 MED ORDER — LIDOCAINE HCL (PF) 2 % IJ SOLN
INTRAMUSCULAR | Status: AC
  Filled 2022-10-16: qty 10

## 2022-10-16 MED ORDER — TETANUS-DIPHTH-ACELL PERTUSSIS 5-2.5-18.5 LF-MCG/0.5 IM SUSY
0.5000 mL | PREFILLED_SYRINGE | Freq: Once | INTRAMUSCULAR | Status: AC
  Administered 2022-10-16: 0.5 mL via INTRAMUSCULAR
  Filled 2022-10-16: qty 0.5

## 2022-10-16 NOTE — ED Notes (Signed)
Patient transported to CT 

## 2022-10-16 NOTE — Discharge Instructions (Signed)
Apply antibiotic ointment such as bacitracin to your ear.  Use a loose dressing.  The stitches are absorbable.  Have your wound rechecked in 7 to 10 days.

## 2022-10-16 NOTE — ED Triage Notes (Addendum)
Pt to ed from home pov. Pt c/o right ear injury/pain that occurred when pt fell walking to the bathroom. Pt has noticeable laceration to right ear, bleeding controlled at this time. Denies LOC and blood thinners. Pt states "just a baby aspirin" once a day
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
## 2022-10-17 ENCOUNTER — Other Ambulatory Visit: Payer: Self-pay

## 2022-10-17 ENCOUNTER — Ambulatory Visit (HOSPITAL_COMMUNITY): Payer: Medicare Other

## 2022-10-17 DIAGNOSIS — M7989 Other specified soft tissue disorders: Secondary | ICD-10-CM | POA: Diagnosis not present

## 2022-10-17 DIAGNOSIS — R262 Difficulty in walking, not elsewhere classified: Secondary | ICD-10-CM | POA: Insufficient documentation

## 2022-10-17 DIAGNOSIS — M25561 Pain in right knee: Secondary | ICD-10-CM | POA: Diagnosis present

## 2022-10-17 DIAGNOSIS — M79661 Pain in right lower leg: Secondary | ICD-10-CM | POA: Diagnosis present

## 2022-10-17 DIAGNOSIS — Z9889 Other specified postprocedural states: Secondary | ICD-10-CM | POA: Diagnosis not present
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
## 2022-10-28 ENCOUNTER — Other Ambulatory Visit: Payer: Self-pay | Admitting: Thoracic Surgery (Cardiothoracic Vascular Surgery)

## 2022-10-28 DIAGNOSIS — I7121 Aneurysm of the ascending aorta, without rupture: Secondary | ICD-10-CM

## 2022-10-31 ENCOUNTER — Encounter (HOSPITAL_COMMUNITY): Payer: Medicare Other

## 2022-12-02 ENCOUNTER — Encounter: Payer: Self-pay | Admitting: Orthopaedic Surgery

## 2022-12-02 ENCOUNTER — Ambulatory Visit (INDEPENDENT_AMBULATORY_CARE_PROVIDER_SITE_OTHER): Payer: Medicare Other | Admitting: Orthopaedic Surgery

## 2022-12-02 VITALS — BP 134/82 | HR 62 | Ht 74.0 in | Wt 221.0 lb

## 2022-12-02 DIAGNOSIS — G8929 Other chronic pain: Secondary | ICD-10-CM

## 2022-12-02 DIAGNOSIS — M25511 Pain in right shoulder: Secondary | ICD-10-CM

## 2022-12-02 DIAGNOSIS — M25512 Pain in left shoulder: Secondary | ICD-10-CM | POA: Diagnosis not present

## 2022-12-02 MED ORDER — METHYLPREDNISOLONE ACETATE 40 MG/ML IJ SUSP
40.0000 mg | Freq: Once | INTRAMUSCULAR | Status: AC
Start: 2022-12-02 — End: 2022-12-02
  Administered 2022-12-02: 40 mg via INTRA_ARTICULAR

## 2022-12-02 NOTE — Progress Notes (Signed)
PROCEDURE NOTE:  The patient request injection, verbal consent was obtained.  The right shoulder was prepped appropriately after time out was performed.   Sterile technique was observed and injection of 1 cc of DepoMedrol 40mg  with several cc's of plain xylocaine. Anesthesia was provided by ethyl chloride and a 20-gauge needle was used to inject the shoulder area. A posterior approach was used.  The injection was tolerated well.  A band aid dressing was applied.  The patient was advised to apply ice later today and tomorrow to the injection sight as needed.   PROCEDURE NOTE:  The patient request injection, verbal consent was obtained.  The left shoulder was prepped appropriately after time out was performed.   Sterile technique was observed and injection of 1 cc of DepoMedrol 40mg  with several cc's of plain xylocaine. Anesthesia was provided by ethyl chloride and a 20-gauge needle was used to inject the shoulder area. A posterior approach was used.  The injection was tolerated well.  A band aid dressing was applied.  The patient was advised to apply ice later today and tomorrow to the injection sight as needed.   Encounter Diagnoses  Name Primary?   Chronic left shoulder pain Yes   Chronic right shoulder pain    Return prn.  Call if any problem.  Precautions discussed.  Electronically Signed Darreld Mclean, MD 10/15/20242:02 PM

## 2022-12-03 ENCOUNTER — Ambulatory Visit: Payer: Medicare Other | Admitting: Orthopaedic Surgery

## 2022-12-08 ENCOUNTER — Encounter: Payer: Self-pay | Admitting: Family Medicine

## 2022-12-10 ENCOUNTER — Other Ambulatory Visit: Payer: Self-pay | Admitting: Family Medicine

## 2022-12-10 DIAGNOSIS — Z2911 Encounter for prophylactic immunotherapy for respiratory syncytial virus (RSV): Secondary | ICD-10-CM

## 2022-12-27 ENCOUNTER — Other Ambulatory Visit: Payer: Self-pay | Admitting: Internal Medicine

## 2022-12-27 ENCOUNTER — Other Ambulatory Visit: Payer: Self-pay | Admitting: Family Medicine

## 2022-12-30 ENCOUNTER — Ambulatory Visit: Payer: Medicare Other | Admitting: Thoracic Surgery (Cardiothoracic Vascular Surgery)

## 2023-01-06 ENCOUNTER — Ambulatory Visit (INDEPENDENT_AMBULATORY_CARE_PROVIDER_SITE_OTHER): Payer: Medicare Other | Admitting: Thoracic Surgery (Cardiothoracic Vascular Surgery)

## 2023-01-06 ENCOUNTER — Inpatient Hospital Stay
Admission: RE | Admit: 2023-01-06 | Discharge: 2023-01-06 | Disposition: A | Discharge status: Home / Self Care | Payer: Medicare Other | Source: Ambulatory Visit | Attending: Thoracic Surgery (Cardiothoracic Vascular Surgery) | Admitting: Thoracic Surgery (Cardiothoracic Vascular Surgery)

## 2023-01-06 ENCOUNTER — Ambulatory Visit: Payer: Self-pay | Admitting: Family Medicine

## 2023-01-06 ENCOUNTER — Encounter: Payer: Self-pay | Admitting: Thoracic Surgery (Cardiothoracic Vascular Surgery)

## 2023-01-06 VITALS — BP 124/81 | HR 63 | Resp 18 | Ht 74.0 in | Wt 213.0 lb

## 2023-01-06 DIAGNOSIS — I7121 Aneurysm of the ascending aorta, without rupture: Secondary | ICD-10-CM

## 2023-01-06 MED ORDER — IOPAMIDOL (ISOVUE-370) INJECTION 76%
500.0000 mL | Freq: Once | INTRAVENOUS | Status: AC | PRN
  Administered 2023-01-06: 75 mL via INTRAVENOUS
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
## 2023-01-06 NOTE — Telephone Encounter (Signed)
  Chief Complaint: syncopal last evening Symptoms: none currently Frequency: 1 Pertinent Negatives: Patient denies CP, denies SOB, denies any s/s presently, Disposition: [] ED /[] Urgent Care (no appt availability in office) / [x] Appointment(In office/virtual)/ []  [place] [facility]/ [] Home Care/ [] Refused Recommended Disposition /[] [place] [facility]/ []  Follow-up with PCP Additional Notes: Pt calls with witnessed 30 second syncopal episode yesterday. States was standing up, felt dizzy, passed out.  Per wife pt was unconscious about 30 seconds. BP after syncope was 94/53.  Denies any s/s at this time. Good CMS x4/4. Denies vision changes. Denies CP. Denies cards hx. Per pt he does not have time to be seen in the ED today as he has medical appts this afternoon.  Appt sched for tomorrow at 1040am.  Advised to call back or go to the ED should this happen again. BP currently 124 systolic with normal PR.    Copied from CRM [id number]. Topic: Clinical - Pink Word Triage >> Jan 06, 2023  9:03 AM Deaijah H wrote: Reason for Triage: Blacked out/Low blood pressure Reason for Disposition  [1] Age > 50 years  AND [2] now alert and feels fine  Answer Assessment - Initial Assessment Questions 1. ONSET: "How long were you unconscious?" (minutes) "When did it happen?"     30 seconds 2. CONTENT: "What happened during period of unconsciousness?" (e.g., seizure activity)      Denies, wife was with 3. MENTAL STATUS: "Alert and oriented now?" (oriented x 3 = name, month, location)      AO x4/4 4. TRIGGER: "What do you think caused the fainting?" "What were you doing just before you fainted?"  (e.g., exercise, sudden standing up, prolonged standing)     Sudden standing up 5. RECURRENT SYMPTOM: "Have you ever passed out before?" If Yes, ask: "When was the last time?" and "What happened that time?"      3 months ago 6. INJURY: "Did you sustain any injury during the fall?"      denies 7. CARDIAC SYMPTOMS:  "Have you had any of the following symptoms: chest pain, difficulty breathing, palpitations?"     denies 8. NEUROLOGIC SYMPTOMS: "Have you had any of the following symptoms: headache, numbness, vertigo, weakness?"     denies 9. GI SYMPTOMS: "Have you had any of the following symptoms: abdomen pain, vomiting, diarrhea, blood in stools?"     Denies, colonoscopy recently, found ulcers 10. OTHER SYMPTOMS: "Do you have any other symptoms?"       Took BP, was hypotensive, initial BP reading was 94/53, PR 69  Protocols used: Fainting-A-AH

## 2023-01-07 ENCOUNTER — Ambulatory Visit (INDEPENDENT_AMBULATORY_CARE_PROVIDER_SITE_OTHER): Payer: Medicare Other | Admitting: Family Medicine

## 2023-01-07 ENCOUNTER — Encounter: Payer: Self-pay | Admitting: Family Medicine

## 2023-01-07 VITALS — BP 118/58 | HR 76 | Temp 97.7°F | Ht 74.0 in | Wt 214.0 lb

## 2023-01-07 DIAGNOSIS — R55 Syncope and collapse: Secondary | ICD-10-CM | POA: Insufficient documentation

## 2023-01-07 NOTE — Patient Instructions (Signed)
Stop chlorthalidone.  Labs today.  Follow up in 1 month.

## 2023-01-07 NOTE — Progress Notes (Signed)
Subjective:  Patient ID: [name], male    DOB: 04-15-1950  Age: 72 y.o. MRN: [id number]  CC:   Chief Complaint  Patient presents with   Loss of Consciousness    Monday and 1 to 2 months ago reocurrence , low bp on Monday 90/50, 105/50 hr 60    HPI:  72 year old male with the below mentioned medical problems presents for evaluation of the above.  Patient has had fairly recent syncopal episode that happened at home.  Occurred after going from sitting to standing.  He also had an episode where he felt like he was going to pass out after using the restroom.  Blood pressures have been low at times.  He is currently on chlorthalidone and losartan.  Denies chest pain.  Denies shortness of breath.  He does note poor appetite as of late.  Has a lot of ongoing stress.  He has had weight loss as well.  Weight currently 214.  He was 230 pounds earlier in the year in June.  Patient Active Problem List   Diagnosis Date Noted   Syncope 01/07/2023   Gastric ulcer 10/07/2022   S/P arthroscopy of right knee 09/09/22 09/11/2022   Primary osteoarthritis of right knee 09/09/2022   CAD (coronary artery disease) 07/21/2022   Chronic pain disorder 08/12/2021   Prediabetes 08/12/2021   Sleep apnea 08/12/2021   Multiple thyroid nodules 08/12/2021   Anxiety 06/13/2021   History of stroke 06/12/2021   Hyperlipidemia 06/12/2021   Thoracic aortic aneurysm without rupture (HCC) 11/16/2018   Severe recurrent major depression without psychotic features (HCC) 06/20/2016   Benzodiazepine abuse in remission (HCC) 03/28/2016   Alcohol abuse    Essential hypertension 06/07/2008    Social Hx   Social History   Socioeconomic History   Marital status: Married    Spouse name: Clydie Braun   Number of children: 3   Years of education: Not on file   Highest education level: Not on file  Occupational History   Not on file  Tobacco Use   Smoking status: Former    Current packs/day: 0.00    Average  packs/day: 3.0 packs/day for 35.0 years (105.0 ttl pk-yrs)    Types: Cigarettes    Start date: 02/17/1957    Quit date: 02/18/1992    Years since quitting: 30.9   Smokeless tobacco: Never   Tobacco comments:    no smoking in 24 yrs,   Vaping Use   Vaping status: Never Used  Substance and Sexual Activity   Alcohol use: Yes    Alcohol/week: 4.0 standard drinks of alcohol    Types: 4 Shots of liquor per week    Comment: No ETOH since 02/2016; previously 2-4 drinks vodka 2 times week   Drug use: No   Sexual activity: Not Currently  Other Topics Concern   Not on file  Social History Narrative   3 biological children and 2 step children.   Social Determinants of Health   Financial Resource Strain: Low Risk  (09/12/2022)   Overall Financial Resource Strain (CARDIA)    Difficulty of Paying Living Expenses: Not hard at all  Food Insecurity: No Food Insecurity (09/12/2022)   Hunger Vital Sign    Worried About Running Out of Food in the Last Year: Never true    Ran Out of Food in the Last Year: Never true  Transportation Needs: No Transportation Needs (09/12/2022)   PRAPARE - Administrator, Civil Service (Medical): No  Lack of Transportation (Non-Medical): No  Physical Activity: Sufficiently Active (09/12/2022)   Exercise Vital Sign    Days of Exercise per Week: 7 days    Minutes of Exercise per Session: 30 min  Stress: Stress Concern Present (09/12/2022)   [employer] of Occupational Health - Occupational Stress Questionnaire    Feeling of Stress : To some extent  Social Connections: Moderately Isolated (09/12/2022)   Social Connection and Isolation Panel [NHANES]    Frequency of Communication with Friends and Family: More than three times a week    Frequency of Social Gatherings with Friends and Family: More than three times a week    Attends Religious Services: Never    Database administrator or Organizations: No    Attends Engineer, structural: Never     Marital Status: Married    Review of Systems Per HPI  Objective:  BP (!) 118/58   Pulse 76   Temp 97.7 F (36.5 C)   Ht 6\' 2"  (1.88 m)   Wt 214 lb (97.1 kg)   SpO2 97%   BMI 27.48 kg/m      01/07/2023   10:34 AM 01/06/2023    1:25 PM 01/06/2023    1:22 PM  BP/Weight  Systolic BP 118 124   Diastolic BP 58 81   Wt. (Lbs) 214 213 221  BMI 27.48 kg/m2 27.35 kg/m2 28.37 kg/m2    Physical Exam Constitutional:      General: He is not in acute distress.    Appearance: Normal appearance.  HENT:     Head: Normocephalic and atraumatic.  Cardiovascular:     Rate and Rhythm: Normal rate and regular rhythm.  Pulmonary:     Effort: Pulmonary effort is normal.     Breath sounds: Normal breath sounds. No wheezing, rhonchi or rales.  Neurological:     Mental Status: He is alert.     Lab Results  Component Value Date   WBC 7.2 07/21/2022   HGB 15.0 07/21/2022   HCT 42.5 07/21/2022   PLT 231 07/21/2022   GLUCOSE 90 07/21/2022   CHOL 194 07/21/2022   TRIG 186 (H) 07/21/2022   HDL 68 07/21/2022   LDLCALC 95 07/21/2022   ALT 27 07/21/2022   AST 23 07/21/2022   NA 142 07/21/2022   K 4.1 07/21/2022   CL 101 07/21/2022   CREATININE 0.77 07/21/2022   BUN 22 07/21/2022   CO2 27 07/21/2022   TSH 1.518 06/12/2016   INR 1.10 03/01/2015   HGBA1C 5.8 (H) 07/21/2022     Assessment & Plan:   Problem List Items Addressed This Visit       Other   Syncope - Primary    Suspect that this is vasovagal.  Labs today.  Discontinuing chlorthalidone.      Relevant Orders   CBC   CMP14+EGFR    Follow-up:  Return in about 1 month (around 02/06/2023) for HTN follow up.  Everlene Other DO [facility]

## 2023-01-07 NOTE — Assessment & Plan Note (Signed)
Suspect that this is vasovagal.  Labs today.  Discontinuing chlorthalidone.

## 2023-01-08 LAB — CBC
Hematocrit: 45.8 % (ref 37.5–51.0)
Hemoglobin: 15.3 g/dL (ref 13.0–17.7)
MCH: 33.5 pg — ABNORMAL HIGH (ref 26.6–33.0)
MCHC: 33.4 g/dL (ref 31.5–35.7)
MCV: 100 fL — ABNORMAL HIGH (ref 79–97)
Platelets: 275 10*3/uL (ref 150–450)
RBC: 4.57 x10E6/uL (ref 4.14–5.80)
RDW: 11.6 % (ref 11.6–15.4)
WBC: 7.7 10*3/uL (ref 3.4–10.8)

## 2023-01-08 LAB — CMP14+EGFR
ALT: 18 [IU]/L (ref 0–44)
AST: 19 [IU]/L (ref 0–40)
Albumin: 4.8 g/dL (ref 3.8–4.8)
Alkaline Phosphatase: 92 [IU]/L (ref 44–121)
BUN/Creatinine Ratio: 21 (ref 10–24)
BUN: 27 mg/dL (ref 8–27)
Bilirubin Total: 0.7 mg/dL (ref 0.0–1.2)
CO2: 23 mmol/L (ref 20–29)
Calcium: 9.7 mg/dL (ref 8.6–10.2)
Chloride: 99 mmol/L (ref 96–106)
Creatinine, Ser: 1.27 mg/dL (ref 0.76–1.27)
Globulin, Total: 2.3 g/dL (ref 1.5–4.5)
Glucose: 124 mg/dL — ABNORMAL HIGH (ref 70–99)
Potassium: 4.3 mmol/L (ref 3.5–5.2)
Sodium: 141 mmol/L (ref 134–144)
Total Protein: 7.1 g/dL (ref 6.0–8.5)
eGFR: 59 mL/min/{1.73_m2} — ABNORMAL LOW (ref >59)

## 2023-01-20 ENCOUNTER — Ambulatory Visit (INDEPENDENT_AMBULATORY_CARE_PROVIDER_SITE_OTHER): Payer: Medicare Other | Admitting: Family Medicine

## 2023-01-20 VITALS — BP 117/65 | HR 55 | Temp 97.3°F | Ht 74.0 in | Wt 222.0 lb

## 2023-01-20 DIAGNOSIS — I10 Essential (primary) hypertension: Secondary | ICD-10-CM

## 2023-01-20 MED ORDER — LOSARTAN POTASSIUM 100 MG PO TABS: 50.0000 mg | ORAL_TABLET | Freq: Every day | ORAL | Status: DC

## 2023-01-20 NOTE — Patient Instructions (Signed)
Decrease losartan to 1/2 (50 mg daily).  Follow up in 6 weeks.

## 2023-01-20 NOTE — Assessment & Plan Note (Signed)
Given low BPs at home, decreasing losartan to 50 mg daily.

## 2023-01-20 NOTE — Progress Notes (Signed)
Subjective:  Patient ID: [name], male    DOB: Jun 29, 1950  Age: 72 y.o. MRN: [id number]  CC:   Chief Complaint  Patient presents with   syncopy follow up    Hypotension follow up    HPI:  72 year old male presents for follow up regarding BP.  No further syncope.  Has had several home blood pressure readings that have been in the 90s systolic and below.  Chlorthalidone has been stopped.  He is currently on losartan 100 mg daily.  Will discuss this today.  Patient reports significant stress at home.  Patient Active Problem List   Diagnosis Date Noted   Syncope 01/07/2023   Gastric ulcer 10/07/2022   S/P arthroscopy of right knee 09/09/22 09/11/2022   Primary osteoarthritis of right knee 09/09/2022   CAD (coronary artery disease) 07/21/2022   Chronic pain disorder 08/12/2021   Prediabetes 08/12/2021   Sleep apnea 08/12/2021   Multiple thyroid nodules 08/12/2021   Anxiety 06/13/2021   History of stroke 06/12/2021   Hyperlipidemia 06/12/2021   Thoracic aortic aneurysm without rupture (HCC) 11/16/2018   Severe recurrent major depression without psychotic features (HCC) 06/20/2016   Benzodiazepine abuse in remission (HCC) 03/28/2016   Alcohol abuse    Essential hypertension 06/07/2008    Social Hx   Social History   Socioeconomic History   Marital status: Married    Spouse name: [name]   Number of children: 3   Years of education: Not on file   Highest education level: Not on file  Occupational History   Not on file  Tobacco Use   Smoking status: Former    Current packs/day: 0.00    Average packs/day: 3.0 packs/day for 35.0 years (105.0 ttl pk-yrs)    Types: Cigarettes    Start date: 02/17/1957    Quit date: 02/18/1992    Years since quitting: 30.9   Smokeless tobacco: Never   Tobacco comments:    no smoking in 24 yrs,   Vaping Use   Vaping status: Never Used  Substance and Sexual Activity   Alcohol use: Yes    Alcohol/week: 4.0 standard drinks of  alcohol    Types: 4 Shots of liquor per week    Comment: No ETOH since 02/2016; previously 2-4 drinks vodka 2 times week   Drug use: No   Sexual activity: Not Currently  Other Topics Concern   Not on file  Social History Narrative   3 biological children and 2 step children.   Social Determinants of Health   Financial Resource Strain: Low Risk  (09/12/2022)   Overall Financial Resource Strain (CARDIA)    Difficulty of Paying Living Expenses: Not hard at all  Food Insecurity: No Food Insecurity (09/12/2022)   Hunger Vital Sign    Worried About Running Out of Food in the Last Year: Never true    Ran Out of Food in the Last Year: Never true  Transportation Needs: No Transportation Needs (09/12/2022)   PRAPARE - Administrator, Civil Service (Medical): No    Lack of Transportation (Non-Medical): No  Physical Activity: Sufficiently Active (09/12/2022)   Exercise Vital Sign    Days of Exercise per Week: 7 days    Minutes of Exercise per Session: 30 min  Stress: Stress Concern Present (09/12/2022)   [employer] of Occupational Health - Occupational Stress Questionnaire    Feeling of Stress : To some extent  Social Connections: Moderately Isolated (09/12/2022)   Social Connection and  Isolation Panel [NHANES]    Frequency of Communication with Friends and Family: More than three times a week    Frequency of Social Gatherings with Friends and Family: More than three times a week    Attends Religious Services: Never    Diplomatic Services operational officer: No    Attends Engineer, structural: Never    Marital Status: Married    Review of Systems Per HPI  Objective:  BP 117/65   Pulse (!) 55   Temp (!) 97.3 F (36.3 C)   Ht 6\' 2"  (1.88 m)   Wt 222 lb (100.7 kg)   SpO2 96%   BMI 28.50 kg/m      01/20/2023   10:55 AM 01/20/2023   10:54 AM 01/07/2023   10:34 AM  BP/Weight  Systolic BP 117 148 118  Diastolic BP 65 75 58  Wt. (Lbs)  222 214  BMI  28.5  kg/m2 27.48 kg/m2    Physical Exam Vitals and nursing note reviewed.  Constitutional:      General: He is not in acute distress. HENT:     Head: Normocephalic and atraumatic.  Cardiovascular:     Rate and Rhythm: Normal rate and regular rhythm.  Pulmonary:     Effort: Pulmonary effort is normal.     Breath sounds: Normal breath sounds.  Neurological:     Mental Status: He is alert.     Lab Results  Component Value Date   WBC 7.7 01/07/2023   HGB 15.3 01/07/2023   HCT 45.8 01/07/2023   PLT 275 01/07/2023   GLUCOSE 124 (H) 01/07/2023   CHOL 194 07/21/2022   TRIG 186 (H) 07/21/2022   HDL 68 07/21/2022   LDLCALC 95 07/21/2022   ALT 18 01/07/2023   AST 19 01/07/2023   NA 141 01/07/2023   K 4.3 01/07/2023   CL 99 01/07/2023   CREATININE 1.27 01/07/2023   BUN 27 01/07/2023   CO2 23 01/07/2023   TSH 1.518 06/12/2016   INR 1.10 03/01/2015   HGBA1C 5.8 (H) 07/21/2022     Assessment & Plan:   Problem List Items Addressed This Visit       Cardiovascular and Mediastinum   Essential hypertension - Primary    Given low BPs at home, decreasing losartan to 50 mg daily.      Relevant Medications   losartan (COZAAR) 100 MG tablet    Meds ordered this encounter  Medications   losartan (COZAAR) 100 MG tablet    Sig: Take 0.5 tablets (50 mg total) by mouth daily.    Follow-up:  Return in about 6 weeks (around 03/03/2023) for HTN follow up.  Everlene Other DO [facility]

## 2023-02-08 ENCOUNTER — Encounter: Payer: Self-pay | Admitting: Family Medicine

## 2023-03-03 ENCOUNTER — Ambulatory Visit: Payer: Medicare Other | Admitting: Family Medicine

## 2023-03-03 VITALS — BP 128/79 | HR 63 | Temp 98.0°F | Ht 74.0 in | Wt 219.0 lb

## 2023-03-03 DIAGNOSIS — F332 Major depressive disorder, recurrent severe without psychotic features: Secondary | ICD-10-CM | POA: Diagnosis not present

## 2023-03-03 DIAGNOSIS — E785 Hyperlipidemia, unspecified: Secondary | ICD-10-CM

## 2023-03-03 DIAGNOSIS — I10 Essential (primary) hypertension: Secondary | ICD-10-CM

## 2023-03-03 DIAGNOSIS — I7121 Aneurysm of the ascending aorta, without rupture: Secondary | ICD-10-CM | POA: Diagnosis not present

## 2023-03-03 NOTE — Progress Notes (Signed)
 Subjective:  Patient ID: [name], male    DOB: 24-Jul-1950  Age: 73 y.o. MRN: [id number]  CC:   Chief Complaint  Patient presents with   Hypertension    HPI:  73 year old male with the below mentioned medical problems presents for follow-up.  Patient's blood pressure is stable.  No recurrence of syncope.  He is on losartan  50 mg daily.  Blood pressure readings at home are stable overall.  Patient is followed by cardiothoracic surgery regarding thoracic aortic aneurysm.  Stable.  Patient reports significant stress as his wife's health is declining.  He is seeing his therapist/counselor today.  Patient Active Problem List   Diagnosis Date Noted   Gastric ulcer 10/07/2022   S/P arthroscopy of right knee 09/09/22 09/11/2022   Primary osteoarthritis of right knee 09/09/2022   CAD (coronary artery disease) 07/21/2022   Chronic pain disorder 08/12/2021   Prediabetes 08/12/2021   Sleep apnea 08/12/2021   Multiple thyroid  nodules 08/12/2021   Anxiety 06/13/2021   History of stroke 06/12/2021   Hyperlipidemia 06/12/2021   Thoracic aortic aneurysm without rupture (HCC) 11/16/2018   Severe recurrent major depression without psychotic features (HCC) 06/20/2016   Benzodiazepine abuse in remission (HCC) 03/28/2016   Essential hypertension 06/07/2008    Social Hx   Social History   Socioeconomic History   Marital status: Married    Spouse name: Darice   Number of children: 3   Years of education: Not on file   Highest education level: Not on file  Occupational History   Not on file  Tobacco Use   Smoking status: Former    Current packs/day: 0.00    Average packs/day: 3.0 packs/day for 35.0 years (105.0 ttl pk-yrs)    Types: Cigarettes    Start date: 02/17/1957    Quit date: 02/18/1992    Years since quitting: 31.0   Smokeless tobacco: Never   Tobacco comments:    no smoking in 24 yrs,   Vaping Use   Vaping status: Never Used  Substance and Sexual Activity    Alcohol use: Yes    Alcohol/week: 4.0 standard drinks of alcohol    Types: 4 Shots of liquor per week    Comment: No ETOH since 02/2016; previously 2-4 drinks vodka 2 times week   Drug use: No   Sexual activity: Not Currently  Other Topics Concern   Not on file  Social History Narrative   3 biological children and 2 step children.   Social Drivers of Corporate Investment Banker Strain: Low Risk  (09/12/2022)   Overall Financial Resource Strain (CARDIA)    Difficulty of Paying Living Expenses: Not hard at all  Food Insecurity: No Food Insecurity (09/12/2022)   Hunger Vital Sign    Worried About Running Out of Food in the Last Year: Never true    Ran Out of Food in the Last Year: Never true  Transportation Needs: No Transportation Needs (09/12/2022)   PRAPARE - Administrator, Civil Service (Medical): No    Lack of Transportation (Non-Medical): No  Physical Activity: Sufficiently Active (09/12/2022)   Exercise Vital Sign    Days of Exercise per Week: 7 days    Minutes of Exercise per Session: 30 min  Stress: Stress Concern Present (09/12/2022)   [employer] of Occupational Health - Occupational Stress Questionnaire    Feeling of Stress : To some extent  Social Connections: Moderately Isolated (09/12/2022)   Social Connection and Isolation Panel [NHANES]  Frequency of Communication with Friends and Family: More than three times a week    Frequency of Social Gatherings with Friends and Family: More than three times a week    Attends Religious Services: Never    Database Administrator or Organizations: No    Attends Engineer, Structural: Never    Marital Status: Married    Review of Systems Per HPI  Objective:  BP 128/79   Pulse 63   Temp 98 F (36.7 C)   Ht 6' 2 (1.88 m)   Wt 219 lb (99.3 kg)   SpO2 99%   BMI 28.12 kg/m      03/03/2023    1:21 PM 03/03/2023    1:19 PM 01/20/2023   10:55 AM  BP/Weight  Systolic BP 128 143 117  Diastolic BP  79 92 65  Wt. (Lbs)  219   BMI  28.12 kg/m2     Physical Exam Vitals and nursing note reviewed.  Constitutional:      General: He is not in acute distress.    Appearance: Normal appearance.  HENT:     Head: Normocephalic and atraumatic.  Eyes:     General:        Right eye: No discharge.        Left eye: No discharge.     Conjunctiva/sclera: Conjunctivae normal.  Cardiovascular:     Rate and Rhythm: Normal rate and regular rhythm.  Pulmonary:     Effort: Pulmonary effort is normal.     Breath sounds: Normal breath sounds.  Neurological:     Mental Status: He is alert.  Psychiatric:     Comments: Depressed mood.     Lab Results  Component Value Date   WBC 7.7 01/07/2023   HGB 15.3 01/07/2023   HCT 45.8 01/07/2023   PLT 275 01/07/2023   GLUCOSE 124 (H) 01/07/2023   CHOL 194 07/21/2022   TRIG 186 (H) 07/21/2022   HDL 68 07/21/2022   LDLCALC 95 07/21/2022   ALT 18 01/07/2023   AST 19 01/07/2023   NA 141 01/07/2023   K 4.3 01/07/2023   CL 99 01/07/2023   CREATININE 1.27 01/07/2023   BUN 27 01/07/2023   CO2 23 01/07/2023   TSH 1.518 06/12/2016   INR 1.10 03/01/2015   HGBA1C 5.8 (H) 07/21/2022     Assessment & Plan:   Problem List Items Addressed This Visit       Cardiovascular and Mediastinum   Thoracic aortic aneurysm without rupture (HCC)   Stable.  Annual follow-up with cardiothoracic surgery.      Essential hypertension - Primary   Stable.  Continue losartan .        Other   Severe recurrent major depression without psychotic features [facility])   Wife's health is declining which is very stressful.  He is seeing his therapist today.  Continue current medications.      Hyperlipidemia   Lipids reviewed today.  Cannot tolerate statins.  Continue Zetia .  Will discuss addition of Repatha at follow-up.  Labs at follow-up as well.       Follow-up: 6 months  Kore Madlock Bluford DO [facility]

## 2023-03-03 NOTE — Patient Instructions (Signed)
 Continue your medications.  Follow up in 6 months.  Take care  Dr. Adriana Simas

## 2023-03-03 NOTE — Assessment & Plan Note (Signed)
 Wife's health is declining which is very stressful.  He is seeing his therapist today.  Continue current medications.

## 2023-03-03 NOTE — Assessment & Plan Note (Signed)
 Stable.  Annual follow-up with cardiothoracic surgery.

## 2023-03-03 NOTE — Assessment & Plan Note (Signed)
 Stable. Continue losartan

## 2023-03-03 NOTE — Assessment & Plan Note (Signed)
 Lipids reviewed today.  Cannot tolerate statins.  Continue Zetia.  Will discuss addition of Repatha at follow-up.  Labs at follow-up as well.

## 2023-04-22 ENCOUNTER — Encounter: Payer: Self-pay | Admitting: Orthopedic Surgery

## 2023-04-22 ENCOUNTER — Encounter: Payer: Self-pay | Admitting: Family Medicine

## 2023-05-11 ENCOUNTER — Encounter: Payer: Self-pay | Admitting: Orthopedic Surgery

## 2023-06-21 ENCOUNTER — Encounter: Payer: Self-pay | Admitting: Family Medicine

## 2023-06-22 ENCOUNTER — Other Ambulatory Visit: Payer: Self-pay | Admitting: Family Medicine

## 2023-06-22 MED ORDER — LOSARTAN POTASSIUM 50 MG PO TABS: 50.0000 mg | ORAL_TABLET | Freq: Every day | ORAL | 3 refills | Status: AC

## 2023-07-03 ENCOUNTER — Ambulatory Visit: Admitting: Orthopedic Surgery

## 2023-07-03 ENCOUNTER — Encounter: Payer: Self-pay | Admitting: Orthopedic Surgery

## 2023-07-03 VITALS — Wt 225.6 lb

## 2023-07-03 DIAGNOSIS — M65322 Trigger finger, left index finger: Secondary | ICD-10-CM | POA: Diagnosis not present

## 2023-07-03 DIAGNOSIS — M6530 Trigger finger, unspecified finger: Secondary | ICD-10-CM

## 2023-07-03 DIAGNOSIS — M65341 Trigger finger, right ring finger: Secondary | ICD-10-CM | POA: Diagnosis not present

## 2023-07-03 MED ORDER — METHYLPREDNISOLONE ACETATE 40 MG/ML IJ SUSP
40.0000 mg | Freq: Once | INTRAMUSCULAR | Status: AC
Start: 2023-07-03 — End: 2023-07-03
  Administered 2023-07-03: 40 mg via INTRA_ARTICULAR

## 2023-07-03 NOTE — Progress Notes (Signed)
 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  Pain over the palm right hand ring finger  Examination shows tenderness over the A1 pulley of each digit as stated above  Neurovascular exam is normal without numbness or tingling color capillary refill normal decreased range of motion throughout both hands related to probable degenerative changes  We have a left index finger trigger I have a PIP joint contracture left long finger Right ring finger trigger  2 injections were given  Trigger finger injection  Diagnosis left index finger Procedure injection A1 pulley Medications lidocaine  1% 1 mL and Depo-Medrol  40 mg 1 mL Skin prep alcohol and ethyl chloride Verbal consent was obtained Timeout confirmed the injection site  After cleaning the skin with alcohol and anesthetizing the skin with ethyl chloride the A1 pulley was palpated and the injection was performed without complication  Trigger finger injection  Diagnosis right ring finger Procedure injection A1 pulley Medications lidocaine  1% 1 mL and Depo-Medrol  40 mg 1 mL Skin prep alcohol and ethyl chloride Verbal consent was obtained Timeout confirmed the injection site  After cleaning the skin with alcohol and anesthetizing the skin with ethyl chloride the A1 pulley was palpated and the injection was performed without complication

## 2023-07-14 ENCOUNTER — Encounter: Payer: Self-pay | Admitting: Orthopedic Surgery

## 2023-07-23 ENCOUNTER — Other Ambulatory Visit: Payer: Self-pay | Admitting: Family Medicine

## 2023-07-31 ENCOUNTER — Encounter: Payer: Self-pay | Admitting: Orthopedic Surgery

## 2023-08-31 ENCOUNTER — Encounter: Payer: Self-pay | Admitting: Family Medicine

## 2023-08-31 ENCOUNTER — Ambulatory Visit: Payer: Medicare Other | Admitting: Family Medicine

## 2023-08-31 ENCOUNTER — Other Ambulatory Visit: Payer: Self-pay

## 2023-08-31 VITALS — BP 144/83 | HR 60 | Temp 97.5°F | Ht 74.0 in | Wt 223.0 lb

## 2023-08-31 DIAGNOSIS — D7589 Other specified diseases of blood and blood-forming organs: Secondary | ICD-10-CM | POA: Diagnosis not present

## 2023-08-31 DIAGNOSIS — R7303 Prediabetes: Secondary | ICD-10-CM

## 2023-08-31 DIAGNOSIS — Z8042 Family history of malignant neoplasm of prostate: Secondary | ICD-10-CM

## 2023-08-31 DIAGNOSIS — F419 Anxiety disorder, unspecified: Secondary | ICD-10-CM

## 2023-08-31 DIAGNOSIS — I10 Essential (primary) hypertension: Secondary | ICD-10-CM

## 2023-08-31 DIAGNOSIS — E785 Hyperlipidemia, unspecified: Secondary | ICD-10-CM | POA: Diagnosis not present

## 2023-08-31 DIAGNOSIS — R972 Elevated prostate specific antigen [PSA]: Secondary | ICD-10-CM

## 2023-08-31 DIAGNOSIS — Z8711 Personal history of peptic ulcer disease: Secondary | ICD-10-CM | POA: Insufficient documentation

## 2023-08-31 NOTE — Progress Notes (Signed)
 Subjective:  Patient ID: [name], male    DOB: 31-Dec-1950  Age: 73 y.o. MRN: [id number]  CC:   Chief Complaint  Patient presents with   follow up and blood work     Psa requested due to fam hx of CA    HPI:  73 year old male with the below mentioned medical problems presents for follow up.  Patient states that he was recently started on Seroquel which is helping sleeping. Struggling with wife's declining health but has an upcoming trip planned for respite.   BP mildly elevated here today. Tolerating Losartan . Has has history of syncope.  Needs labs and is requesting PSA (primarily due to family history of prostate cancer).  Overall, he feels that he is doing well considering his age and circumstances.  Patient Active Problem List   Diagnosis Date Noted   History of gastric ulcer 08/31/2023   S/P arthroscopy of right knee 09/09/22 09/11/2022   Primary osteoarthritis of right knee 09/09/2022   CAD (coronary artery disease) 07/21/2022   Prediabetes 08/12/2021   Sleep apnea 08/12/2021   Multiple thyroid  nodules 08/12/2021   Anxiety 06/13/2021   History of stroke 06/12/2021   Hyperlipidemia 06/12/2021   Thoracic aortic aneurysm without rupture (HCC) 11/16/2018   Severe recurrent major depression without psychotic features (HCC) 06/20/2016   Benzodiazepine abuse in remission (HCC) 03/28/2016   Essential hypertension 06/07/2008    Social Hx   Social History   Socioeconomic History   Marital status: Married    Spouse name: Darice   Number of children: 3   Years of education: Not on file   Highest education level: Not on file  Occupational History   Not on file  Tobacco Use   Smoking status: Former    Current packs/day: 0.00    Average packs/day: 3.0 packs/day for 35.0 years (105.0 ttl pk-yrs)    Types: Cigarettes    Start date: 02/17/1957    Quit date: 02/18/1992    Years since quitting: 31.5   Smokeless tobacco: Never   Tobacco comments:    no smoking in  24 yrs,   Vaping Use   Vaping status: Never Used  Substance and Sexual Activity   Alcohol use: Yes    Alcohol/week: 4.0 standard drinks of alcohol    Types: 4 Shots of liquor per week    Comment: No ETOH since 02/2016; previously 2-4 drinks vodka 2 times week   Drug use: No   Sexual activity: Not Currently  Other Topics Concern   Not on file  Social History Narrative   3 biological children and 2 step children.   Social Drivers of Corporate investment banker Strain: Low Risk  (09/12/2022)   Overall Financial Resource Strain (CARDIA)    Difficulty of Paying Living Expenses: Not hard at all  Food Insecurity: No Food Insecurity (09/12/2022)   Hunger Vital Sign    Worried About Running Out of Food in the Last Year: Never true    Ran Out of Food in the Last Year: Never true  Transportation Needs: No Transportation Needs (09/12/2022)   PRAPARE - Administrator, Civil Service (Medical): No    Lack of Transportation (Non-Medical): No  Physical Activity: Sufficiently Active (09/12/2022)   Exercise Vital Sign    Days of Exercise per Week: 7 days    Minutes of Exercise per Session: 30 min  Stress: Stress Concern Present (09/12/2022)   [employer] of Occupational Health - Occupational Stress  Questionnaire    Feeling of Stress : To some extent  Social Connections: Moderately Isolated (09/12/2022)   Social Connection and Isolation Panel    Frequency of Communication with Friends and Family: More than three times a week    Frequency of Social Gatherings with Friends and Family: More than three times a week    Attends Religious Services: Never    Database administrator or Organizations: No    Attends Banker Meetings: Never    Marital Status: Married    Review of Systems  Respiratory: Negative.    Cardiovascular: Negative.    Objective:  BP (!) 144/83   Pulse 60   Temp (!) 97.5 F (36.4 C)   Ht 6' 2 (1.88 m)   Wt 223 lb (101.2 kg)   SpO2 98%   BMI  28.63 kg/m      08/31/2023    2:24 PM 08/31/2023    1:59 PM 07/03/2023   11:08 AM  BP/Weight  Systolic BP 144 151   Diastolic BP 83 90   Wt. (Lbs)  223 225.6  BMI  28.63 kg/m2 28.97 kg/m2    Physical Exam Vitals and nursing note reviewed.  Constitutional:      General: He is not in acute distress.    Appearance: Normal appearance.  HENT:     Head: Normocephalic and atraumatic.  Eyes:     General:        Right eye: No discharge.        Left eye: No discharge.     Conjunctiva/sclera: Conjunctivae normal.  Cardiovascular:     Rate and Rhythm: Normal rate and regular rhythm.  Pulmonary:     Effort: Pulmonary effort is normal.     Breath sounds: Normal breath sounds.  Neurological:     Mental Status: He is alert.  Psychiatric:        Mood and Affect: Mood normal.        Behavior: Behavior normal.    Lab Results  Component Value Date   WBC 7.7 01/07/2023   HGB 15.3 01/07/2023   HCT 45.8 01/07/2023   PLT 275 01/07/2023   GLUCOSE 124 (H) 01/07/2023   CHOL 194 07/21/2022   TRIG 186 (H) 07/21/2022   HDL 68 07/21/2022   LDLCALC 95 07/21/2022   ALT 18 01/07/2023   AST 19 01/07/2023   NA 141 01/07/2023   K 4.3 01/07/2023   CL 99 01/07/2023   CREATININE 1.27 01/07/2023   BUN 27 01/07/2023   CO2 23 01/07/2023   TSH 1.518 06/12/2016   INR 1.10 03/01/2015   HGBA1C 5.8 (H) 07/21/2022     Assessment & Plan:  Essential hypertension Assessment & Plan: Mildly elevated here today. Given history of syncope, no increase in therapy today. Continue Losartan .  Orders: -     Microalbumin / creatinine urine ratio  Hyperlipidemia, unspecified hyperlipidemia type -     Lipid panel  Prediabetes -     CMP14+EGFR -     Hemoglobin A1c  Macrocytosis -     CBC  Family history of prostate cancer  Elevated PSA -     PSA  Anxiety Assessment & Plan: Doing okay at this time. Glad to hear he has upcoming respite.      Follow-up:  6 months  Philopater Mucha Bluford DO [facility]  Family Medicine

## 2023-08-31 NOTE — Patient Instructions (Signed)
 Labs today.  Follow up in 6 months.

## 2023-08-31 NOTE — Assessment & Plan Note (Signed)
 Doing okay at this time. Glad to hear he has upcoming respite.

## 2023-08-31 NOTE — Assessment & Plan Note (Signed)
 Mildly elevated here today. Given history of syncope, no increase in therapy today. Continue Losartan .

## 2023-09-01 ENCOUNTER — Ambulatory Visit: Payer: Self-pay | Admitting: Family Medicine

## 2023-09-01 LAB — CBC
Hematocrit: 45.1 % (ref 37.5–51.0)
Hemoglobin: 15.6 g/dL (ref 13.0–17.7)
MCH: 34.1 pg — ABNORMAL HIGH (ref 26.6–33.0)
MCHC: 34.6 g/dL (ref 31.5–35.7)
MCV: 99 fL — ABNORMAL HIGH (ref 79–97)
Platelets: 222 x10E3/uL (ref 150–450)
RBC: 4.57 x10E6/uL (ref 4.14–5.80)
RDW: 11.9 % (ref 11.6–15.4)
WBC: 6.5 x10E3/uL (ref 3.4–10.8)

## 2023-09-01 LAB — CMP14+EGFR
ALT: 23 IU/L (ref 0–44)
AST: 19 IU/L (ref 0–40)
Albumin: 4.7 g/dL (ref 3.8–4.8)
Alkaline Phosphatase: 75 IU/L (ref 44–121)
BUN/Creatinine Ratio: 16 (ref 10–24)
BUN: 14 mg/dL (ref 8–27)
Bilirubin Total: 0.9 mg/dL (ref 0.0–1.2)
CO2: 21 mmol/L (ref 20–29)
Calcium: 9.7 mg/dL (ref 8.6–10.2)
Chloride: 102 mmol/L (ref 96–106)
Creatinine, Ser: 0.87 mg/dL (ref 0.76–1.27)
Globulin, Total: 2.1 g/dL (ref 1.5–4.5)
Glucose: 100 mg/dL — ABNORMAL HIGH (ref 70–99)
Potassium: 4.2 mmol/L (ref 3.5–5.2)
Sodium: 141 mmol/L (ref 134–144)
Total Protein: 6.8 g/dL (ref 6.0–8.5)
eGFR: 90 mL/min/1.73 (ref >59)

## 2023-09-01 LAB — LIPID PANEL
Chol/HDL Ratio: 2.3 ratio (ref 0.0–5.0)
Cholesterol, Total: 187 mg/dL (ref 100–199)
HDL: 82 mg/dL (ref >39)
LDL Chol Calc (NIH): 83 mg/dL (ref 0–99)
Triglycerides: 128 mg/dL (ref 0–149)
VLDL Cholesterol Cal: 22 mg/dL (ref 5–40)

## 2023-09-01 LAB — MICROALBUMIN / CREATININE URINE RATIO
Creatinine, Urine: 58.4 mg/dL
Microalb/Creat Ratio: <5 mg/g{creat} (ref 0–29)
Microalbumin, Urine: <3 ug/mL

## 2023-09-01 LAB — HEMOGLOBIN A1C
Est. average glucose Bld gHb Est-mCnc: 108 mg/dL
Hgb A1c MFr Bld: 5.4 % (ref 4.8–5.6)

## 2023-09-01 LAB — PSA: Prostate Specific Ag, Serum: 3.5 ng/mL (ref 0.0–4.0)

## 2023-09-14 ENCOUNTER — Emergency Department (HOSPITAL_COMMUNITY)
Admission: EM | Admit: 2023-09-14 | Discharge: 2023-09-14 | Disposition: A | Discharge status: Home / Self Care | Source: Ambulatory Visit | Attending: Emergency Medicine | Admitting: Emergency Medicine

## 2023-09-14 ENCOUNTER — Ambulatory Visit: Payer: Self-pay | Admitting: *Deleted

## 2023-09-14 ENCOUNTER — Emergency Department (HOSPITAL_COMMUNITY)

## 2023-09-14 DIAGNOSIS — Z7982 Long term (current) use of aspirin: Secondary | ICD-10-CM | POA: Insufficient documentation

## 2023-09-14 DIAGNOSIS — M79671 Pain in right foot: Secondary | ICD-10-CM | POA: Diagnosis present

## 2023-09-14 DIAGNOSIS — M7989 Other specified soft tissue disorders: Secondary | ICD-10-CM | POA: Diagnosis not present

## 2023-09-14 DIAGNOSIS — Z8673 Personal history of transient ischemic attack (TIA), and cerebral infarction without residual deficits: Secondary | ICD-10-CM | POA: Diagnosis not present

## 2023-09-14 LAB — CBC WITH DIFFERENTIAL/PLATELET
Abs Immature Granulocytes: 0.03 K/uL (ref 0.00–0.07)
Basophils Absolute: 0.1 K/uL (ref 0.0–0.1)
Basophils Relative: 1 %
Eosinophils Absolute: 0.1 K/uL (ref 0.0–0.5)
Eosinophils Relative: 1 %
HCT: 44.6 % (ref 39.0–52.0)
Hemoglobin: 15.7 g/dL (ref 13.0–17.0)
Immature Granulocytes: 1 %
Lymphocytes Relative: 19 %
Lymphs Abs: 1.2 K/uL (ref 0.7–4.0)
MCH: 35.1 pg — ABNORMAL HIGH (ref 26.0–34.0)
MCHC: 35.2 g/dL (ref 30.0–36.0)
MCV: 99.8 fL (ref 80.0–100.0)
Monocytes Absolute: 0.6 K/uL (ref 0.1–1.0)
Monocytes Relative: 9 %
Neutro Abs: 4.3 K/uL (ref 1.7–7.7)
Neutrophils Relative %: 69 %
Platelets: 152 K/uL (ref 150–400)
RBC: 4.47 MIL/uL (ref 4.22–5.81)
RDW: 11.8 % (ref 11.5–15.5)
WBC: 6.3 K/uL (ref 4.0–10.5)
nRBC: 0 % (ref 0.0–0.2)

## 2023-09-14 LAB — COMPREHENSIVE METABOLIC PANEL WITH GFR
ALT: 26 U/L (ref 0–44)
AST: 31 U/L (ref 15–41)
Albumin: 4.2 g/dL (ref 3.5–5.0)
Alkaline Phosphatase: 67 U/L (ref 38–126)
Anion gap: 10 (ref 5–15)
BUN: 17 mg/dL (ref 8–23)
CO2: 23 mmol/L (ref 22–32)
Calcium: 9.3 mg/dL (ref 8.9–10.3)
Chloride: 106 mmol/L (ref 98–111)
Creatinine, Ser: 0.88 mg/dL (ref 0.61–1.24)
GFR, Estimated: >60 mL/min (ref >60)
Glucose, Bld: 106 mg/dL — ABNORMAL HIGH (ref 70–99)
Potassium: 5 mmol/L (ref 3.5–5.1)
Sodium: 139 mmol/L (ref 135–145)
Total Bilirubin: 1.6 mg/dL — ABNORMAL HIGH (ref 0.0–1.2)
Total Protein: 7.3 g/dL (ref 6.5–8.1)

## 2023-09-14 NOTE — ED Notes (Signed)
 Pt stated that his left arm now as a dull ache that is new. EKG done, edp made aware

## 2023-09-14 NOTE — ED Triage Notes (Addendum)
 Pain to RT calf that started last night, reports some numbness and swelling to RT foot that started this morning.  Pt concerned for blood clot. Ambulatory with no difficulty. Pt has good pedal pulses.

## 2023-09-14 NOTE — Telephone Encounter (Signed)
 FYI Only or Action Required?: FYI only for provider.  Patient was last seen in primary care on 08/31/2023 by Cook, Jayce G, DO.  Called Nurse Triage reporting Leg Swelling.  Symptoms began today.  Interventions attempted: Nothing.  Symptoms are: gradually worsening.  Triage Disposition: Go to ED Now (or PCP Triage)  Patient/caregiver understands and will follow disposition?: Yes             Copied from CRM [id number]. Topic: Clinical - Red Word Triage >> Sep 14, 2023 11:17 AM Kevelyn M wrote: Red Word that prompted transfer to Nurse Triage: Woke up this morning and right lower leg swollen, and right foot is numb and swollen. Reason for Disposition  Patient sounds very sick or weak to the triager  Answer Assessment - Initial Assessment Questions Recommended ED due to sx  per PCP Dr. Bluford after notifying [facility] patient wants to be seen in office      1. ONSET: When did the swelling start? (e.g., minutes, hours, days)     Last night  2. LOCATION: What part of the leg is swollen?  Are both legs swollen or just one leg?     Right foot and up to knee 3. SEVERITY: How bad is the swelling? (e.g., localized; mild, moderate, severe)     Bigger than other leg 4. REDNESS: Is there redness or signs of infection?     no 5. PAIN: Is the swelling painful to touch? If Yes, ask: How painful is it?   (Scale 1-10; mild, moderate or severe)     Yes pain can walk on leg, foot numb 6. FEVER: Do you have a fever? If Yes, ask: What is it, how was it measured, and when did it start?      na 7. CAUSE: What do you think is causing the leg swelling?     Not sure  8. MEDICAL HISTORY: Do you have a history of blood clots (e.g., DVT), cancer, heart failure, kidney disease, or liver failure?     See hx  9. RECURRENT SYMPTOM: Have you had leg swelling before? If Yes, ask: When was the last time? What happened that time?     Na  10. OTHER SYMPTOMS: Do you have any other  symptoms? (e.g., chest pain, difficulty breathing)      Numbness right foot, swelling right leg and calf denies chest pain no fever no difficulty breathing  11. PREGNANCY: Is there any chance you are pregnant? When was your last menstrual period?       na  Protocols used: Leg Swelling and Edema-A-AH

## 2023-09-14 NOTE — Discharge Instructions (Signed)
 Keep foot elevated is much as possible over the next week.  Follow-up with your family doctor next week

## 2023-09-14 NOTE — ED Provider Notes (Signed)
 [place] [facility] AT [facility] Provider Note   CSN: [id number] Arrival date & time: 09/14/23  1217     Patient presents with: Foot Swelling   [name] is a 73 y.o. male.  {Add pertinent medical, surgical, social history, OB history to YEP:67052} Patient with a history of a stroke.  Patient complains of swelling to the right ankle and foot and is concerned of a DVT.   Foot Pain       Prior to Admission medications   Medication Sig Start Date End Date Taking? Authorizing Provider  acetaminophen  (TYLENOL ) 500 MG tablet Take 500-1,000 mg by mouth every 6 (six) hours as needed for moderate pain.    [provider]  aspirin  81 MG EC tablet Take 1 tablet (81 mg total) by mouth daily. Swallow whole. 06/12/21   Cook, Jayce G, DO  ezetimibe (ZETIA) 10 MG tablet TAKE ONE TABLET BY MOUTH ONCE DAILY 07/23/23   Cook, Jayce G, DO  lamoTRIgine  (LAMICTAL ) 200 MG tablet Take 200 mg by mouth 2 (two) times daily.  08/12/17   [provider]  losartan  (COZAAR ) 50 MG tablet Take 1 tablet (50 mg total) by mouth daily. 06/22/23   Cook, Jayce G, DO  mirtazapine  (REMERON ) 45 MG tablet Take 45 mg by mouth at bedtime. 10/06/17   [provider]  QUEtiapine (SEROQUEL) 25 MG tablet Take 25 mg by mouth at bedtime.    [provider]    Allergies: Aricept  [donepezil  hcl], Bee venom, Namenda  [memantine  hcl], Quinine derivatives, Statins, Divalproex sodium, and Oxcarbazepine    Review of Systems  Updated Vital Signs BP (!) 148/88   Pulse 61   Temp 98.5 F (36.9 C) (Oral)   Resp 16   Ht 6' 1 (1.854 m)   Wt 99.8 kg   SpO2 98%   BMI 29.03 kg/m   Physical Exam  (all labs ordered are listed, but only abnormal results are displayed) Labs Reviewed  CBC WITH DIFFERENTIAL/PLATELET - Abnormal; Notable for the following components:      Result Value   MCH 35.1 (*)    All other components within normal limits  COMPREHENSIVE METABOLIC  PANEL WITH GFR - Abnormal; Notable for the following components:   Glucose, Bld 106 (*)    Total Bilirubin 1.6 (*)    All other components within normal limits    EKG: None  Radiology: US  Venous Img Lower Unilateral Right Result Date: 09/14/2023 CLINICAL DATA:  swelling right lower leg EXAM: RIGHT LOWER EXTREMITY VENOUS DOPPLER ULTRASOUND TECHNIQUE: Gray-scale sonography with graded compression, as well as color Doppler and duplex ultrasound were performed to evaluate the lower extremity deep venous systems from the level of the common femoral vein and including the common femoral, femoral, profunda femoral, popliteal and calf veins including the posterior tibial, peroneal and gastrocnemius veins when visible. The superficial great saphenous vein was also interrogated. Spectral Doppler was utilized to evaluate flow at rest and with distal augmentation maneuvers in the common femoral, femoral and popliteal veins. COMPARISON:  September 29, 2022 FINDINGS: Contralateral Common Femoral Vein: Respiratory phasicity is normal and symmetric with the symptomatic side. No evidence of thrombus. Normal compressibility. Common Femoral Vein: No evidence of thrombus. Normal compressibility, respiratory phasicity and response to augmentation. Saphenofemoral Junction: No evidence of thrombus. Normal compressibility and flow on color Doppler imaging. Profunda Femoral Vein: No evidence of thrombus. Normal compressibility and flow on color Doppler imaging. Femoral Vein: No evidence of thrombus. Normal compressibility, respiratory  phasicity and response to augmentation. Popliteal Vein: No evidence of thrombus. Normal compressibility, respiratory phasicity and response to augmentation. Calf Veins: No evidence of thrombus. Normal compressibility and flow on color Doppler imaging. Superficial Great Saphenous Vein: No evidence of thrombus. Normal compressibility. Other Findings:  None. IMPRESSION: Negative for deep venous thrombosis  in the right leg. Electronically Signed   By: Rogelia Myers M.D.   On: 09/14/2023 15:40    {Document cardiac monitor, telemetry assessment procedure when appropriate:32947} Procedures   Medications Ordered in the ED - No data to display Ultrasound negative for DVT.   {Click here for ABCD2, HEART and other calculators REFRESH Note before signing:1}                              Medical Decision Making Amount and/or Complexity of Data Reviewed Labs: ordered.  Patient with peripheral edema in the right leg.  He will be discharged home and told to keep elevated and follow-up with his PCP.  Ultrasound did not show any DVT  {Document critical care time when appropriate  Document review of labs and clinical decision tools ie CHADS2VASC2, etc  Document your independent review of radiology images and any outside records  Document your discussion with family members, caretakers and with consultants  Document social determinants of health affecting pt's care  Document your decision making why or why not admission, treatments were needed:32947:::1}   Final diagnoses:  Right foot pain    ED Discharge Orders     None

## 2023-09-18 ENCOUNTER — Ambulatory Visit: Payer: Medicare Other

## 2023-09-18 ENCOUNTER — Ambulatory Visit: Admitting: Family Medicine

## 2023-09-18 VITALS — BP 122/77 | HR 67 | Temp 98.1°F | Ht 73.0 in | Wt 223.4 lb

## 2023-09-18 DIAGNOSIS — R600 Localized edema: Secondary | ICD-10-CM | POA: Diagnosis not present

## 2023-09-18 NOTE — Patient Instructions (Signed)
 Elevate.  Keep a close eye.  Let me know if you need anything.

## 2023-09-20 DIAGNOSIS — R600 Localized edema: Secondary | ICD-10-CM | POA: Insufficient documentation

## 2023-09-20 NOTE — Assessment & Plan Note (Addendum)
 Mild on exam.  Note for the ER, US  results, and lab results reviewed today. US  negative for DVT. Advised close monitoring and elevation.

## 2023-09-20 NOTE — Progress Notes (Signed)
 Subjective:  Patient ID: [name], male    DOB: 1950/10/22  Age: 73 y.o. MRN: [id number]  CC:  ED follow up   HPI:  73 year old male presents for ED follow up.  Developed R lower leg edema on 7/28. Called and was sent to the ER for evaluation for possible DVT. US  negative.   Still having some swelling but it is improved. He is elevating.  Patient Active Problem List   Diagnosis Date Noted   Lower extremity edema 09/20/2023   History of gastric ulcer 08/31/2023   S/P arthroscopy of right knee 09/09/22 09/11/2022   Primary osteoarthritis of right knee 09/09/2022   CAD (coronary artery disease) 07/21/2022   Prediabetes 08/12/2021   Sleep apnea 08/12/2021   Multiple thyroid  nodules 08/12/2021   Anxiety 06/13/2021   History of stroke 06/12/2021   Hyperlipidemia 06/12/2021   Thoracic aortic aneurysm without rupture (HCC) 11/16/2018   Severe recurrent major depression without psychotic features (HCC) 06/20/2016   Benzodiazepine abuse in remission (HCC) 03/28/2016   Essential hypertension 06/07/2008    Social Hx   Social History   Socioeconomic History   Marital status: Married    Spouse name: Darice   Number of children: 3   Years of education: Not on file   Highest education level: Not on file  Occupational History   Not on file  Tobacco Use   Smoking status: Former    Current packs/day: 0.00    Average packs/day: 3.0 packs/day for 35.0 years (105.0 ttl pk-yrs)    Types: Cigarettes    Start date: 02/17/1957    Quit date: 02/18/1992    Years since quitting: 31.6   Smokeless tobacco: Never   Tobacco comments:    no smoking in 24 yrs,   Vaping Use   Vaping status: Never Used  Substance and Sexual Activity   Alcohol use: Yes    Alcohol/week: 4.0 standard drinks of alcohol    Types: 4 Shots of liquor per week    Comment: No ETOH since 02/2016; previously 2-4 drinks vodka 2 times week   Drug use: No   Sexual activity: Not Currently  Other Topics Concern    Not on file  Social History Narrative   3 biological children and 2 step children.   Social Drivers of Corporate investment banker Strain: Low Risk  (09/12/2022)   Overall Financial Resource Strain (CARDIA)    Difficulty of Paying Living Expenses: Not hard at all  Food Insecurity: No Food Insecurity (09/12/2022)   Hunger Vital Sign    Worried About Running Out of Food in the Last Year: Never true    Ran Out of Food in the Last Year: Never true  Transportation Needs: No Transportation Needs (09/12/2022)   PRAPARE - Administrator, Civil Service (Medical): No    Lack of Transportation (Non-Medical): No  Physical Activity: Sufficiently Active (09/12/2022)   Exercise Vital Sign    Days of Exercise per Week: 7 days    Minutes of Exercise per Session: 30 min  Stress: Stress Concern Present (09/12/2022)   [employer] of Occupational Health - Occupational Stress Questionnaire    Feeling of Stress : To some extent  Social Connections: Moderately Isolated (09/12/2022)   Social Connection and Isolation Panel    Frequency of Communication with Friends and Family: More than three times a week    Frequency of Social Gatherings with Friends and Family: More than three times a week  Attends Religious Services: Never    Active Member of Clubs or Organizations: No    Attends Banker Meetings: Never    Marital Status: Married    Review of Systems Per HPI  Objective:  BP 122/77   Pulse 67   Temp 98.1 F (36.7 C)   Ht 6' 1 (1.854 m)   Wt 223 lb 6.4 oz (101.3 kg)   SpO2 97%   BMI 29.47 kg/m      09/18/2023    9:53 AM 09/14/2023    4:09 PM 09/14/2023    2:00 PM  BP/Weight  Systolic BP 122 140 148  Diastolic BP 77 101 88  Wt. (Lbs) 223.4    BMI 29.47 kg/m2      Physical Exam Constitutional:      General: He is not in acute distress.    Appearance: Normal appearance.  HENT:     Head: Normocephalic and atraumatic.  Cardiovascular:     Comments: Mild  right lower extremity edema. Pulmonary:     Effort: Pulmonary effort is normal. No respiratory distress.  Neurological:     Mental Status: He is alert.     Lab Results  Component Value Date   WBC 6.3 09/14/2023   HGB 15.7 09/14/2023   HCT 44.6 09/14/2023   PLT 152 09/14/2023   GLUCOSE 106 (H) 09/14/2023   CHOL 187 08/31/2023   TRIG 128 08/31/2023   HDL 82 08/31/2023   LDLCALC 83 08/31/2023   ALT 26 09/14/2023   AST 31 09/14/2023   NA 139 09/14/2023   K 5.0 09/14/2023   CL 106 09/14/2023   CREATININE 0.88 09/14/2023   BUN 17 09/14/2023   CO2 23 09/14/2023   TSH 1.518 06/12/2016   INR 1.10 03/01/2015   HGBA1C 5.4 08/31/2023     Assessment & Plan:  Lower extremity edema Assessment & Plan: Mild on exam.  Note for the ER, US  results, and lab results reviewed today. US  negative for DVT. Advised close monitoring and elevation.     Follow-up:  As previously scheduled.  Jacqulyn Ahle DO [facility] Family Medicine

## 2023-10-09 ENCOUNTER — Encounter: Payer: Self-pay | Admitting: Radiology

## 2023-10-23 ENCOUNTER — Ambulatory Visit (INDEPENDENT_AMBULATORY_CARE_PROVIDER_SITE_OTHER)

## 2023-10-23 VITALS — Ht 73.0 in | Wt 223.0 lb

## 2023-10-23 DIAGNOSIS — Z0000 Encounter for general adult medical examination without abnormal findings: Secondary | ICD-10-CM | POA: Diagnosis not present
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
## 2023-10-23 NOTE — Patient Instructions (Addendum)
 [name],  Thank you for taking the time for your Medicare Wellness Visit. I appreciate your continued commitment to your health goals. Please review the care plan we discussed, and feel free to reach out if I can assist you further.  Medicare recommends these wellness visits once per year to help you and your care team stay ahead of potential health issues. These visits are designed to focus on prevention, allowing your provider to concentrate on managing your acute and chronic conditions during your regular appointments.  Please note that Annual Wellness Visits do not include a physical exam. Some assessments may be limited, especially if the visit was conducted virtually. If needed, we may recommend a separate in-person follow-up with your provider.  Ongoing Care Seeing your primary care provider every 3 to 6 months helps us  monitor your health and provide consistent, personalized care.   Referrals If a referral was made during today's visit and you haven't received any updates within two weeks, please contact the referred provider directly to check on the status.  Recommended Screenings:  Health Maintenance  Topic Date Due   Zoster (Shingles) Vaccine (1 of 2) Never done   Pneumococcal Vaccine for age over 39 (2 of 2 - PCV) 10/30/2013   Flu Shot  09/18/2023   COVID-19 Vaccine (6 - 2025-26 season) 10/19/2023   Medicare Annual Wellness Visit  10/22/2024   Colon Cancer Screening  08/14/2032   DTaP/Tdap/Td vaccine (2 - Td or Tdap) 10/15/2032   HPV Vaccine  Aged Out   Meningitis B Vaccine  Aged Out   Hepatitis C Screening  Discontinued       10/23/2023   11:58 AM  Advanced Directives  Does Patient Have a Medical Advance Directive? No  Would patient like information on creating a medical advance directive? Yes (MAU/Ambulatory/Procedural Areas - Information given)   Advance Care Planning is important because it: Ensures you receive medical care that aligns with your values,  goals, and preferences. Provides guidance to your family and loved ones, reducing the emotional burden of decision-making during critical moments.  Information on Advanced Care Planning can be found at [place]  Secretary of [facility] Advance Health Care Directives Advance Health Care Directives ([email or web address]   Vision: Annual vision screenings are recommended for early detection of glaucoma, cataracts, and diabetic retinopathy. These exams can also reveal signs of chronic conditions such as diabetes and high blood pressure.  Dental: Annual dental screenings help detect early signs of oral cancer, gum disease, and other conditions linked to overall health, including heart disease and diabetes.  Please see the attached documents for additional preventive care recommendations.

## 2023-11-12 ENCOUNTER — Encounter: Payer: Self-pay | Admitting: Family Medicine

## 2023-11-16 ENCOUNTER — Other Ambulatory Visit: Payer: Self-pay | Admitting: Family Medicine

## 2023-11-16 ENCOUNTER — Encounter: Payer: Self-pay | Admitting: Family Medicine

## 2023-11-16 MED ORDER — HYDROCHLOROTHIAZIDE 12.5 MG PO CAPS: 12.5000 mg | ORAL_CAPSULE | Freq: Every day | ORAL | 1 refills | Status: AC

## 2023-11-24 ENCOUNTER — Other Ambulatory Visit: Payer: Self-pay | Admitting: Family Medicine

## 2023-11-30 ENCOUNTER — Other Ambulatory Visit: Payer: Self-pay | Admitting: Thoracic Surgery (Cardiothoracic Vascular Surgery)

## 2023-11-30 DIAGNOSIS — I7121 Aneurysm of the ascending aorta, without rupture: Secondary | ICD-10-CM

## 2023-12-04 ENCOUNTER — Encounter: Payer: Self-pay | Admitting: Orthopedic Surgery

## 2023-12-04 ENCOUNTER — Ambulatory Visit (INDEPENDENT_AMBULATORY_CARE_PROVIDER_SITE_OTHER): Admitting: Orthopedic Surgery

## 2023-12-04 DIAGNOSIS — M65332 Trigger finger, left middle finger: Secondary | ICD-10-CM | POA: Diagnosis not present
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
## 2023-12-04 NOTE — Progress Notes (Signed)
    12/04/2023   Chief Complaint  Patient presents with   Hand Problem    Trigger finger left middle discuss surgery     No diagnosis found.  [facility] you [facility] ? [facility] [facility]  DOI/DOS/ Date:    Worse

## 2023-12-07 ENCOUNTER — Other Ambulatory Visit: Payer: Self-pay | Admitting: Thoracic Surgery (Cardiothoracic Vascular Surgery)

## 2023-12-07 DIAGNOSIS — I7121 Aneurysm of the ascending aorta, without rupture: Secondary | ICD-10-CM

## 2023-12-08 ENCOUNTER — Encounter: Payer: Self-pay | Admitting: Orthopedic Surgery

## 2023-12-09 ENCOUNTER — Other Ambulatory Visit: Payer: Self-pay | Admitting: Orthopedic Surgery

## 2023-12-09 ENCOUNTER — Telehealth: Payer: Self-pay | Admitting: Radiology

## 2023-12-09 DIAGNOSIS — Z01818 Encounter for other preprocedural examination: Secondary | ICD-10-CM

## 2023-12-09 DIAGNOSIS — M65332 Trigger finger, left middle finger: Secondary | ICD-10-CM

## 2023-12-09 NOTE — Telephone Encounter (Signed)
-----   Message from Taft Minerva sent at 12/09/2023  1:16 PM EDT ----- Regarding: RE: Local case needs to be done in Minor Room Dont schedule I ll have to talk to the patient ----- Message ----- From: Jenean Greig ORN, RT Sent: 12/09/2023   1:11 PM EDT To: Taft FORBES Minerva, MD Subject: FW: Local case needs to be done in Minor Room   ----- Message ----- From: Gwendloyn Suzen BIRCH Sent: 12/09/2023  12:36 PM EDT To: Elveria VEAR Salt; Larwence Tu W Sanjna Haskew, RT Subject: Local case needs to be done in Minor Room      Hi Virna Livengood,  I checked with Montie Seltzer and she said a straight local case would need to be done in the Minor Room. Just let us  know if he can do it in the Minor Room.  Thank you,  Luke FALCON.

## 2023-12-10 ENCOUNTER — Emergency Department (HOSPITAL_COMMUNITY)

## 2023-12-10 ENCOUNTER — Inpatient Hospital Stay (HOSPITAL_COMMUNITY)
Admission: EM | Admit: 2023-12-10 | Discharge: 2023-12-17 | DRG: 551 | Disposition: A | Discharge status: Home Health | Attending: Internal Medicine | Admitting: Internal Medicine

## 2023-12-10 ENCOUNTER — Encounter (HOSPITAL_COMMUNITY): Payer: Self-pay

## 2023-12-10 ENCOUNTER — Other Ambulatory Visit: Payer: Self-pay

## 2023-12-10 DIAGNOSIS — Z823 Family history of stroke: Secondary | ICD-10-CM

## 2023-12-10 DIAGNOSIS — F4310 Post-traumatic stress disorder, unspecified: Secondary | ICD-10-CM | POA: Diagnosis present

## 2023-12-10 DIAGNOSIS — W19XXXA Unspecified fall, initial encounter: Principal | ICD-10-CM

## 2023-12-10 DIAGNOSIS — Z8249 Family history of ischemic heart disease and other diseases of the circulatory system: Secondary | ICD-10-CM

## 2023-12-10 DIAGNOSIS — R410 Disorientation, unspecified: Secondary | ICD-10-CM | POA: Diagnosis not present

## 2023-12-10 DIAGNOSIS — I10 Essential (primary) hypertension: Secondary | ICD-10-CM | POA: Diagnosis present

## 2023-12-10 DIAGNOSIS — S12000A Unspecified displaced fracture of first cervical vertebra, initial encounter for closed fracture: Secondary | ICD-10-CM

## 2023-12-10 DIAGNOSIS — I69354 Hemiplegia and hemiparesis following cerebral infarction affecting left non-dominant side: Secondary | ICD-10-CM

## 2023-12-10 DIAGNOSIS — F32A Depression, unspecified: Secondary | ICD-10-CM | POA: Diagnosis present

## 2023-12-10 DIAGNOSIS — S1201XA Stable burst fracture of first cervical vertebra, initial encounter for closed fracture: Principal | ICD-10-CM | POA: Diagnosis present

## 2023-12-10 DIAGNOSIS — I712 Thoracic aortic aneurysm, without rupture, unspecified: Secondary | ICD-10-CM | POA: Diagnosis present

## 2023-12-10 DIAGNOSIS — Y92008 Other place in unspecified non-institutional (private) residence as the place of occurrence of the external cause: Secondary | ICD-10-CM | POA: Diagnosis not present

## 2023-12-10 DIAGNOSIS — S12190A Other displaced fracture of second cervical vertebra, initial encounter for closed fracture: Secondary | ICD-10-CM | POA: Diagnosis present

## 2023-12-10 DIAGNOSIS — Z7982 Long term (current) use of aspirin: Secondary | ICD-10-CM

## 2023-12-10 DIAGNOSIS — W010XXA Fall on same level from slipping, tripping and stumbling without subsequent striking against object, initial encounter: Secondary | ICD-10-CM | POA: Diagnosis present

## 2023-12-10 DIAGNOSIS — S12001G Unspecified nondisplaced fracture of first cervical vertebra, subsequent encounter for fracture with delayed healing: Secondary | ICD-10-CM | POA: Diagnosis not present

## 2023-12-10 DIAGNOSIS — E876 Hypokalemia: Secondary | ICD-10-CM | POA: Diagnosis present

## 2023-12-10 DIAGNOSIS — S12201A Unspecified nondisplaced fracture of third cervical vertebra, initial encounter for closed fracture: Secondary | ICD-10-CM | POA: Diagnosis not present

## 2023-12-10 DIAGNOSIS — E785 Hyperlipidemia, unspecified: Secondary | ICD-10-CM | POA: Diagnosis present

## 2023-12-10 DIAGNOSIS — Z800 Family history of malignant neoplasm of digestive organs: Secondary | ICD-10-CM

## 2023-12-10 DIAGNOSIS — F419 Anxiety disorder, unspecified: Secondary | ICD-10-CM | POA: Diagnosis present

## 2023-12-10 DIAGNOSIS — I4510 Unspecified right bundle-branch block: Secondary | ICD-10-CM | POA: Diagnosis present

## 2023-12-10 DIAGNOSIS — G4733 Obstructive sleep apnea (adult) (pediatric): Secondary | ICD-10-CM | POA: Diagnosis present

## 2023-12-10 DIAGNOSIS — Z91030 Bee allergy status: Secondary | ICD-10-CM

## 2023-12-10 DIAGNOSIS — I7774 Dissection of vertebral artery: Secondary | ICD-10-CM | POA: Diagnosis not present

## 2023-12-10 DIAGNOSIS — I714 Abdominal aortic aneurysm, without rupture, unspecified: Secondary | ICD-10-CM | POA: Diagnosis present

## 2023-12-10 DIAGNOSIS — Z888 Allergy status to other drugs, medicaments and biological substances status: Secondary | ICD-10-CM

## 2023-12-10 DIAGNOSIS — Z87891 Personal history of nicotine dependence: Secondary | ICD-10-CM | POA: Diagnosis not present

## 2023-12-10 DIAGNOSIS — J449 Chronic obstructive pulmonary disease, unspecified: Secondary | ICD-10-CM | POA: Diagnosis present

## 2023-12-10 DIAGNOSIS — Z8042 Family history of malignant neoplasm of prostate: Secondary | ICD-10-CM

## 2023-12-10 DIAGNOSIS — Y9301 Activity, walking, marching and hiking: Secondary | ICD-10-CM | POA: Diagnosis present

## 2023-12-10 DIAGNOSIS — Z8774 Personal history of (corrected) congenital malformations of heart and circulatory system: Secondary | ICD-10-CM

## 2023-12-10 DIAGNOSIS — Z79899 Other long term (current) drug therapy: Secondary | ICD-10-CM | POA: Diagnosis not present

## 2023-12-10 DIAGNOSIS — S129XXA Fracture of neck, unspecified, initial encounter: Principal | ICD-10-CM | POA: Diagnosis present

## 2023-12-10 LAB — CBC WITH DIFFERENTIAL/PLATELET
Abs Immature Granulocytes: 0.04 K/uL (ref 0.00–0.07)
Basophils Absolute: 0 K/uL (ref 0.0–0.1)
Basophils Relative: 1 %
Eosinophils Absolute: 0 K/uL (ref 0.0–0.5)
Eosinophils Relative: 1 %
HCT: 42 % (ref 39.0–52.0)
Hemoglobin: 14.8 g/dL (ref 13.0–17.0)
Immature Granulocytes: 1 %
Lymphocytes Relative: 11 %
Lymphs Abs: 1 K/uL (ref 0.7–4.0)
MCH: 34.2 pg — ABNORMAL HIGH (ref 26.0–34.0)
MCHC: 35.2 g/dL (ref 30.0–36.0)
MCV: 97 fL (ref 80.0–100.0)
Monocytes Absolute: 0.6 K/uL (ref 0.1–1.0)
Monocytes Relative: 7 %
Neutro Abs: 6.9 K/uL (ref 1.7–7.7)
Neutrophils Relative %: 79 %
Platelets: 182 K/uL (ref 150–400)
RBC: 4.33 MIL/uL (ref 4.22–5.81)
RDW: 11.4 % — ABNORMAL LOW (ref 11.5–15.5)
WBC: 8.6 K/uL (ref 4.0–10.5)
nRBC: 0 % (ref 0.0–0.2)

## 2023-12-10 LAB — BASIC METABOLIC PANEL WITH GFR
Anion gap: 19 — ABNORMAL HIGH (ref 5–15)
BUN: 18 mg/dL (ref 8–23)
CO2: 19 mmol/L — ABNORMAL LOW (ref 22–32)
Calcium: 8.6 mg/dL — ABNORMAL LOW (ref 8.9–10.3)
Chloride: 101 mmol/L (ref 98–111)
Creatinine, Ser: 0.87 mg/dL (ref 0.61–1.24)
GFR, Estimated: >60 mL/min (ref >60)
Glucose, Bld: 108 mg/dL — ABNORMAL HIGH (ref 70–99)
Potassium: 3.2 mmol/L — ABNORMAL LOW (ref 3.5–5.1)
Sodium: 139 mmol/L (ref 135–145)

## 2023-12-10 LAB — URINALYSIS, ROUTINE W REFLEX MICROSCOPIC
Bilirubin Urine: NEGATIVE
Glucose, UA: NEGATIVE mg/dL
Hgb urine dipstick: NEGATIVE
Ketones, ur: 20 mg/dL — AB
Leukocytes,Ua: NEGATIVE
Nitrite: NEGATIVE
Protein, ur: NEGATIVE mg/dL
Specific Gravity, Urine: 1.017 (ref 1.005–1.030)
pH: 5 (ref 5.0–8.0)

## 2023-12-10 LAB — TYPE AND SCREEN
ABO/RH(D): A POS
Antibody Screen: NEGATIVE

## 2023-12-10 LAB — PROTIME-INR
INR: 1 (ref 0.8–1.2)
Prothrombin Time: 13.5 s (ref 11.4–15.2)

## 2023-12-10 MED ORDER — ACETAMINOPHEN 650 MG RE SUPP: 650.0000 mg | Freq: Four times a day (QID) | RECTAL | Status: DC | PRN

## 2023-12-10 MED ORDER — METHOCARBAMOL 1000 MG/10ML IJ SOLN
500.0000 mg | Freq: Four times a day (QID) | INTRAMUSCULAR | Status: DC | PRN
  Administered 2023-12-11: 500 mg via INTRAVENOUS
  Filled 2023-12-10: qty 5
  Filled 2023-12-10: qty 10
  Filled 2023-12-10: qty 5

## 2023-12-10 MED ORDER — POTASSIUM CHLORIDE IN NACL 20-0.9 MEQ/L-% IV SOLN
INTRAVENOUS | Status: DC
  Filled 2023-12-10 (×2): qty 1000

## 2023-12-10 MED ORDER — LABETALOL HCL 5 MG/ML IV SOLN
20.0000 mg | INTRAVENOUS | Status: DC | PRN
  Administered 2023-12-15 – 2023-12-16 (×3): 20 mg via INTRAVENOUS
  Filled 2023-12-10 (×3): qty 4

## 2023-12-10 MED ORDER — EZETIMIBE 10 MG PO TABS
10.0000 mg | ORAL_TABLET | Freq: Every day | ORAL | Status: DC
  Administered 2023-12-11 – 2023-12-17 (×6): 10 mg via ORAL
  Filled 2023-12-10 (×7): qty 1

## 2023-12-10 MED ORDER — MORPHINE SULFATE (PF) 2 MG/ML IV SOLN
2.0000 mg | INTRAVENOUS | Status: DC | PRN
  Administered 2023-12-10: 2 mg via INTRAVENOUS
  Filled 2023-12-10: qty 1

## 2023-12-10 MED ORDER — IOHEXOL 350 MG/ML SOLN
75.0000 mL | Freq: Once | INTRAVENOUS | Status: AC | PRN
  Administered 2023-12-10: 75 mL via INTRAVENOUS

## 2023-12-10 MED ORDER — ONDANSETRON HCL 4 MG/2ML IJ SOLN
4.0000 mg | Freq: Four times a day (QID) | INTRAMUSCULAR | Status: DC | PRN
  Administered 2023-12-11 – 2023-12-15 (×4): 4 mg via INTRAVENOUS
  Filled 2023-12-10 (×4): qty 2

## 2023-12-10 MED ORDER — ONDANSETRON HCL 4 MG PO TABS: 4.0000 mg | ORAL_TABLET | Freq: Four times a day (QID) | ORAL | Status: DC | PRN

## 2023-12-10 MED ORDER — HYDROCHLOROTHIAZIDE 12.5 MG PO TABS
12.5000 mg | ORAL_TABLET | Freq: Every day | ORAL | Status: DC
Start: 2023-12-11 — End: 2023-12-11
  Administered 2023-12-11: 12.5 mg via ORAL
  Filled 2023-12-10: qty 1

## 2023-12-10 MED ORDER — MAGNESIUM HYDROXIDE 400 MG/5ML PO SUSP: 30.0000 mL | Freq: Every day | ORAL | Status: DC | PRN

## 2023-12-10 MED ORDER — LAMOTRIGINE 100 MG PO TABS
200.0000 mg | ORAL_TABLET | Freq: Two times a day (BID) | ORAL | Status: DC
  Administered 2023-12-11 – 2023-12-17 (×12): 200 mg via ORAL
  Filled 2023-12-10 (×11): qty 2
  Filled 2023-12-10: qty 8

## 2023-12-10 MED ORDER — TRAZODONE HCL 50 MG PO TABS
50.0000 mg | ORAL_TABLET | Freq: Every evening | ORAL | Status: DC | PRN
  Filled 2023-12-10: qty 1

## 2023-12-10 MED ORDER — HYDRALAZINE HCL 20 MG/ML IJ SOLN: 10.0000 mg | Freq: Four times a day (QID) | INTRAMUSCULAR | Status: DC | PRN

## 2023-12-10 MED ORDER — ACETAMINOPHEN 325 MG PO TABS
650.0000 mg | ORAL_TABLET | Freq: Four times a day (QID) | ORAL | Status: DC | PRN
  Administered 2023-12-14 – 2023-12-17 (×9): 650 mg via ORAL
  Filled 2023-12-10 (×9): qty 2

## 2023-12-10 MED ORDER — HYDROMORPHONE HCL 1 MG/ML IJ SOLN
1.0000 mg | Freq: Once | INTRAMUSCULAR | Status: AC
  Administered 2023-12-10: 1 mg via INTRAVENOUS
  Filled 2023-12-10: qty 1

## 2023-12-10 MED ORDER — ONDANSETRON HCL 4 MG/2ML IJ SOLN
4.0000 mg | Freq: Once | INTRAMUSCULAR | Status: AC
  Administered 2023-12-10: 4 mg via INTRAVENOUS
  Filled 2023-12-10: qty 2

## 2023-12-10 MED ORDER — LOSARTAN POTASSIUM 50 MG PO TABS
50.0000 mg | ORAL_TABLET | Freq: Every day | ORAL | Status: DC
  Administered 2023-12-11 – 2023-12-17 (×6): 50 mg via ORAL
  Filled 2023-12-10 (×2): qty 1
  Filled 2023-12-10: qty 2
  Filled 2023-12-10 (×4): qty 1

## 2023-12-10 MED ORDER — QUETIAPINE FUMARATE 25 MG PO TABS
25.0000 mg | ORAL_TABLET | Freq: Every day | ORAL | Status: DC
  Administered 2023-12-11: 25 mg via ORAL
  Filled 2023-12-10: qty 1

## 2023-12-10 MED ORDER — MIRTAZAPINE 15 MG PO TABS
45.0000 mg | ORAL_TABLET | Freq: Every day | ORAL | Status: DC
  Administered 2023-12-11 – 2023-12-16 (×6): 45 mg via ORAL
  Filled 2023-12-10 (×6): qty 3

## 2023-12-10 NOTE — ED Provider Notes (Signed)
 [place] EMERGENCY DEPARTMENT AT [facility] Provider Note   CSN: [id number] Arrival date & time: 12/10/23  1547     Patient presents with: [name] is a 73 y.o. male.  He is brought in by ambulance from home.  He said he was walking on his deck and fell, hit the top of his head and jabbed his neck.  Complaining of severe occipital and neck pain.  Has some pain and paresthesias in his right hand and left greater than right foot.  Prior history of discectomy.  Denies any chest pain shortness of breath abdominal pain vomiting diarrhea.  No loss of consciousness.  No blood thinners other than an aspirin .  {Add pertinent medical, surgical, social history, OB history to YEP:67052} The history is provided by the patient.  Fall This is a new problem. The current episode started 1 to 2 hours ago. The problem has not changed since onset.Associated symptoms include headaches. Pertinent negatives include no chest pain, no abdominal pain and no shortness of breath. Nothing aggravates the symptoms. Nothing relieves the symptoms. He has tried rest for the symptoms. The treatment provided no relief.       Prior to Admission medications   Medication Sig Start Date End Date Taking? Authorizing Provider  acetaminophen  (TYLENOL ) 500 MG tablet Take 500-1,000 mg by mouth every 6 (six) hours as needed for moderate pain.   Yes [provider]  aspirin  81 MG EC tablet Take 1 tablet (81 mg total) by mouth daily. Swallow whole. 06/12/21  Yes Cook, Jayce G, DO  ezetimibe (ZETIA) 10 MG tablet TAKE ONE TABLET BY MOUTH ONCE DAILY 11/26/23  Yes Cook, Jayce G, DO  hydrochlorothiazide (MICROZIDE) 12.5 MG capsule Take 1 capsule (12.5 mg total) by mouth daily. 11/16/23  Yes Cook, Jayce G, DO  lamoTRIgine  (LAMICTAL ) 200 MG tablet Take 200 mg by mouth 2 (two) times daily.  08/12/17  Yes [provider]  losartan  (COZAAR ) 50 MG tablet Take 1 tablet (50 mg total) by mouth daily.  06/22/23  Yes Cook, Jayce G, DO  mirtazapine  (REMERON ) 45 MG tablet Take 45 mg by mouth at bedtime. 10/06/17  Yes [provider]  QUEtiapine (SEROQUEL) 25 MG tablet Take 25 mg by mouth at bedtime.   Yes [provider]    Allergies: Aricept  [donepezil  hcl], Bee venom, Namenda  [memantine  hcl], Divalproex sodium, Oxcarbazepine, Quinine derivatives, and Statins    Review of Systems  Constitutional:  Negative for fever.  HENT:  Negative for sore throat.   Respiratory:  Negative for shortness of breath.   Cardiovascular:  Negative for chest pain.  Gastrointestinal:  Negative for abdominal pain.  Genitourinary:  Negative for dysuria.  Musculoskeletal:  Positive for neck pain.  Neurological:  Positive for numbness and headaches. Negative for speech difficulty and weakness.    Updated Vital Signs BP (!) 159/91 (BP Location: Left Arm)   Pulse 70   Temp 98.4 F (36.9 C) (Oral)   Resp (!) 26   Ht 6' 1 (1.854 m)   Wt 101.2 kg   SpO2 96%   BMI 29.44 kg/m   Physical Exam Vitals and nursing note reviewed.  Constitutional:      General: He is not in acute distress.    Appearance: Normal appearance. He is well-developed.  HENT:     Head: Normocephalic and atraumatic.  Eyes:     Conjunctiva/sclera: Conjunctivae normal.  Neck:     Comments: Patient is in cervical collar.  Complaining of upper C-spine pain. Cardiovascular:     Rate and Rhythm: Normal rate and regular rhythm.     Heart sounds: No murmur heard. Pulmonary:     Effort: Pulmonary effort is normal. No respiratory distress.     Breath sounds: Normal breath sounds.  Abdominal:     Palpations: Abdomen is soft.     Tenderness: There is no abdominal tenderness.  Musculoskeletal:        General: Signs of injury present. No deformity.     Cervical back: Tenderness present.     Comments: Abrasion left shin  Skin:    General: Skin is warm and dry.     Capillary Refill: Capillary refill takes less than 2 seconds.   Neurological:     Mental Status: He is alert.     Cranial Nerves: No cranial nerve deficit.     Sensory: Sensory deficit present.     Motor: No weakness.     Comments: Complaining of slight diminished sensation left foot greater than right foot.     (all labs ordered are listed, but only abnormal results are displayed) Labs Reviewed - No data to display  EKG: EKG Interpretation Date/Time:  Thursday December 10 2023 15:58:03 EDT Ventricular Rate:  65 PR Interval:  191 QRS Duration:  117 QT Interval:  438 QTC Calculation: 456 R Axis:   74  Text Interpretation: Sinus rhythm Incomplete right bundle branch block No significant change since prior 7/25 Confirmed by Towana Sharper [phone]) on 12/10/2023 4:09:42 PM  Radiology: No results found.  {Document cardiac monitor, telemetry assessment procedure when appropriate:32947} Procedures   Medications Ordered in the ED  HYDROmorphone  (DILAUDID ) injection 1 mg (has no administration in time range)      {Click here for ABCD2, HEART and other calculators REFRESH Note before signing:1}                              Medical Decision Making Amount and/or Complexity of Data Reviewed Labs: ordered. Radiology: ordered.  Risk Prescription drug management.   This patient complains of ***; this involves an extensive number of treatment Options and is a complaint that carries with it a high risk of complications and morbidity. The differential includes ***  I ordered, reviewed and interpreted labs, which included *** I ordered medication *** and reviewed PMP when indicated. I ordered imaging studies which included *** and I independently    visualized and interpreted imaging which showed *** Additional history obtained from *** Previous records obtained and reviewed *** I consulted *** and discussed lab and imaging findings and discussed disposition.  Cardiac monitoring reviewed, *** Social determinants considered, *** Critical  Interventions: ***  After the interventions stated above, I reevaluated the patient and found *** Admission and further testing considered, ***   {Document critical care time when appropriate  Document review of labs and clinical decision tools ie CHADS2VASC2, etc  Document your independent review of radiology images and any outside records  Document your discussion with family members, caretakers and with consultants  Document social determinants of health affecting pt's care  Document your decision making why or why not admission, treatments were needed:32947:::1}   Final diagnoses:  None    ED Discharge Orders     None

## 2023-12-10 NOTE — ED Triage Notes (Signed)
 Pt arrived via [facility] from home following a fall. Pt presents with a C-Collar in place. Pt denies LOC. Pt reports he tripped over some fire wood on his porch and hit the back of his head. EMS established IV access and administered 10mg  Morphine  and 100cc Normal Saline PTA.

## 2023-12-10 NOTE — ED Notes (Signed)
 RN increased pts O2 to 3L [place] as his O2 was sitting at 89%. Returned to 94%

## 2023-12-10 NOTE — ED Notes (Addendum)
 Assumed care of pt, found him in bed alert and oriented.  He fully understands the situation and is very compliant w/ requests.  He denies any needs at this time.  He is able to move all extremities at this time and has sensation as well.

## 2023-12-10 NOTE — Progress Notes (Addendum)
 CT reviewed - comminuted [place] fracture CTA reviewed - appears to be right V3 dissection  Hard cervical collar Anticipate will start antiplatelet agent Will discuss with Dr. Mavis in AM

## 2023-12-10 NOTE — ED Notes (Signed)
 Pt c/o tingling in bilateral legs and in right arm since he has been here. EDP aware.

## 2023-12-10 NOTE — ED Notes (Signed)
 Pt called out, pain has increased, requesting pain medications.  Provider made aware.

## 2023-12-10 NOTE — ED Triage Notes (Signed)
 Pt reports previous C3-C4 surgery and reports occipital pain in his head. Pt denies blood thinners. Pt does also have a skin tear on his left shin. Bleeding controlled at this time.

## 2023-12-10 NOTE — ED Notes (Signed)
 RN found pt speaking to neighbor who is taking care of wife. Pt indicated that his pain has decreased to 5.  No complaints at this time.  Pt is able to move all limbs at this time, not experiencing any tingling or numbness at this time.
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
## 2023-12-11 DIAGNOSIS — S12201A Unspecified nondisplaced fracture of third cervical vertebra, initial encounter for closed fracture: Secondary | ICD-10-CM

## 2023-12-11 DIAGNOSIS — E785 Hyperlipidemia, unspecified: Secondary | ICD-10-CM

## 2023-12-11 DIAGNOSIS — F32A Depression, unspecified: Secondary | ICD-10-CM | POA: Insufficient documentation

## 2023-12-11 LAB — BASIC METABOLIC PANEL WITH GFR
Anion gap: 9 (ref 5–15)
BUN: 17 mg/dL (ref 8–23)
CO2: 28 mmol/L (ref 22–32)
Calcium: 8.6 mg/dL — ABNORMAL LOW (ref 8.9–10.3)
Chloride: 102 mmol/L (ref 98–111)
Creatinine, Ser: 0.78 mg/dL (ref 0.61–1.24)
GFR, Estimated: >60 mL/min (ref >60)
Glucose, Bld: 132 mg/dL — ABNORMAL HIGH (ref 70–99)
Potassium: 4.2 mmol/L (ref 3.5–5.1)
Sodium: 140 mmol/L (ref 135–145)

## 2023-12-11 LAB — TYPE AND SCREEN
ABO/RH(D): A POS
Antibody Screen: NEGATIVE

## 2023-12-11 LAB — CBC
HCT: 40.9 % (ref 39.0–52.0)
Hemoglobin: 14 g/dL (ref 13.0–17.0)
MCH: 34 pg (ref 26.0–34.0)
MCHC: 34.2 g/dL (ref 30.0–36.0)
MCV: 99.3 fL (ref 80.0–100.0)
Platelets: 179 K/uL (ref 150–400)
RBC: 4.12 MIL/uL — ABNORMAL LOW (ref 4.22–5.81)
RDW: 11.7 % (ref 11.5–15.5)
WBC: 7.8 K/uL (ref 4.0–10.5)
nRBC: 0 % (ref 0.0–0.2)

## 2023-12-11 MED ORDER — OXYCODONE HCL 5 MG PO TABS
5.0000 mg | ORAL_TABLET | ORAL | Status: DC | PRN
  Administered 2023-12-11: 5 mg via ORAL
  Administered 2023-12-12: 10 mg via ORAL
  Administered 2023-12-12: 5 mg via ORAL
  Administered 2023-12-13 – 2023-12-14 (×5): 10 mg via ORAL
  Administered 2023-12-14: 5 mg via ORAL
  Administered 2023-12-15 – 2023-12-17 (×10): 10 mg via ORAL
  Filled 2023-12-11 (×11): qty 2
  Filled 2023-12-11: qty 1
  Filled 2023-12-11: qty 2
  Filled 2023-12-11 (×2): qty 1
  Filled 2023-12-11 (×5): qty 2

## 2023-12-11 MED ORDER — KCL IN DEXTROSE-NACL 20-5-0.9 MEQ/L-%-% IV SOLN
INTRAVENOUS | Status: AC
  Filled 2023-12-11 (×2): qty 1000

## 2023-12-11 MED ORDER — METHOCARBAMOL 1000 MG/10ML IJ SOLN
500.0000 mg | Freq: Four times a day (QID) | INTRAMUSCULAR | Status: DC | PRN
  Administered 2023-12-11 – 2023-12-16 (×12): 500 mg via INTRAVENOUS
  Filled 2023-12-11 (×9): qty 10
  Filled 2023-12-11: qty 5
  Filled 2023-12-11 (×2): qty 10

## 2023-12-11 MED ORDER — LORAZEPAM 2 MG/ML IJ SOLN
0.5000 mg | INTRAMUSCULAR | Status: DC | PRN
  Administered 2023-12-11 (×3): 0.5 mg via INTRAVENOUS
  Filled 2023-12-11 (×3): qty 1

## 2023-12-11 MED ORDER — HYDROMORPHONE HCL 1 MG/ML IJ SOLN
1.0000 mg | INTRAMUSCULAR | Status: DC | PRN
  Administered 2023-12-11 (×2): 1 mg via INTRAVENOUS
  Filled 2023-12-11 (×2): qty 1

## 2023-12-11 MED ORDER — HYDROMORPHONE HCL 1 MG/ML IJ SOLN
1.0000 mg | INTRAMUSCULAR | Status: DC | PRN
Start: 2023-12-11 — End: 2023-12-12
  Administered 2023-12-11 – 2023-12-12 (×7): 1 mg via INTRAVENOUS
  Filled 2023-12-11 (×7): qty 1

## 2023-12-11 NOTE — ED Notes (Signed)
 Pt in bed, pt reports neck pain, pt has c collar in place, pt supine, pt can move all extremities, pt has positive sensation to touch, pt reports history of a stroke with some L arm weakness and decreased sensation, pt reports this is his normal. Pt has slight decreased sensation and strength in L arm, pt states this is his normal.  Resps even and unlabored, pt oriented.

## 2023-12-11 NOTE — ED Notes (Signed)
 Pt asked to speak with the charge RN. Pt concerned because he is just sitting waiting on a bed. Pt informed that the hospital was full but that this RN would look into how long the wait is. Pt updated that beds are coming available and that he is next to get a bed. Pt verbalized understanding at this time.

## 2023-12-11 NOTE — Assessment & Plan Note (Signed)
-   Will continue Remeron , Seroquel and Lamictal .

## 2023-12-11 NOTE — Assessment & Plan Note (Signed)
-   Will continue antihypertensive therapy.

## 2023-12-11 NOTE — Progress Notes (Signed)
   12/11/23 1020  TOC Brief Assessment  Insurance and Status Reviewed  Patient has primary care physician Yes  Home environment has been reviewed Home w/ spouse  Prior level of function: Independent  Prior/Current Home Services No current home services  Social Drivers of Health Review SDOH reviewed no interventions necessary  Readmission risk has been reviewed Yes  Transition of care needs transition of care needs identified, TOC will continue to follow

## 2023-12-11 NOTE — Assessment & Plan Note (Addendum)
-   The patient will be admitted to a progressive unit bed at [facility] - The patient currently has mildly diminished sensation in both legs and right hand which has actually resolved. - Pain management will be provided. - He has a neck collar. - Neurosurgery consult will be obtained upon arrival. - Dr. Darnella was notified and is aware of the patient and NS will see the patient upon arrival.

## 2023-12-11 NOTE — ED Notes (Signed)
 [facility] arrived and with pt at bedside.

## 2023-12-11 NOTE — Assessment & Plan Note (Signed)
Will continue Zetia.

## 2023-12-11 NOTE — ED Notes (Signed)
 Pt in bed, pt continues to report significant pain, pt reports some muscle spasms, meds given.

## 2023-12-11 NOTE — Progress Notes (Signed)
 PROGRESS NOTE    [name]  FMW:[id number] DOB: 1950/03/07 DOA: 12/10/2023 PCP: Cook, Jayce G, DO   Brief Narrative:    [name] is a 73 y.o. male with medical history significant for anxiety, depression, osteoarthritis, essential hypertension and OSA, as well as PFO, who presented to the emergency room with acute onset of fall after tripping on his deck and subsequent head injury and neck pain.  Patient was admitted with close cervical spine fracture and is awaiting transfer to [facility] for further evaluation per neurosurgery.  Assessment & Plan:   Principal Problem:   Closed cervical spine fracture (HCC) Active Problems:   Essential hypertension   Dyslipidemia   Anxiety and depression  Assessment and Plan:   Closed cervical spine fracture [facility]) - The patient will be admitted to [facility] - The patient currently has mildly diminished sensation in both legs and right hand which has actually resolved. - Pain management will be provided. - He has a neck collar. - Neurosurgery consult will be obtained upon arrival. - Dr. Darnella was notified and is aware of the patient and NS will see the patient upon arrival.   Essential hypertension - Will continue antihypertensive therapy.   Dyslipidemia - Will continue Zetia.   Anxiety and depression - Will continue Remeron , Seroquel and Lamictal .   DVT prophylaxis:SCDs Code Status: Full Family Communication: None at bedside Disposition Plan:  Status is: Inpatient Remains inpatient appropriate because: Need for inpatient evaluation   Consultants:  Neurosurgery  Procedures:  None  Antimicrobials:  None   Subjective: Patient seen and evaluated today with some ongoing headaches and neck pain, but denies any other complaints.  Objective: Vitals:   12/11/23 1228 12/11/23 1245 12/11/23 1246 12/11/23 1300  BP:  (!) 150/71  137/69  Pulse: (!) 57 62 63 60  Resp: 16 (!) 23 14 19   Temp:      TempSrc:      SpO2: 92%   92% 93%  Weight:      Height:       No intake or output data in the 24 hours ending 12/11/23 1339 Filed Weights   12/10/23 1557  Weight: 101.2 kg    Examination:  General exam: Appears calm and comfortable  Respiratory system: Clear to auscultation. Respiratory effort normal. Cardiovascular system: S1 & S2 heard, RRR.  Gastrointestinal system: Abdomen is soft Central nervous system: Alert and awake Extremities: No edema Skin: No significant lesions noted Psychiatry: Flat affect.    Data Reviewed: I have personally reviewed following labs and imaging studies  CBC: Recent Labs  Lab 12/10/23 1655 12/11/23 0455  WBC 8.6 7.8  NEUTROABS 6.9  --   HGB 14.8 14.0  HCT 42.0 40.9  MCV 97.0 99.3  PLT 182 179   Basic Metabolic Panel: Recent Labs  Lab 12/10/23 1655 12/11/23 0455  NA 139 140  K 3.2* 4.2  CL 101 102  CO2 19* 28  GLUCOSE 108* 132*  BUN 18 17  CREATININE 0.87 0.78  CALCIUM  8.6* 8.6*   GFR: Estimated Creatinine Clearance: 99.8 mL/min (by C-G formula based on SCr of 0.78 mg/dL). Liver Function Tests: No results for input(s): AST, ALT, ALKPHOS, BILITOT, PROT, ALBUMIN in the last 168 hours. No results for input(s): LIPASE, AMYLASE in the last 168 hours. No results for input(s): AMMONIA in the last 168 hours. Coagulation Profile: Recent Labs  Lab 12/10/23 1655  INR 1.0   Cardiac Enzymes: No results for input(s): CKTOTAL, CKMB, CKMBINDEX, TROPONINI in the  last 168 hours. BNP (last 3 results) No results for input(s): PROBNP in the last 8760 hours. HbA1C: No results for input(s): HGBA1C in the last 72 hours. CBG: No results for input(s): GLUCAP in the last 168 hours. Lipid Profile: No results for input(s): CHOL, HDL, LDLCALC, TRIG, CHOLHDL, LDLDIRECT in the last 72 hours. Thyroid  Function Tests: No results for input(s): TSH, T4TOTAL, FREET4, T3FREE, THYROIDAB in the last 72 hours. Anemia  Panel: No results for input(s): VITAMINB12, FOLATE, FERRITIN, TIBC, IRON, RETICCTPCT in the last 72 hours. Sepsis Labs: No results for input(s): PROCALCITON, LATICACIDVEN in the last 168 hours.  No results found for this or any previous visit (from the past 240 hours).       Radiology Studies: CT ANGIO HEAD NECK W WO CM Result Date: 12/10/2023 EXAM: CTA HEAD AND NECK WITHOUT AND WITH 12/10/2023 06:44:00 PM TECHNIQUE: CTA of the head and neck was performed without and with the administration of intravenous contrast. Multiplanar 2D and/or 3D reformatted images are provided for review. Automated exposure control, iterative reconstruction, and/or weight based adjustment of the mA/kV was utilized to reduce the radiation dose to as low as reasonably achievable. Stenosis of the internal carotid arteries measured using NASCET criteria. COMPARISON: None available CLINICAL HISTORY: Neck trauma, arterial injury suspected. Pt arrived via [facility] from home following a fall. FINDINGS: AORTIC ARCH AND ARCH VESSELS: No dissection or arterial injury. No significant stenosis of the brachiocephalic or subclavian arteries. CERVICAL CAROTID ARTERIES: No dissection, arterial injury, or hemodynamically significant stenosis by NASCET criteria. CERVICAL VERTEBRAL ARTERIES: Irregularity of the right v3 vertebral artery with intramural filling defect. Resulting moderate to severe stenosis. Left vertebral artery is patent without significant stenosis. LUNGS AND MEDIASTINUM: Unremarkable. SOFT TISSUES: No acute abnormality. BONES: Please see same day CT cervical spine for characterization of know2 ANTERIOR CIRCULATION: No significant stenosis of the internal carotid arteries. No significant stenosis of the anterior cerebral arteries. No significant stenosis of the middle cerebral arteries. No aneurysm. POSTERIOR CIRCULATION: No significant stenosis of the posterior cerebral arteries. No significant stenosis of  the basilar artery or intradural vertebral arteries. No aneurysm. OTHER: No dural venous sinus thrombosis on this non-dedicated study. IMPRESSION: 1. Irregularity of the right v3 vertebral artery with intramural filling defect, compatible with acute arterial injury with intraluminal thrombus and/or dissection given adjacent fractures. Resulting moderate to severe stenosis. Findings discussed with Dr. Towana via telephone at 7:05 PM. Electronically signed by: Gilmore Molt MD 12/10/2023 07:07 PM EDT RP Workstation: [id number]   CT Cervical Spine Wo Contrast Result Date: 12/10/2023 EXAM: CT CERVICAL SPINE WITHOUT CONTRAST 12/10/2023 05:22:30 PM TECHNIQUE: CT of the cervical spine was performed without the administration of intravenous contrast. Multiplanar reformatted images are provided for review. Automated exposure control, iterative reconstruction, and/or weight based adjustment of the mA/kV was utilized to reduce the radiation dose to as low as reasonably achievable. COMPARISON: CT 04/16/2021 CLINICAL HISTORY: Neck trauma (Age >= 65y). Pt arrived via [facility] from home following a fall. Pt presents with a C-Collar in place. Pt denies LOC. Pt reports he tripped over some fire wood on his porch and hit the back of his head. EMS established IV access and administered 10mg  Morphine  and 100cc Normal Saline PTA. FINDINGS: CERVICAL SPINE: BONES AND ALIGNMENT: Bilateral fractures of the anterior and posterior arches of C1. The left-sided anterior C1 fracture extends into the inferior facet and transverse foramen of C1. Additional mildly displaced fracture of the superior and inferior facet of C2 on the right  extending into the transverse foramen. There is 4 - 5 mm rightward displacement of the lateral masses of C1 in relation to the dens compatible with ligamentous disruption. ACDF C3-C4. Ankylosis of C6-C7 and C7-T1. DEGENERATIVE CHANGES: Bulky anterior osteophytes from C4-C7. Multilevel moderate to advanced  facet arthropathy. No severe spinal canal narrowing. SOFT TISSUES: No prevertebral soft tissue swelling. IMPRESSION: 1. Comminuted burst fracture of C1 (Jefferson fracture). The fracture extends into the left lateral mass of C1 including the transverse foramen. 4 - 5 mm of rightward displacement of the lateral masses of C1 in relation to the dens compatible with ligamentous disruption. Recommend neurosurgery consult and consider MRI. 2. Mildly displaced fractures of the superior and inferior facets of C2 on the right extending into the transverse foramen. CTA of the head and neck is recommended to evaluate for arterial injury. Critical value/emergent results were called by telephone at the time of interpretation on 10 / 23 / 25 at 5:43 pm to Dr. Towana, who verbally acknowledged these results. Electronically signed by: Norman Gatlin MD 12/10/2023 05:45 PM EDT RP Workstation: [id number]   CT Head Wo Contrast Result Date: 12/10/2023 CLINICAL DATA:  Head trauma, minor (Age >= 65y).  Fall. EXAM: CT HEAD WITHOUT CONTRAST TECHNIQUE: Contiguous axial images were obtained from the base of the skull through the vertex without intravenous contrast. RADIATION DOSE REDUCTION: This exam was performed according to the departmental dose-optimization program which includes automated exposure control, adjustment of the mA and/or kV according to patient size and/or use of iterative reconstruction technique. COMPARISON:  10/16/2022 FINDINGS: Brain: No acute intracranial abnormality. Specifically, no hemorrhage, hydrocephalus, mass lesion, acute infarction, or significant intracranial injury. Vascular: No hyperdense vessel or unexpected calcification. Skull: No acute calvarial abnormality. Sinuses/Orbits: No acute findings Other: None IMPRESSION: No acute intracranial abnormality. Electronically Signed   By: Franky Crease M.D.   On: 12/10/2023 17:31        Scheduled Meds:  ezetimibe  10 mg Oral Daily   hydrochlorothiazide   12.5 mg Oral Daily   lamoTRIgine   200 mg Oral BID   losartan   50 mg Oral Daily   mirtazapine   45 mg Oral QHS   QUEtiapine  25 mg Oral QHS   Continuous Infusions:  0.9 % NaCl with KCl 20 mEq / L 100 mL/hr at 12/11/23 1146     LOS: 1 day    Time spent: 55 minutes    Natassja Ollis JONETTA Fairly, DO [facility]  If 7PM-7AM, please contact night-coverage [email or web address] 12/11/2023, 1:39 PM

## 2023-12-12 DIAGNOSIS — S12001G Unspecified nondisplaced fracture of first cervical vertebra, subsequent encounter for fracture with delayed healing: Secondary | ICD-10-CM | POA: Diagnosis not present

## 2023-12-12 LAB — CBC
HCT: 38.8 % — ABNORMAL LOW (ref 39.0–52.0)
Hemoglobin: 13 g/dL (ref 13.0–17.0)
MCH: 33.5 pg (ref 26.0–34.0)
MCHC: 33.5 g/dL (ref 30.0–36.0)
MCV: 100 fL (ref 80.0–100.0)
Platelets: 169 K/uL (ref 150–400)
RBC: 3.88 MIL/uL — ABNORMAL LOW (ref 4.22–5.81)
RDW: 11.6 % (ref 11.5–15.5)
WBC: 7.1 K/uL (ref 4.0–10.5)
nRBC: 0 % (ref 0.0–0.2)

## 2023-12-12 LAB — MAGNESIUM: Magnesium: 1.9 mg/dL (ref 1.7–2.4)

## 2023-12-12 LAB — BASIC METABOLIC PANEL WITH GFR
Anion gap: 7 (ref 5–15)
BUN: 15 mg/dL (ref 8–23)
CO2: 27 mmol/L (ref 22–32)
Calcium: 8.3 mg/dL — ABNORMAL LOW (ref 8.9–10.3)
Chloride: 100 mmol/L (ref 98–111)
Creatinine, Ser: 0.79 mg/dL (ref 0.61–1.24)
GFR, Estimated: >60 mL/min (ref >60)
Glucose, Bld: 122 mg/dL — ABNORMAL HIGH (ref 70–99)
Potassium: 3.6 mmol/L (ref 3.5–5.1)
Sodium: 134 mmol/L — ABNORMAL LOW (ref 135–145)

## 2023-12-12 MED ORDER — HALOPERIDOL LACTATE 5 MG/ML IJ SOLN
2.0000 mg | Freq: Four times a day (QID) | INTRAMUSCULAR | Status: DC | PRN
  Administered 2023-12-14: 2 mg via INTRAVENOUS
  Filled 2023-12-12: qty 1

## 2023-12-12 MED ORDER — HYDROMORPHONE HCL 1 MG/ML IJ SOLN
1.0000 mg | INTRAMUSCULAR | Status: DC | PRN
  Administered 2023-12-12 – 2023-12-14 (×4): 1 mg via INTRAVENOUS
  Filled 2023-12-12 (×4): qty 1

## 2023-12-12 MED ORDER — QUETIAPINE FUMARATE 50 MG PO TABS
50.0000 mg | ORAL_TABLET | Freq: Every day | ORAL | Status: DC
  Administered 2023-12-12 – 2023-12-16 (×5): 50 mg via ORAL
  Filled 2023-12-12 (×5): qty 1

## 2023-12-12 MED ORDER — KETOROLAC TROMETHAMINE 15 MG/ML IJ SOLN
15.0000 mg | Freq: Three times a day (TID) | INTRAMUSCULAR | Status: AC
Start: 2023-12-12 — End: 2023-12-14
  Administered 2023-12-12 – 2023-12-14 (×6): 15 mg via INTRAVENOUS
  Filled 2023-12-12 (×6): qty 1

## 2023-12-12 MED ORDER — PANTOPRAZOLE SODIUM 40 MG PO TBEC
40.0000 mg | DELAYED_RELEASE_TABLET | Freq: Every day | ORAL | Status: DC
  Administered 2023-12-12 – 2023-12-15 (×3): 40 mg via ORAL
  Filled 2023-12-12 (×6): qty 1

## 2023-12-12 NOTE — Plan of Care (Signed)
  Problem: Education: Goal: Knowledge of General Education information will improve Description: Including pain rating scale, medication(s)/side effects and non-pharmacologic comfort measures Outcome: Progressing   Problem: Clinical Measurements: Goal: Ability to maintain clinical measurements within normal limits will improve Outcome: Progressing   Problem: Clinical Measurements: Goal: Diagnostic test results will improve Outcome: Progressing   

## 2023-12-12 NOTE — Progress Notes (Addendum)
 Pt got confused and needed to have a BM so undressed, got to the end of the bed, and tried to get out of bed.Pt has had dilaudid  x2 this shift and robaxin. Easily reoriented after his confusion. Pt requesting more pain medication, but this RN does not feel that the patient can tolerate it at this time.O2 placed on pt as well, as he has OSA and his saturation was dropping while asleep, but also required O2 to bring his saturation into the 90's while ambulation to the [facility]. His bed was sat up instead of flat, although he prefers it flat. Will reach out and see if we can decrease the frequency of dilaudid  and add something else for pain, as he states the oxy doesn't work and the dilaudid  Q2 seems to make him delirious.

## 2023-12-12 NOTE — Progress Notes (Signed)
 [name]  FMW:[id number] DOB: [birth year] DOA: 12/10/2023 PCP: Bluford Jacqulyn MATSU, DO    Brief Narrative:  73 year old with a history of anxiety/depression, osteoarthritis, HTN, PFO, and OSA who presented to the [facility] ER 10/23 after suffering a mechanical fall in which he tripped while on his deck and then fell head forward into a post striking the vertex of his head with great force.  At the time of his presentation he initially was experiencing numbness in his feet and right arm, but fortunately this resolved during his stay in the ER.  CT cervical spine at presentation confirmed a Jefferson fracture of C1 as well as mildly displaced fractures of the superior and inferior facets of C2. CTa of the head noted only irregularity of the right V3 vertebral artery with intramural filling defect compatible with acute arterial injury with intraluminal thrombus and/or dissection.  His case was discussed with Neurosurgery and the decision was made to transfer him to [facility] for definitive evaluation.  Goals of Care:   Code Status: Full Code   DVT prophylaxis: SCDs Start: 12/10/23 2257   Interim Hx: No acute events reported overnight.  Vital signs stable with some sinus bradycardia.  Blood pressure preserved.  Experiencing some delirium and impulsiveness associated with pain medications.  No new complaints at the time of my exam.  Reports that his pain is still poorly controlled.  Denies chest pain or shortness of breath.  Assessment & Plan:  Jefferson fracture (burst fx of C1) - Fracture of C2 The patient is in a hard cervical collar - fortunately he is neurologically intact at this time - Neurosurgery following with us   Possible right V3 vertebral artery dissection Aspirin  on hold until it can be determined if patient will require acute spine surgery  HTN Blood pressure presently well-controlled  Dyslipidemia Continue usual Zetia dose  Anxiety/depression Continue usual Remeron   Seroquel and Lamictal  doses  Acute delirium Give short-term trial of Toradol  in hopes of decreasing narcotic dependence -Seroquel to assist with sleep tonight  AAA Followed by Dr. Kerrin in the outpatient setting  Family Communication: No family present at time of exam Disposition: Unclear presently   Objective: Blood pressure 118/66, pulse (!) 54, temperature 98.6 F (37 C), resp. rate 19, height 6' 1 (1.854 m), weight 101.2 kg, SpO2 95%.  Intake/Output Summary (Last 24 hours) at 12/12/2023 0859 Last data filed at 12/12/2023 0600 Gross per 24 hour  Intake --  Output 600 ml  Net -600 ml   Filed Weights   12/10/23 1557  Weight: 101.2 kg    Examination: General: No acute respiratory distress Lungs: Clear to auscultation bilaterally without wheezes or crackles Cardiovascular: Regular rate and rhythm without murmur gallop or rub normal S1 and S2 Abdomen: Nontender, nondistended, soft, bowel sounds positive, no rebound, no ascites, no appreciable mass Extremities: No significant cyanosis, clubbing, or edema bilateral lower extremities  CBC: Recent Labs  Lab 12/10/23 1655 12/11/23 0455 12/12/23 0513  WBC 8.6 7.8 7.1  NEUTROABS 6.9  --   --   HGB 14.8 14.0 13.0  HCT 42.0 40.9 38.8*  MCV 97.0 99.3 100.0  PLT 182 179 169   Basic Metabolic Panel: Recent Labs  Lab 12/10/23 1655 12/11/23 0455 12/12/23 0513  NA 139 140 134*  K 3.2* 4.2 3.6  CL 101 102 100  CO2 19* 28 27  GLUCOSE 108* 132* 122*  BUN 18 17 15   CREATININE 0.87 0.78 0.79  CALCIUM  8.6* 8.6* 8.3*  MG  --   --  1.9   GFR: Estimated Creatinine Clearance: 99.8 mL/min (by C-G formula based on SCr of 0.79 mg/dL).   Scheduled Meds:  ezetimibe  10 mg Oral Daily   lamoTRIgine   200 mg Oral BID   losartan   50 mg Oral Daily   mirtazapine   45 mg Oral QHS   QUEtiapine  25 mg Oral QHS   Continuous Infusions:  dextrose  5 % and 0.9 % NaCl with KCl 20 mEq/L 75 mL/hr at 12/11/23 2111     LOS: 2 days    Reyes IVAR Moores, MD [facility] Office  [phone] Pager - Text Page per Tracey  If 7PM-7AM, please contact night-coverage per Amion 12/12/2023, 8:59 AM
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
## 2023-12-13 DIAGNOSIS — S12000A Unspecified displaced fracture of first cervical vertebra, initial encounter for closed fracture: Secondary | ICD-10-CM | POA: Diagnosis not present

## 2023-12-13 DIAGNOSIS — I7774 Dissection of vertebral artery: Secondary | ICD-10-CM

## 2023-12-13 NOTE — Progress Notes (Signed)
 Trauma Event Note    Rounding on pt for [facility]- pt's [place] J appeared to need adjusted. Noticed that the chin support was broken, Will Replace with new [place] J.   Last imported Vital Signs BP (!) 150/64 (BP Location: Right Arm)   Pulse (!) 58   Temp 97.9 F (36.6 C) (Oral)   Resp 16   Ht 6' 1 (1.854 Jose)   Wt 223 lb 1.7 oz (101.2 kg)   SpO2 97%   BMI 29.44 kg/Jose   Trending CBC Recent Labs    12/10/23 1655 12/11/23 0455 12/12/23 0513  WBC 8.6 7.8 7.1  HGB 14.8 14.0 13.0  HCT 42.0 40.9 38.8*  PLT 182 179 169    Trending Coag's Recent Labs    12/10/23 1655  INR 1.0    Trending BMET Recent Labs    12/10/23 1655 12/11/23 0455 12/12/23 0513  NA 139 140 134*  K 3.2* 4.2 3.6  CL 101 102 100  CO2 19* 28 27  BUN 18 17 15   CREATININE 0.87 0.78 0.79  GLUCOSE 108* 132* 122*      [name] [name]  Trauma Response RN  Please call TRN at [phone] for further assistance.

## 2023-12-13 NOTE — Plan of Care (Signed)
  Problem: Education: Goal: Knowledge of General Education information will improve Description: Including pain rating scale, medication(s)/side effects and non-pharmacologic comfort measures Outcome: Progressing   Problem: Health Behavior/Discharge Planning: Goal: Ability to manage health-related needs will improve Outcome: Progressing   Problem: Activity: Goal: Risk for activity intolerance will decrease Outcome: Progressing   Problem: Nutrition: Goal: Adequate nutrition will be maintained Outcome: Progressing   Problem: Coping: Goal: Level of anxiety will decrease Outcome: Progressing   Problem: Elimination: Goal: Will not experience complications related to bowel motility Outcome: Progressing Goal: Will not experience complications related to urinary retention Outcome: Progressing   Problem: Pain Managment: Goal: General experience of comfort will improve and/or be controlled Outcome: Progressing   Problem: Safety: Goal: Ability to remain free from injury will improve Outcome: Progressing

## 2023-12-13 NOTE — Progress Notes (Signed)
    Providing Compassionate, Quality Care - Together   NEUROSURGERY PROGRESS NOTE     S: Confused overnight per EMR review. Improved this AM.    O: EXAM:  BP (!) 150/64 (BP Location: Right Arm)   Pulse (!) 58   Temp 97.9 F (36.6 C) (Oral)   Resp 16   Ht 6' 1 (1.854 m)   Wt 101.2 kg   SpO2 97%   BMI 29.44 kg/m     Awake, alert, oriented Speech fluent, appropriate CN grossly intact 5/5 BUE/BLE SILTx4 Collar in place   ASSESSMENT:  73 y.o. with   -C1 burst fracture -C2 R facet fx with likely V3 dissection  PLAN: -Continue cervical collar -Continue supportive care -Call w/ questions/concerns.   Camie Pickle, [facility]

## 2023-12-13 NOTE — Evaluation (Signed)
 Physical Therapy Evaluation Patient Details Name: [name] MRN: [id number] DOB: 25-Jul-1950 Today's Date: 12/13/2023  History of Present Illness  Pt is a 73 y.o. M who presents 12/10/2023 after a fall at home on his deck. Found to have C1 burst fx, C2 R facet fx with likely V3 dissection. Plan for trial of cervical hard collar and pain control prior to considering operative intervention. Significant PMH: HTN, R knee arthroscopy, CAD, stroke, TAA, severe recurrent major depression.  Clinical Impression  Pt admitted with an unprecedented fall with C1 burst fx and C2 R facet fx. PTA, pt lives with his spouse, for whom he is the primary caregiver. Pt reports 5/10 cervical pain at rest, increases to 8/10 with movement. Instruction provided for log roll technique to transition to edge of bed. Pt transferred to standing and ambulating 40 ft with a RW and CGA. Further distance limited due to dizziness and pain. Pt with mild LUE weakness, but reports this is baseline due to prior stroke. Suspect steady progress given pain control and management. Will benefit from intensive post acute rehabilitation to advance to modI with ADL's and mobility.        If plan is discharge home, recommend the following: A little help with walking and/or transfers;A little help with bathing/dressing/bathroom;Assistance with cooking/housework;Assist for transportation;Help with stairs or ramp for entrance   Can travel by private vehicle        Equipment Recommendations Rolling walker (2 wheels)  Recommendations for Other Services  Rehab consult;OT consult    Functional Status Assessment Patient has had a recent decline in their functional status and demonstrates the ability to make significant improvements in function in a reasonable and predictable amount of time.     Precautions / Restrictions Precautions Precautions: Fall;Cervical Precaution Booklet Issued: Yes (comment) Recall of Precautions/Restrictions:  Intact Required Braces or Orthoses: Cervical Brace Cervical Brace: Hard collar;At all times Restrictions Weight Bearing Restrictions Per Provider Order: No      Mobility  Bed Mobility Overal bed mobility: Needs Assistance Bed Mobility: Rolling, Sidelying to Sit, Sit to Sidelying Rolling: Supervision Sidelying to sit: Contact guard assist     Sit to sidelying: Min assist General bed mobility comments: Verbal cues for log roll technique, increased time, minA for LE negotiation back into bed    Transfers Overall transfer level: Needs assistance Equipment used: Rolling walker (2 wheels) Transfers: Sit to/from Stand Sit to Stand: Contact guard assist           General transfer comment: Verbal cues for hand placement to transition from sit to stand    Ambulation/Gait Ambulation/Gait assistance: Contact guard assist Gait Distance (Feet): 40 Feet Assistive device: Rolling walker (2 wheels) Gait Pattern/deviations: Step-through pattern, Decreased stride length Gait velocity: decreased     General Gait Details: Verbal instruction for walker use and monitoring for activity tolerance, + dizziness  Stairs            Wheelchair Mobility     Tilt Bed    Modified Rankin (Stroke Patients Only)       Balance Overall balance assessment: Needs assistance Sitting-balance support: Feet supported Sitting balance-Leahy Scale: Fair     Standing balance support: Bilateral upper extremity supported Standing balance-Leahy Scale: Poor Standing balance comment: reliant on RW                             Pertinent Vitals/Pain Pain Assessment Pain Assessment: 0-10 Pain Score: 5  Pain Location: 5/10 at rest, increases to 8/10 with movement Pain Descriptors / Indicators: Grimacing, Guarding Pain Intervention(s): Limited activity within patient's tolerance, Monitored during session, Premedicated before session    Home Living Family/patient expects to be  discharged to:: Private residence Living Arrangements: Spouse/significant other Available Help at Discharge: Family;Available 24 hours/day Type of Home: House Home Access: Ramped entrance     Alternate Level Stairs-Number of Steps:  (stair lift) Home Layout: Two level;Able to live on main level with bedroom/bathroom Home Equipment: Toilet riser;Cane - single point;Grab bars - tub/shower Additional Comments: Pt spouse with stage IV lung CA on hospice; he is typically caregiver. Hospice RN only there every 2 weeks. Pt daughter flew in from [place] to help out    Prior Function Prior Level of Function : Independent/Modified Independent;Driving                     Extremity/Trunk Assessment   Upper Extremity Assessment Upper Extremity Assessment: RUE deficits/detail;LUE deficits/detail RUE Deficits / Details: Grossly 4/5 LUE Deficits / Details: Grossly 4-/5 (pt reports residual deficits from prior stroke)    Lower Extremity Assessment Lower Extremity Assessment: Overall WFL for tasks assessed    Cervical / Trunk Assessment Cervical / Trunk Assessment: Other exceptions Cervical / Trunk Exceptions: C1 burst fx, C2 R facet fx  Communication   Communication Communication: No apparent difficulties    Cognition Arousal: Alert Behavior During Therapy: WFL for tasks assessed/performed   PT - Cognitive impairments: No apparent impairments                         Following commands: Intact       Cueing Cueing Techniques: Verbal cues     General Comments      Exercises     Assessment/Plan    PT Assessment Patient needs continued PT services  PT Problem List Decreased strength;Decreased activity tolerance;Decreased balance;Decreased mobility;Pain       PT Treatment Interventions DME instruction;Gait training;Functional mobility training;Therapeutic exercise;Therapeutic activities;Balance training;Patient/family education    PT Goals (Current goals can be  found in the Care Plan section)  Acute Rehab PT Goals Patient Stated Goal: return to independence PT Goal Formulation: With patient Time For Goal Achievement: 12/27/23 Potential to Achieve Goals: Good    Frequency Min 3X/week     Co-evaluation               AM-PAC PT 6 Clicks Mobility  Outcome Measure Help needed turning from your back to your side while in a flat bed without using bedrails?: A Little Help needed moving from lying on your back to sitting on the side of a flat bed without using bedrails?: A Little Help needed moving to and from a bed to a chair (including a wheelchair)?: A Little Help needed standing up from a chair using your arms (e.g., wheelchair or bedside chair)?: A Little Help needed to walk in hospital room?: A Little Help needed climbing 3-5 steps with a railing? : A Lot 6 Click Score: 17    End of Session Equipment Utilized During Treatment: Gait belt;Cervical collar Activity Tolerance: Patient tolerated treatment well Patient left: in bed;with call bell/phone within reach;with bed alarm set;with SCD's reapplied;Other (comment) (declined sitting up in chair) Nurse Communication: Mobility status PT Visit Diagnosis: Unsteadiness on feet (R26.81);History of falling (Z91.81);Difficulty in walking, not elsewhere classified (R26.2);Pain Pain - part of body:  (neck)    Time: 1101-1130 PT Time Calculation (min) (ACUTE  ONLY): 29 min   Charges:   PT Evaluation $PT Eval Low Complexity: 1 Low PT Treatments $Therapeutic Activity: 8-22 mins PT General Charges $$ ACUTE PT VISIT: 1 Visit         Aleck Daring, PT, DPT Acute Rehabilitation Services Office [phone]   Aleck ONEIDA Daring 12/13/2023, 11:50 AM

## 2023-12-13 NOTE — Progress Notes (Signed)
 Transition of Care [facility]) - CAGE-AID Screening   Patient Details  Name: [name] MRN: [id number] Date of Birth: [birth year]   Darice CHRISTELLA Rouleau, RN Trauma Response Nurse Phone Number: [phone] 12/13/2023, 2:34 PM      CAGE-AID Screening:    Have You Ever Felt You Ought to Cut Down on Your Drinking or Drug Use?: No Have People Annoyed You By Critizing Your Drinking Or Drug Use?: No Have You Felt Bad Or Guilty About Your Drinking Or Drug Use?: No Have You Ever Had a Drink or Used Drugs First Thing In The Morning to Steady Your Nerves or to Get Rid of a Hangover?: No CAGE-AID Score: 0  Substance Abuse Education Offered: (S) No (Pt does admit to drinking 2-3 drinks a day-- declines any services)

## 2023-12-13 NOTE — Progress Notes (Signed)
 [name]  FMW:[id number] DOB: Feb 03, 1951 DOA: 12/10/2023 PCP: Bluford Jacqulyn MATSU, DO    Brief Narrative:  73 year old with a history of anxiety/depression, osteoarthritis, HTN, PFO, and OSA who presented to the [facility] ER 10/23 after suffering a mechanical fall in which he tripped while on his deck and then fell head forward into a post striking the vertex of his head with great force.  At the time of his presentation he initially was experiencing numbness in his feet and right arm, but fortunately this resolved during his stay in the ER.  CT cervical spine at presentation confirmed a Jefferson fracture of C1 as well as mildly displaced fractures of the superior and inferior facets of C2. CTa of the head noted only irregularity of the right V3 vertebral artery with intramural filling defect compatible with acute arterial injury with intraluminal thrombus and/or dissection.  His case was discussed with Neurosurgery and the decision was made to transfer him to [facility] for definitive evaluation.  Goals of Care:   Code Status: Full Code   DVT prophylaxis: SCDs Start: 12/10/23 2257   Interim Hx: The patient encountered difficulty with acute delirium yesterday which complicated his care.  There were no acute events reported overnight.  He is afebrile with stable vital signs.  Patient is alert and conversant the time of visit.  He states his pain is reasonably well-controlled.  He denies chest pain or shortness of breath.  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 Kerrin in the outpatient setting w/ annual screening   Family Communication: No family present at time of exam Disposition: Unclear presently   Objective: Blood pressure 120/72, pulse (!) 59, temperature 97.8 F (36.6 C), temperature source Oral, resp. rate 18, height 6' 1 (1.854 m), weight 101.2 kg, SpO2 100%.  Intake/Output Summary (Last 24 hours) at 12/13/2023 0809 Last data filed at 12/12/2023 2030 Gross per 24 hour  Intake 1432.37 ml  Output --  Net 1432.37 ml   Filed Weights   12/10/23 1557  Weight: 101.2 kg    Examination: General: No acute respiratory distress Lungs: Clear to auscultation bilaterally Cardiovascular: Regular rate and rhythm without murmur  Abdomen: Nontender, nondistended, soft, bowel sounds positive, no rebound Extremities: No significant edema bilateral lower extremities  CBC: Recent Labs  Lab 12/10/23 1655 12/11/23 0455 12/12/23 0513  WBC 8.6 7.8 7.1  NEUTROABS 6.9  --   --   HGB 14.8 14.0 13.0  HCT 42.0 40.9 38.8*  MCV 97.0 99.3 100.0  PLT 182 179 169   Basic Metabolic Panel: Recent Labs  Lab 12/10/23 1655 12/11/23 0455 12/12/23 0513  NA 139 140 134*  K 3.2* 4.2 3.6  CL 101 102 100  CO2 19* 28 27  GLUCOSE 108* 132* 122*  BUN 18 17 15   CREATININE 0.87 0.78 0.79  CALCIUM  8.6* 8.6* 8.3*  MG  --   --  1.9   GFR: Estimated Creatinine Clearance: 99.8 mL/min (by C-G formula based on SCr of 0.79 mg/dL).   Scheduled Meds:  ezetimibe  10 mg Oral Daily   ketorolac   15 mg Intravenous Q8H   lamoTRIgine   200 mg Oral BID   losartan   50 mg Oral Daily   mirtazapine   45 mg Oral QHS   pantoprazole   40 mg Oral Daily   QUEtiapine  50 mg Oral QHS     LOS: 3 days   Reyes IVAR Moores, MD [facility] Office  [phone] Pager - Text  Page per Tracey  If 7PM-7AM, please contact night-coverage per Amion 12/13/2023, 8:09 AM

## 2023-12-13 NOTE — Progress Notes (Signed)
 Pt agitated this AM about not receiving his pain medication on previous shift. Per night shift RN, pt called out several times for pain medication throughout the night and when she would walk in, pt would be asleep. Pt was also fidgety with his collar (per PM RN), to which he said he wasn't, so I'm not sure if he was asleep and doing it or a little confused during the night.   This AM, he got to the edge of the bed and stood up to set his alarm off so someone would come in. I explained to him that he needed to use the call light and not try to get up unassisted. I then told him I would give him his scheduled pain medication, to which he asked what is it?. I explained it was his toradol , to which he replied if oxy doesn't help why would toradol ?. I educated the patient on the continuous use of dilaudid , and how it is making him too confused when he gets it too often and were trying to use other means of pain control in collaboration with the dilaudid . I told him id reassess his pain one hour after the toradol  and see where he was at. I told him we didn't want him to hurt, but we also can't continuously give him medications that are causing confusion and increasing his O2 demand.This AM he isnt confused, and understood he can't get up without assistance, and that we can't give him dilaudid  every 2 hours due to his confusion, MAR order change, and respiratory depression. Pt verbalized he understood all pain management measures.

## 2023-12-13 NOTE — Progress Notes (Signed)

## 2023-12-14 ENCOUNTER — Telehealth: Payer: Self-pay | Admitting: Orthopedic Surgery

## 2023-12-14 ENCOUNTER — Inpatient Hospital Stay (HOSPITAL_COMMUNITY)

## 2023-12-14 DIAGNOSIS — S12000A Unspecified displaced fracture of first cervical vertebra, initial encounter for closed fracture: Secondary | ICD-10-CM | POA: Diagnosis not present

## 2023-12-14 DIAGNOSIS — I7774 Dissection of vertebral artery: Secondary | ICD-10-CM | POA: Diagnosis not present

## 2023-12-14 MED ORDER — FENTANYL CITRATE (PF) 50 MCG/ML IJ SOSY: 12.5000 ug | PREFILLED_SYRINGE | INTRAMUSCULAR | Status: DC | PRN

## 2023-12-14 MED ORDER — LACTULOSE 10 GM/15ML PO SOLN: 30.0000 g | Freq: Two times a day (BID) | ORAL | Status: DC | PRN

## 2023-12-14 MED ORDER — POLYETHYLENE GLYCOL 3350 17 G PO PACK
17.0000 g | PACK | Freq: Two times a day (BID) | ORAL | Status: DC
  Administered 2023-12-14 – 2023-12-17 (×4): 17 g via ORAL
  Filled 2023-12-14 (×5): qty 1

## 2023-12-14 MED ORDER — SENNOSIDES-DOCUSATE SODIUM 8.6-50 MG PO TABS
1.0000 | ORAL_TABLET | Freq: Two times a day (BID) | ORAL | Status: DC
  Administered 2023-12-14 – 2023-12-17 (×4): 1 via ORAL
  Filled 2023-12-14 (×5): qty 1

## 2023-12-14 NOTE — Progress Notes (Addendum)
 Virtual RN completed outstanding admission documentation with patient. Notified floor RN. Patient shared he is overwhelmed with the current change in his health status as he cares for his wife. Offered to place order to have chaplain visit. Patient has established therapist relationship and will reach out.

## 2023-12-14 NOTE — Progress Notes (Signed)
 Inpatient Rehab Admissions Coordinator:   Met with patient at bedside to discuss CIR. Patient has wife at home that is on hospice care. His goal is to get home as soon as possible to assist her. He is not interested in CIR at this time. Other discharge avenues will need to be looked into.   Rehab Admissons Coordinator Chealsey Miyamoto, [place], [place] [phone]

## 2023-12-14 NOTE — Progress Notes (Signed)
 [name]  FMW:[id number] DOB: [birth year] DOA: 12/10/2023 PCP: Bluford Jacqulyn MATSU, DO    Brief Narrative:  73 year old with a history of anxiety/depression, osteoarthritis, HTN, PFO, and OSA who presented to the [facility] ER 10/23 after suffering a mechanical fall in which he tripped while on his deck and then fell head forward into a post striking the vertex of his head with great force.  At the time of his presentation he initially was experiencing numbness in his feet and right arm, but fortunately this resolved during his stay in the ER.  CT cervical spine at presentation confirmed a Jefferson fracture of C1 as well as mildly displaced fractures of the superior and inferior facets of C2. CTa of the head noted only irregularity of the right V3 vertebral artery with intramural filling defect compatible with acute arterial injury with intraluminal thrombus and/or dissection.  His case was discussed with Neurosurgery and the decision was made to transfer him to [facility] for definitive evaluation.  Goals of Care:   Code Status: Full Code   DVT prophylaxis: SCDs Start: 12/10/23 2257   Interim Hx: There was reportedly concern for excessive respiratory depression with use of Dilaudid  last night, therefore it was discontinued.  Vital signs are stable.  The patient is afebrile.  CXR was accomplished due to his desaturations and was unrevealing.  He is resting quietly in bed at time of my visit.  He denies any current shortness of breath or chest pain.  He reports that his neck pain is not well-controlled at present but he is trying his best to tolerate it.  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 Kerrin in the outpatient setting w/ annual screening   Family Communication: No family present at time of exam Disposition: Unclear presently Acute Inpatient Rehab (3hours/Day)12/13/2023 1105  Objective: Blood pressure (!) 158/76, pulse 68, temperature 98.2 F (36.8 C), temperature source Oral, resp. rate 18, height 6' 1 (1.854 m), weight 101.2 kg, SpO2 95%.  Intake/Output Summary (Last 24 hours) at 12/14/2023 0800 Last data filed at 12/14/2023 0400 Gross per 24 hour  Intake --  Output 1200 ml  Net -1200 ml   Filed Weights   12/10/23 1557  Weight: 101.2 kg    Examination: General: No acute respiratory distress Lungs: Clear to auscultation bilaterally Cardiovascular: Regular rate and rhythm without murmur  Abdomen: Nontender, nondistended, soft, bowel sounds positive, no rebound Extremities: No significant edema B LE   CBC: Recent Labs  Lab 12/10/23 1655 12/11/23 0455 12/12/23 0513  WBC 8.6 7.8 7.1  NEUTROABS 6.9  --   --   HGB 14.8 14.0 13.0  HCT 42.0 40.9 38.8*  MCV 97.0 99.3 100.0  PLT 182 179 169   Basic Metabolic Panel: Recent Labs  Lab 12/10/23 1655 12/11/23  0455 12/12/23 0513  NA 139 140 134*  K 3.2* 4.2 3.6  CL 101 102 100  CO2 19* 28 27  GLUCOSE 108* 132* 122*  BUN 18 17 15   CREATININE 0.87 0.78 0.79  CALCIUM  8.6* 8.6* 8.3*  MG  --   --  1.9   GFR: Estimated Creatinine Clearance: 99.8 mL/min (by C-G formula based on SCr of 0.79 mg/dL).   Scheduled Meds:  ezetimibe  10 mg Oral  Daily   lamoTRIgine   200 mg Oral BID   losartan   50 mg Oral Daily   mirtazapine   45 mg Oral QHS   pantoprazole   40 mg Oral Daily   QUEtiapine  50 mg Oral QHS     LOS: 4 days   Reyes IVAR Moores, MD [facility] Office  [phone] Pager - Text Page per Tracey  If 7PM-7AM, please contact night-coverage per Amion 12/14/2023, 8:00 AM

## 2023-12-14 NOTE — Care Management Important Message (Signed)
 Important Message  Patient Details  Name: [name] MRN: [id number] Date of Birth: [birth year]   Important Message Given:  Yes - Medicare IM     Claretta Deed 12/14/2023, 3:28 PM

## 2023-12-14 NOTE — Evaluation (Signed)
 Occupational Therapy Evaluation Patient Details Name: [name] MRN: [id number] DOB: 1950/11/27 Today's Date: 12/14/2023   History of Present Illness   Pt is a 73 y.o. M who presents 12/10/2023 after a fall at home on his deck. Found to have C1 burst fx, C2 R facet fx with likely V3 dissection. Plan for trial of cervical hard collar and pain control prior to considering operative intervention. Significant PMH: HTN, R knee arthroscopy, CAD, stroke, TAA, severe recurrent major depression.     Clinical Impressions PTA, pt lived with his wife whom he serves as her primary caregiver. Upon eval, pt with cervical pain exacerbated by mobility limiting activity tolerance this session. Pt educated regarding cervical precautions, needing up to min A for LB ADL due to difficulty maintaining figure 4 while using BUE for LB dressing. Pt additionally needing DME and CGA for short distance mobility; previously no AD at baseline. Pt with BUE weakness L>R, but pt reports L residual weakness from CVA in 2010. Pt to continue to benefit from acute OT services; due to significant change in status, rehab potential, and to facilitate return to prior roles/routines, highly recommending intensive multidisciplinary inpatient rehabilitation prior to dc home.      If plan is discharge home, recommend the following:   A little help with walking and/or transfers;A little help with bathing/dressing/bathroom;Assistance with cooking/housework;Assist for transportation;Help with stairs or ramp for entrance     Functional Status Assessment   Patient has had a recent decline in their functional status and demonstrates the ability to make significant improvements in function in a reasonable and predictable amount of time.     Equipment Recommendations   BSC/3in1;Other (comment) (RW)     Recommendations for Other Services   Rehab consult     Precautions/Restrictions   Precautions Precautions:  Fall;Cervical Precaution Booklet Issued: Yes (comment) Recall of Precautions/Restrictions: Intact Required Braces or Orthoses: Cervical Brace Cervical Brace: Hard collar;At all times Restrictions Weight Bearing Restrictions Per Provider Order: No     Mobility Bed Mobility Overal bed mobility: Needs Assistance Bed Mobility: Rolling, Sidelying to Sit, Sit to Sidelying Rolling: Supervision Sidelying to sit: Contact guard assist     Sit to sidelying: Contact guard assist General bed mobility comments: Verbal cues for log roll technique, increased time    Transfers Overall transfer level: Needs assistance Equipment used: Rolling walker (2 wheels) Transfers: Sit to/from Stand Sit to Stand: Contact guard assist           General transfer comment: Verbal cues for hand placement to transition from sit to stand and for descent      Balance Overall balance assessment: Needs assistance Sitting-balance support: Feet supported Sitting balance-Leahy Scale: Fair     Standing balance support: Bilateral upper extremity supported Standing balance-Leahy Scale: Poor Standing balance comment: reliant on RW                           ADL either performed or assessed with clinical judgement   ADL Overall ADL's : Needs assistance/impaired Eating/Feeding: Modified independent;Sitting   Grooming: Standing;Contact guard assist   Upper Body Bathing: Set up;Sitting   Lower Body Bathing: Minimal assistance;Sit to/from stand   Upper Body Dressing : Set up;Sitting   Lower Body Dressing: Minimal assistance;Sit to/from stand   Toilet Transfer: Contact guard assist;Ambulation;Rolling walker (2 wheels)           Functional mobility during ADLs: Contact guard assist;Rolling walker (2 wheels)  Vision Baseline Vision/History: 1 Wears glasses Ability to See in Adequate Light: 0 Adequate Patient Visual Report: No change from baseline Vision Assessment?: No apparent  visual deficits     Perception Perception: Not tested       Praxis Praxis: Not tested       Pertinent Vitals/Pain Pain Assessment Pain Assessment: Faces Faces Pain Scale: Hurts even more Pain Location: neck Pain Descriptors / Indicators: Grimacing, Guarding Pain Intervention(s): Limited activity within patient's tolerance, Monitored during session     Extremity/Trunk Assessment Upper Extremity Assessment Upper Extremity Assessment: Right hand dominant;RUE deficits/detail;LUE deficits/detail RUE Deficits / Details: Grossly 4/5 LUE Deficits / Details: Grossly 4-/5 (pt reports residual deficits from prior stroke)   Lower Extremity Assessment Lower Extremity Assessment: Defer to PT evaluation   Cervical / Trunk Assessment Cervical / Trunk Assessment: Other exceptions Cervical / Trunk Exceptions: C1 burst fx, C2 R facet fx   Communication Communication Communication: No apparent difficulties   Cognition Arousal: Alert Behavior During Therapy: WFL for tasks assessed/performed Cognition: No apparent impairments                               Following commands: Intact       Cueing  General Comments   Cueing Techniques: Verbal cues  pt limited by pain   Exercises     Shoulder Instructions      Home Living Family/patient expects to be discharged to:: Private residence Living Arrangements: Spouse/significant other Available Help at Discharge: Family;Available 24 hours/day Type of Home: House Home Access: Ramped entrance     Home Layout: Two level;Able to live on main level with bedroom/bathroom Alternate Level Stairs-Number of Steps:  (stair lift)   Bathroom Shower/Tub: Chief Strategy Officer: Standard     Home Equipment: Toilet riser;Cane - single point;Grab bars - tub/shower   Additional Comments: Pt spouse with stage IV lung CA on hospice; he is typically caregiver. Hospice RN only there every 2 weeks. Pt daughter flew in from [place]  to help out but can only stay a short time      Prior Functioning/Environment Prior Level of Function : Independent/Modified Independent;Driving                    OT Problem List: Decreased strength;Decreased activity tolerance;Impaired balance (sitting and/or standing);Decreased knowledge of use of DME or AE;Decreased knowledge of precautions;Pain   OT Treatment/Interventions: Self-care/ADL training;Therapeutic exercise;DME and/or AE instruction;Therapeutic activities;Patient/family education;Balance training      OT Goals(Current goals can be found in the care plan section)   Acute Rehab OT Goals Patient Stated Goal: get better OT Goal Formulation: With patient Time For Goal Achievement: 12/28/23 Potential to Achieve Goals: Good   OT Frequency:  Min 2X/week    Co-evaluation              AM-PAC OT 6 Clicks Daily Activity     Outcome Measure Help from another person eating meals?: None Help from another person taking care of personal grooming?: A Little Help from another person toileting, which includes using toliet, bedpan, or urinal?: A Little Help from another person bathing (including washing, rinsing, drying)?: A Little Help from another person to put on and taking off regular upper body clothing?: A Little Help from another person to put on and taking off regular lower body clothing?: A Little 6 Click Score: 19   End of Session Equipment Utilized During Treatment: Rolling walker (  2 wheels);Cervical collar Nurse Communication: Mobility status  Activity Tolerance: Patient tolerated treatment well Patient left: in bed;with call bell/phone within reach;with bed alarm set  OT Visit Diagnosis: Unsteadiness on feet (R26.81);Muscle weakness (generalized) (M62.81);Pain                Time: 9098-9078 OT Time Calculation (min): 20 min Charges:  OT General Charges $OT Visit: 1 Visit OT Evaluation $OT Eval Low Complexity: 1 Low  [name] FREDERICK,  OTR/L [facility] Acute Rehabilitation Office: [phone]   [name] 12/14/2023, 9:33 AM

## 2023-12-14 NOTE — Telephone Encounter (Signed)
 Dr. Areatha pt - pt's spouse lvm stating that the pt took a bad fall last Thursday and is in [facility] hospital.  She stated he broke his C1 & C2 vertebra.  She wanted to let you know so his surgery w/you can be cancelled.  [phone]

## 2023-12-14 NOTE — Plan of Care (Signed)

## 2023-12-14 NOTE — Progress Notes (Signed)
 Subjective: The patient is alert and pleasant.  He complains of neck pain.  He is wearing his hard collar as directed.  He is anxious to get home to take care of his wife who has stage IV lung cancer and is getting a home hospice care.  Objective: Vital signs in last 24 hours: Temp:  [97.9 F (36.6 C)-98.7 F (37.1 C)] 97.9 F (36.6 C) (10/27 1217) Pulse Rate:  [60-76] 61 (10/27 1217) Resp:  [18-20] 18 (10/27 1217) BP: (131-166)/(72-90) 160/87 (10/27 1217) SpO2:  [95 %-99 %] 99 % (10/27 0835) Estimated body mass index is 29.44 kg/m as calculated from the following:   Height as of this encounter: 6' 1 (1.854 m).   Weight as of this encounter: 101.2 kg.   Intake/Output from previous day: 10/26 0701 - 10/27 0700 In: -  Out: 1200 [Urine:1200] Intake/Output this shift: No intake/output data recorded.  Physical exam the patient is alert and oriented.  He is moving all 4 extremities well.  Lab Results: Recent Labs    12/12/23 0513  WBC 7.1  HGB 13.0  HCT 38.8*  PLT 169   BMET Recent Labs    12/12/23 0513  NA 134*  K 3.6  CL 100  CO2 27  GLUCOSE 122*  BUN 15  CREATININE 0.79  CALCIUM  8.3*    Studies/Results: DG CHEST PORT 1 VIEW Result Date: 12/14/2023 EXAM: 1 VIEW XRAY OF THE CHEST 12/14/2023 04:00:14 AM COMPARISON: None available. CLINICAL HISTORY: Hypoxia 200808. Images from the original note were not included.; Hypoxia 200808 FINDINGS: LUNGS AND PLEURA: Low lung volumes. Coarsened interstitial markings. No confluent pulmonary infiltrate. No pleural effusion. No pneumothorax. HEART AND MEDIASTINUM: Cardiomegaly. Atherosclerotic calcifications. BONES AND SOFT TISSUES: No acute osseous abnormality. IMPRESSION: 1. No acute findings. 2. Cardiomegaly and atherosclerotic calcifications. Electronically signed by: Dorethia Molt MD 12/14/2023 04:08 AM EDT RP Workstation: [id number]    Assessment/Plan: C1 Jefferson's fracture, C2 fracture, vertebral artery injury: I  discussed the situation with the patient.  We discussed the various options including continue to wear the collar and hope he heals, Hello, and occipital cervical fusion.  I have explained that halo immobilization and occipital cervical fusion are associated with high morbidity.   I think his best option is to wear the collar.  He agrees.  Hopefully we can get him a home in a few days as requested.  I will plan to follow him as an outpatient with serial x-rays.  If he does not heal we could consider surgery at that point.  LOS: 4 days     [name] 12/14/2023, 12:36 PM     Patient ID: [name], male   DOB: 09/04/1950, 73 y.o.   MRN: [id number]

## 2023-12-14 NOTE — Telephone Encounter (Signed)
 Sent message to cancel surgery

## 2023-12-14 NOTE — TOC Initial Note (Signed)
 [facility]) - Initial/Assessment Note    Patient Details  Name: [name] MRN: [id number] Date of Birth: August 14, 1950  Transition of Care [facility]) CM/SW Contact:    Andrez JULIANNA George, RN Phone Number: 12/14/2023, 12:50 PM  Clinical Narrative:                  [name] is a 73 y.o. male with medical history significant for anxiety, depression, osteoarthritis, essential hypertension and OSA, as well as PFO, who presented to the emergency room with acute onset of fall after tripping on his deck and subsequent head injury and neck pain.   Pt is from home with his wife who is on hospice services with [facility]. Pt has been providing her care at home. Pts daughter is currently at the home to assist with the patients wife.  Pt is refusing CIR and wants to dc home with home health services when medically ready. Pt still on oxygen and bedside RN is trying to wean the oxygen.  Choice for [facility] provided and [facility] decided on. Information on the AVS. [facility] will contact him for the first home visit. Pt denies the need for [facility] or walker as they have these DME at home.  IP Care management following. Expected Discharge Plan: Home w Home Health Services Barriers to Discharge: Continued Medical Work up   Patient Goals and CMS Choice   CMS Medicare.gov Compare Post Acute Care list provided to:: Patient Choice offered to / list presented to : Patient      Expected Discharge Plan and Services   Discharge Planning Services: CM Consult Post Acute Care Choice: Home Health Living arrangements for the past 2 months: Single Family Home                           HH Arranged: OT, PT HH Agency: [facility] ([facility]) Date HH Agency Contacted: 12/14/23   Representative spoke with at [facility] Agency: [facility]  Prior Living Arrangements/Services Living arrangements for the past 2 months: Single Family Home Lives with:: Spouse Patient language and need for interpreter  reviewed:: Yes Do you feel safe going back to the place where you live?: Yes        Care giver support system in place?: No (comment) Current home services: DME (walkers/ canes/ shower seats) Criminal Activity/Legal Involvement Pertinent to Current Situation/Hospitalization: No - Comment as needed  Activities of Daily Living   ADL Screening (condition at time of admission) Independently performs ADLs?: Yes (appropriate for developmental age) Is the patient deaf or have difficulty hearing?: No Does the patient have difficulty seeing, even when wearing glasses/contacts?: No Does the patient have difficulty concentrating, remembering, or making decisions?: No  Permission Sought/Granted                  Emotional Assessment Appearance:: Appears stated age Attitude/Demeanor/Rapport: Engaged Affect (typically observed): Accepting Orientation: : Oriented to Self, Oriented to Place, Oriented to  Time, Oriented to Situation   Psych Involvement: No (comment)  Admission diagnosis:  Vertebral artery dissection [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.74] Closed cervical spine fracture (HCC) [S12.9XXA] Fall, initial encounter [id number]] Closed displaced fracture of first cervical vertebra, unspecified fracture morphology, initial encounter [facility]) [S12.000A] Patient Active Problem List   Diagnosis Date Noted   Dyslipidemia 12/11/2023   Anxiety and depression 12/11/2023   Closed cervical spine fracture (HCC) 12/10/2023   Lower extremity edema 09/20/2023   History of gastric ulcer 08/31/2023   S/P arthroscopy  of right knee 09/09/22 09/11/2022   Primary osteoarthritis of right knee 09/09/2022   CAD (coronary artery disease) 07/21/2022   Prediabetes 08/12/2021   Sleep apnea 08/12/2021   Multiple thyroid  nodules 08/12/2021   Anxiety 06/13/2021   History of stroke 06/12/2021   Hyperlipidemia 06/12/2021   Thoracic aortic aneurysm without rupture 11/16/2018   Severe recurrent major depression without psychotic features  (HCC) 06/20/2016   Benzodiazepine abuse in remission (HCC) 03/28/2016   Essential hypertension 06/07/2008   PCP:  Cook, Jayce G, DO Pharmacy:   [facility] - [place], [place] - [address] [place] [address] Phone: [phone] Fax: [phone]     Social Drivers of Health (SDOH) Social History: SDOH Screenings   Food Insecurity: No Food Insecurity (12/12/2023)  Housing: Low Risk  (12/12/2023)  Transportation Needs: No Transportation Needs (12/12/2023)  Utilities: Not At Risk (12/12/2023)  Alcohol Screen: Low Risk  (10/23/2023)  Depression (PHQ2-9): High Risk (10/23/2023)  Financial Resource Strain: Low Risk  (10/23/2023)  Physical Activity: Sufficiently Active (10/23/2023)  Social Connections: Moderately Isolated (12/12/2023)  Stress: Stress Concern Present (10/23/2023)  Tobacco Use: Medium Risk (12/10/2023)  Health Literacy: Adequate Health Literacy (10/23/2023)   SDOH Interventions:     Readmission Risk Interventions    12/11/2023   10:19 AM  Readmission Risk Prevention Plan  Post Dischage Appt Complete  Medication Screening Complete  Transportation Screening Complete

## 2023-12-14 NOTE — Plan of Care (Signed)
 Sitting at bedside for more comfortable position. Assisted to [facility]. Oxygen in use after episode last PM. Foods and fluids well tolerated.    Problem: Education: Goal: Knowledge of General Education information will improve Description: Including pain rating scale, medication(s)/side effects and non-pharmacologic comfort measures Outcome: Progressing   Problem: Activity: Goal: Risk for activity intolerance will decrease Outcome: Progressing   Problem: Nutrition: Goal: Adequate nutrition will be maintained Outcome: Progressing   Problem: Pain Managment: Goal: General experience of comfort will improve and/or be controlled Outcome: Progressing   Problem: Safety: Goal: Ability to remain free from injury will improve Outcome: Progressing   Problem: Skin Integrity: Goal: Risk for impaired skin integrity will decrease Outcome: Progressing

## 2023-12-15 DIAGNOSIS — S12001G Unspecified nondisplaced fracture of first cervical vertebra, subsequent encounter for fracture with delayed healing: Secondary | ICD-10-CM | POA: Diagnosis not present

## 2023-12-15 LAB — CBC
HCT: 37 % — ABNORMAL LOW (ref 39.0–52.0)
Hemoglobin: 13.1 g/dL (ref 13.0–17.0)
MCH: 33.8 pg (ref 26.0–34.0)
MCHC: 35.4 g/dL (ref 30.0–36.0)
MCV: 95.4 fL (ref 80.0–100.0)
Platelets: 168 K/uL (ref 150–400)
RBC: 3.88 MIL/uL — ABNORMAL LOW (ref 4.22–5.81)
RDW: 11.2 % — ABNORMAL LOW (ref 11.5–15.5)
WBC: 6.9 K/uL (ref 4.0–10.5)
nRBC: 0 % (ref 0.0–0.2)

## 2023-12-15 LAB — BASIC METABOLIC PANEL WITH GFR
Anion gap: 9 (ref 5–15)
BUN: 14 mg/dL (ref 8–23)
CO2: 29 mmol/L (ref 22–32)
Calcium: 8.7 mg/dL — ABNORMAL LOW (ref 8.9–10.3)
Chloride: 98 mmol/L (ref 98–111)
Creatinine, Ser: 0.77 mg/dL (ref 0.61–1.24)
GFR, Estimated: >60 mL/min (ref >60)
Glucose, Bld: 121 mg/dL — ABNORMAL HIGH (ref 70–99)
Potassium: 3.2 mmol/L — ABNORMAL LOW (ref 3.5–5.1)
Sodium: 136 mmol/L (ref 135–145)

## 2023-12-15 LAB — MAGNESIUM: Magnesium: 1.8 mg/dL (ref 1.7–2.4)

## 2023-12-15 MED ORDER — ASPIRIN 325 MG PO TABS
325.0000 mg | ORAL_TABLET | Freq: Every day | ORAL | Status: DC
  Administered 2023-12-15 – 2023-12-17 (×3): 325 mg via ORAL
  Filled 2023-12-15 (×3): qty 1

## 2023-12-15 MED ORDER — POTASSIUM CHLORIDE 20 MEQ PO PACK
20.0000 meq | PACK | Freq: Two times a day (BID) | ORAL | Status: DC
  Administered 2023-12-15 (×2): 20 meq via ORAL
  Filled 2023-12-15 (×5): qty 1

## 2023-12-15 MED ORDER — SALINE SPRAY 0.65 % NA SOLN
1.0000 | NASAL | Status: DC | PRN
  Filled 2023-12-15: qty 44

## 2023-12-15 NOTE — Progress Notes (Signed)
 Subjective: The patient is alert and pleasant.  He complains of some nasal congestion.  He has mobilized with PT using a walker.  Objective: Vital signs in last 24 hours: Temp:  [97.8 F (36.6 C)-98.6 F (37 C)] 98 F (36.7 C) (10/28 0407) Pulse Rate:  [52-64] 52 (10/28 0407) Resp:  [17-20] 18 (10/28 0407) BP: (115-162)/(58-92) 157/92 (10/28 0525) SpO2:  [94 %-99 %] 96 % (10/28 0407) Estimated body mass index is 29.44 kg/m as calculated from the following:   Height as of this encounter: 6' 1 (1.854 m).   Weight as of this encounter: 101.2 kg.   Intake/Output from previous day: 10/27 0701 - 10/28 0700 In: 240 [P.O.:240] Out: 500 [Urine:500] Intake/Output this shift: No intake/output data recorded.  Physical exam the patient is alert and oriented.  He is moving all 4 extremities well.  He is wearing his hard collar.  Lab Results: Recent Labs    12/15/23 0247  WBC 6.9  HGB 13.1  HCT 37.0*  PLT 168   BMET Recent Labs    12/15/23 0247  NA 136  K 3.2*  CL 98  CO2 29  GLUCOSE 121*  BUN 14  CREATININE 0.77  CALCIUM  8.7*    Studies/Results: DG CHEST PORT 1 VIEW Result Date: 12/14/2023 EXAM: 1 VIEW XRAY OF THE CHEST 12/14/2023 04:00:14 AM COMPARISON: None available. CLINICAL HISTORY: Hypoxia 200808. Images from the original note were not included.; Hypoxia 200808 FINDINGS: LUNGS AND PLEURA: Low lung volumes. Coarsened interstitial markings. No confluent pulmonary infiltrate. No pleural effusion. No pneumothorax. HEART AND MEDIASTINUM: Cardiomegaly. Atherosclerotic calcifications. BONES AND SOFT TISSUES: No acute osseous abnormality. IMPRESSION: 1. No acute findings. 2. Cardiomegaly and atherosclerotic calcifications. Electronically signed by: Dorethia Molt MD 12/14/2023 04:08 AM EDT RP Workstation: [id number]    Assessment/Plan: C1 Jefferson fracture, C2 fracture, vertebral artery injury: The patient is progressing well.  I will start aspirin  for his vertebral artery  injury and saline nasal spray for his congestion.  Hopefully we can get him home soon.  I will plan to have him follow-up in the office for serial x-rays.  The alternative is a halo or occipital cervical fusion which do not seem like great ideas.  LOS: 5 days     [name] 12/15/2023, 7:34 AM     Patient ID: [name], male   DOB: 1950/05/24, 73 y.o.   MRN: [id number]

## 2023-12-15 NOTE — Plan of Care (Signed)
   Problem: Education: Goal: Knowledge of General Education information will improve Description Including pain rating scale, medication(s)/side effects and non-pharmacologic comfort measures Outcome: Progressing   Problem: Health Behavior/Discharge Planning: Goal: Ability to manage health-related needs will improve Outcome: Progressing

## 2023-12-15 NOTE — Progress Notes (Signed)
 Physical Therapy Treatment Patient Details Name: [name] MRN: [id number] DOB: 1950-02-25 Today's Date: 12/15/2023   History of Present Illness Pt is a 73 y.o. M who presents 12/10/2023 after a fall at home on his deck. Found to have C1 burst fx, C2 R facet fx with likely V3 dissection. Plan for trial of cervical hard collar and pain control prior to considering operative intervention. Significant PMH: HTN, R knee arthroscopy, CAD, stroke, TAA, severe recurrent major depression.    PT Comments  Pt received supine in bed and agreeable to PT session. Today's session focused namely on improving ambulation tolerance and further precaution and mobility education. Pt able to perform supine>sit and STS with no physical assist today, only CGA for safety. Pt demonstrated proper log roll technique with bed mobility without being cued to do so. Able to ambulate 200 ft with RW and CGA, an improvement from previous session (which was 79ft, limited by pain). Discussed proper technique for car transfers to avoid excessive cervical ROM, as well as brace maintenance when at home. Updating recommendation to reflect pt's current functional mobility to HHPT upon d/c from [facility]. Acute PT to follow.     If plan is discharge home, recommend the following: A little help with walking and/or transfers;A little help with bathing/dressing/bathroom;Assistance with cooking/housework;Assist for transportation;Help with stairs or ramp for entrance   Can travel by private vehicle        Equipment Recommendations  Rolling walker (2 wheels)    Recommendations for Other Services       Precautions / Restrictions Precautions Precautions: Fall;Cervical Precaution Booklet Issued: Yes (comment) Recall of Precautions/Restrictions: Intact Cervical Brace: Hard collar;At all times Restrictions Weight Bearing Restrictions Per Provider Order: No     Mobility  Bed Mobility Overal bed mobility: Needs Assistance Bed  Mobility: Supine to Sit     Supine to sit: Supervision     General bed mobility comments: Pt appropriately demonstrated log roll without being cued to do so, although pt initiated supine>sit before instruction given.    Transfers Overall transfer level: Needs assistance Equipment used: Rolling walker (2 wheels) Transfers: Sit to/from Stand Sit to Stand: Contact guard assist           General transfer comment: No physical assist required to perform sit>stand, only CGA for safety.    Ambulation/Gait Ambulation/Gait assistance: Contact guard assist Gait Distance (Feet): 200 Feet Assistive device: Rolling walker (2 wheels) Gait Pattern/deviations: Step-through pattern, Decreased stride length Gait velocity: decreased Gait velocity interpretation: <1.8 ft/sec, indicate of risk for recurrent falls   General Gait Details: Pt motivated to walk further today, and did not require rest break throughout. Demonstrated proper use of RW, including staying between the RW throughout gait.   Stairs             Wheelchair Mobility     Tilt Bed    Modified Rankin (Stroke Patients Only)       Balance Overall balance assessment: Needs assistance Sitting-balance support: Feet supported Sitting balance-Leahy Scale: Fair Sitting balance - Comments: Able to assist putting on extra gown in sitting.   Standing balance support: During functional activity, Bilateral upper extremity supported Standing balance-Leahy Scale: Poor Standing balance comment: reliant on RW                            Communication Communication Communication: No apparent difficulties  Cognition Arousal: Alert Behavior During Therapy: [facility] for tasks assessed/performed  PT - Cognitive impairments: No apparent impairments                         Following commands: Intact      Cueing Cueing Techniques: Verbal cues  Exercises      General Comments General comments (skin  integrity, edema, etc.): Encouraged pt end session in chair today to improve sitting tolerance, which at first pt was reluctant to do. Further reinforced log roll techniques, brace maintenance, cervical precautions, and discussed techniques for safe in/out of car transfers.      Pertinent Vitals/Pain Pain Assessment Pain Assessment: Faces Faces Pain Scale: Hurts even more Pain Location: neck Pain Descriptors / Indicators: Grimacing, Guarding Pain Intervention(s): Limited activity within patient's tolerance, Monitored during session, Patient requesting pain meds-RN notified    Home Living                          Prior Function            PT Goals (current goals can now be found in the care plan section) Acute Rehab PT Goals Patient Stated Goal: return to independence PT Goal Formulation: With patient Time For Goal Achievement: 12/27/23 Potential to Achieve Goals: Good Progress towards PT goals: Progressing toward goals    Frequency    Min 3X/week      PT Plan      Co-evaluation              AM-PAC PT 6 Clicks Mobility   Outcome Measure  Help needed turning from your back to your side while in a flat bed without using bedrails?: A Little Help needed moving from lying on your back to sitting on the side of a flat bed without using bedrails?: A Little Help needed moving to and from a bed to a chair (including a wheelchair)?: A Little Help needed standing up from a chair using your arms (e.g., wheelchair or bedside chair)?: A Little Help needed to walk in hospital room?: A Little Help needed climbing 3-5 steps with a railing? : A Lot 6 Click Score: 17    End of Session Equipment Utilized During Treatment: Gait belt;Cervical collar Activity Tolerance: Patient tolerated treatment well Patient left: in chair;with chair alarm set;with call bell/phone within reach Nurse Communication: Mobility status PT Visit Diagnosis: Unsteadiness on feet  (R26.81);History of falling (Z91.81);Difficulty in walking, not elsewhere classified (R26.2);Pain     Time: 1206-1229 PT Time Calculation (min) (ACUTE ONLY): 23 min  Charges:                            Ceci Taliaferro, SPT    Anvay Tennis 12/15/2023, 3:41 PM

## 2023-12-15 NOTE — Plan of Care (Signed)

## 2023-12-15 NOTE — Progress Notes (Signed)
 [name]  FMW:[id number] DOB: [birth year] DOA: 12/10/2023 PCP: Bluford Jacqulyn MATSU, DO    Brief Narrative:  73 year old with a history of anxiety/depression, osteoarthritis, HTN, PFO, and OSA who presented to the [facility] ER 10/23 after suffering a mechanical fall in which he tripped while on his deck and then fell head forward into a post striking the vertex of his head with great force.  At the time of his presentation he initially was experiencing numbness in his feet and right arm, but fortunately this resolved during his stay in the ER.  CT cervical spine at presentation confirmed a Jefferson fracture of C1 as well as mildly displaced fractures of the superior and inferior facets of C2. CTa of the head noted only irregularity of the right V3 vertebral artery with intramural filling defect compatible with acute arterial injury with intraluminal thrombus and/or dissection.  His case was discussed with Neurosurgery and the decision was made to transfer him to [facility] for definitive evaluation.  Goals of Care:   Code Status: Full Code   DVT prophylaxis: SCDs Start: 12/10/23 2257   Interim Hx: No acute events recorded overnight.  The patient is progressing well per Neurosurgery.  He is afebrile.  Blood pressure is modestly elevated with systolics 153-162.  Oxygen saturations normal.  Some mild hypokalemia is noted.  The patient tells me he is moving his bowels.  He continues to have poor oral intake.  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 Kerrin in the outpatient setting w/ annual screening   Family Communication: No family present at time of exam Disposition: Plan to discharge home per patient request 10/29 or 10/30  Objective: Blood pressure (!) 157/92, pulse (!) 52, temperature 98 F (36.7 C), temperature source Oral, resp. rate 18, height 6' 1 (1.854 m), weight 101.2 kg, SpO2 96%.  Intake/Output Summary (Last 24 hours) at 12/15/2023 0816 Last data filed at 12/15/2023 0048 Gross per 24 hour  Intake 240 ml  Output 500 ml  Net -260 ml   Filed Weights   12/10/23 1557  Weight: 101.2 kg    Examination: General: No acute respiratory distress Lungs: Clear to auscultation bilaterally Cardiovascular: Regular rate and rhythm without murmur  Abdomen: Nontender, nondistended, soft, bowel sounds positive, no rebound Extremities: No significant edema B LE   CBC: Recent Labs  Lab 12/10/23 1655 12/11/23 0455 12/12/23 0513 12/15/23 0247  WBC 8.6 7.8 7.1 6.9  NEUTROABS 6.9  --   --   --   HGB 14.8 14.0 13.0 13.1  HCT 42.0 40.9 38.8* 37.0*  MCV 97.0 99.3 100.0 95.4  PLT 182 179 169 168   Basic Metabolic Panel:  Recent Labs  Lab 12/11/23 0455 12/12/23 0513 12/15/23 0247  NA 140 134* 136  K 4.2 3.6 3.2*  CL 102 100 98  CO2 28 27 29   GLUCOSE 132* 122* 121*  BUN 17 15 14   CREATININE 0.78 0.79 0.77  CALCIUM  8.6* 8.3* 8.7*  MG  --  1.9 1.8   GFR: Estimated Creatinine Clearance: 99.8 mL/min (by C-G formula based on SCr of 0.77 mg/dL).   Scheduled Meds:  aspirin   325  mg Oral Daily   ezetimibe  10 mg Oral Daily   lamoTRIgine   200 mg Oral BID   losartan   50 mg Oral Daily   mirtazapine   45 mg Oral QHS   pantoprazole   40 mg Oral Daily   polyethylene glycol  17 g Oral BID   QUEtiapine  50 mg Oral QHS   senna-docusate  1 tablet Oral BID     LOS: 5 days   Reyes IVAR Moores, MD [facility] Office  [phone] Pager - Text Page per Tracey  If 7PM-7AM, please contact night-coverage per Amion 12/15/2023, 8:16 AM

## 2023-12-16 ENCOUNTER — Other Ambulatory Visit (HOSPITAL_COMMUNITY): Payer: Self-pay

## 2023-12-16 ENCOUNTER — Telehealth (HOSPITAL_COMMUNITY): Payer: Self-pay

## 2023-12-16 DIAGNOSIS — S12001G Unspecified nondisplaced fracture of first cervical vertebra, subsequent encounter for fracture with delayed healing: Secondary | ICD-10-CM | POA: Diagnosis not present

## 2023-12-16 MED ORDER — POLYETHYLENE GLYCOL 3350 17 GM/SCOOP PO POWD
17.0000 g | Freq: Two times a day (BID) | ORAL | 0 refills | Status: AC
  Filled 2023-12-16: qty 238, 7d supply, fill #0

## 2023-12-16 MED ORDER — METHOCARBAMOL 500 MG PO TABS
500.0000 mg | ORAL_TABLET | Freq: Four times a day (QID) | ORAL | Status: DC | PRN
  Administered 2023-12-16 – 2023-12-17 (×2): 500 mg via ORAL
  Filled 2023-12-16 (×2): qty 1

## 2023-12-16 MED ORDER — ASPIRIN 325 MG PO TABS
325.0000 mg | ORAL_TABLET | Freq: Every day | ORAL | 0 refills | Status: AC
  Filled 2023-12-16: qty 90, 90d supply, fill #0

## 2023-12-16 MED ORDER — DIAZEPAM 5 MG PO TABS
5.0000 mg | ORAL_TABLET | Freq: Two times a day (BID) | ORAL | 0 refills | Status: DC | PRN
  Filled 2023-12-16: qty 30, 15d supply, fill #0

## 2023-12-16 MED ORDER — POTASSIUM CHLORIDE 20 MEQ PO PACK
20.0000 meq | PACK | Freq: Every day | ORAL | 0 refills | Status: AC
  Filled 2023-12-16: qty 30, 30d supply, fill #0

## 2023-12-16 MED ORDER — OXYCODONE HCL 5 MG PO TABS
5.0000 mg | ORAL_TABLET | ORAL | 0 refills | Status: DC | PRN
  Filled 2023-12-16: qty 30, 5d supply, fill #0

## 2023-12-16 MED ORDER — DIAZEPAM 5 MG PO TABS
5.0000 mg | ORAL_TABLET | Freq: Two times a day (BID) | ORAL | Status: DC | PRN
  Administered 2023-12-16 (×2): 5 mg via ORAL
  Filled 2023-12-16 (×2): qty 1

## 2023-12-16 MED ORDER — HYDRALAZINE HCL 20 MG/ML IJ SOLN
5.0000 mg | INTRAMUSCULAR | Status: DC | PRN
  Administered 2023-12-16: 5 mg via INTRAVENOUS
  Filled 2023-12-16: qty 1

## 2023-12-16 MED ORDER — CARVEDILOL 3.125 MG PO TABS: 3.1250 mg | ORAL_TABLET | Freq: Two times a day (BID) | ORAL | Status: DC

## 2023-12-16 NOTE — Progress Notes (Signed)
 [facility] night cross cover note:   I was contacted by the patient's RN  regarding the pt's existing order for prn iv robaxin for muscle spasms, with RN noting anticipated discharge tomorrow and conveying that pt is tolerating PO.   I subsequently changed pt's existing order for Robaxin 500 mg iv q6h prn for muscle spasms to Robaxin 500 mg PO Q6h prn for refractory muscle spasms, noting that he also has an existing order for prn PO Valium for muscle spasms.      Eva Pore, DO Hospitalist

## 2023-12-16 NOTE — Progress Notes (Signed)
 Occupational Therapy Treatment Patient Details Name: [name] MRN: [id number] DOB: [birth year] Today's Date: 12/16/2023   History of present illness Pt is a 73 y.o. M who presents 12/10/2023 after a fall at home on his deck. Found to have C1 burst fx, C2 R facet fx with likely V3 dissection. Plan for trial of cervical hard collar and pain control prior to considering operative intervention. Significant PMH: HTN, R knee arthroscopy, CAD, stroke, TAA, severe recurrent major depression.   OT comments  Session focused on education regarding compensatory techniques for performance of BADL within precautions. Pt needing up to CGA for OOB mobility and LB ADL. Pt progressing well toward established OT goals with OT recommendation updated to [facility] for initial home safety eval. Will continue to follow.       If plan is discharge home, recommend the following:  A little help with walking and/or transfers;A little help with bathing/dressing/bathroom;Assistance with cooking/housework;Assist for transportation;Help with stairs or ramp for entrance   Equipment Recommendations  BSC/3in1;Other (comment) (RW)    Recommendations for Other Services      Precautions / Restrictions Precautions Precautions: Fall;Cervical Precaution Booklet Issued: Yes (comment) Recall of Precautions/Restrictions: Intact Required Braces or Orthoses: Cervical Brace Cervical Brace: Hard collar;At all times Restrictions Weight Bearing Restrictions Per Provider Order: No       Mobility Bed Mobility Overal bed mobility: Needs Assistance Bed Mobility: Rolling, Sidelying to Sit, Sit to Sidelying Rolling: Supervision Sidelying to sit: Supervision     Sit to sidelying: Supervision General bed mobility comments: pt with good demo of log roll technique    Transfers Overall transfer level: Needs assistance Equipment used: Rolling walker (2 wheels) Transfers: Sit to/from Stand Sit to Stand: Contact guard assist            General transfer comment: CGA for safety; x2 attempts to rise from low EOB     Balance Overall balance assessment: Needs assistance Sitting-balance support: Feet supported Sitting balance-Leahy Scale: Fair     Standing balance support: During functional activity, Bilateral upper extremity supported Standing balance-Leahy Scale: Poor Standing balance comment: reliant on RW                           ADL either performed or assessed with clinical judgement   ADL Overall ADL's : Needs assistance/impaired     Grooming: Supervision/safety;Standing               Lower Body Dressing: Sit to/from stand;Contact guard assist   Toilet Transfer: Contact guard assist;Supervision/safety;Rolling walker (2 wheels);Ambulation           Functional mobility during ADLs: Contact guard assist;Supervision/safety;Rolling walker (2 wheels)      Extremity/Trunk Assessment              Vision       Perception     Praxis     Communication Communication Communication: No apparent difficulties   Cognition Arousal: Alert Behavior During Therapy: WFL for tasks assessed/performed Cognition: No apparent impairments                               Following commands: Intact        Cueing   Cueing Techniques: Verbal cues  Exercises      Shoulder Instructions       General Comments provided education regarding brace adjustment as needed as well as changing out pads on  brace after shower    Pertinent Vitals/ Pain       Pain Assessment Pain Assessment: Faces Faces Pain Scale: Hurts little more Pain Location: neck Pain Descriptors / Indicators: Grimacing, Guarding Pain Intervention(s): Limited activity within patient's tolerance, Monitored during session  Home Living                                          Prior Functioning/Environment              Frequency           Progress Toward Goals  OT  Goals(current goals can now be found in the care plan section)  Progress towards OT goals: Progressing toward goals  Acute Rehab OT Goals Patient Stated Goal: be home to take care of wife OT Goal Formulation: With patient Time For Goal Achievement: 12/28/23 Potential to Achieve Goals: Good ADL Goals Pt Will Perform Grooming: with modified independence;standing Pt Will Perform Upper Body Dressing: with modified independence;sitting Pt Will Perform Lower Body Dressing: with modified independence;sit to/from stand Pt Will Transfer to Toilet: with modified independence;ambulating Additional ADL Goal #1: pt will be mod I for all basic mobility and BADL within cervical spinal precautions.  Plan      Co-evaluation                 AM-PAC OT 6 Clicks Daily Activity     Outcome Measure   Help from another person eating meals?: None Help from another person taking care of personal grooming?: A Little Help from another person toileting, which includes using toliet, bedpan, or urinal?: A Little Help from another person bathing (including washing, rinsing, drying)?: A Little Help from another person to put on and taking off regular upper body clothing?: A Little Help from another person to put on and taking off regular lower body clothing?: A Little 6 Click Score: 19    End of Session Equipment Utilized During Treatment: Rolling walker (2 wheels);Cervical collar  OT Visit Diagnosis: Unsteadiness on feet (R26.81);Muscle weakness (generalized) (M62.81);Pain   Activity Tolerance Patient tolerated treatment well   Patient Left in bed;with call bell/phone within reach;with bed alarm set   Nurse Communication Mobility status        Time: 8384-8360 OT Time Calculation (min): 24 min  Charges: OT General Charges $OT Visit: 1 Visit OT Treatments $Self Care/Home Management : 23-37 mins  [name] [name] [facility] Acute Rehabilitation Office: [phone]   [name] 12/16/2023, 4:47 PM

## 2023-12-16 NOTE — Discharge Summary (Signed)
 DISCHARGE SUMMARY  ZYLON CREAMER  MR#: [id number]  DOB:September 06, 1950  Date of Admission: 12/10/2023 Date of Discharge: 12/16/2023  Attending Physician:Nerissa Constantin ONEIDA Moores, MD  Patient's ERE:Rnnx, Jayce G, DO  Disposition: D/C home with [facility] services   Follow-up Appts:  Follow-up Information     [facility] Follow up.   Why: [facility] will contact you for the first home visit. Contact information: [phone]        Mavis Purchase, MD Follow up.   Specialty: Neurosurgery Why: Follow up as recommended at the time of discharge. Contact information: [address] [place] [address] [phone]         Cook, Jayce G, DO Follow up in 1 week(s).   Specialty: Family Medicine Why: For a check of your blood pressure control as well as your electrolytes (potassium primarily) Contact information: [address] [place] [address] [phone]                Discharge Diagnoses: Jefferson fracture (burst fx of C1) - Fracture of C2 s/p mechanical fall Possible right V3 vertebral artery dissection HTN Mild hypokalemia Dyslipidemia Anxiety/depression Acute delirium Ascending Thoracic Aneurysm   Initial presentation: 73 year old with a history of anxiety/depression, osteoarthritis, HTN, PFO, and OSA who presented to the [facility] ER 10/23 after suffering a mechanical fall in which he tripped while on his deck and then fell head forward into a post striking the vertex of his head with great force. At the time of his presentation he initially was experiencing numbness in his feet and right arm, but fortunately this resolved during his stay in the ER. CT cervical spine at presentation confirmed a Jefferson fracture of C1 as well as mildly displaced fractures of the superior and inferior facets of C2. CTa of the head noted only irregularity of the right V3 vertebral artery with intramural filling defect compatible with acute  arterial injury with intraluminal thrombus and/or dissection. His case was discussed with Neurosurgery and the decision was made to transfer him to [facility] for definitive evaluation.   Hospital Course:  Jefferson fracture (burst fx of C1) - Fracture of C2 s/p mechanical fall The patient is in a hard cervical collar - he has been neurologically intact th/o this hospital stay - Neurosurgery followed and felt he is stable - plan will be for outpatient follow-up in Neurosurgery office - hoping to avoid surgery as able    Possible right V3 vertebral artery dissection Aspirin  initiated by Neurosurgery 10/28   HTN BP has been variable during this admission, often elevated in setting of pain - use of IV BB resulted in transient hypotension - meds adjusted during this admit - resume usual home BP meds at time of d/c as pt reports his BP is usually well controlled at home    Mild hypokalemia Due to poor intake - supplemented during admission - should normalize once home with resumption of his usual diet    Dyslipidemia Continue usual Zetia dose   Anxiety/depression Continue usual Remeron  Seroquel and Lamictal  doses - low dose valium added to his regimen by Neurosurgery during this admit for anxiolytic as well as muscle relaxant properties    Acute delirium Related to acute injury and narcotic pain medications - increased dose of usual Seroquel to assist with sleep - appears resolved at time of d/c    Ascending Thoracic Aneurysm  Followed by Dr. Kerrin in the outpatient setting w/ annual screening - will be due for  f/u later next month  Allergies as of 12/16/2023       Reactions   Aricept  [donepezil  Hcl] Other (See Comments)   Passed out/hypotension Syncope    Bee Venom Swelling   Namenda  [memantine  Hcl] Other (See Comments)   Hypotension - passed out Syncope    Divalproex Sodium Other (See Comments)   Patient fainted but was taking this medication with another different  drug. Questionable if the cause of passing out   Oxcarbazepine Other (See Comments)   Unspecified reaction   Quinine Derivatives Other (See Comments)   Passed out   Statins Other (See Comments)   TGA/migraines         Medication List     STOP taking these medications    aspirin  EC 81 MG tablet Replaced by: aspirin  325 MG tablet       TAKE these medications    acetaminophen  500 MG tablet Commonly known as: TYLENOL  Take 500-1,000 mg by mouth every 6 (six) hours as needed for moderate pain.   aspirin  325 MG tablet Take 1 tablet (325 mg total) by mouth daily. Start taking on: December 17, 2023 Replaces: aspirin  EC 81 MG tablet   diazepam 5 MG tablet Commonly known as: VALIUM Take 1 tablet (5 mg total) by mouth every 12 (twelve) hours as needed for muscle spasms or anxiety.   ezetimibe 10 MG tablet Commonly known as: ZETIA TAKE ONE TABLET BY MOUTH ONCE DAILY   hydrochlorothiazide 12.5 MG capsule Commonly known as: MICROZIDE Take 1 capsule (12.5 mg total) by mouth daily.   lamoTRIgine  200 MG tablet Commonly known as: LAMICTAL  Take 200 mg by mouth 2 (two) times daily.   losartan  50 MG tablet Commonly known as: COZAAR  Take 1 tablet (50 mg total) by mouth daily.   mirtazapine  45 MG tablet Commonly known as: REMERON  Take 45 mg by mouth at bedtime.   oxyCODONE  5 MG immediate release tablet Commonly known as: Oxy IR/ROXICODONE  Take 1-2 tablets (5-10 mg total) by mouth every 4 (four) hours as needed for moderate pain (pain score 4-6).   polyethylene glycol 17 g packet Commonly known as: MIRALAX  / GLYCOLAX  Take 17 g by mouth 2 (two) times daily.   potassium chloride  20 MEQ packet Commonly known as: KLOR-CON  Take 20 mEq by mouth daily.   QUEtiapine 25 MG tablet Commonly known as: SEROQUEL Take 25 mg by mouth at bedtime.               Durable Medical Equipment  (From admission, onward)           Start     Ordered   12/16/23 1147  For home use  only DME Bedside commode  Once       Question:  Patient needs a bedside commode to treat with the following condition  Answer:  Weakness   12/16/23 1148   12/16/23 1146  For home use only DME Walker rolling  Once       Question Answer Comment  Walker: With 5 Inch Wheels   Patient needs a walker to treat with the following condition Weakness      12/16/23 1148            Day of Discharge BP 137/80 (BP Location: Left Arm)   Pulse (!) 52   Temp 98.7 F (37.1 C) (Oral)   Resp 18   Ht 6' 1 (1.854 m)   Wt 101.2 kg   SpO2 96%   BMI 29.44 kg/m   Physical  Exam: General: No acute respiratory distress Lungs: Clear to auscultation bilaterally without wheezes or crackles Cardiovascular: Regular rate and rhythm without murmur gallop or rub normal S1 and S2 Abdomen: Nontender, nondistended, soft, bowel sounds positive, no rebound, no ascites, no appreciable mass Extremities: No significant cyanosis, clubbing, or edema bilateral lower extremities  Basic Metabolic Panel: Recent Labs  Lab 12/10/23 1655 12/11/23 0455 12/12/23 0513 12/15/23 0247  NA 139 140 134* 136  K 3.2* 4.2 3.6 3.2*  CL 101 102 100 98  CO2 19* 28 27 29   GLUCOSE 108* 132* 122* 121*  BUN 18 17 15 14   CREATININE 0.87 0.78 0.79 0.77  CALCIUM  8.6* 8.6* 8.3* 8.7*  MG  --   --  1.9 1.8    CBC: Recent Labs  Lab 12/10/23 1655 12/11/23 0455 12/12/23 0513 12/15/23 0247  WBC 8.6 7.8 7.1 6.9  NEUTROABS 6.9  --   --   --   HGB 14.8 14.0 13.0 13.1  HCT 42.0 40.9 38.8* 37.0*  MCV 97.0 99.3 100.0 95.4  PLT 182 179 169 168    Time spent in discharge (includes decision making & examination of pt): 35 minutes  12/16/2023, 3:09 PM   Reyes IVAR Moores, MD [facility] Office  [phone]

## 2023-12-16 NOTE — Plan of Care (Signed)

## 2023-12-16 NOTE — Plan of Care (Signed)
   Problem: Education: Goal: Knowledge of General Education information will improve Description Including pain rating scale, medication(s)/side effects and non-pharmacologic comfort measures Outcome: Progressing   Problem: Health Behavior/Discharge Planning: Goal: Ability to manage health-related needs will improve Outcome: Progressing

## 2023-12-16 NOTE — TOC Progression Note (Signed)
 Transition of Care [facility]) - Progression Note    Patient Details  Name: [name] MRN: [id number] Date of Birth: February 05, 1951  Transition of [facility]) CM/SW Contact  Andrez JULIANNA George, RN Phone Number: 12/16/2023, 1:55 PM  Clinical Narrative:     Pt now requesting recommended DME for home. CM sent orders to [employer]. [employer] delivered DME to the room. Pt asking for [facility] transportation home. They dont have any availability. Pt then asked for [facility] for transport home tomorrow. Cm has arranged [facility] for transport with a 1 pm pick up. Pt to pay for transport.   IP Care management following.  Expected Discharge Plan: Home w Home Health Services Barriers to Discharge: Continued Medical Work up               Expected Discharge Plan and Services   Discharge Planning Services: CM Consult Post Acute Care Choice: Home Health Living arrangements for the past 2 months: Single Family Home                 DME Arranged: Bedside commode, Walker rolling DME Agency: [employer] Date DME Agency Contacted: 12/16/23   Representative spoke with at DME Agency: [facility] HH Arranged: OT, PT HH Agency: [facility] Date HH Agency Contacted: 12/14/23   Representative spoke with at [facility] Agency: [facility]   Social Drivers of Health (SDOH) Interventions SDOH Screenings   Food Insecurity: No Food Insecurity (12/12/2023)  Housing: Low Risk  (12/12/2023)  Transportation Needs: No Transportation Needs (12/12/2023)  Utilities: Not At Risk (12/12/2023)  Alcohol Screen: Low Risk  (10/23/2023)  Depression (PHQ2-9): High Risk (10/23/2023)  Financial Resource Strain: Low Risk  (10/23/2023)  Physical Activity: Sufficiently Active (10/23/2023)  Social Connections: Moderately Isolated (12/12/2023)  Stress: Stress Concern Present (10/23/2023)  Tobacco Use: Medium Risk (12/10/2023)  Health Literacy: Adequate Health Literacy (10/23/2023)    Readmission Risk Interventions     12/11/2023   10:19 AM  Readmission Risk Prevention Plan  Post Dischage Appt Complete  Medication Screening Complete  Transportation Screening Complete

## 2023-12-16 NOTE — Progress Notes (Signed)
   Providing Compassionate, Quality Care - Together   Subjective: Patient reports feeling very anxious/stressed presently. His blood pressure has been running high. Patient reports headache and dizziness following administration of IV hydralazine  at [phone]. However, his BP does not appear to have dropped below 162/81 since administration. Patient reports his BP typically runs within a good range at home. He expresses concern about getting home to his wife who has stage IV lung cancer.  Objective: Vital signs in last 24 hours: Temp:  [97.8 F (36.6 C)-98.2 F (36.8 C)] 97.8 F (36.6 C) (10/29 0741) Pulse Rate:  [45-62] 60 (10/29 0741) Resp:  [17-20] 18 (10/29 0741) BP: (145-186)/(75-97) 166/90 (10/29 0741) SpO2:  [91 %-97 %] 97 % (10/29 0741)  Intake/Output from previous day: 10/28 0701 - 10/29 0700 In: 220 [P.O.:220] Out: -  Intake/Output this shift: Total I/O In: -  Out: 400 [Urine:400]  Alert and oriented PERRLA Speech clear MAE, S/S intact Hard cervical collar in place  Lab Results: Recent Labs    12/15/23 0247  WBC 6.9  HGB 13.1  HCT 37.0*  PLT 168   BMET Recent Labs    12/15/23 0247  NA 136  K 3.2*  CL 98  CO2 29  GLUCOSE 121*  BUN 14  CREATININE 0.77  CALCIUM  8.7*    Studies/Results: No results found.  Assessment/Plan: Patient suffered a C1 Jefferson fracture, C2 fracture, and vertebral artery injury following a fall. ASA started yesterday.   LOS: 6 days   -Hard collar at all times. -Will add low dose of diazepam to help patient's acute anxiety. -Plan per patient is for d/c home tomorrow morning.  I am in communication with my attending and they agree with the plan for this patient.   Gerard Beck, DNP, AGNP-C Nurse Practitioner  [facility] [address], [address], [place], [place] [address] P: [phone]    F: [phone]  12/16/2023, 10:43 AM

## 2023-12-16 NOTE — Telephone Encounter (Signed)
 Pharmacy Patient Advocate Encounter  Received notification from [facility] that Prior Authorization for Diazepam 5mg  tablet has been APPROVED from 12/16/2023 to 01/15/24. Ran test claim, Copay is $2.56. This test claim was processed through [facility]- copay amounts may vary at other pharmacies due to pharmacy/plan contracts, or as the patient moves through the different stages of their insurance plan.   PA #/Case ID/Reference #: [id number]

## 2023-12-17 DIAGNOSIS — E785 Hyperlipidemia, unspecified: Secondary | ICD-10-CM | POA: Diagnosis not present

## 2023-12-17 DIAGNOSIS — I10 Essential (primary) hypertension: Secondary | ICD-10-CM | POA: Diagnosis not present

## 2023-12-17 DIAGNOSIS — F419 Anxiety disorder, unspecified: Secondary | ICD-10-CM | POA: Diagnosis not present

## 2023-12-17 DIAGNOSIS — S12001G Unspecified nondisplaced fracture of first cervical vertebra, subsequent encounter for fracture with delayed healing: Secondary | ICD-10-CM | POA: Diagnosis not present

## 2023-12-17 LAB — BASIC METABOLIC PANEL WITH GFR
Anion gap: 13 (ref 5–15)
BUN: 9 mg/dL (ref 8–23)
CO2: 26 mmol/L (ref 22–32)
Calcium: 8.5 mg/dL — ABNORMAL LOW (ref 8.9–10.3)
Chloride: 97 mmol/L — ABNORMAL LOW (ref 98–111)
Creatinine, Ser: 0.73 mg/dL (ref 0.61–1.24)
GFR, Estimated: >60 mL/min (ref >60)
Glucose, Bld: 113 mg/dL — ABNORMAL HIGH (ref 70–99)
Potassium: 3 mmol/L — ABNORMAL LOW (ref 3.5–5.1)
Sodium: 136 mmol/L (ref 135–145)

## 2023-12-17 LAB — MAGNESIUM: Magnesium: 1.9 mg/dL (ref 1.7–2.4)

## 2023-12-17 MED ORDER — POTASSIUM CHLORIDE CRYS ER 20 MEQ PO TBCR
40.0000 meq | EXTENDED_RELEASE_TABLET | Freq: Once | ORAL | Status: DC
  Filled 2023-12-17: qty 2

## 2023-12-17 NOTE — Progress Notes (Signed)
 Communicated with patient via Caregility. Patient appears comfortable in the bed. AVS paperwork given to the patient. Discharge instructions discussed with the patient. Patient's question answered to satisfaction. Patient agreeable with discharge plan.

## 2023-12-17 NOTE — Progress Notes (Signed)
 I personally made a plan with the tech and patient that we would wheel him out at 12:45, swing by and get his meds, and then take him to the front where his ride was supposed to be waiting at 1300. Pt called nurses station at 12:30 stating his ride was downstairs and was agitated that he wasn't already down there. Charge nurse called volunteers to get patient. Once pt got down to the front, around 1320 I got a call saying his ride wasn't there and wouldn't be there for another hour.  Pt stated we rushed him out. Techs went down and took him to the DC lounge where he will wait for [facility] to transport him.

## 2023-12-17 NOTE — TOC Transition Note (Signed)
 Transition of Care [facility]) - Discharge Note   Patient Details  Name: [name] MRN: [id number] Date of Birth: [birth year]  Transition of Care [facility]) CM/SW Contact:  Andrez JULIANNA George, RN Phone Number: 12/17/2023, 10:56 AM   Clinical Narrative:     Pt is discharging home with home health through [facility]. [facility] aware of dc home today. [facility] information on AVS. [facility] will contact him for the first home visit.  DME for home is at the bedside.  RN aware of 1 pm transport home via [facility].   Final next level of care: Home w Home Health Services Barriers to Discharge: No Barriers Identified   Patient Goals and CMS Choice   CMS Medicare.gov Compare Post Acute Care list provided to:: Patient Choice offered to / list presented to : Patient      Discharge Placement                       Discharge Plan and Services Additional resources added to the After Visit Summary for     Discharge Planning Services: CM Consult Post Acute Care Choice: Home Health          DME Arranged: Bedside commode, Walker rolling DME Agency: [employer] Date DME Agency Contacted: 12/16/23   Representative spoke with at DME Agency: [facility] HH Arranged: OT, PT HH Agency: [facility] Date HH Agency Contacted: 12/14/23   Representative spoke with at [facility] Agency: [facility]  Social Drivers of Health (SDOH) Interventions SDOH Screenings   Food Insecurity: No Food Insecurity (12/12/2023)  Housing: Low Risk  (12/12/2023)  Transportation Needs: No Transportation Needs (12/12/2023)  Utilities: Not At Risk (12/12/2023)  Alcohol Screen: Low Risk  (10/23/2023)  Depression (PHQ2-9): High Risk (10/23/2023)  Financial Resource Strain: Low Risk  (10/23/2023)  Physical Activity: Sufficiently Active (10/23/2023)  Social Connections: Moderately Isolated (12/12/2023)  Stress: Stress Concern Present (10/23/2023)  Tobacco Use: Medium Risk (12/10/2023)  Health Literacy:  Adequate Health Literacy (10/23/2023)     Readmission Risk Interventions    12/11/2023   10:19 AM  Readmission Risk Prevention Plan  Post Dischage Appt Complete  Medication Screening Complete  Transportation Screening Complete

## 2023-12-17 NOTE — Discharge Summary (Signed)
 Physician Discharge Summary   Patient: [name] MRN: [id number] DOB: 11/14/1950  Admit date:     12/10/2023  Discharge date: 12/17/23  Discharge Physician: Carliss LELON Canales   PCP: Cook, Jayce G, DO   Recommendations at discharge:    Pt to be discharged home with home health.   If you experience worsening fever, chills, chest pain, shortness of breath, or other concerning symptoms, please call your PCP or go to the emergency department immediately.  Discharge Diagnoses: Principal Problem:   Closed cervical spine fracture (HCC) Active Problems:   Essential hypertension   Dyslipidemia   Anxiety and depression  Resolved Problems:   * No resolved hospital problems. *   Hospital Course:  73 year old with a history of anxiety/depression, osteoarthritis, HTN, PFO, and OSA who presented to the [facility] ER 10/23 after suffering a mechanical fall in which he tripped while on his deck and then fell head forward into a post striking the vertex of his head with great force. At the time of his presentation he initially was experiencing numbness in his feet and right arm, but fortunately this resolved during his stay in the ER. CT cervical spine at presentation confirmed a Jefferson fracture of C1 as well as mildly displaced fractures of the superior and inferior facets of C2. CTa of the head noted only irregularity of the right V3 vertebral artery with intramural filling defect compatible with acute arterial injury with intraluminal thrombus and/or dissection. His case was discussed with Neurosurgery and the decision was made to transfer him to [facility] for definitive evaluation.   Assessment and Plan:  Jefferson fracture (burst fracture of C1)-fracture C2 s/p mechanical fall - Evaluated by neurosurgery.  Patient in hard cervical collar.  Remains in collar at all times.  Follow-up with neurosurgery office.  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  Hypertension - Resume home BP regimen.  Mild hypokalemia - Secondary to poor intake.  Resume home potassium supplementation daily.  Anxiety/depression - Continue home regimen.  Acute delirium - Appears resolved.  Ascending thoracic aneurysm - Followed by Dr. Kerrin in the outpatient setting.  Due for follow-up later next month.  Goals of care - Patient to be discharged home with home health.  Consultants: Neurosurgery Procedures performed: None Disposition: Home health Diet recommendation:  Discharge Diet Orders (From admission, onward)     Start     Ordered   12/17/23 0000  Diet - low sodium heart healthy        12/17/23 1029           Cardiac diet  DISCHARGE MEDICATION: Allergies as of 12/17/2023       Reactions   Aricept  [donepezil  Hcl] Other (See Comments)   Passed out/hypotension Syncope    Bee Venom Swelling   Namenda  [memantine  Hcl] Other (See Comments)   Hypotension - passed out Syncope    Divalproex Sodium Other (See Comments)   Patient fainted but was taking this medication with another different drug. Questionable if the cause of passing out   Oxcarbazepine Other (See Comments)   Unspecified reaction   Quinine Derivatives Other (See Comments)   Passed out   Statins Other (See Comments)   TGA/migraines         Medication List     STOP taking these medications    aspirin  EC 81 MG tablet Replaced by: aspirin  325 MG tablet       TAKE these medications    acetaminophen   500 MG tablet Commonly known as: TYLENOL  Take 500-1,000 mg by mouth every 6 (six) hours as needed for moderate pain.   aspirin  325 MG tablet Take 1 tablet (325 mg total) by mouth daily. Replaces: aspirin  EC 81 MG tablet   diazepam 5 MG tablet Commonly known as: VALIUM Take 1 tablet (5 mg total) by mouth every 12 (twelve) hours as needed for muscle spasms or anxiety.   ezetimibe 10 MG tablet Commonly known as: ZETIA TAKE ONE TABLET BY  MOUTH ONCE DAILY   hydrochlorothiazide 12.5 MG capsule Commonly known as: MICROZIDE Take 1 capsule (12.5 mg total) by mouth daily.   lamoTRIgine  200 MG tablet Commonly known as: LAMICTAL  Take 200 mg by mouth 2 (two) times daily.   losartan  50 MG tablet Commonly known as: COZAAR  Take 1 tablet (50 mg total) by mouth daily.   mirtazapine  45 MG tablet Commonly known as: REMERON  Take 45 mg by mouth at bedtime.   oxyCODONE  5 MG immediate release tablet Commonly known as: Oxy IR/ROXICODONE  Take 1-2 tablets (5-10 mg total) by mouth every 4 (four) hours as needed for moderate pain (pain score 4-6).   polyethylene glycol powder 17 GM/SCOOP powder Commonly known as: GLYCOLAX /MIRALAX  Dissolve 1 capful (17g) in 4-8 ounces of liquid and take by mouth 2 (two) times daily.   potassium chloride  20 MEQ packet Commonly known as: KLOR-CON  Take 20 mEq by mouth daily.   QUEtiapine 25 MG tablet Commonly known as: SEROQUEL Take 25 mg by mouth at bedtime.               Durable Medical Equipment  (From admission, onward)           Start     Ordered   12/16/23 1147  For home use only DME Bedside commode  Once       Question:  Patient needs a bedside commode to treat with the following condition  Answer:  Weakness   12/16/23 1148   12/16/23 1146  For home use only DME Walker rolling  Once       Question Answer Comment  Walker: With 5 Inch Wheels   Patient needs a walker to treat with the following condition Weakness      12/16/23 1148            Follow-up Information     [facility] Follow up.   Why: [facility] will contact you for the first home visit. Contact information: [phone]        Mavis Purchase, MD Follow up.   Specialty: Neurosurgery Why: Follow up as recommended at the time of discharge. Contact information: [address] [place] [address] [phone]         Cook, Jayce G, DO Follow up in 1 week(s).    Specialty: Family Medicine Why: For a check of your blood pressure control as well as your electrolytes (potassium primarily) Contact information: [address] [place] [address] [phone]                 Discharge Exam: Fredricka Weights   12/10/23 1557  Weight: 101.2 kg    GENERAL:  Alert, pleasant, no acute distress  HEENT:  EOMI, hard cervical collar in place CARDIOVASCULAR:  RRR, no murmurs appreciated RESPIRATORY:  Clear to auscultation, no wheezing, rales, or rhonchi GASTROINTESTINAL:  Soft, nontender, nondistended EXTREMITIES:  No LE edema bilaterally NEURO:  No new focal deficits appreciated SKIN:  No rashes noted PSYCH:  Appropriate  mood and affect     Condition at discharge: improving  The results of significant diagnostics from this hospitalization (including imaging, microbiology, ancillary and laboratory) are listed below for reference.   Imaging Studies: DG CHEST PORT 1 VIEW Result Date: 12/14/2023 EXAM: 1 VIEW XRAY OF THE CHEST 12/14/2023 04:00:14 AM COMPARISON: None available. CLINICAL HISTORY: Hypoxia 200808. Images from the original note were not included.; Hypoxia 200808 FINDINGS: LUNGS AND PLEURA: Low lung volumes. Coarsened interstitial markings. No confluent pulmonary infiltrate. No pleural effusion. No pneumothorax. HEART AND MEDIASTINUM: Cardiomegaly. Atherosclerotic calcifications. BONES AND SOFT TISSUES: No acute osseous abnormality. IMPRESSION: 1. No acute findings. 2. Cardiomegaly and atherosclerotic calcifications. Electronically signed by: Dorethia Molt MD 12/14/2023 04:08 AM EDT RP Workstation: [id number]   CT ANGIO HEAD NECK W WO CM Result Date: 12/10/2023 EXAM: CTA HEAD AND NECK WITHOUT AND WITH 12/10/2023 06:44:00 PM TECHNIQUE: CTA of the head and neck was performed without and with the administration of intravenous contrast. Multiplanar 2D and/or 3D reformatted images are provided for review. Automated exposure control,  iterative reconstruction, and/or weight based adjustment of the mA/kV was utilized to reduce the radiation dose to as low as reasonably achievable. Stenosis of the internal carotid arteries measured using NASCET criteria. COMPARISON: None available CLINICAL HISTORY: Neck trauma, arterial injury suspected. Pt arrived via [facility] from home following a fall. FINDINGS: AORTIC ARCH AND ARCH VESSELS: No dissection or arterial injury. No significant stenosis of the brachiocephalic or subclavian arteries. CERVICAL CAROTID ARTERIES: No dissection, arterial injury, or hemodynamically significant stenosis by NASCET criteria. CERVICAL VERTEBRAL ARTERIES: Irregularity of the right v3 vertebral artery with intramural filling defect. Resulting moderate to severe stenosis. Left vertebral artery is patent without significant stenosis. LUNGS AND MEDIASTINUM: Unremarkable. SOFT TISSUES: No acute abnormality. BONES: Please see same day CT cervical spine for characterization of know2 ANTERIOR CIRCULATION: No significant stenosis of the internal carotid arteries. No significant stenosis of the anterior cerebral arteries. No significant stenosis of the middle cerebral arteries. No aneurysm. POSTERIOR CIRCULATION: No significant stenosis of the posterior cerebral arteries. No significant stenosis of the basilar artery or intradural vertebral arteries. No aneurysm. OTHER: No dural venous sinus thrombosis on this non-dedicated study. IMPRESSION: 1. Irregularity of the right v3 vertebral artery with intramural filling defect, compatible with acute arterial injury with intraluminal thrombus and/or dissection given adjacent fractures. Resulting moderate to severe stenosis. Findings discussed with Dr. Towana via telephone at 7:05 PM. Electronically signed by: Gilmore Molt MD 12/10/2023 07:07 PM EDT RP Workstation: [id number]   CT Cervical Spine Wo Contrast Result Date: 12/10/2023 EXAM: CT CERVICAL SPINE WITHOUT CONTRAST 12/10/2023  05:22:30 PM TECHNIQUE: CT of the cervical spine was performed without the administration of intravenous contrast. Multiplanar reformatted images are provided for review. Automated exposure control, iterative reconstruction, and/or weight based adjustment of the mA/kV was utilized to reduce the radiation dose to as low as reasonably achievable. COMPARISON: CT 04/16/2021 CLINICAL HISTORY: Neck trauma (Age >= 65y). Pt arrived via [facility] from home following a fall. Pt presents with a C-Collar in place. Pt denies LOC. Pt reports he tripped over some fire wood on his porch and hit the back of his head. EMS established IV access and administered 10mg  Morphine  and 100cc Normal Saline PTA. FINDINGS: CERVICAL SPINE: BONES AND ALIGNMENT: Bilateral fractures of the anterior and posterior arches of C1. The left-sided anterior C1 fracture extends into the inferior facet and transverse foramen of C1. Additional mildly displaced fracture of the superior and inferior facet of C2 on  the right extending into the transverse foramen. There is 4 - 5 mm rightward displacement of the lateral masses of C1 in relation to the dens compatible with ligamentous disruption. ACDF C3-C4. Ankylosis of C6-C7 and C7-T1. DEGENERATIVE CHANGES: Bulky anterior osteophytes from C4-C7. Multilevel moderate to advanced facet arthropathy. No severe spinal canal narrowing. SOFT TISSUES: No prevertebral soft tissue swelling. IMPRESSION: 1. Comminuted burst fracture of C1 (Jefferson fracture). The fracture extends into the left lateral mass of C1 including the transverse foramen. 4 - 5 mm of rightward displacement of the lateral masses of C1 in relation to the dens compatible with ligamentous disruption. Recommend neurosurgery consult and consider MRI. 2. Mildly displaced fractures of the superior and inferior facets of C2 on the right extending into the transverse foramen. CTA of the head and neck is recommended to evaluate for arterial injury. Critical  value/emergent results were called by telephone at the time of interpretation on 10 / 23 / 25 at 5:43 pm to Dr. Towana, who verbally acknowledged these results. Electronically signed by: Norman Gatlin MD 12/10/2023 05:45 PM EDT RP Workstation: [id number]   CT Head Wo Contrast Result Date: 12/10/2023 CLINICAL DATA:  Head trauma, minor (Age >= 65y).  Fall. EXAM: CT HEAD WITHOUT CONTRAST TECHNIQUE: Contiguous axial images were obtained from the base of the skull through the vertex without intravenous contrast. RADIATION DOSE REDUCTION: This exam was performed according to the departmental dose-optimization program which includes automated exposure control, adjustment of the mA and/or kV according to patient size and/or use of iterative reconstruction technique. COMPARISON:  10/16/2022 FINDINGS: Brain: No acute intracranial abnormality. Specifically, no hemorrhage, hydrocephalus, mass lesion, acute infarction, or significant intracranial injury. Vascular: No hyperdense vessel or unexpected calcification. Skull: No acute calvarial abnormality. Sinuses/Orbits: No acute findings Other: None IMPRESSION: No acute intracranial abnormality. Electronically Signed   By: Franky Crease M.D.   On: 12/10/2023 17:31    Microbiology: Results for orders placed or performed during the hospital encounter of 02/03/20  SARS CORONAVIRUS 2 (TAT 6-24 HRS) Nasopharyngeal Nasopharyngeal Swab     Status: None   Collection Time: 02/03/20  7:01 PM   Specimen: Nasopharyngeal Swab  Result Value Ref Range Status   SARS Coronavirus 2 NEGATIVE NEGATIVE Final    Comment: (NOTE) SARS-CoV-2 target nucleic acids are NOT DETECTED.  The SARS-CoV-2 RNA is generally detectable in upper and lower respiratory specimens during the acute phase of infection. Negative results do not preclude SARS-CoV-2 infection, do not rule out co-infections with other pathogens, and should not be used as the sole basis for treatment or other patient management  decisions. Negative results must be combined with clinical observations, patient history, and epidemiological information. The expected result is Negative.  Fact Sheet for Patients: hairslick.no  Fact Sheet for Healthcare Providers: quierodirigir.com  This test is not yet approved or cleared by the [place]  FDA and  has been authorized for detection and/or diagnosis of SARS-CoV-2 by FDA under an Emergency Use Authorization (EUA). This EUA will remain  in effect (meaning this test can be used) for the duration of the COVID-19 declaration under Se ction 564(b)(1) of the Act, 21 U.S.C. section 360bbb-3(b)(1), unless the authorization is terminated or revoked sooner.  Performed at [facility], [address], [place], [place] [address]     Labs: CBC: Recent Labs  Lab 12/10/23 1655 12/11/23 0455 12/12/23 0513 12/15/23 0247  WBC 8.6 7.8 7.1 6.9  NEUTROABS 6.9  --   --   --  HGB 14.8 14.0 13.0 13.1  HCT 42.0 40.9 38.8* 37.0*  MCV 97.0 99.3 100.0 95.4  PLT 182 179 169 168   Basic Metabolic Panel: Recent Labs  Lab 12/10/23 1655 12/11/23 0455 12/12/23 0513 12/15/23 0247 12/17/23 0138  NA 139 140 134* 136 136  K 3.2* 4.2 3.6 3.2* 3.0*  CL 101 102 100 98 97*  CO2 19* 28 27 29 26   GLUCOSE 108* 132* 122* 121* 113*  BUN 18 17 15 14 9   CREATININE 0.87 0.78 0.79 0.77 0.73  CALCIUM  8.6* 8.6* 8.3* 8.7* 8.5*  MG  --   --  1.9 1.8 1.9   Liver Function Tests: No results for input(s): AST, ALT, ALKPHOS, BILITOT, PROT, ALBUMIN in the last 168 hours. CBG: No results for input(s): GLUCAP in the last 168 hours.  Discharge time spent: 25 minutes.  Length of inpatient stay: 7 days  Signed: Carliss LELON Canales, DO [facility] 12/17/2023

## 2023-12-17 NOTE — Progress Notes (Signed)
 Subjective: The patient is alert and pleasant.  He is looking forward to going home today.  He complains that they are bring his pain medications as needed.  Objective: Vital signs in last 24 hours: Temp:  [97.8 F (36.6 C)-98.7 F (37.1 C)] 97.8 F (36.6 C) (10/30 0738) Pulse Rate:  [52-59] 55 (10/30 0738) Resp:  [16-20] 17 (10/30 0738) BP: (137-180)/(80-97) 171/90 (10/30 0738) SpO2:  [94 %-98 %] 94 % (10/30 0738) Estimated body mass index is 29.44 kg/m as calculated from the following:   Height as of this encounter: 6' 1 (1.854 m).   Weight as of this encounter: 101.2 kg.   Intake/Output from previous day: 10/29 0701 - 10/30 0700 In: -  Out: 800 [Urine:800] Intake/Output this shift: No intake/output data recorded.  Physical exam the patient is alert and oriented.  He is wearing his hard collar.  He is moving all 4 extremities well.  Lab Results: Recent Labs    12/15/23 0247  WBC 6.9  HGB 13.1  HCT 37.0*  PLT 168   BMET Recent Labs    12/15/23 0247 12/17/23 0138  NA 136 136  K 3.2* 3.0*  CL 98 97*  CO2 29 26  GLUCOSE 121* 113*  BUN 14 9  CREATININE 0.77 0.73  CALCIUM  8.7* 8.5*    Studies/Results: No results found.  Assessment/Plan: Jefferson fracture, C2 fracture, vertebral artery injury: I stressed the importance of limiting his neck motions, wearing his hard collar and avoiding further trauma.  Hopefully that these fractures will heal in a collar because the alternatives are not great, i.e. a halo or occipital cervical fusion.    I have asked him to either follow-up in my office or get a follow-up cervical CT at [facility] in about a week.  He will need to wear his hard collar for about 12 weeks.  I have answered all his questions.  LOS: 7 days     [name] 12/17/2023, 8:23 AM     Patient ID: [name], male   DOB: May 28, 1950, 73 y.o.   MRN: [id number]

## 2023-12-18 ENCOUNTER — Telehealth: Payer: Self-pay

## 2023-12-18 NOTE — Transitions of Care (Post Inpatient/ED Visit) (Signed)
 12/18/2023  Name: [name] MRN: [id number] DOB: 04-07-1950  Today's TOC FU Call Status: Today's TOC FU Call Status:: Successful TOC FU Call Completed TOC FU Call Complete Date: 12/18/23 Patient's Name and Date of Birth confirmed.  Transition Care Management Follow-up Telephone Call Discharge Facility: [facility]) Type of Discharge: Inpatient Admission Primary Inpatient Discharge Diagnosis:: neck fracture How have you been since you were released from the hospital?: Better Any questions or concerns?: No  Items Reviewed: Did you receive and understand the discharge instructions provided?: Yes Medications obtained,verified, and reconciled?: Yes (Medications Reviewed) Any new allergies since your discharge?: No Dietary orders reviewed?: Yes Do you have support at home?: Yes People in Home [RPT]: spouse  Medications Reviewed Today: Medications Reviewed Today     Reviewed by Emmitt Pan, LPN (Licensed Practical Nurse) on 12/18/23 at 1120  Med List Status: <None>   Medication Order Taking? Sig Documenting Provider Last Dose Status Informant  acetaminophen  (TYLENOL ) 500 MG tablet [id number] Yes Take 500-1,000 mg by mouth every 6 (six) hours as needed for moderate pain. [provider]  Active Self, Pharmacy Records  aspirin  325 MG tablet [id number] Yes Take 1 tablet (325 mg total) by mouth daily. Danton Reyes DASEN, MD  Active   diazepam (VALIUM) 5 MG tablet [id number] Yes Take 1 tablet (5 mg total) by mouth every 12 (twelve) hours as needed for muscle spasms or anxiety. Danton Reyes DASEN, MD  Active   ezetimibe (ZETIA) 10 MG tablet [id number] Yes TAKE ONE TABLET BY MOUTH ONCE DAILY Cook, Jayce G, DO  Active Self, Pharmacy Records  hydrochlorothiazide (MICROZIDE) 12.5 MG capsule [id number] Yes Take 1 capsule (12.5 mg total) by mouth daily. Cook, Jayce G, DO  Active Self, Pharmacy Records  lamoTRIgine  (LAMICTAL ) 200 MG tablet [id number] Yes Take 200 mg by mouth 2  (two) times daily.  [provider]  Active Self, Pharmacy Records  losartan  (COZAAR ) 50 MG tablet [id number] Yes Take 1 tablet (50 mg total) by mouth daily. Cook, Jayce G, DO  Active Self, Pharmacy Records  mirtazapine  (REMERON ) 45 MG tablet [id number] Yes Take 45 mg by mouth at bedtime. [provider]  Active Self, Pharmacy Records  oxyCODONE  (OXY IR/ROXICODONE ) 5 MG immediate release tablet [id number] Yes Take 1-2 tablets (5-10 mg total) by mouth every 4 (four) hours as needed for moderate pain (pain score 4-6). Danton Reyes DASEN, MD  Active   polyethylene glycol powder (GLYCOLAX /MIRALAX ) 17 GM/SCOOP powder [id number] Yes Dissolve 1 capful (17g) in 4-8 ounces of liquid and take by mouth 2 (two) times daily. Danton Reyes DASEN, MD  Active   potassium chloride  (KLOR-CON ) 20 MEQ packet [id number] Yes Take 20 mEq by mouth daily. Danton Reyes DASEN, MD  Active   QUEtiapine (SEROQUEL) 25 MG tablet [id number] Yes Take 25 mg by mouth at bedtime. [provider]  Active Self, Pharmacy Records            Home Care and Equipment/Supplies: Were Home Health Services Ordered?: Yes Name of Home Health Agency:: unknown Has Agency set up a time to come to your home?: Yes First Home Health Visit Date: 12/18/23 Any new equipment or medical supplies ordered?: No  Functional Questionnaire: Do you need assistance with bathing/showering or dressing?: Yes Do you need assistance with meal preparation?: Yes Do you need assistance with eating?: No Do you have difficulty maintaining continence: No Do you need assistance with getting out of bed/getting out of a chair/moving?: No Do you have difficulty managing or  taking your medications?: No  Follow up appointments reviewed: PCP Follow-up appointment confirmed?: Yes Date of PCP follow-up appointment?: 12/23/23 Follow-up Provider: [facility] Follow-up appointment confirmed?: No Reason Specialist Follow-Up Not Confirmed:  Patient has Specialist Provider Number and will Call for Appointment Do you need transportation to your follow-up appointment?: No Do you understand care options if your condition(s) worsen?: Yes-patient verbalized understanding    SIGNATURE Julian Lemmings, LPN [facility] Nurse Health Advisor Direct Dial [phone]

## 2023-12-21 ENCOUNTER — Encounter: Payer: Self-pay | Admitting: Radiology

## 2023-12-23 ENCOUNTER — Ambulatory Visit (HOSPITAL_COMMUNITY)

## 2023-12-23 ENCOUNTER — Ambulatory Visit (INDEPENDENT_AMBULATORY_CARE_PROVIDER_SITE_OTHER): Payer: Self-pay | Admitting: Family Medicine

## 2023-12-23 ENCOUNTER — Encounter: Payer: Self-pay | Admitting: Family Medicine

## 2023-12-23 VITALS — BP 94/62 | HR 79 | Ht 72.0 in | Wt 215.0 lb

## 2023-12-23 DIAGNOSIS — S1201XD Stable burst fracture of first cervical vertebra, subsequent encounter for fracture with routine healing: Secondary | ICD-10-CM | POA: Diagnosis not present

## 2023-12-23 MED ORDER — OXYCODONE HCL 5 MG PO TABS: 5.0000 mg | ORAL_TABLET | ORAL | 0 refills | Status: DC | PRN

## 2023-12-23 NOTE — Patient Instructions (Signed)
 Medication as prescribed.  Follow up with neurosurgery.  Take care  Dr. Bluford

## 2023-12-28 ENCOUNTER — Ambulatory Visit (HOSPITAL_COMMUNITY)

## 2023-12-28 DIAGNOSIS — S1201XA Stable burst fracture of first cervical vertebra, initial encounter for closed fracture: Secondary | ICD-10-CM | POA: Insufficient documentation

## 2023-12-28 NOTE — Progress Notes (Signed)
 Subjective:  Patient ID: [name], male    DOB: 03-02-1950  Age: 73 y.o. MRN: [id number]  CC:   Chief Complaint  Patient presents with   Hospitalization Follow-up   food stuck    Food getting stuck when swallowing    mucus    Coughing up mucus     HPI:  73 year old male presents for hospital follow up.  Recent hospital admission. Hospital course, imaging, labs, notes, and discharge summary reviewed.   In summary: Patient suffered a mechanical fall of his deck on 10/23. In the ER, CT revealed a Jefferson fracture of C1 as well as mildly displaced fractures of the superior and inferior facets of C2.  CTa of the head noted only irregularity of the right V3 vertebral artery with intramural filling defect compatible with acute arterial injury with intraluminal thrombus and/or dissection. His case was discussed with Neurosurgery and the decision was made to transfer him to [facility] for definitive evaluation.  Neurosurgery advised hard C collar and close outpatient follow up.  Patient reports that he is doing okay. Still having significant pain. Reports he is having difficulty with a lot of solid foods (getting stuck). I feel that this is likely from the collar but if continues will need swallow study. Advised to discuss with neurosurgery.   Patient Active Problem List   Diagnosis Date Noted   Jefferson fracture (HCC) 12/28/2023   Dyslipidemia 12/11/2023   Anxiety and depression 12/11/2023   Closed cervical spine fracture (HCC) 12/10/2023   Lower extremity edema 09/20/2023   History of gastric ulcer 08/31/2023   S/P arthroscopy of right knee 09/09/22 09/11/2022   Primary osteoarthritis of right knee 09/09/2022   CAD (coronary artery disease) 07/21/2022   Prediabetes 08/12/2021   Sleep apnea 08/12/2021   Multiple thyroid  nodules 08/12/2021   Anxiety 06/13/2021   History of stroke 06/12/2021   Hyperlipidemia 06/12/2021   Thoracic aortic aneurysm without rupture  11/16/2018   Severe recurrent major depression without psychotic features (HCC) 06/20/2016   Benzodiazepine abuse in remission (HCC) 03/28/2016   Essential hypertension 06/07/2008    Social Hx   Social History   Socioeconomic History   Marital status: Married    Spouse name: Darice   Number of children: 3   Years of education: Not on file   Highest education level: Not on file  Occupational History   Not on file  Tobacco Use   Smoking status: Former    Current packs/day: 0.00    Average packs/day: 3.0 packs/day for 35.0 years (105.0 ttl pk-yrs)    Types: Cigarettes    Start date: 02/17/1957    Quit date: 02/18/1992    Years since quitting: 31.8   Smokeless tobacco: Never   Tobacco comments:    no smoking in 24 yrs,   Vaping Use   Vaping status: Never Used  Substance and Sexual Activity   Alcohol use: Yes    Alcohol/week: 4.0 standard drinks of alcohol    Types: 4 Shots of liquor per week    Comment: No ETOH since 02/2016; previously 2-4 drinks vodka 2 times week   Drug use: No   Sexual activity: Not Currently  Other Topics Concern   Not on file  Social History Narrative   3 biological children and 2 step children.   Social Drivers of Health   Financial Resource Strain: Low Risk  (10/23/2023)   Overall Financial Resource Strain (CARDIA)    Difficulty of Paying Living Expenses:  Not hard at all  Food Insecurity: No Food Insecurity (12/12/2023)   Hunger Vital Sign    Worried About Running Out of Food in the Last Year: Never true    Ran Out of Food in the Last Year: Never true  Transportation Needs: No Transportation Needs (12/12/2023)   PRAPARE - Administrator, Civil Service (Medical): No    Lack of Transportation (Non-Medical): No  Physical Activity: Sufficiently Active (10/23/2023)   Exercise Vital Sign    Days of Exercise per Week: 7 days    Minutes of Exercise per Session: 30 min  Stress: Stress Concern Present (10/23/2023)   [employer] of  Occupational Health - Occupational Stress Questionnaire    Feeling of Stress: To some extent  Social Connections: Moderately Isolated (12/12/2023)   Social Connection and Isolation Panel    Frequency of Communication with Friends and Family: More than three times a week    Frequency of Social Gatherings with Friends and Family: More than three times a week    Attends Religious Services: Never    Database Administrator or Organizations: No    Attends Banker Meetings: Never    Marital Status: Married    Review of Systems Per HPI  Objective:  BP 94/62   Pulse 79   Ht 6' (1.829 m)   Wt 215 lb (97.5 kg)   SpO2 96%   BMI 29.16 kg/m      12/23/2023    1:33 PM 12/17/2023   12:19 PM 12/17/2023    7:38 AM  BP/Weight  Systolic BP 94 171 171  Diastolic BP 62 98 90  Wt. (Lbs) 215    BMI 29.16 kg/m2      Physical Exam Vitals and nursing note reviewed.  Constitutional:      General: He is not in acute distress. HENT:     Head: Normocephalic and atraumatic.  Eyes:     General:        Right eye: No discharge.        Left eye: No discharge.     Conjunctiva/sclera: Conjunctivae normal.  Cardiovascular:     Rate and Rhythm: Normal rate and regular rhythm.  Pulmonary:     Effort: Pulmonary effort is normal.     Breath sounds: Normal breath sounds. No wheezing or rales.  Neurological:     Mental Status: He is alert.     Lab Results  Component Value Date   WBC 6.9 12/15/2023   HGB 13.1 12/15/2023   HCT 37.0 (L) 12/15/2023   PLT 168 12/15/2023   GLUCOSE 113 (H) 12/17/2023   CHOL 187 08/31/2023   TRIG 128 08/31/2023   HDL 82 08/31/2023   LDLCALC 83 08/31/2023   ALT 26 09/14/2023   AST 31 09/14/2023   NA 136 12/17/2023   K 3.0 (L) 12/17/2023   CL 97 (L) 12/17/2023   CREATININE 0.73 12/17/2023   BUN 9 12/17/2023   CO2 26 12/17/2023   TSH 1.518 06/12/2016   INR 1.0 12/10/2023   HGBA1C 5.4 08/31/2023     Assessment & Plan:  Closed Jefferson fracture  with routine healing, subsequent encounter Assessment & Plan: Stable at this time. Hospital course reviewed. Has follow up with neurosurgery. Pain medication refilled.  Orders: -     oxyCODONE  HCl; Take 1-2 tablets (5-10 mg total) by mouth every 4 (four) hours as needed for moderate pain (pain score 4-6).  Dispense: 30 tablet; Refill: 0  Follow-up:  1 month  Yasheka Fossett DO [facility]

## 2023-12-28 NOTE — Assessment & Plan Note (Signed)
 Stable at this time. Hospital course reviewed. Has follow up with neurosurgery. Pain medication refilled.

## 2023-12-29 ENCOUNTER — Ambulatory Visit (HOSPITAL_COMMUNITY): Admit: 2023-12-29 | Admitting: Orthopedic Surgery

## 2023-12-29 SURGERY — RELEASE, A1 PULLEY, FOR TRIGGER FINGER
Anesthesia: LOCAL | Laterality: Left

## 2024-01-12 ENCOUNTER — Ambulatory Visit

## 2024-01-12 ENCOUNTER — Ambulatory Visit: Admitting: Thoracic Surgery (Cardiothoracic Vascular Surgery)
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
## 2024-02-22 ENCOUNTER — Ambulatory Visit: Admitting: Cardiovascular Disease

## 2024-03-02 ENCOUNTER — Encounter: Payer: Self-pay | Admitting: Family Medicine

## 2024-03-02 ENCOUNTER — Ambulatory Visit: Admitting: Family Medicine

## 2024-03-02 VITALS — BP 138/77 | HR 61 | Temp 97.9°F | Ht 72.0 in | Wt 210.0 lb

## 2024-03-02 DIAGNOSIS — M62838 Other muscle spasm: Secondary | ICD-10-CM | POA: Diagnosis not present

## 2024-03-02 DIAGNOSIS — I10 Essential (primary) hypertension: Secondary | ICD-10-CM | POA: Diagnosis not present

## 2024-03-02 MED ORDER — BACLOFEN 10 MG PO TABS: 5.0000 mg | ORAL_TABLET | Freq: Three times a day (TID) | ORAL | 0 refills | Status: AC | PRN

## 2024-03-02 NOTE — Patient Instructions (Signed)
Medication as prescribed.  Follow up in 3-6 months. 

## 2024-03-03 ENCOUNTER — Telehealth: Payer: Self-pay

## 2024-03-03 DIAGNOSIS — M62838 Other muscle spasm: Secondary | ICD-10-CM | POA: Insufficient documentation

## 2024-03-03 NOTE — Progress Notes (Signed)
 "  Subjective:  Patient ID: [name], male    DOB: [birth year]  Age: 74 y.o. MRN: [id number]  CC:  Follow up   HPI:  74 year old male presents for follow-up.  He is now out of his cervical collar regarding Jefferson's fracture.  Following closely with neurosurgery.  Overall he feels like he is doing well at this time.  He would like something to help with muscle spasms in the neck.  Previously prescribed Valium .  Would like to avoid this.  Patient wife is worsening and this is quite difficult and challenging.  She is under hospice care.  Blood pressure is well-controlled.  Patient Active Problem List   Diagnosis Date Noted   Muscle spasm 03/03/2024   Jefferson fracture [facility]) 12/28/2023   Dyslipidemia 12/11/2023   Anxiety and depression 12/11/2023   Lower extremity edema 09/20/2023   History of gastric ulcer 08/31/2023   S/P arthroscopy of right knee 09/09/22 09/11/2022   Primary osteoarthritis of right knee 09/09/2022   CAD (coronary artery disease) 07/21/2022   Prediabetes 08/12/2021   Sleep apnea 08/12/2021   Multiple thyroid  nodules 08/12/2021   Anxiety 06/13/2021   History of stroke 06/12/2021   Hyperlipidemia 06/12/2021   Thoracic aortic aneurysm without rupture 11/16/2018   Severe recurrent major depression without psychotic features (HCC) 06/20/2016   Benzodiazepine abuse in remission (HCC) 03/28/2016   Essential hypertension 06/07/2008    Social Hx   Social History   Socioeconomic History   Marital status: Married    Spouse name: Darice   Number of children: 3   Years of education: Not on file   Highest education level: Not on file  Occupational History   Not on file  Tobacco Use   Smoking status: Former    Current packs/day: 0.00    Average packs/day: 3.0 packs/day for 35.0 years (105.0 ttl pk-yrs)    Types: Cigarettes    Start date: 02/17/1957    Quit date: 02/18/1992    Years since quitting: 32.0   Smokeless tobacco: Never   Tobacco comments:     no smoking in 24 yrs,   Vaping Use   Vaping status: Never Used  Substance and Sexual Activity   Alcohol use: Yes    Alcohol/week: 4.0 standard drinks of alcohol    Types: 4 Shots of liquor per week    Comment: No ETOH since 02/2016; previously 2-4 drinks vodka 2 times week   Drug use: No   Sexual activity: Not Currently  Other Topics Concern   Not on file  Social History Narrative   3 biological children and 2 step children.   Social Drivers of Health   Tobacco Use: Medium Risk (03/02/2024)   Patient History    Smoking Tobacco Use: Former    Smokeless Tobacco Use: Never    Passive Exposure: Not on file  Financial Resource Strain: Low Risk (10/23/2023)   Overall Financial Resource Strain (CARDIA)    Difficulty of Paying Living Expenses: Not hard at all  Food Insecurity: No Food Insecurity (12/12/2023)   Epic    Worried About Programme Researcher, Broadcasting/film/video in the Last Year: Never true    Ran Out of Food in the Last Year: Never true  Transportation Needs: No Transportation Needs (12/12/2023)   Epic    Lack of Transportation (Medical): No    Lack of Transportation (Non-Medical): No  Physical Activity: Sufficiently Active (10/23/2023)   Exercise Vital Sign    Days of Exercise per Week: 7 days  Minutes of Exercise per Session: 30 min  Stress: Stress Concern Present (10/23/2023)   [employer] of Occupational Health - Occupational Stress Questionnaire    Feeling of Stress: To some extent  Social Connections: Moderately Isolated (12/12/2023)   Social Connection and Isolation Panel    Frequency of Communication with Friends and Family: More than three times a week    Frequency of Social Gatherings with Friends and Family: More than three times a week    Attends Religious Services: Never    Database Administrator or Organizations: No    Attends Banker Meetings: Never    Marital Status: Married  Depression (PHQ2-9): High Risk (03/02/2024)   Depression (PHQ2-9)    PHQ-2  Score: 19  Alcohol Screen: Low Risk (10/23/2023)   Alcohol Screen    Last Alcohol Screening Score (AUDIT): 0  Housing: Low Risk (12/12/2023)   Epic    Unable to Pay for Housing in the Last Year: No    Number of Times Moved in the Last Year: 0    Homeless in the Last Year: No  Utilities: Not At Risk (12/12/2023)   Epic    Threatened with loss of utilities: No  Health Literacy: Adequate Health Literacy (10/23/2023)   B1300 Health Literacy    Frequency of need for help with medical instructions: Never    Review of Systems Per HPI  Objective:  BP 138/77   Pulse 61   Temp 97.9 F (36.6 C)   Ht 6' (1.829 m)   Wt 210 lb (95.3 kg)   SpO2 95%   BMI 28.48 kg/m      03/02/2024    2:16 PM 12/23/2023    1:33 PM 12/17/2023   12:19 PM  BP/Weight  Systolic BP 138 94 171  Diastolic BP 77 62 98  Wt. (Lbs) 210 215   BMI 28.48 kg/m2 29.16 kg/m2     Physical Exam Vitals and nursing note reviewed.  Constitutional:      General: He is not in acute distress.    Appearance: Normal appearance.  HENT:     Head: Normocephalic and atraumatic.  Cardiovascular:     Rate and Rhythm: Normal rate and regular rhythm.  Pulmonary:     Effort: Pulmonary effort is normal.     Breath sounds: Normal breath sounds. No wheezing, rhonchi or rales.  Neurological:     Mental Status: He is alert.     Lab Results  Component Value Date   WBC 6.9 12/15/2023   HGB 13.1 12/15/2023   HCT 37.0 (L) 12/15/2023   PLT 168 12/15/2023   GLUCOSE 113 (H) 12/17/2023   CHOL 187 08/31/2023   TRIG 128 08/31/2023   HDL 82 08/31/2023   LDLCALC 83 08/31/2023   ALT 26 09/14/2023   AST 31 09/14/2023   NA 136 12/17/2023   K 3.0 (L) 12/17/2023   CL 97 (L) 12/17/2023   CREATININE 0.73 12/17/2023   BUN 9 12/17/2023   CO2 26 12/17/2023   TSH 1.518 06/12/2016   INR 1.0 12/10/2023   HGBA1C 5.4 08/31/2023     Assessment & Plan:  Essential hypertension Assessment & Plan: Stable. Continue losartan  and  HCTZ.   Muscle spasm Assessment & Plan: Baclofen  as directed.  Orders: -     Baclofen ; Take 0.5-1 tablets (5-10 mg total) by mouth 3 (three) times daily as needed for muscle spasms.  Dispense: 30 each; Refill: 0    Follow-up:  3-6 months  Danell Verno  DO [facility] "

## 2024-03-03 NOTE — Assessment & Plan Note (Signed)
Stable.  Continue losartan and HCTZ. ?

## 2024-03-03 NOTE — Assessment & Plan Note (Signed)
Baclofen as directed.

## 2024-03-04 NOTE — Telephone Encounter (Signed)
 Error

## 2024-03-21 ENCOUNTER — Other Ambulatory Visit: Payer: Self-pay | Admitting: Family Medicine

## 2024-03-21 ENCOUNTER — Encounter: Payer: Self-pay | Admitting: Family Medicine

## 2024-03-21 MED ORDER — NAPROXEN 500 MG PO TABS: 500.0000 mg | ORAL_TABLET | Freq: Two times a day (BID) | ORAL | 1 refills | Status: AC | PRN

## 2024-07-01 ENCOUNTER — Ambulatory Visit: Admitting: Cardiovascular Disease

## 2024-08-30 ENCOUNTER — Ambulatory Visit: Admitting: Family Medicine

## 2024-10-28 ENCOUNTER — Ambulatory Visit
